# Patient Record
Sex: Female | Born: 1935 | Race: White | Hispanic: No | Smoking: Never smoker
Health system: Southern US, Community
[De-identification: ages and names within clinical notes are randomized; demographics above are authoritative.]

## PROBLEM LIST (undated history)

## (undated) DIAGNOSIS — R319 Hematuria, unspecified: Secondary | ICD-10-CM

## (undated) DIAGNOSIS — R2689 Other abnormalities of gait and mobility: Secondary | ICD-10-CM

## (undated) DIAGNOSIS — E876 Hypokalemia: Secondary | ICD-10-CM

## (undated) DIAGNOSIS — M1990 Unspecified osteoarthritis, unspecified site: Secondary | ICD-10-CM

## (undated) DIAGNOSIS — I5032 Chronic diastolic (congestive) heart failure: Secondary | ICD-10-CM

## (undated) DIAGNOSIS — K219 Gastro-esophageal reflux disease without esophagitis: Secondary | ICD-10-CM

## (undated) DIAGNOSIS — H18009 Unspecified corneal deposit, unspecified eye: Secondary | ICD-10-CM

## (undated) DIAGNOSIS — H1820 Unspecified corneal edema: Secondary | ICD-10-CM

## (undated) DIAGNOSIS — K449 Diaphragmatic hernia without obstruction or gangrene: Secondary | ICD-10-CM

## (undated) DIAGNOSIS — Z961 Presence of intraocular lens: Secondary | ICD-10-CM

## (undated) DIAGNOSIS — I499 Cardiac arrhythmia, unspecified: Secondary | ICD-10-CM

## (undated) DIAGNOSIS — H9190 Unspecified hearing loss, unspecified ear: Secondary | ICD-10-CM

## (undated) DIAGNOSIS — I739 Peripheral vascular disease, unspecified: Secondary | ICD-10-CM

## (undated) DIAGNOSIS — I10 Essential (primary) hypertension: Secondary | ICD-10-CM

## (undated) DIAGNOSIS — M25473 Effusion, unspecified ankle: Secondary | ICD-10-CM

## (undated) DIAGNOSIS — I4821 Permanent atrial fibrillation: Secondary | ICD-10-CM

## (undated) DIAGNOSIS — F419 Anxiety disorder, unspecified: Secondary | ICD-10-CM

## (undated) DIAGNOSIS — Z853 Personal history of malignant neoplasm of breast: Secondary | ICD-10-CM

## (undated) DIAGNOSIS — D500 Iron deficiency anemia secondary to blood loss (chronic): Secondary | ICD-10-CM

## (undated) DIAGNOSIS — R55 Syncope and collapse: Secondary | ICD-10-CM

## (undated) DIAGNOSIS — E785 Hyperlipidemia, unspecified: Secondary | ICD-10-CM

## (undated) DIAGNOSIS — R739 Hyperglycemia, unspecified: Secondary | ICD-10-CM

## (undated) HISTORY — DX: Unspecified corneal edema: H18.20

## (undated) HISTORY — PX: CHOLECYSTECTOMY: SHX55

## (undated) HISTORY — DX: Presence of intraocular lens: Z96.1

## (undated) HISTORY — PX: EYE SURGERY: SHX253

## (undated) HISTORY — PX: TONSILLECTOMY: SUR1361

## (undated) HISTORY — DX: Essential (primary) hypertension: I10

## (undated) HISTORY — PX: ABDOMINAL HYSTERECTOMY: SHX81

## (undated) HISTORY — DX: Unspecified corneal deposit, unspecified eye: H18.009

## (undated) HISTORY — DX: Other abnormalities of gait and mobility: R26.89

## (undated) HISTORY — DX: Hyperglycemia, unspecified: R73.9

## (undated) HISTORY — DX: Hypokalemia: E87.6

## (undated) HISTORY — DX: Diaphragmatic hernia without obstruction or gangrene: K44.9

## (undated) HISTORY — DX: Hyperlipidemia, unspecified: E78.5

## (undated) HISTORY — PX: HEMORRHOID SURGERY: SHX153

## (undated) HISTORY — DX: Anxiety disorder, unspecified: F41.9

## (undated) HISTORY — DX: Syncope and collapse: R55

## (undated) HISTORY — PX: OTHER SURGICAL HISTORY: SHX169

## (undated) HISTORY — DX: Gastro-esophageal reflux disease without esophagitis: K21.9

## (undated) HISTORY — DX: Effusion, unspecified ankle: M25.473

## (undated) HISTORY — DX: Permanent atrial fibrillation: I48.21

## (undated) HISTORY — PX: BREAST BIOPSY: SHX20

## (undated) HISTORY — DX: Iron deficiency anemia secondary to blood loss (chronic): D50.0

## (undated) HISTORY — DX: Unspecified osteoarthritis, unspecified site: M19.90

## (undated) HISTORY — DX: Chronic diastolic (congestive) heart failure: I50.32

## (undated) HISTORY — DX: Hematuria, unspecified: R31.9

---

## 2009-05-03 ENCOUNTER — Observation Stay (HOSPITAL_COMMUNITY): Admission: EM | Admit: 2009-05-03 | Discharge: 2009-05-04 | Payer: Self-pay | Admitting: Emergency Medicine

## 2009-05-03 HISTORY — PX: CATARACT EXTRACTION: SUR2

## 2010-08-23 ENCOUNTER — Encounter (HOSPITAL_COMMUNITY)
Admission: RE | Admit: 2010-08-23 | Discharge: 2010-08-23 | Disposition: A | Discharge status: Home / Self Care | Payer: Medicare Other | Source: Ambulatory Visit | Attending: Orthopaedic Surgery | Admitting: Orthopaedic Surgery

## 2010-08-23 ENCOUNTER — Ambulatory Visit (HOSPITAL_COMMUNITY)
Admission: RE | Admit: 2010-08-23 | Discharge: 2010-08-23 | Disposition: A | Discharge status: Home / Self Care | Payer: Medicare Other | Source: Ambulatory Visit | Attending: Orthopaedic Surgery | Admitting: Orthopaedic Surgery

## 2010-08-23 ENCOUNTER — Other Ambulatory Visit (HOSPITAL_COMMUNITY): Payer: Self-pay | Admitting: Orthopaedic Surgery

## 2010-08-23 DIAGNOSIS — I10 Essential (primary) hypertension: Secondary | ICD-10-CM | POA: Insufficient documentation

## 2010-08-23 DIAGNOSIS — Z01812 Encounter for preprocedural laboratory examination: Secondary | ICD-10-CM | POA: Insufficient documentation

## 2010-08-23 DIAGNOSIS — M1711 Unilateral primary osteoarthritis, right knee: Secondary | ICD-10-CM

## 2010-08-23 DIAGNOSIS — Z01818 Encounter for other preprocedural examination: Secondary | ICD-10-CM | POA: Insufficient documentation

## 2010-08-23 DIAGNOSIS — K449 Diaphragmatic hernia without obstruction or gangrene: Secondary | ICD-10-CM | POA: Insufficient documentation

## 2010-08-23 LAB — COMPREHENSIVE METABOLIC PANEL
ALT: 18 U/L (ref 0–35)
Albumin: 3.7 g/dL (ref 3.5–5.2)
Alkaline Phosphatase: 87 U/L (ref 39–117)
BUN: 20 mg/dL (ref 6–23)
Chloride: 102 mEq/L (ref 96–112)
Glucose, Bld: 91 mg/dL (ref 70–99)
Potassium: 4.1 mEq/L (ref 3.5–5.1)
Sodium: 137 mEq/L (ref 135–145)
Total Bilirubin: 0.7 mg/dL (ref 0.3–1.2)
Total Protein: 6.4 g/dL (ref 6.0–8.3)

## 2010-08-23 LAB — CBC
HCT: 41.4 % (ref 36.0–46.0)
Hemoglobin: 14 g/dL (ref 12.0–15.0)
MCH: 29.5 pg (ref 26.0–34.0)
MCV: 87.2 fL (ref 78.0–100.0)
RBC: 4.75 MIL/uL (ref 3.87–5.11)
WBC: 7.1 10*3/uL (ref 4.0–10.5)

## 2010-08-23 LAB — URINALYSIS, ROUTINE W REFLEX MICROSCOPIC
Bilirubin Urine: NEGATIVE
Glucose, UA: NEGATIVE mg/dL
Ketones, ur: NEGATIVE mg/dL
Nitrite: NEGATIVE
Specific Gravity, Urine: 1.018 (ref 1.005–1.030)
pH: 5.5 (ref 5.0–8.0)

## 2010-08-23 LAB — PROTIME-INR
INR: 0.9 (ref 0.00–1.49)
Prothrombin Time: 12.4 seconds (ref 11.6–15.2)

## 2010-08-23 LAB — TYPE AND SCREEN: Antibody Screen: NEGATIVE

## 2010-08-23 LAB — SURGICAL PCR SCREEN
MRSA, PCR: NEGATIVE
Staphylococcus aureus: POSITIVE — AB

## 2010-08-26 ENCOUNTER — Inpatient Hospital Stay (HOSPITAL_COMMUNITY)
Admission: RE | Admit: 2010-08-26 | Discharge: 2010-08-30 | DRG: 470 | Disposition: A | Discharge status: Skilled Nursing Facility | Payer: Medicare Other | Source: Ambulatory Visit | Attending: Orthopaedic Surgery | Admitting: Orthopaedic Surgery

## 2010-08-26 ENCOUNTER — Inpatient Hospital Stay (HOSPITAL_COMMUNITY): Payer: Medicare Other

## 2010-08-26 DIAGNOSIS — F411 Generalized anxiety disorder: Secondary | ICD-10-CM | POA: Diagnosis present

## 2010-08-26 DIAGNOSIS — M1710 Unilateral primary osteoarthritis, unspecified knee: Principal | ICD-10-CM | POA: Diagnosis present

## 2010-08-26 DIAGNOSIS — K219 Gastro-esophageal reflux disease without esophagitis: Secondary | ICD-10-CM | POA: Diagnosis present

## 2010-08-26 DIAGNOSIS — Z7901 Long term (current) use of anticoagulants: Secondary | ICD-10-CM

## 2010-08-26 DIAGNOSIS — I10 Essential (primary) hypertension: Secondary | ICD-10-CM | POA: Diagnosis present

## 2010-08-26 DIAGNOSIS — I4891 Unspecified atrial fibrillation: Secondary | ICD-10-CM | POA: Diagnosis present

## 2010-08-26 HISTORY — PX: TOTAL KNEE ARTHROPLASTY: SHX125

## 2010-08-27 LAB — CBC
MCHC: 33.1 g/dL (ref 30.0–36.0)
RDW: 14.5 % (ref 11.5–15.5)
WBC: 8.6 10*3/uL (ref 4.0–10.5)

## 2010-08-27 LAB — PROTIME-INR
INR: 1.15 (ref 0.00–1.49)
Prothrombin Time: 14.9 seconds (ref 11.6–15.2)

## 2010-08-27 LAB — BASIC METABOLIC PANEL
CO2: 24 mEq/L (ref 19–32)
Chloride: 103 mEq/L (ref 96–112)
GFR calc Af Amer: 54 mL/min — ABNORMAL LOW (ref >60)
Glucose, Bld: 140 mg/dL — ABNORMAL HIGH (ref 70–99)
Potassium: 3.7 mEq/L (ref 3.5–5.1)
Sodium: 133 mEq/L — ABNORMAL LOW (ref 135–145)

## 2010-08-28 LAB — CBC
HCT: 27.3 % — ABNORMAL LOW (ref 36.0–46.0)
Hemoglobin: 9.1 g/dL — ABNORMAL LOW (ref 12.0–15.0)
MCH: 29 pg (ref 26.0–34.0)
MCHC: 33.3 g/dL (ref 30.0–36.0)
MCV: 86.9 fL (ref 78.0–100.0)

## 2010-08-29 LAB — BASIC METABOLIC PANEL
BUN: 15 mg/dL (ref 6–23)
Chloride: 99 mEq/L (ref 96–112)
Glucose, Bld: 108 mg/dL — ABNORMAL HIGH (ref 70–99)
Potassium: 3.4 mEq/L — ABNORMAL LOW (ref 3.5–5.1)
Sodium: 132 mEq/L — ABNORMAL LOW (ref 135–145)

## 2010-08-29 LAB — CBC
HCT: 26.8 % — ABNORMAL LOW (ref 36.0–46.0)
MCV: 87.3 fL (ref 78.0–100.0)
Platelets: 209 10*3/uL (ref 150–400)
RBC: 3.07 MIL/uL — ABNORMAL LOW (ref 3.87–5.11)
WBC: 6.1 10*3/uL (ref 4.0–10.5)

## 2010-08-29 LAB — PROTIME-INR: INR: 1.97 — ABNORMAL HIGH (ref 0.00–1.49)

## 2010-08-30 LAB — PROTIME-INR: Prothrombin Time: 20.4 seconds — ABNORMAL HIGH (ref 11.6–15.2)

## 2010-09-13 NOTE — Discharge Summary (Signed)
Sara [name], Sara [name]                 ACCOUNT NO.:  [id number]  MEDICAL RECORD NO.:  [id number]           PATIENT TYPE:  I  LOCATION:  5010                         FACILITY:  [facility]  PHYSICIAN:  Jamear Carbonneau C. Ophelia Charter, M.D.    DATE OF BIRTH:  [birth year]  DATE OF ADMISSION:  08/26/2010 DATE OF DISCHARGE:  08/30/2010                              DISCHARGE SUMMARY   ADMISSION DIAGNOSES: 1. Right knee osteoarthritis. 2. History of atrial fibrillation. 3. Hypertension. 4. Anxiety. 5. Gastroesophageal reflux disease.  DISCHARGE DIAGNOSES: 1. Right knee osteoarthritis. 2. History of atrial fibrillation. 3. Hypertension. 4. Anxiety. 5. Gastroesophageal reflux disease. 6. Posthemorrhagic anemia.  PROCEDURE:  On August 26, 2010, the patient underwent right total knee arthroplasty, cemented and computer assisted performed by [employer] C. Ophelia Charter, MD, assisted by Wende Neighbors, [facility] under general anesthesia with femoral nerve block.  CONSULTATIONS:  None.  BRIEF HISTORY:  The patient is an 77 year old female with chronic and progressive right knee pain causing inability to ambulate.  Radiographs have demonstrated significant osteoarthritis.  She has undergone an MRI scan of the right knee showing advanced degenerative disease appearing worst in the anterior aspects of the medial and lateral compartment with extensive subchondral edema.  This scan was in December 2011.  She has been treated conservatively with anti-inflammatory medications as well as analgesics.  Attempts at conservative treatment no longer gave her relief of her symptoms.  It was felt she would benefit from surgical intervention in the form of a total knee arthroplasty and was admitted for the procedure as stated above.  The patient had known cardiac history and therefore was seen by her cardiologist preoperatively for clearance.  Dr. Clarene Duke saw the patient preoperatively and felt her to be a low to moderate risk cardiac patient.   She underwent a preoperative stress test showing her to be a low risk patient.  BRIEF HOSPITAL COURSE:  The patient was admitted and underwent the procedure as stated above.  She tolerated the procedure under general anesthesia without complications.  Femoral nerve block was utilized for pain control.  She was placed on PCA morphine initially; however, due to becoming quite lethargic, she was weaned to p.o. analgesics.  She also developed nausea that was present through much of the hospital stay and treated with multiple antiemetics.  She was on Nexium at bedtime.  The patient was placed on Percocet, which continued to cause her somnolence as well as nausea and then was switched to Vicodin, which she seemed to tolerate better.  The patient was started on physical therapy for range of motion, stretching and strengthening exercises of the right knee. CPM was utilized for passive range of motion.  She was able to participate with the physical therapy and the exercises as well as ambulation.  She was slow to progress with ambulation and was felt to be a suitable candidate for inpatient rehabilitation.  She did prefer to go to [facility] for rehabilitation and the case managers assisted in the necessary paperwork for the transfer.  Prior to discharge, the patient was ambulating approximately 6 feet with the physical therapist.  She was weightbearing as tolerated on the operative extremity and did require a walker for ambulation.  Knee immobilizer used for ambulation only.  Dressing changes were done daily, and the patient's wound was healing without drainage or other signs of infection.  The patient was placed on Lovenox and Coumadin postoperatively.  As her INR became therapeutic, her Lovenox was discontinued.  The adjustments in Coumadin dose were made according to the pro times by the pharmacist at [facility].  The patient was able to take a regular diet despite the fact  that she had nausea.  The antiemetics did seem to help as did the Nexium.  The patient was afebrile and vital signs were stable.  She was voiding well after discontinuation of her Foley catheter.  PERTINENT LABORATORY VALUES:  At discharge, INR 1.73.  Hemoglobin and hematocrit 8.8 and 26.8 respectively.  Chemistry studies on August 29, 2010 showed sodium 132, potassium 3.4, chloride 99, CO2 of 27, glucose 108, BUN 15, creatinine 0.87, and calcium 8.1.  PLAN:  On August 30, 2010, the patient was felt stable for transfer to [facility] for continuation of her orthopedic care and physical therapy.  She should continue to receive physical therapy on a daily basis for passive range of motion, active range of motion, strengthening and stretching exercises to the right knee.  She will continue to receive ambulation and gait training for weightbearing as tolerated to the right lower extremity utilizing a walker for ambulation and her knee immobilizer for ambulation.  The knee immobilizer will be discontinued when the patient can do straight leg raise.  She should receive dressing changes on an as needed or daily basis.  Tegaderm should be utilized over the knee with a 4 x 4 and avoidance of tape products, if possible.  Occupational therapy to assist with ADLs as the patient does plan to be discharged to her home on an independent basis when stable.  She will continue on a regular diet.  MEDICATIONS AT DISCHARGE:  Coumadin, which will be continued by the Pharmacy and physicians at the nursing facility.  She will be on Coumadin for a total of 4 weeks for DVT prophylaxis.  She was not on anticoagulant prior to surgery for her AFib, but other agents were utilized.  Other pertinent medications: 1. Senokot 1/2 hour before meals b.i.d. 2. Sodium chloride ophthalmic drops each eye b.i.d. 3. Benicar 20 mg daily. 4. Coreg 6.25 mg at bedtime. 5. Hydrochlorothiazide 25 mg daily. 6.  Hydralazine 50 mg daily. 7. Amiodarone 100 mg daily. 8. Nexium 40 mg p.o. at bedtime. 9. MiraLax 17 g daily in 8 ounces of water. 10.Tylenol 650 mg 1 every 4-6 hours as needed for mild pain. 11.Vicodin 1-2 every 4-6 hours as needed for moderate pain. 12.Robaxin 500 mg 1 every 8 hours as needed for spasm. 13.Tranxene 3.75 mg 1 p.o. q.8 h p.r.n. anxiety.  The patient should follow up with Dr. Ophelia Charter' office 2 weeks from the date of surgery.  If there are questions or concerns regarding her orthopedic status, please notify Dr. Ophelia Charter' office at [phone].  CONDITION ON DISCHARGE:  Stable.     Wende Neighbors, P.A.   ______________________________ Veverly Fells Ophelia Charter, M.D.    SMV/MEDQ  D:  08/30/2010  T:  08/30/2010  Job:  [id number]  Electronically Signed by Maud Deed P.A. on 09/02/2010 02:05:01 PM Electronically Signed by Annell Greening M.D. on 09/13/2010 05:58:32 PM

## 2010-09-13 NOTE — Op Note (Signed)
Sara [name], Sara [name]                 ACCOUNT NO.:  [id number]  MEDICAL RECORD NO.:  [id number]           PATIENT TYPE:  I  LOCATION:  5010                         FACILITY:  [facility]  PHYSICIAN:  Miranda Frese C. [name], M.D.    DATE OF BIRTH:  March 29, 1926  DATE OF PROCEDURE:  08/26/2010 DATE OF DISCHARGE:                              OPERATIVE REPORT   PREOPERATIVE DIAGNOSIS:  Right knee osteoarthritis.  POSTOPERATIVE DIAGNOSIS:  Right knee osteoarthritis  PROCEDURE:  Right total knee arthroplasty, cemented, computer assist.  SURGEON:  Ruger Saxer C. Ophelia Charter, MD  ASSISTANT:  Wende Neighbors, PA, medically necessary for the procedure and present for the entire procedure.  TOURNIQUET TIME:  An hour and eight minutes.  ESTIMATED BLOOD LOSS:  Minimal.  ANESTHESIA:  GOT plus preoperative femoral nerve block.  PROCEDURE:  After induction of general anesthesia, preoperative femoral nerve block, Ancef prophylaxis, proximal thigh tourniquet, lateral post in line with the tourniquet and a heel bump for the knee to be flexed at 9 degrees, prepping and draping was performed with DuraPrep down the ankle with usual impervious stockinette, Coban, sterile skin marker, Betadine Vi-Drape was applied after split sheets drapes.  Final splits were applied after Vi-Drape and skin was sealed.  Time-out procedure was completed.  Leg was wrapped in Esmarch, tourniquet inflated.  Midline incision was made, superficial retinaculum was divided.  Medial deep retinacular incision was made splitting the quad tendon between the medial one-third and lateral two-third.  Patella flipped over, 9 mm removed off the patella.  Pins were placed inside incision in the femur and stab incision, spreading with hemostat and bicortical pins for computer assist made in the tibia, computer models were generated.  Bone was small, size #2 was suggested.  A 9 mm was taken off the femur which was cut first.  Anterior-posterior cuts were made,  piano sign was appropriate.  No posterior spurs were present.  Box cut was made later, and the tibia was sized for a 2 and 9 mm removed off the tibia.  There was a posterolateral defect in the tibia and an additional 3 mm was removed putting pins lower and then sliding the guide down after moving the upper pins checking with verification with computer.  Keel hole was made with the wings and then box cut was made on the femur and trials inserted.  A 12-mm insert gave full extension.  Flexion, extension, medial and lateral balance was less than a millimeter difference.  There was full extension, flexion at 130 degrees, good patellar tracking. Patella was all poly with three pegs.  Lug holes were drilled in the femur.  Pulsatile lavage after removal of the trials and then prosthesis was cemented in tibia first followed by femur poly insert and patella. Excellent alignment and then computer bicortical pins were removed. Compression was held, cement was hard at 15 minutes.  Tourniquet was deflated.  Hemostasis was obtained and then standard closure with interrupted Ethibond in deep retinaculum, 2-0 Vicryl in the superficial retinaculum, subcutaneous tissue, and skin closure.  Instrument count and needle count were correct.  The patient tolerated the  procedure well and was transferred to recovery room in stable condition.     Sara [name], M.D.     MCY/MEDQ  D:  08/26/2010  T:  08/27/2010  Job:  [id number]  Electronically Signed by Annell Greening M.D. on 09/13/2010 05:58:37 PM

## 2010-09-21 LAB — CBC
HCT: 39.5 % (ref 36.0–46.0)
Hemoglobin: 13.5 g/dL (ref 12.0–15.0)
RBC: 4.37 MIL/uL (ref 3.87–5.11)
RDW: 14 % (ref 11.5–15.5)
WBC: 9.3 10*3/uL (ref 4.0–10.5)

## 2010-09-21 LAB — URINALYSIS, ROUTINE W REFLEX MICROSCOPIC
Hgb urine dipstick: NEGATIVE
Specific Gravity, Urine: 1.023 (ref 1.005–1.030)
Urobilinogen, UA: 1 mg/dL (ref 0.0–1.0)

## 2010-09-21 LAB — POCT CARDIAC MARKERS
CKMB, poc: 2.1 ng/mL (ref 1.0–8.0)
Troponin i, poc: <0.05 ng/mL (ref 0.00–0.09)
Troponin i, poc: <0.05 ng/mL (ref 0.00–0.09)

## 2010-09-21 LAB — DIFFERENTIAL
Eosinophils Relative: 1 % (ref 0–5)
Lymphocytes Relative: 9 % — ABNORMAL LOW (ref 12–46)
Lymphs Abs: 0.9 10*3/uL (ref 0.7–4.0)
Monocytes Absolute: 0.6 10*3/uL (ref 0.1–1.0)

## 2010-09-21 LAB — COMPREHENSIVE METABOLIC PANEL
ALT: 20 U/L (ref 0–35)
Alkaline Phosphatase: 92 U/L (ref 39–117)
BUN: 16 mg/dL (ref 6–23)
CO2: 25 mEq/L (ref 19–32)
Chloride: 110 mEq/L (ref 96–112)
GFR calc non Af Amer: 49 mL/min — ABNORMAL LOW (ref >60)
Glucose, Bld: 145 mg/dL — ABNORMAL HIGH (ref 70–99)
Potassium: 3.1 mEq/L — ABNORMAL LOW (ref 3.5–5.1)
Sodium: 142 mEq/L (ref 135–145)
Total Bilirubin: 0.6 mg/dL (ref 0.3–1.2)

## 2010-09-21 LAB — URINE CULTURE: Culture: NO GROWTH

## 2010-09-21 LAB — URINE MICROSCOPIC-ADD ON

## 2011-02-22 DIAGNOSIS — R55 Syncope and collapse: Secondary | ICD-10-CM

## 2011-02-22 HISTORY — DX: Syncope and collapse: R55

## 2013-02-24 ENCOUNTER — Encounter: Payer: Self-pay | Admitting: Cardiology

## 2013-02-24 ENCOUNTER — Ambulatory Visit: Payer: Medicare Other | Admitting: Cardiology

## 2013-02-25 ENCOUNTER — Telehealth: Payer: Self-pay | Admitting: Cardiology

## 2013-02-25 NOTE — Telephone Encounter (Signed)
Pharmacy has faxed refill request twice and still has not heard back from [place].  Patient only has one pill left.  Amiodarone HCL 200 mg.

## 2013-02-26 MED ORDER — AMIODARONE HCL 200 MG PO TABS: 200.0000 mg | ORAL_TABLET | Freq: Every day | ORAL | Status: DC

## 2013-02-26 NOTE — Telephone Encounter (Signed)
Returned call.  Pt informed message received and refill will be sent to pharmacy for 30-days as she missed her f/u appt.  Pt stated she was sick and had to cancel Monday.  Appt rescheduled for 9.30.14 at 11am w/ Dr. Herbie Baltimore per pt request, needing an appt on Mon or Tues.  Pt informed refills will be sent to pharmacy.  Pt verbalized understanding and agreed w/ plan.  Refill(s) sent to pharmacy.

## 2013-03-18 ENCOUNTER — Encounter: Payer: Self-pay | Admitting: Cardiology

## 2013-03-18 ENCOUNTER — Ambulatory Visit (INDEPENDENT_AMBULATORY_CARE_PROVIDER_SITE_OTHER): Payer: Medicare Other | Admitting: Cardiology

## 2013-03-18 VITALS — BP 168/86 | HR 61 | Ht 60.0 in | Wt 156.5 lb

## 2013-03-18 DIAGNOSIS — E669 Obesity, unspecified: Secondary | ICD-10-CM | POA: Insufficient documentation

## 2013-03-18 DIAGNOSIS — I10 Essential (primary) hypertension: Secondary | ICD-10-CM

## 2013-03-18 DIAGNOSIS — I4891 Unspecified atrial fibrillation: Secondary | ICD-10-CM

## 2013-03-18 DIAGNOSIS — I480 Paroxysmal atrial fibrillation: Secondary | ICD-10-CM

## 2013-03-18 MED ORDER — AMLODIPINE-VALSARTAN-HCTZ 10-160-25 MG PO TABS: 1016025.0000 mg | ORAL_TABLET | Freq: Every day | ORAL | Status: DC

## 2013-03-18 NOTE — Progress Notes (Signed)
PCP: Provider Not In System  Clinic Note: Chief Complaint  Patient presents with  . 6 month visit    fell last friday-hurt right lateral chest , no chest pain , no sob, no edema   HPI: Sara [name] is a 79 y.o. female with a PMH below who presents today for 6-8 month followup.  She is a long-term patient of Dr. Caprice Kluver, who I saw for the first time back in January.  She is a distant history of the proximal major fibrillation controlled on amiodarone but not an evaluation due to frequent falls.  She simply has a lot of unsteadiness of gait with knee pain and significant knee surgeries.  She also is relatively labile difficult to control blood pressure.  She's had renal artery Dopplers performed which were negative.  During her last visit I reduced her carvedilol to 6.25 mg twice a day because of fatigue.  That has improved.  The hydralazine was discontinued and she was put on amlodipine.  She is not a combination amlodipine/Diovan/HCTZ  Interval History: Today she presents feeling fine early mid complaint is that she fell were taken her groceries out of her car and had too much weight in the bag and fell backwards.  She bruised her right upper rib cage.  She does note on occasion feeling dizzy, didn't describe any sort of "I feeling of lightheadedness that lasts a few seconds.  She also l notes some rapid heart palpitations but nothing like when she was in A. fib.  Nothing more than a couple seconds.  She denies any syncope or near-syncope.  She did night any prolonged rapid heart rates.  She fell that she did not have loss of consciousness and it was not because of dizziness.  Besides or rib cage pain in her chest she denies any chest pain with rest or exertion  Although she has labile blood pressures as high as the 200 mmHg systolic range, she denies any headaches or blurred vision her TIA/amaurosis fugax symptoms.  No dyspnea associated with her chest discomfort.  The remainder of Cardiovascular  ROS: no chest pain or dyspnea on exertion negative for - edema, loss of consciousness, murmur, orthopnea, paroxysmal nocturnal dyspnea, rapid heart rate or shortness of breath: Additional cardiac review of systems: Melena - no, hematochezia no; hematuria - no; nosebleeds - no; claudication - no  Past Medical History  Diagnosis Date  . Anxiety   . Balance problem     Due to weakened knee  . Ankle edema     Mild, which is intermittent, usually mildly dependent, left slightly greater than the right.  . Arthritis     In her knees  . GERD (gastroesophageal reflux disease)   . Posthemorrhagic anemia   . Hiatal hernia     With GERD  . Hyperlipidemia   . Hypokalemia   . Hyperglycemia     Random, without any history of diabetes  . 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 Developed after cataract surgery 05/03/09  . Pseudophakia   . 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 Hematuria     Resolved after coumadin was stopped  . Paroxysmal atrial fibrillation     On amiodarone but not on anticoagulant  . H/O echocardiogram     Echo 03/01/11 EF = >55%. Shows normal systolic function with mild to moderate LV hypertrophy. Left atrial dimension 3.8 cm & a pulmonary artery pressure of  38. There was breakthrough diastolic dysfunction.  . Syncope 02/22/11    During OV on 02/22/11 her INR was 7.3 and was given 2.5 mg of vitamin K. Later that day she became diaphoretic & fainted.  . Hypertension     Difficult to control. Renal Doppler 06/02/10 showed less that 60% bilateral renal artery narrowing.   Prior Cardiac Evaluation and Past Surgical History: Reviewed on Epic  Allergies  Allergen Reactions  . Benicar [Olmesartan]     Unclear of the intolerance  . Bystolic [Nebivolol Hcl]   . Clonidine Derivatives   . Exforge [Amlodipine Besylate-Valsartan] Other (See Comments)    Unclear  . Hctz [Hydrochlorothiazide]     Currently taking without problem; question if  this has to do with hypokalemia  . Hydralazine Other (See Comments)    Also tolerated, but had orthostatic changes on standing medication  . Lisinopril   . Multaq [Dronedarone] Nausea And Vomiting  . Rythmol [Propafenone]   . Statins   . Tekturna [Aliskiren]    Despite allergies listed for ACE inhibitor, ARB's and HCTZ etc., she is currently taking an ARB/HCTZ component medication that also has amlodipine listed in Exforge --> clearly she is tolerating these without problem.  Current Outpatient Prescriptions  Medication Sig Dispense Refill  . Acetaminophen (TYLENOL PO) Take by mouth daily as needed.      Marland Kitchen amiodarone (PACERONE) 200 MG tablet Take 1 tablet (200 mg total) by mouth daily.  30 tablet  5  . aspirin 81 MG tablet Take 81 mg by mouth daily.      . carvedilol (COREG) 6.25 MG tablet Take 6.25 mg by mouth daily.      . Cholecalciferol (VITAMIN D3) 1000 UNITS CAPS Take by mouth.      . clorazepate (TRANXENE) 3.75 MG tablet Take 3.75 mg by mouth as needed for anxiety.      Marland Kitchen esomeprazole (NEXIUM) 40 MG capsule Take 40 mg by mouth daily before breakfast.      . Omega-3 Fatty Acids (FISH OIL) 1000 MG CAPS Take by mouth.      . Red Yeast Rice 600 MG CAPS Take by mouth.      . Amlodipine-Valsartan-HCTZ 10-160-25 MG TABS Take 1,016,025 mg by mouth daily.  30 tablet  11   No current facility-administered medications for this visit.    Amlodipine-Valsartan-HCTZ dose for today's visit was 5-160-25 mg  History   Social History Narrative   She is a widowed mother of one and grandmother of one.  She does not exercise.  She does not smoke and does not drink alcohol.    ROS: A comprehensive Review of Systems - Negative except The pertinence and as noted above.  She has mild swelling and some spider veins but nothing that bothers her.  PHYSICAL EXAM BP 168/86  Pulse 61  Ht 5' (1.524 m)  Wt 156 lb 8 oz (70.988 kg)  BMI 30.56 kg/m2 General appearance: alert, cooperative, appears stated  age, no distress, mildly obese and Very well groomed and well-nourished.  Pleasant mood and affect.  Oriented x3 and answers questions appropriately. Neck: no adenopathy, no carotid bruit, no JVD and supple, symmetrical, trachea midline Lungs: clear to auscultation bilaterally, normal percussion bilaterally and Nonlabored but definite splinting of the right chest wall with deep inspiration. Heart: regular rate and rhythm, S1, S2 normal, no murmur, click, rub or gallop and normal apical impulse Abdomen: soft, non-tender; bowel sounds normal; no masses,  no organomegaly Extremities: extremities normal, atraumatic, no  cyanosis or edema, varicose veins noted and Spider veins noted Neurologic: Grossly normal  [id number] today: Yes Rate: 61 , Rhythm: NSR, LVH with repolarization abnormalities;  no significant change  Recent Labs:  None.  ASSESSMENT / PLAN: Hypertension goal BP (blood pressure) < 150/90 I listed a relatively lenient goal for her blood pressure.  I think that if we try to get to aggressively treat her blood pressure she is to have more orthostatic symptoms.  She or he has enough in the way of problems following. My back and upper beta blocker she felt less fatigued.  I think the problem hydralazine was rapid on the symptoms from it.  We can use that is simply a when necessary medication for blood pressures in the 200 mm mercury range.  I don't think up to 200 mm mercury to on her 60 mmHg will be that of her problem. Her baseline blood pressure is too high, lasts only increased her Diovan dose and that did not work well.  Mitomycin now is increase amlodipine as is the one medication that I have not yet seen for her.    Plan: Increase her dose to 10/160/25 mg of the combination medication. Use Hydralazine is when necessary medication for blood pressures greater than 200 mmHg  PAF (paroxysmal atrial fibrillation) She continued to be well-controlled at both rhythm and rate and atrial  standpoint on amiodarone.  She also has a carvedilol for additional rate control. Thankfully, as far as I can tell she has not had any recurrences.  With that in mind, I feel better about her not being on anticoagulation since she clearly has had far too many falls to feel safe.  Obesity (BMI 30-39.9) Talk a little bit about dietary modification, and trying to get out and walk around more.  She can laugh at me and I think played lip service to my comments.   Orders Placed This Encounter  Procedures  . EKG 12-Lead   Meds ordered this encounter  Medications  . Amlodipine-Valsartan-HCTZ 10-160-25 MG TABS    Sig: Take 1,016,025 mg by mouth daily.    Dispense:  30 tablet    Refill:  11    Followup: One year  Cicily Bonano W. Herbie Baltimore, M.D., M.S. [facility] [address] [place], [place]  [address]  [phone] Pager # [phone]

## 2013-03-18 NOTE — Assessment & Plan Note (Signed)
Talk a little bit about dietary modification, and trying to get out and walk around more.  She can laugh at me and I think played lip service to my comments.

## 2013-03-18 NOTE — Patient Instructions (Addendum)
You seem to be doing well.  I am a bit concerned that your Blood Pressure keeps going up into the ~276mmHg range.  I am going to make a little change with your combination medication (increasing the dose of one of the components) -- just finish your current bottle of the (combination) Amlodipine-Valsartan-HCTZ pills - then get the new Rx filled to replace the current dose. (new dose 10-160-25).  I also want you to use the Hydralazine that you have on an AS NEEDED BASIS - take 1 tab as needed if BP is > 180 mmHg (take ~4 yrs after the Combination medication)  Marykay Lex, MD

## 2013-03-18 NOTE — Assessment & Plan Note (Addendum)
I listed a relatively lenient goal for her blood pressure.  I think that if we try to get to aggressively treat her blood pressure she is to have more orthostatic symptoms.  She or he has enough in the way of problems following. My back and upper beta blocker she felt less fatigued.  I think the problem hydralazine was rapid on the symptoms from it.  We can use that is simply a when necessary medication for blood pressures in the 200 mm mercury range.  I don't think up to 200 mm mercury to on her 60 mmHg will be that of her problem. Her baseline blood pressure is too high, lasts only increased her Diovan dose and that did not work well.  Mitomycin now is increase amlodipine as is the one medication that I have not yet seen for her.    Plan: Increase her dose to 10/160/25 mg of the combination medication. Use Hydralazine is when necessary medication for blood pressures greater than 200 mmHg

## 2013-03-18 NOTE — Assessment & Plan Note (Signed)
She continued to be well-controlled at both rhythm and rate and atrial standpoint on amiodarone.  She also has a carvedilol for additional rate control. Thankfully, as far as I can tell she has not had any recurrences.  With that in mind, I feel better about her not being on anticoagulation since she clearly has had far too many falls to feel safe.

## 2013-03-24 ENCOUNTER — Encounter: Payer: Self-pay | Admitting: Cardiology

## 2013-08-25 ENCOUNTER — Other Ambulatory Visit: Payer: Self-pay | Admitting: *Deleted

## 2013-08-25 MED ORDER — AMIODARONE HCL 200 MG PO TABS: 200.0000 mg | ORAL_TABLET | Freq: Every day | ORAL | Status: DC

## 2013-08-25 NOTE — Telephone Encounter (Signed)
Rx was sent to pharmacy electronically. 

## 2013-08-26 ENCOUNTER — Other Ambulatory Visit: Payer: Self-pay | Admitting: *Deleted

## 2013-08-26 NOTE — Telephone Encounter (Signed)
She is saying that she is not able to take amlodipine. The plan for additional blood pressure control I saw her her blood pressure was in the 170s. If her blood pressures at home not on amlodipine (which means she is not taking the medication as prescribed) are indeed below 032 mmHg systolic, I am fine but not in the amlodipine component. Unfortunately, I feel that she will need additional blood pressure medication in the future, and may need to be seen for blood pressure evaluation/PA NP check and a reasonable midway interval between her last visit in our clinic visit with me.  Leonie Man, MD

## 2013-08-26 NOTE — Telephone Encounter (Signed)
Spoke with patient regarding medication. Noted medication is amlodipine-valsartan-hctz and refill request for valsartan-hctz.. Patient stated she cannot take previously stated medication (no particular reason noted), reports BPs have been stable/good.. Will defer to Dr. Ellyn Hack

## 2013-08-27 MED ORDER — VALSARTAN-HYDROCHLOROTHIAZIDE 160-25 MG PO TABS: 1.0000 | ORAL_TABLET | Freq: Every day | ORAL | Status: DC

## 2013-08-27 NOTE — Telephone Encounter (Signed)
Sounds OK.  Sara Man, MD

## 2013-08-27 NOTE — Telephone Encounter (Signed)
Called patient to inquire of home BPs - which run about 110R-159Y systolic. Informed of advice per Dr Ellyn Hack and requested that she make an appmt with an extender to f/up on BP between now and when recall is for Dr Ellyn Hack visit in Sept 2015. Agreed with plan.   Patient had been prescribed Amlodipine-Valsartan-HCTZ 10-160-25 MG TABS - but stopped taking on her own.    Rx was sent to pharmacy electronically.

## 2013-08-28 ENCOUNTER — Other Ambulatory Visit: Payer: Self-pay | Admitting: *Deleted

## 2013-09-05 ENCOUNTER — Telehealth: Payer: Self-pay | Admitting: *Deleted

## 2013-09-25 ENCOUNTER — Ambulatory Visit: Payer: Medicare Other | Admitting: Cardiology

## 2013-10-20 NOTE — Telephone Encounter (Signed)
Encounter Closed---10/20/13 TP 

## 2013-10-21 ENCOUNTER — Telehealth: Payer: Self-pay | Admitting: *Deleted

## 2013-10-21 NOTE — Telephone Encounter (Signed)
I called patient to schedule her BP check that she had cancelled and the pt stated she can not come to [facility] to get her BP checked. She checks it at home and she will just see Dr. Ellyn Hack in September.

## 2013-12-31 ENCOUNTER — Telehealth: Payer: Self-pay | Admitting: Cardiology

## 2013-12-31 NOTE — Telephone Encounter (Signed)
I don't thing she needs to come in. I don't think we need to delay her surgery. She is not on any anticoagulation. We will be readily available for consultation while she in the hospital.

## 2013-12-31 NOTE — Telephone Encounter (Signed)
Does this pt. Need to see you

## 2013-12-31 NOTE — Telephone Encounter (Signed)
Pt says she is going to have cancer surgery. She wants to know if Dr Ellyn Hack should see her first?

## 2014-01-02 NOTE — Telephone Encounter (Signed)
Pt. Called and informed of Dr. Darcus Pester instructions that she did not need to come to be seen

## 2014-02-20 ENCOUNTER — Ambulatory Visit: Payer: Medicare Other | Admitting: Physician Assistant

## 2014-03-18 ENCOUNTER — Encounter: Payer: Self-pay | Admitting: Physician Assistant

## 2014-03-23 ENCOUNTER — Telehealth: Payer: Self-pay | Admitting: Cardiology

## 2014-03-23 MED ORDER — AMIODARONE HCL 200 MG PO TABS: 200.0000 mg | ORAL_TABLET | Freq: Every day | ORAL | Status: DC

## 2014-03-23 MED ORDER — VALSARTAN-HYDROCHLOROTHIAZIDE 160-25 MG PO TABS: 1.0000 | ORAL_TABLET | Freq: Every day | ORAL | Status: DC

## 2014-03-23 NOTE — Telephone Encounter (Signed)
Recently had surgery for breast cancer. Oncologists are determining if she needs chemo and/or radiation.  Refills sent in for Valsartan HCTZ and Amiodarone.  She will make an appt as soon as she can for her yearly visit.

## 2014-03-23 NOTE — Telephone Encounter (Signed)
Pt says she still need her medicine. Please call in her Valsartan HCTZ 160 /12.5 mg #30, Amiodarone HCL 200 mg #30. Please call to [phone].She says she had to cancel appt,because she was dealing with cancer.

## 2014-06-01 ENCOUNTER — Ambulatory Visit (INDEPENDENT_AMBULATORY_CARE_PROVIDER_SITE_OTHER): Payer: Commercial Managed Care - HMO | Admitting: Cardiology

## 2014-06-01 ENCOUNTER — Encounter: Payer: Self-pay | Admitting: Cardiology

## 2014-06-01 VITALS — BP 235/90 | HR 62 | Ht 60.0 in | Wt 151.4 lb

## 2014-06-01 DIAGNOSIS — I480 Paroxysmal atrial fibrillation: Secondary | ICD-10-CM

## 2014-06-01 DIAGNOSIS — E669 Obesity, unspecified: Secondary | ICD-10-CM

## 2014-06-01 DIAGNOSIS — I10 Essential (primary) hypertension: Secondary | ICD-10-CM

## 2014-06-01 MED ORDER — VALSARTAN-HYDROCHLOROTHIAZIDE 320-25 MG PO TABS: 1.0000 | ORAL_TABLET | Freq: Every day | ORAL | Status: DC

## 2014-06-01 NOTE — Assessment & Plan Note (Signed)
Blood pressure not adequately controlled today. She is is at home which usually in the 1:30 to 140 range. I have a hard time believing that. Can increase it ARB to 320 mg a valsartan less than 25 of HCTZ in addition to carvedilol.  Goal systolic blood pressure ranges roughly 150 mmHg to avoid falls.

## 2014-06-01 NOTE — Patient Instructions (Signed)
STOP VALSARTAN HCT 160/12.5 MG. You may take 2 tablets a day at the same time (LUNCH) UNTIL FINISH AND THE START THE MEDICATIONS     START VALSARTAN HCT 320/25 MG TAKE DAILY AT LUNCH TIME    Your physician wants you to follow-up in 6 MONTHS (JUNE 2016) with Dr Ellyn Hack. You will receive a reminder letter in the mail two months in advance. If you don't receive a letter, please call our office to schedule the follow-up appointment.

## 2014-06-01 NOTE — Assessment & Plan Note (Addendum)
As far as I can tell she has not had any recurrences while on amiodarone therapy. She's been on it so long that we don't have any baseline readings for her. She should have her LFTs and TSH checked. We will check to see if these of an recently checked by her PCP. If not, we will go in order those labs in addition to lipid panel just for screening.   No adverse effects of amiodarone noted. She is on low-dose of carvedilol for additional rate control if she does go in A. Fib. Not anticoagulated as she is not having recurrences and had a history of falls in the past.

## 2014-06-01 NOTE — Assessment & Plan Note (Signed)
The patient understands the need to lose weight with diet and exercise. We have discussed specific strategies for this.  

## 2014-06-01 NOTE — Progress Notes (Signed)
PCP: PROVIDER NOT IN SYSTEM  Clinic Note: Chief Complaint  Patient presents with  . Annual Exam    left breast cancer surgery 02/2014, weak since surgery,no chest pain, no sob , no edema  . Atrial Fibrillation    HPI: Sara [name] is a 80 y.o. female with a PMH below who presents today for annual follow-up of PAF and hypertension. She is a former long-term patient of Dr. Aldona Bar, whom I saw for the first time in January 2014. She has a distant history of PAF that has been controlled on amiodarone for quite some time. She is not on anticoagulation because of falls in the past. She is also not had any recurrence. I last saw her in September 2014. She was supposed to see me September this year, however that was postponed due to her hospitalization for lumpectomy of a right sided breast cancer nodule.  Past Medical History  Diagnosis Date  . Anxiety   . Balance problem     Due to weakened knee  . Ankle edema     Mild, which is intermittent, usually mildly dependent, left slightly greater than the right.  . Arthritis     In her knees  . GERD (gastroesophageal reflux disease)   . Posthemorrhagic anemia   . Hiatal hernia     With GERD  . Hyperlipidemia   . Hypokalemia   . Hyperglycemia     Random, without any history of diabetes  . 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 Developed after cataract surgery 05/03/09  . Pseudophakia   . 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 Hematuria     Resolved after coumadin was stopped  . Paroxysmal atrial fibrillation     On amiodarone but not on anticoagulant  . H/O echocardiogram     Echo 03/01/11 EF = >55%. Shows normal systolic function with mild to moderate LV hypertrophy. Left atrial dimension 3.8 cm & a pulmonary artery pressure of 38. There was breakthrough diastolic dysfunction.  . Syncope 02/22/11    During OV on 02/22/11 her INR was 7.3 and was given 2.5 mg of vitamin K. Later that day she became  diaphoretic & fainted.  . Hypertension     Difficult to control. Renal Doppler 06/02/10 showed less that 60% bilateral renal artery narrowing.   Interval History: Since I last saw her, she has had the diagnosis of breast cancer that was "about the size of a dime ". This was taken out with a lumpectomy without radical dissection. She is on oral suppression medicine/chemotherapy. For the cardiac standpoint goes she denies any significant episodes suggest recurrence of A. fib. She does have some occasional fluttering sensations it lasts a few seconds. Mild lower TIMI edema after being on her feet for long time but no PND or orthopnea. No resting or exertional chest tightness or pressure. She does get some orthostatic dizziness in the morning but has not had any syncope or near-syncope type symptoms. No presyncope. No TIA or amaurosis fugax. She is on aspirin and denies any melena, hematochezia or hematuria.  ROS: A comprehensive was performed. Review of Systems  Constitutional: Positive for malaise/fatigue (still not quite  recuperated from her lumpectomy. Also may be having some symptoms from her oral chemotherapy medication).  HENT: Negative for nosebleeds.   Cardiovascular: Positive for palpitations (Rare, intermittent). Negative for claudication.  Gastrointestinal: Negative for constipation, blood in stool and melena.  Genitourinary:  Negative for hematuria.  Musculoskeletal: Positive for joint pain.  Neurological: Positive for dizziness (usually positional in the morning after taking blood pressure medications) and tingling (Hands and feet get numb).  Endo/Heme/Allergies: Does not bruise/bleed easily.  Psychiatric/Behavioral: Negative for depression (Still somewhat down about the loss of her sister (Mrs. Orland Penman) last year from pancreatic cancer) and memory loss. The patient is not nervous/anxious.   All other systems reviewed and are negative.   Current Outpatient Prescriptions on File Prior to  Visit  Medication Sig Dispense Refill  . amiodarone (PACERONE) 200 MG tablet Take 1 tablet (200 mg total) by mouth daily. 30 tablet 2  . aspirin 81 MG tablet Take 81 mg by mouth daily.    . carvedilol (COREG) 6.25 MG tablet Take 6.25 mg by mouth daily.    . Cholecalciferol (VITAMIN D3) 1000 UNITS CAPS Take by mouth.    . clorazepate (TRANXENE) 3.75 MG tablet Take 3.75 mg by mouth as needed for anxiety.    Marland Kitchen esomeprazole (NEXIUM) 40 MG capsule Take 40 mg by mouth daily before breakfast.    . Omega-3 Fatty Acids (FISH OIL) 1000 MG CAPS Take by mouth.    . Red Yeast Rice 600 MG CAPS Take by mouth.     No current facility-administered medications on file prior to visit.   Allergies  Allergen Reactions  . Benicar [Olmesartan]     Unclear of the intolerance  . Bystolic [Nebivolol Hcl]   . Clonidine Derivatives   . Exforge [Amlodipine Besylate-Valsartan] Other (See Comments)    Unclear  . Hctz [Hydrochlorothiazide]     Currently taking without problem; question if this has to do with hypokalemia  . Hydralazine Other (See Comments)    Also tolerated, but had orthostatic changes on standing medication  . Lisinopril   . Multaq [Dronedarone] Nausea And Vomiting  . Rythmol [Propafenone]   . Statins   . Tekturna [Aliskiren]    Current Outpatient Prescriptions on File Prior to Visit  Medication Sig Dispense Refill  . amiodarone (PACERONE) 200 MG tablet Take 1 tablet (200 mg total) by mouth daily. 30 tablet 2  . aspirin 81 MG tablet Take 81 mg by mouth daily.    . carvedilol (COREG) 6.25 MG tablet Take 6.25 mg by mouth daily.    . Cholecalciferol (VITAMIN D3) 1000 UNITS CAPS Take by mouth.    . clorazepate (TRANXENE) 3.75 MG tablet Take 3.75 mg by mouth as needed for anxiety.    Marland Kitchen esomeprazole (NEXIUM) 40 MG capsule Take 40 mg by mouth daily before breakfast.    . Omega-3 Fatty Acids (FISH OIL) 1000 MG CAPS Take by mouth.    . Red Yeast Rice 600 MG CAPS Take by mouth.     No current  facility-administered medications on file prior to visit.   SOCIAL AND FAMILY HISTORY REVIEWED IN EPIC -- nO change  Wt Readings from Last 3 Encounters:  06/01/14 151 lb 6.4 oz (68.675 kg)  03/18/13 156 lb 8 oz (70.988 kg)   PHYSICAL EXAM BP 235/90 mmHg  Pulse 62  Ht 5' (1.524 m)  Wt 151 lb 6.4 oz (68.675 kg)  BMI 29.57 kg/m2 General appearance: alert, cooperative, appears stated age, no distress, mildly obese and Very well groomed and well-nourished. Pleasant mood and affect. Oriented x3 and answers questions appropriately. Neck: no adenopathy, no carotid bruit, no JVD and supple, symmetrical, trachea midline Lungs: clear to auscultation bilaterally, normal percussion bilaterally and Nonlabored but definite splinting of the right chest  wall with deep inspiration. Heart: regular rate and rhythm, S1, S2 normal, no murmur, click, rub or gallop and normal apical impulse Abdomen: soft, non-tender; bowel sounds normal; no masses, no organomegaly Extremities: extremities normal, atraumatic, no cyanosis or edema, varicose veins noted and Spider veins noted Neurologic: Grossly normal   Adult ECG Report  Rate: 62 ;  Rhythm: normal sinus rhythm and LVH with repolarization and reality. Otherwise normal axis and intervals.  Narrative Interpretation: None  Recent Labs:  None available   ASSESSMENT / PLAN: PAF (paroxysmal atrial fibrillation) As far as I can tell she has not had any recurrences while on amiodarone therapy. She's been on it so long that we don't have any baseline readings for her. She should have her LFTs and TSH checked. We will check to see if these of an recently checked by her PCP. If not, we will go in order those labs in addition to lipid panel just for screening.   No adverse effects of amiodarone noted. She is on low-dose of carvedilol for additional rate control if she does go in A. Fib. Not anticoagulated as she is not having recurrences and had a history of falls in  the past.  Hypertension goal BP (blood pressure) < 150/90 Blood pressure not adequately controlled today. She is is at home which usually in the 1:30 to 140 range. I have a hard time believing that. Can increase it ARB to 320 mg a valsartan less than 25 of HCTZ in addition to carvedilol.  Goal systolic blood pressure ranges roughly 150 mmHg to avoid falls.  Obesity (BMI 30-39.9) The patient understands the need to lose weight with diet and exercise. We have discussed specific strategies for this.    Orders Placed This Encounter  Procedures  . EKG 12-Lead   Meds ordered this encounter  Medications  . acetaminophen (TYLENOL) 500 MG tablet    Sig: Take 500 mg by mouth daily as needed.  Marland Kitchen anastrozole (ARIMIDEX) 1 MG tablet    Sig: Take 1 mg by mouth daily.  . valsartan-hydrochlorothiazide (DIOVAN-HCT) 320-25 MG per tablet    Sig: Take 1 tablet by mouth daily.    Dispense:  90 tablet    Refill:  3    Followup: 6 month   HARDING,DAVID W, M.D., M.S. Interventional Cardiologist   Pager # [phone]

## 2014-07-06 DIAGNOSIS — Z79811 Long term (current) use of aromatase inhibitors: Secondary | ICD-10-CM | POA: Diagnosis not present

## 2014-07-06 DIAGNOSIS — Z853 Personal history of malignant neoplasm of breast: Secondary | ICD-10-CM | POA: Diagnosis not present

## 2014-07-20 ENCOUNTER — Other Ambulatory Visit: Payer: Self-pay | Admitting: Cardiology

## 2014-07-20 MED ORDER — AMIODARONE HCL 200 MG PO TABS: 200.0000 mg | ORAL_TABLET | Freq: Every day | ORAL | Status: DC

## 2014-07-20 NOTE — Telephone Encounter (Signed)
°  1. Which medications need to be refilled? Amlodipine   2. Which pharmacy is medication to be sent to?[facility]  3. Do they need a 30 day or 90 day supply? Did not specify  4. Would they like a call back once the medication has been sent to the pharmacy? yes

## 2014-07-20 NOTE — Telephone Encounter (Signed)
Called patient to clarify which medication needed a refill. Amlodipine was denoted in message taken, not on med list. Patient states she needs refill of AMIODARONE.  Rx(s) sent to pharmacy electronically.

## 2014-07-22 DIAGNOSIS — M1990 Unspecified osteoarthritis, unspecified site: Secondary | ICD-10-CM | POA: Diagnosis not present

## 2014-09-03 ENCOUNTER — Telehealth: Payer: Self-pay | Admitting: Cardiology

## 2014-09-03 NOTE — Telephone Encounter (Signed)
Spoke to this patient. She cites a long history of uncontrolled/poorly controlled HTN.  Last seen in December. Cites problems w/ medication (started on Diovan/HCTZ @ last OV).   She states dizziness, weakness, "my eyes are so blurry", "about anything you can think of that's what it does to me". This is ongoing x months now.  She did not have any recent BPs to share w/ me as she does not check these at home; I did state that it would be hard for [place] to determine whether the medicine is having too much or too little effect on her BP control.  She reports systolic range 497-026, rarely lower than this.  I told pt I would communicate w/ Dr. Ellyn Hack on her concerns.  She could probably benefit from a BP check appt, will defer to Dr. Ellyn Hack for best recommendation.

## 2014-09-03 NOTE — Telephone Encounter (Signed)
Sara [name] is calling because she has been taking Valsartan/HCTZ 320/25mg  , and she state that she cannot take the medication anymore . Its making her weak , blurred vision. Please call    Thanks

## 2014-09-04 NOTE — Telephone Encounter (Signed)
Called, spoke to patient. Set up for April 1st appt w/ Sara [name] for med mgmt.  In conversation this AM she states she does in fact have a BP cuff available at home; I requested her to keep a check on her BPs daily if possible (systolic & diastolic) & pulse rate, and to bring cuff in for calibration at appt.  Pt voiced understanding & agreement w/ this plan.

## 2014-09-04 NOTE — Telephone Encounter (Signed)
Need more data -- re: BP trend. Not sure if she is dizzy b/c better BP or uncontrolled.  Can see App or Erasmo Downer - does not need to see MD.  [facility]

## 2014-09-06 NOTE — Telephone Encounter (Signed)
[id number]  Thnx

## 2014-09-18 ENCOUNTER — Ambulatory Visit (INDEPENDENT_AMBULATORY_CARE_PROVIDER_SITE_OTHER): Payer: Commercial Managed Care - HMO | Admitting: Pharmacist Clinician (PhC)/ Clinical Pharmacy Specialist

## 2014-09-18 VITALS — BP 240/90 | HR 64 | Ht 60.0 in | Wt 149.0 lb

## 2014-09-18 DIAGNOSIS — I480 Paroxysmal atrial fibrillation: Secondary | ICD-10-CM | POA: Diagnosis not present

## 2014-09-18 MED ORDER — AMLODIPINE BESYLATE 2.5 MG PO TABS: 2.5000 mg | ORAL_TABLET | Freq: Every day | ORAL | Status: DC

## 2014-09-18 NOTE — Patient Instructions (Signed)
Return for a a follow up appointment in 3 weeks  Your blood pressure today is 240/90  Check your blood pressure at home daily (if able) and keep record of the readings.  Take your BP meds as follows: add amlodipine 2.5 mg daily at bedtime  Bring all of your meds, your BP cuff and your record of home blood pressures to your next appointment.  Exercise as you're able, try to walk approximately 30 minutes per day.  Keep salt intake to a minimum, especially watch canned and prepared boxed foods.  Eat more fresh fruits and vegetables and fewer canned items.  Avoid eating in fast food restaurants.    HOW TO TAKE YOUR BLOOD PRESSURE: . Rest 5 minutes before taking your blood pressure. .  Don't smoke or drink caffeinated beverages for at least 30 minutes before. . Take your blood pressure before (not after) you eat. . Sit comfortably with your back supported and both feet on the floor (don't cross your legs). . Elevate your arm to heart level on a table or a desk. . Use the proper sized cuff. It should fit smoothly and snugly around your bare upper arm. There should be enough room to slip a fingertip under the cuff. The bottom edge of the cuff should be 1 inch above the crease of the elbow. . Ideally, take 3 measurements at one sitting and record the average.

## 2014-09-20 ENCOUNTER — Encounter: Payer: Self-pay | Admitting: Pharmacist Clinician (PhC)/ Clinical Pharmacy Specialist

## 2014-09-20 NOTE — Progress Notes (Signed)
09/20/2014 Sara [name] June 07, 1926 [id number]   HPI:  Sara [name] is a 81 y.o. female patient of Dr Ellyn Hack, with a PMH below who presents today for hypertension clinic evaluation.  She states she has had problems with hypertension for close to 50 years, and has always had problems with keeping it controlled.  She has a long list of medication allergies, although most of them she describes as causing her to feel weak or "funny".  She cannot recall any specific true allergic reactions.  Her list includes several ARBs and HCTZ, yet she currently takes valsartan/hct 320/25.  She also lists allergies to clonidine, lisinopril, hydralazine, tekturna and Exforge (valsartan/amlodipine).  Renal dopplers in 2009/10/10 are suggestive of some fibromuscular dysplasia in both renal arteries, which could lead to hypertension.    Cardiac Hx: hypertension for close to 50 years  Family Hx: hypertension for mother, son, 2 sisters; father died in 10-11-1934, cause unknown  Social Hx: no alcohol, tobacco, rare caffeine  Diet: most meals at home, only adds little salt when cooking some foods.  Exercise: none, has problems with her knees  Home BP readings: per patient systolic runs mostly 814-481'E, no readings >176.  Diastolic all WNL  Current antihypertensive medications: valsartan 320/25.  States she felt fine when at 160/25 mg, but this higher dose makes her unsteady and she can't stand well, causes n/v that lasts from 1 hour after taking (noon) until bedtime   Current Outpatient Prescriptions  Medication Sig Dispense Refill  . acetaminophen (TYLENOL) 500 MG tablet Take 500 mg by mouth daily as needed.    Marland Kitchen amiodarone (PACERONE) 200 MG tablet Take 1 tablet (200 mg total) by mouth daily. 30 tablet 11  . amLODipine (NORVASC) 2.5 MG tablet Take 1 tablet (2.5 mg total) by mouth daily. 30 tablet 1  . anastrozole (ARIMIDEX) 1 MG tablet Take 1 mg by mouth daily.    Marland Kitchen aspirin 81 MG tablet Take 81 mg by mouth daily.      . carvedilol (COREG) 6.25 MG tablet Take 6.25 mg by mouth daily.    . Cholecalciferol (VITAMIN D3) 1000 UNITS CAPS Take by mouth.    . clorazepate (TRANXENE) 3.75 MG tablet Take 3.75 mg by mouth as needed for anxiety.    Marland Kitchen esomeprazole (NEXIUM) 40 MG capsule Take 40 mg by mouth daily before breakfast.    . Omega-3 Fatty Acids (FISH OIL) 1000 MG CAPS Take by mouth.    . Red Yeast Rice 600 MG CAPS Take by mouth.    . valsartan-hydrochlorothiazide (DIOVAN-HCT) 320-25 MG per tablet Take 1 tablet by mouth daily. 90 tablet 3   No current facility-administered medications for this visit.    Allergies  Allergen Reactions  . Benicar [Olmesartan]     Unclear of the intolerance  . Bystolic [Nebivolol Hcl]   . Clonidine Derivatives   . Exforge [Amlodipine Besylate-Valsartan] Other (See Comments)    Unclear  . Hctz [Hydrochlorothiazide]     Currently taking without problem; question if this has to do with hypokalemia  . Hydralazine Other (See Comments)    Also tolerated, but had orthostatic changes on standing medication  . Lisinopril   . Multaq [Dronedarone] Nausea And Vomiting  . Rythmol [Propafenone]   . Statins   . Tekturna [Aliskiren]     Past Medical History  Diagnosis Date  . Anxiety   . Balance problem     Due to weakened knee  . Ankle edema  Mild, which is intermittent, usually mildly dependent, left slightly greater than the right.  . Arthritis     In her knees  . GERD (gastroesophageal reflux disease)   . Posthemorrhagic anemia   . Hiatal hernia     With GERD  . Hyperlipidemia   . Hypokalemia   . Hyperglycemia     Random, without any history of diabetes  . 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 Developed after cataract surgery 05/03/09  . Pseudophakia   . 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 Hematuria     Resolved after coumadin was stopped  . Paroxysmal atrial fibrillation     On amiodarone but not on anticoagulant   . H/O echocardiogram     Echo 03/01/11 EF = >55%. Shows normal systolic function with mild to moderate LV hypertrophy. Left atrial dimension 3.8 cm & a pulmonary artery pressure of 38. There was breakthrough diastolic dysfunction.  . Syncope 02/22/11    During OV on 02/22/11 her INR was 7.3 and was given 2.5 mg of vitamin K. Later that day she became diaphoretic & fainted.  . Hypertension     Difficult to control. Renal Doppler 06/02/10 showed less that 60% bilateral renal artery narrowing.    Blood pressure 240/90, pulse 64, height 5' (1.524 m), weight 149 lb (67.586 kg).    Tommy Medal PharmD CPP [facility]

## 2014-09-20 NOTE — Assessment & Plan Note (Addendum)
Today her BP in the office remains quite elevated at 240/90.  When she stood, it dropped to 220/94, and before leaving, a seated pressure was similar.  She feels fine, appears to be in no distress.  I asked her about amlodipine and she cannot recall ever taking that.  It is listed in her allergy list, but in combination with valsartan (Exforge).  I explain the medication to her, we will give her a very low dose (2.5mg ) to hopefully prevent any side effects.  As I show her the AVS, she sees "amlodipine" and says that she took it once before and it made her feel funny.  I told her that with the very low dose, she would hopefully not have any reaction, and with her BP being so high, we need to do something.  She seems willing to try for 3 weeks until I can see her again.  I asked her to write down her home BP readings each day and bring the log, along with her cuff, to her next visit.

## 2014-09-28 ENCOUNTER — Telehealth: Payer: Self-pay | Admitting: Cardiology

## 2014-09-28 NOTE — Telephone Encounter (Signed)
Pt called in stating that for the past couple of days her BP has been high and she is back in Afib and she would like to know what to do. Please advise.  Thanks

## 2014-09-28 NOTE — Telephone Encounter (Signed)
Pt BP back up (220/90 today, typically has been running in this range past 3-4 days). Discussed, she had been started on Norvasc by Erasmo Downer.  After discussion w patient, seems that she had not been clear on medication instructions and had stopped taking the valsartan. She apparently has been compliant w/ all other meds.  Instructed/reminded her that the norvasc addition was the only med change, she should still take all meds prescribed. Requested she resume the valsartan along with all other meds as indicated.  Advised that if symptoms of hypertensive crisis, appropriate to go to ER for eval/intervention. Pt voiced understanding.

## 2014-10-05 DIAGNOSIS — I10 Essential (primary) hypertension: Secondary | ICD-10-CM | POA: Diagnosis not present

## 2014-10-05 DIAGNOSIS — R5383 Other fatigue: Secondary | ICD-10-CM | POA: Diagnosis not present

## 2014-10-09 ENCOUNTER — Ambulatory Visit: Payer: Commercial Managed Care - HMO | Admitting: Pharmacist Clinician (PhC)/ Clinical Pharmacy Specialist

## 2014-11-04 DIAGNOSIS — R748 Abnormal levels of other serum enzymes: Secondary | ICD-10-CM | POA: Diagnosis not present

## 2014-11-04 DIAGNOSIS — I10 Essential (primary) hypertension: Secondary | ICD-10-CM | POA: Diagnosis not present

## 2014-11-06 DIAGNOSIS — Z79811 Long term (current) use of aromatase inhibitors: Secondary | ICD-10-CM | POA: Diagnosis not present

## 2014-11-06 DIAGNOSIS — Z853 Personal history of malignant neoplasm of breast: Secondary | ICD-10-CM | POA: Diagnosis not present

## 2014-11-16 DIAGNOSIS — R928 Other abnormal and inconclusive findings on diagnostic imaging of breast: Secondary | ICD-10-CM | POA: Diagnosis not present

## 2014-11-16 DIAGNOSIS — C50212 Malignant neoplasm of upper-inner quadrant of left female breast: Secondary | ICD-10-CM | POA: Diagnosis not present

## 2014-12-07 DIAGNOSIS — I10 Essential (primary) hypertension: Secondary | ICD-10-CM | POA: Diagnosis not present

## 2014-12-07 DIAGNOSIS — R5383 Other fatigue: Secondary | ICD-10-CM | POA: Diagnosis not present

## 2015-01-27 ENCOUNTER — Ambulatory Visit (INDEPENDENT_AMBULATORY_CARE_PROVIDER_SITE_OTHER): Payer: Commercial Managed Care - HMO | Admitting: Cardiology

## 2015-01-27 ENCOUNTER — Encounter: Payer: Self-pay | Admitting: Cardiology

## 2015-01-27 VITALS — BP 210/70 | HR 66 | Ht 60.0 in | Wt 144.0 lb

## 2015-01-27 DIAGNOSIS — R569 Unspecified convulsions: Secondary | ICD-10-CM | POA: Diagnosis not present

## 2015-01-27 DIAGNOSIS — I480 Paroxysmal atrial fibrillation: Secondary | ICD-10-CM | POA: Diagnosis not present

## 2015-01-27 DIAGNOSIS — I10 Essential (primary) hypertension: Secondary | ICD-10-CM | POA: Diagnosis not present

## 2015-01-27 DIAGNOSIS — G4089 Other seizures: Secondary | ICD-10-CM | POA: Diagnosis not present

## 2015-01-27 DIAGNOSIS — K219 Gastro-esophageal reflux disease without esophagitis: Secondary | ICD-10-CM | POA: Diagnosis not present

## 2015-01-27 DIAGNOSIS — I6523 Occlusion and stenosis of bilateral carotid arteries: Secondary | ICD-10-CM | POA: Diagnosis not present

## 2015-01-27 DIAGNOSIS — Z888 Allergy status to other drugs, medicaments and biological substances status: Secondary | ICD-10-CM | POA: Diagnosis not present

## 2015-01-27 DIAGNOSIS — R4182 Altered mental status, unspecified: Secondary | ICD-10-CM | POA: Diagnosis not present

## 2015-01-27 DIAGNOSIS — R4020 Unspecified coma: Secondary | ICD-10-CM | POA: Diagnosis not present

## 2015-01-27 DIAGNOSIS — Z881 Allergy status to other antibiotic agents status: Secondary | ICD-10-CM | POA: Diagnosis not present

## 2015-01-27 DIAGNOSIS — Z853 Personal history of malignant neoplasm of breast: Secondary | ICD-10-CM | POA: Diagnosis not present

## 2015-01-27 DIAGNOSIS — I639 Cerebral infarction, unspecified: Secondary | ICD-10-CM | POA: Diagnosis not present

## 2015-01-27 DIAGNOSIS — F419 Anxiety disorder, unspecified: Secondary | ICD-10-CM | POA: Diagnosis not present

## 2015-01-27 DIAGNOSIS — R531 Weakness: Secondary | ICD-10-CM | POA: Diagnosis not present

## 2015-01-27 DIAGNOSIS — I517 Cardiomegaly: Secondary | ICD-10-CM | POA: Diagnosis not present

## 2015-01-27 DIAGNOSIS — I4891 Unspecified atrial fibrillation: Secondary | ICD-10-CM | POA: Diagnosis not present

## 2015-01-27 DIAGNOSIS — Z23 Encounter for immunization: Secondary | ICD-10-CM | POA: Diagnosis not present

## 2015-01-27 DIAGNOSIS — C50919 Malignant neoplasm of unspecified site of unspecified female breast: Secondary | ICD-10-CM | POA: Diagnosis not present

## 2015-01-27 DIAGNOSIS — K449 Diaphragmatic hernia without obstruction or gangrene: Secondary | ICD-10-CM | POA: Diagnosis not present

## 2015-01-27 MED ORDER — HYDRALAZINE HCL 25 MG PO TABS: 25.0000 mg | ORAL_TABLET | Freq: Three times a day (TID) | ORAL | Status: DC

## 2015-01-27 NOTE — Progress Notes (Signed)
01/27/2015 Sara [name]   1925-08-24  [id number]  Primary Physician PROVIDER NOT IN SYSTEM Primary Cardiologist: Dr. Ellyn Hack Electrophysiologist: N/A  Reason for Visit/CC: Routine f/u for PAF and HTN  HPI:  The patient is a 81 year old female, followed by Dr. Ellyn Hack, who  presents to clinic for routine follow-up. She has a history of paroxysmal atrial fibrillation that has then well maintained on amiodarone therapy. She is not on oral anticoagulation given her advanced age and high fall risk. She also has a history of poorly controlled hypertension. During her last 2 office visits, her systolic pressures have been noted to be elevated in the 200s. She is currently on an ARB, thiazide diarrhetic and beta blocker therapy, for which she reports full medication compliance. At her last office visit, amlodipine was initiated however the patient self discontinued this due to reports of intolerance. She reports to adherence to a low-sodium diet, although this is questionable. She denies any symptoms of chest pain or dyspnea. No orthopnea, PND or lower extremity edema. She denies any symptoms of breakthrough atrial fibrillation. She does note recent, progressive, bilateral blurriness of vision, but no other neurological deficits. Her EKG shows normal sinus rhythm with nonspecific ST abnormalities, previously seen on prior EKGs. QT/QTC is 468/490 ms. Her blood pressure today is poorly controlled at 210/70 mm per mercury. She denies chest pain, dyspnea and headache.     Current Outpatient Prescriptions  Medication Sig Dispense Refill  . acetaminophen (TYLENOL) 500 MG tablet Take 500 mg by mouth daily as needed.    Marland Kitchen amiodarone (PACERONE) 200 MG tablet Take 1 tablet (200 mg total) by mouth daily. 30 tablet 11  . anastrozole (ARIMIDEX) 1 MG tablet Take 1 mg by mouth daily.    Marland Kitchen aspirin 81 MG tablet Take 81 mg by mouth daily.    . carvedilol (COREG) 6.25 MG tablet Take 6.25 mg by mouth daily.    .  Cholecalciferol (VITAMIN D3) 1000 UNITS CAPS Take by mouth.    . clorazepate (TRANXENE) 3.75 MG tablet Take 3.75 mg by mouth as needed for anxiety.    Marland Kitchen esomeprazole (NEXIUM) 40 MG capsule Take 40 mg by mouth daily before breakfast.    . Omega-3 Fatty Acids (FISH OIL) 1000 MG CAPS Take by mouth.    . valsartan-hydrochlorothiazide (DIOVAN-HCT) 320-25 MG per tablet Take 1 tablet by mouth daily. 90 tablet 3   No current facility-administered medications for this visit.    Allergies  Allergen Reactions  . Benicar [Olmesartan]     Unclear of the intolerance  . Bystolic [Nebivolol Hcl]   . Clonidine Derivatives   . Exforge [Amlodipine Besylate-Valsartan] Other (See Comments)    Unclear  . Hctz [Hydrochlorothiazide]     Currently taking without problem; question if this has to do with hypokalemia  . Hydralazine Other (See Comments)    Also tolerated, but had orthostatic changes on standing medication  . Lisinopril   . Multaq [Dronedarone] Nausea And Vomiting  . Rythmol [Propafenone]   . Statins   . Tekturna [Aliskiren]     Social History   Social History  . Marital Status: Widowed    Spouse Name: N/A  . Number of Children: 1  . Years of Education: N/A   Occupational History  .     Social History Main Topics  . Smoking status: Never Smoker   . Smokeless tobacco: Not on file  . Alcohol Use: No  . Drug Use: No  . Sexual Activity: Not on  file   Other Topics Concern  . Not on file   Social History Narrative   She is a widowed mother of one and grandmother of one.  She does not exercise.  She does not smoke and does not drink alcohol.     Review of Systems: General: negative for chills, fever, night sweats or weight changes.  Cardiovascular: negative for chest pain, dyspnea on exertion, edema, orthopnea, palpitations, paroxysmal nocturnal dyspnea or shortness of breath Dermatological: negative for rash Respiratory: negative for cough or wheezing Urologic: negative for  hematuria Abdominal: negative for nausea, vomiting, diarrhea, bright red blood per rectum, melena, or hematemesis Neurologic: negative for visual changes, syncope, or dizziness All other systems reviewed and are otherwise negative except as noted above.    Blood pressure 210/70, pulse 66, height 5' (1.524 m), weight 144 lb (65.318 kg).  General appearance: alert, cooperative and no distress Neck: no carotid bruit and no JVD Lungs: clear to auscultation bilaterally Heart: regular rate and rhythm and S4 present Extremities: no LEE Pulses: 2+ and symmetric Skin: warm and dry Neurologic: Grossly normal  EKG NSR 66 bpm. Non specific ST abnormalities (seen on prior EKG).   ASSESSMENT AND PLAN:   1. PAF: Maintaining normal sinus rhythm on amiodarone. EKG shows normal sinus rhythm. QT/QTC is 468/490 ms. We'll continue her on amiodarone therapy and we'll order basic laboratory work including TSH and comprehensive metabolic panel to assess thyroid function and liver function. We'll also look at her electrolytes. Given her progressive decline in vision, in the setting of amiodarone therapy, I have recommended that she follow-up with her eye doctor for a follow-up eye exam. Given her advanced age and high fall risk, she is not a candidate for oral anticoagulation. Continue beta blocker therapy for rate control.  2. Hypertension: Poorly controlled with systolic blood pressure in the 200s. Denies chest pain, dyspnea, orthopnea, PND, lower extremity edema and headaches. We are unable to further titrate her carvedilol due to a resting heart rate in the low 60s. We will continue her on her current dose of 6.25 mg twice a day. We will also continue her on her current dose of Diovan, 320-25 milligrams. She is intolerant to amlodipine. We will try adding low-dose hydralazine 3 times a day. We discussed the importance of low sodium diet and adherence to her medications daily. She will follow-up in 2 weeks with our  office pharmacist to reassess blood pressure.   PLAN  continue current plan of care but add hydralazine, 25 mg 3 times a day. Follow-up in 2 weeks with also office pharmacist to reassess blood pressure. Follow-up with Dr. Ellyn Hack in 3 months.  Lyda Jester PA-C 01/27/2015 11:31 AM

## 2015-01-27 NOTE — Patient Instructions (Signed)
Your physician recommends that you schedule a follow-up appointment in: [place] physician recommends that you schedule a follow-up appointment in: [place] [address] [facility]  START HYDRALAZINE 25 MG ONE TABLET THREE TIMES DAILY  Your physician recommends that you return for lab work WITH MEDICAL DOCTOR= TSH, CMET  Low-Sodium Eating Plan Sodium raises blood pressure and causes water to be held in the body. Getting less sodium from food will help lower your blood pressure, reduce any swelling, and protect your heart, liver, and kidneys. We get sodium by adding salt (sodium chloride) to food. Most of our sodium comes from canned, boxed, and frozen foods. Restaurant foods, fast foods, and pizza are also very high in sodium. Even if you take medicine to lower your blood pressure or to reduce fluid in your body, getting less sodium from your food is important. WHAT IS MY PLAN? Most people should limit their sodium intake to 2,300 mg a day. Your health care provider recommends that you limit your sodium intake to __________ a day.  WHAT DO I NEED TO KNOW ABOUT THIS EATING PLAN? For the low-sodium eating plan, you will follow these general guidelines:  Choose foods with a % Daily Value for sodium of less than 5% (as listed on the food label).   Use salt-free seasonings or herbs instead of table salt or sea salt.   Check with your health care provider or pharmacist before using salt substitutes.   Eat fresh foods.  Eat more vegetables and fruits.  Limit canned vegetables. If you do use them, rinse them well to decrease the sodium.   Limit cheese to 1 oz (28 g) per day.   Eat lower-sodium products, often labeled as "lower sodium" or "no salt added."  Avoid foods that contain monosodium glutamate (MSG). MSG is sometimes added to [place] food and some canned foods.  Check food labels (Nutrition Facts labels) on foods to learn how much sodium is in one  serving.  Eat more home-cooked food and less restaurant, buffet, and fast food.  When eating at a restaurant, ask that your food be prepared with less salt or none, if possible.  HOW DO I READ FOOD LABELS FOR SODIUM INFORMATION? The Nutrition Facts label lists the amount of sodium in one serving of the food. If you eat more than one serving, you must multiply the listed amount of sodium by the number of servings. Food labels may also identify foods as:  Sodium free--Less than 5 mg in a serving.  Very low sodium--35 mg or less in a serving.  Low sodium--140 mg or less in a serving.  Light in sodium--50% less sodium in a serving. For example, if a food that usually has 300 mg of sodium is changed to become light in sodium, it will have 150 mg of sodium.  Reduced sodium--25% less sodium in a serving. For example, if a food that usually has 400 mg of sodium is changed to reduced sodium, it will have 300 mg of sodium. WHAT FOODS CAN I EAT? Grains Low-sodium cereals, including oats, puffed wheat and rice, and shredded wheat cereals. Low-sodium crackers. Unsalted rice and pasta. Lower-sodium bread.  Vegetables Frozen or fresh vegetables. Low-sodium or reduced-sodium canned vegetables. Low-sodium or reduced-sodium tomato sauce and paste. Low-sodium or reduced-sodium tomato and vegetable juices.  Fruits Fresh, frozen, and canned fruit. Fruit juice.  Meat and Other Protein Products Low-sodium canned tuna and salmon. Fresh or frozen meat, poultry, seafood,  and fish. Lamb. Unsalted nuts. Dried beans, peas, and lentils without added salt. Unsalted canned beans. Homemade soups without salt. Eggs.  Dairy Milk. Soy milk. Ricotta cheese. Low-sodium or reduced-sodium cheeses. Yogurt.  Condiments Fresh and dried herbs and spices. Salt-free seasonings. Onion and garlic powders. Low-sodium varieties of mustard and ketchup. Lemon juice.  Fats and Oils Reduced-sodium salad dressings. Unsalted  butter.  Other Unsalted popcorn and pretzels.  The items listed above may not be a complete list of recommended foods or beverages. Contact your dietitian for more options. WHAT FOODS ARE NOT RECOMMENDED? Grains Instant hot cereals. Bread stuffing, pancake, and biscuit mixes. Croutons. Seasoned rice or pasta mixes. Noodle soup cups. Boxed or frozen macaroni and cheese. Self-rising flour. Regular salted crackers. Vegetables Regular canned vegetables. Regular canned tomato sauce and paste. Regular tomato and vegetable juices. Frozen vegetables in sauces. Salted french fries. Olives. Angie Fava. Relishes. Sauerkraut. Salsa. Meat and Other Protein Products Salted, canned, smoked, spiced, or pickled meats, seafood, or fish. Bacon, ham, sausage, hot dogs, corned beef, chipped beef, and packaged luncheon meats. Salt pork. Jerky. Pickled herring. Anchovies, regular canned tuna, and sardines. Salted nuts. Dairy Processed cheese and cheese spreads. Cheese curds. Blue cheese and cottage cheese. Buttermilk.  Condiments Onion and garlic salt, seasoned salt, table salt, and sea salt. Canned and packaged gravies. Worcestershire sauce. Tartar sauce. Barbecue sauce. Teriyaki sauce. Soy sauce, including reduced sodium. Steak sauce. Fish sauce. Oyster sauce. Cocktail sauce. Horseradish. Regular ketchup and mustard. Meat flavorings and tenderizers. Bouillon cubes. Hot sauce. Tabasco sauce. Marinades. Taco seasonings. Relishes. Fats and Oils Regular salad dressings. Salted butter. Margarine. Ghee. Bacon fat.  Other Potato and tortilla chips. Corn chips and puffs. Salted popcorn and pretzels. Canned or dried soups. Pizza. Frozen entrees and pot pies.  The items listed above may not be a complete list of foods and beverages to avoid. Contact your dietitian for more information. Document Released: 11/25/2001 Document Revised: 06/10/2013 Document Reviewed: 04/09/2013 [facility] Patient Information 2015 [place],  [place]. This information is not intended to replace advice given to you by your health care provider. Make sure you discuss any questions you have with your health care provider.

## 2015-01-29 DIAGNOSIS — K219 Gastro-esophageal reflux disease without esophagitis: Secondary | ICD-10-CM | POA: Diagnosis not present

## 2015-01-29 DIAGNOSIS — C50919 Malignant neoplasm of unspecified site of unspecified female breast: Secondary | ICD-10-CM | POA: Diagnosis not present

## 2015-01-29 DIAGNOSIS — Z853 Personal history of malignant neoplasm of breast: Secondary | ICD-10-CM | POA: Diagnosis not present

## 2015-01-29 DIAGNOSIS — Z888 Allergy status to other drugs, medicaments and biological substances status: Secondary | ICD-10-CM | POA: Diagnosis not present

## 2015-01-29 DIAGNOSIS — Z881 Allergy status to other antibiotic agents status: Secondary | ICD-10-CM | POA: Diagnosis not present

## 2015-01-29 DIAGNOSIS — Z23 Encounter for immunization: Secondary | ICD-10-CM | POA: Diagnosis not present

## 2015-01-29 DIAGNOSIS — F419 Anxiety disorder, unspecified: Secondary | ICD-10-CM | POA: Diagnosis not present

## 2015-01-29 DIAGNOSIS — I480 Paroxysmal atrial fibrillation: Secondary | ICD-10-CM | POA: Diagnosis not present

## 2015-01-29 DIAGNOSIS — I10 Essential (primary) hypertension: Secondary | ICD-10-CM | POA: Diagnosis not present

## 2015-02-01 ENCOUNTER — Ambulatory Visit: Payer: Commercial Managed Care - HMO | Admitting: Physician Assistant

## 2015-02-05 DIAGNOSIS — R1084 Generalized abdominal pain: Secondary | ICD-10-CM | POA: Diagnosis not present

## 2015-02-05 DIAGNOSIS — I10 Essential (primary) hypertension: Secondary | ICD-10-CM | POA: Diagnosis not present

## 2015-02-16 DIAGNOSIS — M810 Age-related osteoporosis without current pathological fracture: Secondary | ICD-10-CM | POA: Diagnosis not present

## 2015-02-16 DIAGNOSIS — I10 Essential (primary) hypertension: Secondary | ICD-10-CM | POA: Diagnosis not present

## 2015-02-16 DIAGNOSIS — I482 Chronic atrial fibrillation: Secondary | ICD-10-CM | POA: Diagnosis not present

## 2015-02-16 DIAGNOSIS — E785 Hyperlipidemia, unspecified: Secondary | ICD-10-CM | POA: Diagnosis not present

## 2015-02-16 DIAGNOSIS — K219 Gastro-esophageal reflux disease without esophagitis: Secondary | ICD-10-CM | POA: Diagnosis not present

## 2015-02-16 DIAGNOSIS — Z1389 Encounter for screening for other disorder: Secondary | ICD-10-CM | POA: Diagnosis not present

## 2015-02-16 DIAGNOSIS — E559 Vitamin D deficiency, unspecified: Secondary | ICD-10-CM | POA: Diagnosis not present

## 2015-02-16 DIAGNOSIS — F411 Generalized anxiety disorder: Secondary | ICD-10-CM | POA: Diagnosis not present

## 2015-02-16 DIAGNOSIS — Z0000 Encounter for general adult medical examination without abnormal findings: Secondary | ICD-10-CM | POA: Diagnosis not present

## 2015-03-02 DIAGNOSIS — S2239XA Fracture of one rib, unspecified side, initial encounter for closed fracture: Secondary | ICD-10-CM | POA: Diagnosis not present

## 2015-03-02 DIAGNOSIS — T148 Other injury of unspecified body region: Secondary | ICD-10-CM | POA: Diagnosis not present

## 2015-03-02 DIAGNOSIS — E876 Hypokalemia: Secondary | ICD-10-CM | POA: Diagnosis not present

## 2015-03-02 DIAGNOSIS — K868 Other specified diseases of pancreas: Secondary | ICD-10-CM | POA: Diagnosis not present

## 2015-03-02 DIAGNOSIS — I4891 Unspecified atrial fibrillation: Secondary | ICD-10-CM | POA: Diagnosis not present

## 2015-03-02 DIAGNOSIS — I10 Essential (primary) hypertension: Secondary | ICD-10-CM | POA: Diagnosis not present

## 2015-03-02 DIAGNOSIS — K869 Disease of pancreas, unspecified: Secondary | ICD-10-CM | POA: Diagnosis not present

## 2015-03-02 DIAGNOSIS — S301XXA Contusion of abdominal wall, initial encounter: Secondary | ICD-10-CM | POA: Diagnosis not present

## 2015-03-02 DIAGNOSIS — J189 Pneumonia, unspecified organism: Secondary | ICD-10-CM | POA: Diagnosis not present

## 2015-03-02 DIAGNOSIS — R55 Syncope and collapse: Secondary | ICD-10-CM | POA: Diagnosis not present

## 2015-03-02 DIAGNOSIS — R1084 Generalized abdominal pain: Secondary | ICD-10-CM | POA: Diagnosis not present

## 2015-03-02 DIAGNOSIS — K219 Gastro-esophageal reflux disease without esophagitis: Secondary | ICD-10-CM | POA: Diagnosis not present

## 2015-03-02 DIAGNOSIS — S2242XA Multiple fractures of ribs, left side, initial encounter for closed fracture: Secondary | ICD-10-CM | POA: Diagnosis not present

## 2015-03-02 DIAGNOSIS — E871 Hypo-osmolality and hyponatremia: Secondary | ICD-10-CM | POA: Diagnosis not present

## 2015-03-02 DIAGNOSIS — E860 Dehydration: Secondary | ICD-10-CM | POA: Diagnosis not present

## 2015-03-02 DIAGNOSIS — K589 Irritable bowel syndrome without diarrhea: Secondary | ICD-10-CM | POA: Diagnosis not present

## 2015-03-02 DIAGNOSIS — R509 Fever, unspecified: Secondary | ICD-10-CM | POA: Diagnosis not present

## 2015-03-02 DIAGNOSIS — S20219A Contusion of unspecified front wall of thorax, initial encounter: Secondary | ICD-10-CM | POA: Diagnosis not present

## 2015-03-07 DIAGNOSIS — S2242XA Multiple fractures of ribs, left side, initial encounter for closed fracture: Secondary | ICD-10-CM | POA: Diagnosis not present

## 2015-03-09 DIAGNOSIS — S2242XD Multiple fractures of ribs, left side, subsequent encounter for fracture with routine healing: Secondary | ICD-10-CM | POA: Diagnosis not present

## 2015-03-09 DIAGNOSIS — Z9181 History of falling: Secondary | ICD-10-CM | POA: Diagnosis not present

## 2015-03-09 DIAGNOSIS — Z853 Personal history of malignant neoplasm of breast: Secondary | ICD-10-CM | POA: Diagnosis not present

## 2015-03-09 DIAGNOSIS — I10 Essential (primary) hypertension: Secondary | ICD-10-CM | POA: Diagnosis not present

## 2015-03-09 DIAGNOSIS — S301XXD Contusion of abdominal wall, subsequent encounter: Secondary | ICD-10-CM | POA: Diagnosis not present

## 2015-03-09 DIAGNOSIS — I4891 Unspecified atrial fibrillation: Secondary | ICD-10-CM | POA: Diagnosis not present

## 2015-03-09 DIAGNOSIS — K869 Disease of pancreas, unspecified: Secondary | ICD-10-CM | POA: Diagnosis not present

## 2015-03-10 DIAGNOSIS — S301XXD Contusion of abdominal wall, subsequent encounter: Secondary | ICD-10-CM | POA: Diagnosis not present

## 2015-03-10 DIAGNOSIS — I10 Essential (primary) hypertension: Secondary | ICD-10-CM | POA: Diagnosis not present

## 2015-03-10 DIAGNOSIS — Z853 Personal history of malignant neoplasm of breast: Secondary | ICD-10-CM | POA: Diagnosis not present

## 2015-03-10 DIAGNOSIS — K869 Disease of pancreas, unspecified: Secondary | ICD-10-CM | POA: Diagnosis not present

## 2015-03-10 DIAGNOSIS — I4891 Unspecified atrial fibrillation: Secondary | ICD-10-CM | POA: Diagnosis not present

## 2015-03-10 DIAGNOSIS — S2242XD Multiple fractures of ribs, left side, subsequent encounter for fracture with routine healing: Secondary | ICD-10-CM | POA: Diagnosis not present

## 2015-03-10 DIAGNOSIS — Z9181 History of falling: Secondary | ICD-10-CM | POA: Diagnosis not present

## 2015-03-12 DIAGNOSIS — S2242XD Multiple fractures of ribs, left side, subsequent encounter for fracture with routine healing: Secondary | ICD-10-CM | POA: Diagnosis not present

## 2015-03-12 DIAGNOSIS — Z9181 History of falling: Secondary | ICD-10-CM | POA: Diagnosis not present

## 2015-03-12 DIAGNOSIS — K869 Disease of pancreas, unspecified: Secondary | ICD-10-CM | POA: Diagnosis not present

## 2015-03-12 DIAGNOSIS — S2239XA Fracture of one rib, unspecified side, initial encounter for closed fracture: Secondary | ICD-10-CM | POA: Diagnosis not present

## 2015-03-12 DIAGNOSIS — R600 Localized edema: Secondary | ICD-10-CM | POA: Diagnosis not present

## 2015-03-12 DIAGNOSIS — I10 Essential (primary) hypertension: Secondary | ICD-10-CM | POA: Diagnosis not present

## 2015-03-12 DIAGNOSIS — R0600 Dyspnea, unspecified: Secondary | ICD-10-CM | POA: Diagnosis not present

## 2015-03-12 DIAGNOSIS — Z853 Personal history of malignant neoplasm of breast: Secondary | ICD-10-CM | POA: Diagnosis not present

## 2015-03-12 DIAGNOSIS — I509 Heart failure, unspecified: Secondary | ICD-10-CM | POA: Diagnosis not present

## 2015-03-12 DIAGNOSIS — S301XXD Contusion of abdominal wall, subsequent encounter: Secondary | ICD-10-CM | POA: Diagnosis not present

## 2015-03-12 DIAGNOSIS — M25579 Pain in unspecified ankle and joints of unspecified foot: Secondary | ICD-10-CM | POA: Diagnosis not present

## 2015-03-12 DIAGNOSIS — I4891 Unspecified atrial fibrillation: Secondary | ICD-10-CM | POA: Diagnosis not present

## 2015-03-13 DIAGNOSIS — I4891 Unspecified atrial fibrillation: Secondary | ICD-10-CM | POA: Diagnosis not present

## 2015-03-13 DIAGNOSIS — S301XXD Contusion of abdominal wall, subsequent encounter: Secondary | ICD-10-CM | POA: Diagnosis not present

## 2015-03-13 DIAGNOSIS — Z9181 History of falling: Secondary | ICD-10-CM | POA: Diagnosis not present

## 2015-03-13 DIAGNOSIS — S2242XD Multiple fractures of ribs, left side, subsequent encounter for fracture with routine healing: Secondary | ICD-10-CM | POA: Diagnosis not present

## 2015-03-13 DIAGNOSIS — K869 Disease of pancreas, unspecified: Secondary | ICD-10-CM | POA: Diagnosis not present

## 2015-03-13 DIAGNOSIS — I10 Essential (primary) hypertension: Secondary | ICD-10-CM | POA: Diagnosis not present

## 2015-03-13 DIAGNOSIS — Z853 Personal history of malignant neoplasm of breast: Secondary | ICD-10-CM | POA: Diagnosis not present

## 2015-03-15 DIAGNOSIS — S2239XA Fracture of one rib, unspecified side, initial encounter for closed fracture: Secondary | ICD-10-CM | POA: Diagnosis not present

## 2015-03-15 DIAGNOSIS — I509 Heart failure, unspecified: Secondary | ICD-10-CM | POA: Diagnosis not present

## 2015-03-15 DIAGNOSIS — K869 Disease of pancreas, unspecified: Secondary | ICD-10-CM | POA: Diagnosis not present

## 2015-03-15 DIAGNOSIS — I4891 Unspecified atrial fibrillation: Secondary | ICD-10-CM | POA: Diagnosis not present

## 2015-03-15 DIAGNOSIS — R918 Other nonspecific abnormal finding of lung field: Secondary | ICD-10-CM | POA: Diagnosis not present

## 2015-03-15 DIAGNOSIS — I10 Essential (primary) hypertension: Secondary | ICD-10-CM | POA: Diagnosis not present

## 2015-03-16 DIAGNOSIS — Z9181 History of falling: Secondary | ICD-10-CM | POA: Diagnosis not present

## 2015-03-16 DIAGNOSIS — S2242XD Multiple fractures of ribs, left side, subsequent encounter for fracture with routine healing: Secondary | ICD-10-CM | POA: Diagnosis not present

## 2015-03-16 DIAGNOSIS — Z853 Personal history of malignant neoplasm of breast: Secondary | ICD-10-CM | POA: Diagnosis not present

## 2015-03-16 DIAGNOSIS — S301XXD Contusion of abdominal wall, subsequent encounter: Secondary | ICD-10-CM | POA: Diagnosis not present

## 2015-03-16 DIAGNOSIS — K869 Disease of pancreas, unspecified: Secondary | ICD-10-CM | POA: Diagnosis not present

## 2015-03-16 DIAGNOSIS — I4891 Unspecified atrial fibrillation: Secondary | ICD-10-CM | POA: Diagnosis not present

## 2015-03-16 DIAGNOSIS — I10 Essential (primary) hypertension: Secondary | ICD-10-CM | POA: Diagnosis not present

## 2015-03-17 DIAGNOSIS — I10 Essential (primary) hypertension: Secondary | ICD-10-CM | POA: Diagnosis not present

## 2015-03-17 DIAGNOSIS — I4891 Unspecified atrial fibrillation: Secondary | ICD-10-CM | POA: Diagnosis not present

## 2015-03-17 DIAGNOSIS — S2242XD Multiple fractures of ribs, left side, subsequent encounter for fracture with routine healing: Secondary | ICD-10-CM | POA: Diagnosis not present

## 2015-03-17 DIAGNOSIS — S301XXD Contusion of abdominal wall, subsequent encounter: Secondary | ICD-10-CM | POA: Diagnosis not present

## 2015-03-17 DIAGNOSIS — Z853 Personal history of malignant neoplasm of breast: Secondary | ICD-10-CM | POA: Diagnosis not present

## 2015-03-17 DIAGNOSIS — Z9181 History of falling: Secondary | ICD-10-CM | POA: Diagnosis not present

## 2015-03-17 DIAGNOSIS — K869 Disease of pancreas, unspecified: Secondary | ICD-10-CM | POA: Diagnosis not present

## 2015-03-18 DIAGNOSIS — S2242XD Multiple fractures of ribs, left side, subsequent encounter for fracture with routine healing: Secondary | ICD-10-CM | POA: Diagnosis not present

## 2015-03-18 DIAGNOSIS — I4891 Unspecified atrial fibrillation: Secondary | ICD-10-CM | POA: Diagnosis not present

## 2015-03-18 DIAGNOSIS — K869 Disease of pancreas, unspecified: Secondary | ICD-10-CM | POA: Diagnosis not present

## 2015-03-18 DIAGNOSIS — S301XXD Contusion of abdominal wall, subsequent encounter: Secondary | ICD-10-CM | POA: Diagnosis not present

## 2015-03-18 DIAGNOSIS — Z853 Personal history of malignant neoplasm of breast: Secondary | ICD-10-CM | POA: Diagnosis not present

## 2015-03-18 DIAGNOSIS — Z9181 History of falling: Secondary | ICD-10-CM | POA: Diagnosis not present

## 2015-03-18 DIAGNOSIS — I10 Essential (primary) hypertension: Secondary | ICD-10-CM | POA: Diagnosis not present

## 2015-03-19 DIAGNOSIS — K869 Disease of pancreas, unspecified: Secondary | ICD-10-CM | POA: Diagnosis not present

## 2015-03-19 DIAGNOSIS — R918 Other nonspecific abnormal finding of lung field: Secondary | ICD-10-CM | POA: Diagnosis not present

## 2015-03-19 DIAGNOSIS — C50212 Malignant neoplasm of upper-inner quadrant of left female breast: Secondary | ICD-10-CM | POA: Diagnosis not present

## 2015-03-19 DIAGNOSIS — S2242XD Multiple fractures of ribs, left side, subsequent encounter for fracture with routine healing: Secondary | ICD-10-CM | POA: Diagnosis not present

## 2015-03-19 DIAGNOSIS — I4891 Unspecified atrial fibrillation: Secondary | ICD-10-CM | POA: Diagnosis not present

## 2015-03-19 DIAGNOSIS — Z853 Personal history of malignant neoplasm of breast: Secondary | ICD-10-CM | POA: Diagnosis not present

## 2015-03-19 DIAGNOSIS — S301XXD Contusion of abdominal wall, subsequent encounter: Secondary | ICD-10-CM | POA: Diagnosis not present

## 2015-03-19 DIAGNOSIS — Z79811 Long term (current) use of aromatase inhibitors: Secondary | ICD-10-CM | POA: Diagnosis not present

## 2015-03-19 DIAGNOSIS — Z9181 History of falling: Secondary | ICD-10-CM | POA: Diagnosis not present

## 2015-03-19 DIAGNOSIS — I10 Essential (primary) hypertension: Secondary | ICD-10-CM | POA: Diagnosis not present

## 2015-03-23 DIAGNOSIS — Z853 Personal history of malignant neoplasm of breast: Secondary | ICD-10-CM | POA: Diagnosis not present

## 2015-03-23 DIAGNOSIS — S301XXD Contusion of abdominal wall, subsequent encounter: Secondary | ICD-10-CM | POA: Diagnosis not present

## 2015-03-23 DIAGNOSIS — Z9181 History of falling: Secondary | ICD-10-CM | POA: Diagnosis not present

## 2015-03-23 DIAGNOSIS — K869 Disease of pancreas, unspecified: Secondary | ICD-10-CM | POA: Diagnosis not present

## 2015-03-23 DIAGNOSIS — I10 Essential (primary) hypertension: Secondary | ICD-10-CM | POA: Diagnosis not present

## 2015-03-23 DIAGNOSIS — S2242XD Multiple fractures of ribs, left side, subsequent encounter for fracture with routine healing: Secondary | ICD-10-CM | POA: Diagnosis not present

## 2015-03-23 DIAGNOSIS — I4891 Unspecified atrial fibrillation: Secondary | ICD-10-CM | POA: Diagnosis not present

## 2015-03-25 DIAGNOSIS — K869 Disease of pancreas, unspecified: Secondary | ICD-10-CM | POA: Diagnosis not present

## 2015-03-25 DIAGNOSIS — I10 Essential (primary) hypertension: Secondary | ICD-10-CM | POA: Diagnosis not present

## 2015-03-25 DIAGNOSIS — S301XXD Contusion of abdominal wall, subsequent encounter: Secondary | ICD-10-CM | POA: Diagnosis not present

## 2015-03-25 DIAGNOSIS — Z853 Personal history of malignant neoplasm of breast: Secondary | ICD-10-CM | POA: Diagnosis not present

## 2015-03-25 DIAGNOSIS — S2242XD Multiple fractures of ribs, left side, subsequent encounter for fracture with routine healing: Secondary | ICD-10-CM | POA: Diagnosis not present

## 2015-03-25 DIAGNOSIS — I4891 Unspecified atrial fibrillation: Secondary | ICD-10-CM | POA: Diagnosis not present

## 2015-03-25 DIAGNOSIS — Z9181 History of falling: Secondary | ICD-10-CM | POA: Diagnosis not present

## 2015-03-30 DIAGNOSIS — Z853 Personal history of malignant neoplasm of breast: Secondary | ICD-10-CM | POA: Diagnosis not present

## 2015-03-30 DIAGNOSIS — I4891 Unspecified atrial fibrillation: Secondary | ICD-10-CM | POA: Diagnosis not present

## 2015-03-30 DIAGNOSIS — I10 Essential (primary) hypertension: Secondary | ICD-10-CM | POA: Diagnosis not present

## 2015-03-30 DIAGNOSIS — Z9181 History of falling: Secondary | ICD-10-CM | POA: Diagnosis not present

## 2015-03-30 DIAGNOSIS — K869 Disease of pancreas, unspecified: Secondary | ICD-10-CM | POA: Diagnosis not present

## 2015-03-30 DIAGNOSIS — S2242XD Multiple fractures of ribs, left side, subsequent encounter for fracture with routine healing: Secondary | ICD-10-CM | POA: Diagnosis not present

## 2015-03-30 DIAGNOSIS — S301XXD Contusion of abdominal wall, subsequent encounter: Secondary | ICD-10-CM | POA: Diagnosis not present

## 2015-04-02 DIAGNOSIS — I10 Essential (primary) hypertension: Secondary | ICD-10-CM | POA: Diagnosis not present

## 2015-04-02 DIAGNOSIS — Z9181 History of falling: Secondary | ICD-10-CM | POA: Diagnosis not present

## 2015-04-02 DIAGNOSIS — K869 Disease of pancreas, unspecified: Secondary | ICD-10-CM | POA: Diagnosis not present

## 2015-04-02 DIAGNOSIS — I4891 Unspecified atrial fibrillation: Secondary | ICD-10-CM | POA: Diagnosis not present

## 2015-04-02 DIAGNOSIS — S301XXD Contusion of abdominal wall, subsequent encounter: Secondary | ICD-10-CM | POA: Diagnosis not present

## 2015-04-02 DIAGNOSIS — Z853 Personal history of malignant neoplasm of breast: Secondary | ICD-10-CM | POA: Diagnosis not present

## 2015-04-02 DIAGNOSIS — S2242XD Multiple fractures of ribs, left side, subsequent encounter for fracture with routine healing: Secondary | ICD-10-CM | POA: Diagnosis not present

## 2015-04-05 DIAGNOSIS — Z9181 History of falling: Secondary | ICD-10-CM | POA: Diagnosis not present

## 2015-04-05 DIAGNOSIS — I4891 Unspecified atrial fibrillation: Secondary | ICD-10-CM | POA: Diagnosis not present

## 2015-04-05 DIAGNOSIS — Z853 Personal history of malignant neoplasm of breast: Secondary | ICD-10-CM | POA: Diagnosis not present

## 2015-04-05 DIAGNOSIS — S2242XD Multiple fractures of ribs, left side, subsequent encounter for fracture with routine healing: Secondary | ICD-10-CM | POA: Diagnosis not present

## 2015-04-05 DIAGNOSIS — I10 Essential (primary) hypertension: Secondary | ICD-10-CM | POA: Diagnosis not present

## 2015-04-05 DIAGNOSIS — K869 Disease of pancreas, unspecified: Secondary | ICD-10-CM | POA: Diagnosis not present

## 2015-04-05 DIAGNOSIS — S301XXD Contusion of abdominal wall, subsequent encounter: Secondary | ICD-10-CM | POA: Diagnosis not present

## 2015-04-06 DIAGNOSIS — I10 Essential (primary) hypertension: Secondary | ICD-10-CM | POA: Diagnosis not present

## 2015-04-06 DIAGNOSIS — S2242XD Multiple fractures of ribs, left side, subsequent encounter for fracture with routine healing: Secondary | ICD-10-CM | POA: Diagnosis not present

## 2015-04-06 DIAGNOSIS — I4891 Unspecified atrial fibrillation: Secondary | ICD-10-CM | POA: Diagnosis not present

## 2015-04-06 DIAGNOSIS — Z853 Personal history of malignant neoplasm of breast: Secondary | ICD-10-CM | POA: Diagnosis not present

## 2015-04-06 DIAGNOSIS — Z9181 History of falling: Secondary | ICD-10-CM | POA: Diagnosis not present

## 2015-04-06 DIAGNOSIS — K869 Disease of pancreas, unspecified: Secondary | ICD-10-CM | POA: Diagnosis not present

## 2015-04-06 DIAGNOSIS — S301XXD Contusion of abdominal wall, subsequent encounter: Secondary | ICD-10-CM | POA: Diagnosis not present

## 2015-04-12 DIAGNOSIS — I10 Essential (primary) hypertension: Secondary | ICD-10-CM | POA: Diagnosis not present

## 2015-05-10 ENCOUNTER — Ambulatory Visit: Payer: Commercial Managed Care - HMO | Admitting: Cardiology

## 2015-06-04 DIAGNOSIS — M1712 Unilateral primary osteoarthritis, left knee: Secondary | ICD-10-CM | POA: Diagnosis not present

## 2015-06-04 DIAGNOSIS — T8484XA Pain due to internal orthopedic prosthetic devices, implants and grafts, initial encounter: Secondary | ICD-10-CM | POA: Diagnosis not present

## 2015-07-19 DIAGNOSIS — R911 Solitary pulmonary nodule: Secondary | ICD-10-CM | POA: Diagnosis not present

## 2015-07-19 DIAGNOSIS — S2232XD Fracture of one rib, left side, subsequent encounter for fracture with routine healing: Secondary | ICD-10-CM | POA: Diagnosis not present

## 2015-07-19 DIAGNOSIS — C50212 Malignant neoplasm of upper-inner quadrant of left female breast: Secondary | ICD-10-CM | POA: Diagnosis not present

## 2015-07-19 DIAGNOSIS — K449 Diaphragmatic hernia without obstruction or gangrene: Secondary | ICD-10-CM | POA: Diagnosis not present

## 2015-07-19 DIAGNOSIS — J90 Pleural effusion, not elsewhere classified: Secondary | ICD-10-CM | POA: Diagnosis not present

## 2015-07-20 DIAGNOSIS — C50212 Malignant neoplasm of upper-inner quadrant of left female breast: Secondary | ICD-10-CM | POA: Diagnosis not present

## 2015-07-20 DIAGNOSIS — Z85118 Personal history of other malignant neoplasm of bronchus and lung: Secondary | ICD-10-CM | POA: Diagnosis not present

## 2015-07-20 DIAGNOSIS — R918 Other nonspecific abnormal finding of lung field: Secondary | ICD-10-CM | POA: Diagnosis not present

## 2015-07-20 DIAGNOSIS — Z79811 Long term (current) use of aromatase inhibitors: Secondary | ICD-10-CM | POA: Diagnosis not present

## 2015-07-26 ENCOUNTER — Other Ambulatory Visit: Payer: Self-pay | Admitting: Cardiology

## 2015-07-26 NOTE — Telephone Encounter (Signed)
Rx(s) sent to pharmacy electronically.  

## 2015-07-27 DIAGNOSIS — C50919 Malignant neoplasm of unspecified site of unspecified female breast: Secondary | ICD-10-CM | POA: Diagnosis not present

## 2015-07-27 DIAGNOSIS — J42 Unspecified chronic bronchitis: Secondary | ICD-10-CM | POA: Diagnosis not present

## 2015-07-27 DIAGNOSIS — R031 Nonspecific low blood-pressure reading: Secondary | ICD-10-CM | POA: Diagnosis not present

## 2015-07-27 DIAGNOSIS — R911 Solitary pulmonary nodule: Secondary | ICD-10-CM | POA: Diagnosis not present

## 2015-07-27 DIAGNOSIS — I480 Paroxysmal atrial fibrillation: Secondary | ICD-10-CM | POA: Diagnosis not present

## 2015-07-27 DIAGNOSIS — Z9889 Other specified postprocedural states: Secondary | ICD-10-CM | POA: Diagnosis not present

## 2015-07-27 DIAGNOSIS — J90 Pleural effusion, not elsewhere classified: Secondary | ICD-10-CM | POA: Diagnosis not present

## 2015-07-27 DIAGNOSIS — E876 Hypokalemia: Secondary | ICD-10-CM | POA: Diagnosis not present

## 2015-07-28 DIAGNOSIS — R55 Syncope and collapse: Secondary | ICD-10-CM | POA: Diagnosis not present

## 2015-07-28 DIAGNOSIS — M19042 Primary osteoarthritis, left hand: Secondary | ICD-10-CM | POA: Diagnosis not present

## 2015-07-28 DIAGNOSIS — I480 Paroxysmal atrial fibrillation: Secondary | ICD-10-CM | POA: Diagnosis not present

## 2015-07-28 DIAGNOSIS — J189 Pneumonia, unspecified organism: Secondary | ICD-10-CM | POA: Diagnosis not present

## 2015-07-28 DIAGNOSIS — J84116 Cryptogenic organizing pneumonia: Secondary | ICD-10-CM | POA: Diagnosis not present

## 2015-07-28 DIAGNOSIS — F419 Anxiety disorder, unspecified: Secondary | ICD-10-CM | POA: Diagnosis not present

## 2015-07-28 DIAGNOSIS — I10 Essential (primary) hypertension: Secondary | ICD-10-CM | POA: Diagnosis not present

## 2015-07-28 DIAGNOSIS — M19041 Primary osteoarthritis, right hand: Secondary | ICD-10-CM | POA: Diagnosis not present

## 2015-07-28 DIAGNOSIS — R918 Other nonspecific abnormal finding of lung field: Secondary | ICD-10-CM | POA: Diagnosis not present

## 2015-07-28 DIAGNOSIS — T462X5A Adverse effect of other antidysrhythmic drugs, initial encounter: Secondary | ICD-10-CM | POA: Diagnosis not present

## 2015-07-28 DIAGNOSIS — J984 Other disorders of lung: Secondary | ICD-10-CM | POA: Diagnosis not present

## 2015-07-28 DIAGNOSIS — R001 Bradycardia, unspecified: Secondary | ICD-10-CM | POA: Diagnosis not present

## 2015-07-28 DIAGNOSIS — J8489 Other specified interstitial pulmonary diseases: Secondary | ICD-10-CM | POA: Diagnosis not present

## 2015-07-28 DIAGNOSIS — J704 Drug-induced interstitial lung disorders, unspecified: Secondary | ICD-10-CM | POA: Diagnosis not present

## 2015-07-28 DIAGNOSIS — I495 Sick sinus syndrome: Secondary | ICD-10-CM | POA: Diagnosis not present

## 2015-07-28 DIAGNOSIS — Z9981 Dependence on supplemental oxygen: Secondary | ICD-10-CM | POA: Diagnosis not present

## 2015-07-28 DIAGNOSIS — C50919 Malignant neoplasm of unspecified site of unspecified female breast: Secondary | ICD-10-CM | POA: Diagnosis not present

## 2015-08-03 DIAGNOSIS — T462X5D Adverse effect of other antidysrhythmic drugs, subsequent encounter: Secondary | ICD-10-CM | POA: Diagnosis not present

## 2015-08-03 DIAGNOSIS — I10 Essential (primary) hypertension: Secondary | ICD-10-CM | POA: Diagnosis not present

## 2015-08-03 DIAGNOSIS — I459 Conduction disorder, unspecified: Secondary | ICD-10-CM | POA: Diagnosis not present

## 2015-08-03 DIAGNOSIS — R55 Syncope and collapse: Secondary | ICD-10-CM | POA: Diagnosis not present

## 2015-08-03 DIAGNOSIS — J984 Other disorders of lung: Secondary | ICD-10-CM | POA: Diagnosis not present

## 2015-08-03 DIAGNOSIS — R001 Bradycardia, unspecified: Secondary | ICD-10-CM | POA: Diagnosis not present

## 2015-08-03 DIAGNOSIS — Z9181 History of falling: Secondary | ICD-10-CM | POA: Diagnosis not present

## 2015-08-03 DIAGNOSIS — I480 Paroxysmal atrial fibrillation: Secondary | ICD-10-CM | POA: Diagnosis not present

## 2015-08-03 DIAGNOSIS — J8489 Other specified interstitial pulmonary diseases: Secondary | ICD-10-CM | POA: Diagnosis not present

## 2015-08-05 DIAGNOSIS — Z9181 History of falling: Secondary | ICD-10-CM | POA: Diagnosis not present

## 2015-08-05 DIAGNOSIS — T462X5D Adverse effect of other antidysrhythmic drugs, subsequent encounter: Secondary | ICD-10-CM | POA: Diagnosis not present

## 2015-08-05 DIAGNOSIS — I10 Essential (primary) hypertension: Secondary | ICD-10-CM | POA: Diagnosis not present

## 2015-08-05 DIAGNOSIS — J8489 Other specified interstitial pulmonary diseases: Secondary | ICD-10-CM | POA: Diagnosis not present

## 2015-08-05 DIAGNOSIS — R55 Syncope and collapse: Secondary | ICD-10-CM | POA: Diagnosis not present

## 2015-08-05 DIAGNOSIS — J984 Other disorders of lung: Secondary | ICD-10-CM | POA: Diagnosis not present

## 2015-08-05 DIAGNOSIS — R001 Bradycardia, unspecified: Secondary | ICD-10-CM | POA: Diagnosis not present

## 2015-08-05 DIAGNOSIS — I459 Conduction disorder, unspecified: Secondary | ICD-10-CM | POA: Diagnosis not present

## 2015-08-05 DIAGNOSIS — I480 Paroxysmal atrial fibrillation: Secondary | ICD-10-CM | POA: Diagnosis not present

## 2015-08-09 DIAGNOSIS — I480 Paroxysmal atrial fibrillation: Secondary | ICD-10-CM | POA: Diagnosis not present

## 2015-08-10 ENCOUNTER — Ambulatory Visit: Payer: Commercial Managed Care - HMO | Admitting: Cardiology

## 2015-08-10 ENCOUNTER — Telehealth: Payer: Self-pay | Admitting: Cardiology

## 2015-08-10 DIAGNOSIS — R55 Syncope and collapse: Secondary | ICD-10-CM | POA: Diagnosis not present

## 2015-08-10 DIAGNOSIS — R001 Bradycardia, unspecified: Secondary | ICD-10-CM | POA: Diagnosis not present

## 2015-08-10 DIAGNOSIS — I459 Conduction disorder, unspecified: Secondary | ICD-10-CM | POA: Diagnosis not present

## 2015-08-10 DIAGNOSIS — I10 Essential (primary) hypertension: Secondary | ICD-10-CM | POA: Diagnosis not present

## 2015-08-10 DIAGNOSIS — I480 Paroxysmal atrial fibrillation: Secondary | ICD-10-CM | POA: Diagnosis not present

## 2015-08-10 DIAGNOSIS — J8489 Other specified interstitial pulmonary diseases: Secondary | ICD-10-CM | POA: Diagnosis not present

## 2015-08-10 DIAGNOSIS — T462X5D Adverse effect of other antidysrhythmic drugs, subsequent encounter: Secondary | ICD-10-CM | POA: Diagnosis not present

## 2015-08-10 DIAGNOSIS — Z9181 History of falling: Secondary | ICD-10-CM | POA: Diagnosis not present

## 2015-08-10 DIAGNOSIS — J984 Other disorders of lung: Secondary | ICD-10-CM | POA: Diagnosis not present

## 2015-08-10 NOTE — Telephone Encounter (Signed)
Patient arrived accompanied by son at [facility] with what she thought was an appointment with Dr. Ellyn Hack.  Dr. Ellyn Hack was unavailable this afternoon.  I advised I would call [facility] where appointment was with Lyda Jester and let them know they were on their way.  I advised the son and the patient that Dr. Ellyn Hack did not have any openings until April 2017.  They declined to go to [facility].  Patient stated she had too many things wrong with her that Dr. Ellyn Hack knows about and this PA didn't.  The son stated he would just take her home and give her her blood pressure medications if her blood pressures got too high.  I also informed Satira Sark (office supervisor) of this situation.

## 2015-08-12 DIAGNOSIS — R001 Bradycardia, unspecified: Secondary | ICD-10-CM | POA: Diagnosis not present

## 2015-08-12 DIAGNOSIS — I480 Paroxysmal atrial fibrillation: Secondary | ICD-10-CM | POA: Diagnosis not present

## 2015-08-12 DIAGNOSIS — I10 Essential (primary) hypertension: Secondary | ICD-10-CM | POA: Diagnosis not present

## 2015-08-12 DIAGNOSIS — Z9181 History of falling: Secondary | ICD-10-CM | POA: Diagnosis not present

## 2015-08-12 DIAGNOSIS — R55 Syncope and collapse: Secondary | ICD-10-CM | POA: Diagnosis not present

## 2015-08-12 DIAGNOSIS — J984 Other disorders of lung: Secondary | ICD-10-CM | POA: Diagnosis not present

## 2015-08-12 DIAGNOSIS — I459 Conduction disorder, unspecified: Secondary | ICD-10-CM | POA: Diagnosis not present

## 2015-08-12 DIAGNOSIS — T462X5D Adverse effect of other antidysrhythmic drugs, subsequent encounter: Secondary | ICD-10-CM | POA: Diagnosis not present

## 2015-08-12 DIAGNOSIS — J8489 Other specified interstitial pulmonary diseases: Secondary | ICD-10-CM | POA: Diagnosis not present

## 2015-08-13 DIAGNOSIS — J984 Other disorders of lung: Secondary | ICD-10-CM | POA: Diagnosis not present

## 2015-08-13 DIAGNOSIS — I459 Conduction disorder, unspecified: Secondary | ICD-10-CM | POA: Diagnosis not present

## 2015-08-13 DIAGNOSIS — I10 Essential (primary) hypertension: Secondary | ICD-10-CM | POA: Diagnosis not present

## 2015-08-13 DIAGNOSIS — T462X5D Adverse effect of other antidysrhythmic drugs, subsequent encounter: Secondary | ICD-10-CM | POA: Diagnosis not present

## 2015-08-13 DIAGNOSIS — R55 Syncope and collapse: Secondary | ICD-10-CM | POA: Diagnosis not present

## 2015-08-13 DIAGNOSIS — Z9181 History of falling: Secondary | ICD-10-CM | POA: Diagnosis not present

## 2015-08-13 DIAGNOSIS — R001 Bradycardia, unspecified: Secondary | ICD-10-CM | POA: Diagnosis not present

## 2015-08-13 DIAGNOSIS — I480 Paroxysmal atrial fibrillation: Secondary | ICD-10-CM | POA: Diagnosis not present

## 2015-08-13 DIAGNOSIS — J8489 Other specified interstitial pulmonary diseases: Secondary | ICD-10-CM | POA: Diagnosis not present

## 2015-08-16 DIAGNOSIS — T462X5D Adverse effect of other antidysrhythmic drugs, subsequent encounter: Secondary | ICD-10-CM | POA: Diagnosis not present

## 2015-08-16 DIAGNOSIS — Z9181 History of falling: Secondary | ICD-10-CM | POA: Diagnosis not present

## 2015-08-16 DIAGNOSIS — R001 Bradycardia, unspecified: Secondary | ICD-10-CM | POA: Diagnosis not present

## 2015-08-16 DIAGNOSIS — I480 Paroxysmal atrial fibrillation: Secondary | ICD-10-CM | POA: Diagnosis not present

## 2015-08-16 DIAGNOSIS — I459 Conduction disorder, unspecified: Secondary | ICD-10-CM | POA: Diagnosis not present

## 2015-08-16 DIAGNOSIS — I10 Essential (primary) hypertension: Secondary | ICD-10-CM | POA: Diagnosis not present

## 2015-08-16 DIAGNOSIS — J984 Other disorders of lung: Secondary | ICD-10-CM | POA: Diagnosis not present

## 2015-08-16 DIAGNOSIS — J8489 Other specified interstitial pulmonary diseases: Secondary | ICD-10-CM | POA: Diagnosis not present

## 2015-08-16 DIAGNOSIS — R55 Syncope and collapse: Secondary | ICD-10-CM | POA: Diagnosis not present

## 2015-08-17 ENCOUNTER — Encounter: Payer: Self-pay | Admitting: Cardiology

## 2015-08-17 ENCOUNTER — Ambulatory Visit (INDEPENDENT_AMBULATORY_CARE_PROVIDER_SITE_OTHER): Payer: Commercial Managed Care - HMO | Admitting: Cardiology

## 2015-08-17 VITALS — BP 150/70 | HR 66 | Ht 60.0 in | Wt 136.0 lb

## 2015-08-17 DIAGNOSIS — J8489 Other specified interstitial pulmonary diseases: Secondary | ICD-10-CM | POA: Diagnosis not present

## 2015-08-17 DIAGNOSIS — J984 Other disorders of lung: Secondary | ICD-10-CM | POA: Diagnosis not present

## 2015-08-17 DIAGNOSIS — T462X5A Adverse effect of other antidysrhythmic drugs, initial encounter: Secondary | ICD-10-CM

## 2015-08-17 DIAGNOSIS — I480 Paroxysmal atrial fibrillation: Secondary | ICD-10-CM | POA: Diagnosis not present

## 2015-08-17 DIAGNOSIS — I10 Essential (primary) hypertension: Secondary | ICD-10-CM | POA: Diagnosis not present

## 2015-08-17 DIAGNOSIS — Z9181 History of falling: Secondary | ICD-10-CM | POA: Diagnosis not present

## 2015-08-17 DIAGNOSIS — R001 Bradycardia, unspecified: Secondary | ICD-10-CM | POA: Diagnosis not present

## 2015-08-17 DIAGNOSIS — T462X5D Adverse effect of other antidysrhythmic drugs, subsequent encounter: Secondary | ICD-10-CM | POA: Diagnosis not present

## 2015-08-17 DIAGNOSIS — R55 Syncope and collapse: Secondary | ICD-10-CM | POA: Diagnosis not present

## 2015-08-17 DIAGNOSIS — I459 Conduction disorder, unspecified: Secondary | ICD-10-CM | POA: Diagnosis not present

## 2015-08-17 MED ORDER — VALSARTAN 320 MG PO TABS: 320.0000 mg | ORAL_TABLET | Freq: Every morning | ORAL | Status: DC

## 2015-08-17 NOTE — Assessment & Plan Note (Signed)
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

## 2015-08-17 NOTE — Assessment & Plan Note (Signed)
Labile hypertension but her B/P today is the best its been in a while- on Diovan 320 mg only.

## 2015-08-17 NOTE — Assessment & Plan Note (Signed)
Stopped Feb 2017 

## 2015-08-17 NOTE — Progress Notes (Signed)
08/17/2015 Sara [name]   06-01-1926  [id number]  Primary Physician PROVIDER NOT IN SYSTEM Primary Cardiologist: Dr Ellyn Hack  HPI:  Pleasant 82 year old female, followed by Dr. Ellyn Hack, Salena Saner a history of paroxysmal atrial fibrillation that has then well maintained on amiodarone therapy. She is not on oral anticoagulation given her advanced age and high fall risk. She also has a history of poorly controlled hypertension and multiple drug intolerances. We last saw her in Aug 2016. In Sept 2016 she fell and fractured two ribs. She the developed pneumonia and was found to have some nodules on lung scan which resulted in biopsy. After this she was discharged by mistake on Diovan/ HCTZ as well as Diovan and a diuretic. 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 She saw a pulmonologist and was taken off Amiodarone and placed on steroids. She is in the office today with her son for follow up. She says the steroids are "driving me crazy" but otherwise she seems to be doing well. Her B/P is actually the best its been in awhile.    Current Outpatient Prescriptions  Medication Sig Dispense Refill  . acetaminophen (TYLENOL) 500 MG tablet Take 500 mg by mouth daily as needed.    Marland Kitchen anastrozole (ARIMIDEX) 1 MG tablet Take 1 mg by mouth daily.    Marland Kitchen aspirin 81 MG tablet Take 81 mg by mouth daily.    . bisacodyl (DULCOLAX) 10 MG suppository Place 10 mg rectally as needed for moderate constipation.    . clorazepate (TRANXENE) 3.75 MG tablet Take 3.75 mg by mouth 2 (two) times daily as needed for anxiety.    Marland Kitchen esomeprazole (NEXIUM) 40 MG capsule Take 40 mg by mouth daily before breakfast.    . Omega-3 Fatty Acids (FISH OIL) 1000 MG CAPS Take by mouth.    . pantoprazole (PROTONIX) 40 MG tablet Take 40 mg by mouth daily.   0  . predniSONE (DELTASONE) 20 MG tablet 60 mg daily.  0  . traMADol (ULTRAM) 50 MG tablet Take 50 mg by mouth every  4 (four) hours as needed for moderate pain.   0  . valsartan (DIOVAN) 320 MG tablet Take 1 tablet (320 mg total) by mouth every morning. 90 tablet 3   No current facility-administered medications for this visit.    Allergies  Allergen Reactions  . Amiodarone Shortness Of Breath    Stopped in Feb 2017 secondary to pulmonary complications  . Benicar [Olmesartan]     Unclear of the intolerance  . Bystolic [Nebivolol Hcl]   . Clonidine Derivatives   . Exforge [Amlodipine Besylate-Valsartan] Other (See Comments)    Unclear  . Hctz [Hydrochlorothiazide]     Currently taking without problem; question if this has to do with hypokalemia  . Hydralazine Other (See Comments)    Also tolerated, but had orthostatic changes on standing medication  . Lisinopril   . Multaq [Dronedarone] Nausea And Vomiting  . Rythmol [Propafenone]   . Statins   . Tekturna [Aliskiren]   . Coreg [Carvedilol] Other (See Comments)    fatigue    Social History   Social History  . Marital Status: Widowed    Spouse Name: N/A  . Number of Children: 1  . Years of Education: N/A   Occupational History  .     Social History Main Topics  . Smoking status: Never Smoker   . Smokeless tobacco: Not on file  .  Alcohol Use: No  . Drug Use: No  . Sexual Activity: Not on file   Other Topics Concern  . Not on file   Social History Narrative   She is a widowed mother of one and grandmother of one.  She does not exercise.  She does not smoke and does not drink alcohol.     Review of Systems: General: negative for chills, fever, night sweats or weight changes.  Cardiovascular: negative for chest pain, dyspnea on exertion, edema, orthopnea, palpitations, paroxysmal nocturnal dyspnea or shortness of breath Dermatological: negative for rash Respiratory: negative for cough or wheezing Urologic: negative for hematuria Abdominal: negative for nausea, vomiting, diarrhea, bright red blood per rectum, melena, or  hematemesis Neurologic: negative for visual changes, syncope, or dizziness All other systems reviewed and are otherwise negative except as noted above.    Blood pressure 150/70, pulse 66, height 5' (1.524 m), weight 136 lb (61.689 kg).  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 She will follow up with pulmonary Next week. I suspect she will go back into AF at some point- options will be limited then. F/U Dr Ellyn Hack in two months  Erlene Quan PA-C 08/17/2015 4:58 PM

## 2015-08-17 NOTE — Patient Instructions (Addendum)
Medication Instructions:  Your physician recommends that you continue on your current medications as directed. Please refer to the Current Medication list given to you today.   Labwork: None ordered  Testing/Procedures: NONE ORDERED  Follow-Up: Your physician recommends that you schedule a follow-up appointment in:   2 MONTHS WITH DR [facility]   Any Other Special Instructions Will Be Listed Below (If Applicable).    If you need a refill on your cardiac medications before your next appointment, please call your pharmacy.

## 2015-08-18 DIAGNOSIS — I480 Paroxysmal atrial fibrillation: Secondary | ICD-10-CM | POA: Diagnosis not present

## 2015-08-18 DIAGNOSIS — T462X5D Adverse effect of other antidysrhythmic drugs, subsequent encounter: Secondary | ICD-10-CM | POA: Diagnosis not present

## 2015-08-18 DIAGNOSIS — R001 Bradycardia, unspecified: Secondary | ICD-10-CM | POA: Diagnosis not present

## 2015-08-18 DIAGNOSIS — I459 Conduction disorder, unspecified: Secondary | ICD-10-CM | POA: Diagnosis not present

## 2015-08-18 DIAGNOSIS — Z9181 History of falling: Secondary | ICD-10-CM | POA: Diagnosis not present

## 2015-08-18 DIAGNOSIS — R55 Syncope and collapse: Secondary | ICD-10-CM | POA: Diagnosis not present

## 2015-08-18 DIAGNOSIS — I10 Essential (primary) hypertension: Secondary | ICD-10-CM | POA: Diagnosis not present

## 2015-08-18 DIAGNOSIS — J8489 Other specified interstitial pulmonary diseases: Secondary | ICD-10-CM | POA: Diagnosis not present

## 2015-08-18 DIAGNOSIS — J984 Other disorders of lung: Secondary | ICD-10-CM | POA: Diagnosis not present

## 2015-08-20 DIAGNOSIS — T462X5D Adverse effect of other antidysrhythmic drugs, subsequent encounter: Secondary | ICD-10-CM | POA: Diagnosis not present

## 2015-08-20 DIAGNOSIS — R55 Syncope and collapse: Secondary | ICD-10-CM | POA: Diagnosis not present

## 2015-08-20 DIAGNOSIS — J8489 Other specified interstitial pulmonary diseases: Secondary | ICD-10-CM | POA: Diagnosis not present

## 2015-08-20 DIAGNOSIS — J984 Other disorders of lung: Secondary | ICD-10-CM | POA: Diagnosis not present

## 2015-08-20 DIAGNOSIS — R001 Bradycardia, unspecified: Secondary | ICD-10-CM | POA: Diagnosis not present

## 2015-08-20 DIAGNOSIS — Z9181 History of falling: Secondary | ICD-10-CM | POA: Diagnosis not present

## 2015-08-20 DIAGNOSIS — I459 Conduction disorder, unspecified: Secondary | ICD-10-CM | POA: Diagnosis not present

## 2015-08-20 DIAGNOSIS — I10 Essential (primary) hypertension: Secondary | ICD-10-CM | POA: Diagnosis not present

## 2015-08-20 DIAGNOSIS — I480 Paroxysmal atrial fibrillation: Secondary | ICD-10-CM | POA: Diagnosis not present

## 2015-08-21 DIAGNOSIS — R569 Unspecified convulsions: Secondary | ICD-10-CM | POA: Diagnosis not present

## 2015-08-21 DIAGNOSIS — R112 Nausea with vomiting, unspecified: Secondary | ICD-10-CM | POA: Diagnosis not present

## 2015-08-21 DIAGNOSIS — E860 Dehydration: Secondary | ICD-10-CM | POA: Diagnosis not present

## 2015-08-21 DIAGNOSIS — R001 Bradycardia, unspecified: Secondary | ICD-10-CM | POA: Diagnosis not present

## 2015-08-21 DIAGNOSIS — I4891 Unspecified atrial fibrillation: Secondary | ICD-10-CM | POA: Diagnosis not present

## 2015-08-21 DIAGNOSIS — Z7401 Bed confinement status: Secondary | ICD-10-CM | POA: Diagnosis not present

## 2015-08-21 DIAGNOSIS — J189 Pneumonia, unspecified organism: Secondary | ICD-10-CM | POA: Diagnosis not present

## 2015-08-21 DIAGNOSIS — J9811 Atelectasis: Secondary | ICD-10-CM | POA: Diagnosis not present

## 2015-08-21 DIAGNOSIS — I501 Left ventricular failure: Secondary | ICD-10-CM | POA: Diagnosis not present

## 2015-08-21 DIAGNOSIS — R945 Abnormal results of liver function studies: Secondary | ICD-10-CM | POA: Diagnosis not present

## 2015-08-21 DIAGNOSIS — R7989 Other specified abnormal findings of blood chemistry: Secondary | ICD-10-CM | POA: Diagnosis not present

## 2015-08-21 DIAGNOSIS — R739 Hyperglycemia, unspecified: Secondary | ICD-10-CM | POA: Diagnosis not present

## 2015-08-21 DIAGNOSIS — I2699 Other pulmonary embolism without acute cor pulmonale: Secondary | ICD-10-CM | POA: Diagnosis not present

## 2015-08-21 DIAGNOSIS — R404 Transient alteration of awareness: Secondary | ICD-10-CM | POA: Diagnosis not present

## 2015-08-21 DIAGNOSIS — R270 Ataxia, unspecified: Secondary | ICD-10-CM | POA: Diagnosis not present

## 2015-08-21 DIAGNOSIS — R55 Syncope and collapse: Secondary | ICD-10-CM | POA: Diagnosis not present

## 2015-08-21 DIAGNOSIS — I10 Essential (primary) hypertension: Secondary | ICD-10-CM | POA: Diagnosis not present

## 2015-08-21 DIAGNOSIS — M25462 Effusion, left knee: Secondary | ICD-10-CM | POA: Diagnosis not present

## 2015-08-21 DIAGNOSIS — J8489 Other specified interstitial pulmonary diseases: Secondary | ICD-10-CM | POA: Diagnosis not present

## 2015-08-27 DIAGNOSIS — R739 Hyperglycemia, unspecified: Secondary | ICD-10-CM | POA: Diagnosis not present

## 2015-08-27 DIAGNOSIS — I119 Hypertensive heart disease without heart failure: Secondary | ICD-10-CM | POA: Diagnosis not present

## 2015-08-27 DIAGNOSIS — Z7401 Bed confinement status: Secondary | ICD-10-CM | POA: Diagnosis not present

## 2015-08-27 DIAGNOSIS — R945 Abnormal results of liver function studies: Secondary | ICD-10-CM | POA: Diagnosis not present

## 2015-08-27 DIAGNOSIS — J189 Pneumonia, unspecified organism: Secondary | ICD-10-CM | POA: Diagnosis not present

## 2015-08-27 DIAGNOSIS — I2699 Other pulmonary embolism without acute cor pulmonale: Secondary | ICD-10-CM | POA: Diagnosis not present

## 2015-08-27 DIAGNOSIS — I10 Essential (primary) hypertension: Secondary | ICD-10-CM | POA: Diagnosis not present

## 2015-08-27 DIAGNOSIS — R262 Difficulty in walking, not elsewhere classified: Secondary | ICD-10-CM | POA: Diagnosis not present

## 2015-08-27 DIAGNOSIS — R270 Ataxia, unspecified: Secondary | ICD-10-CM | POA: Diagnosis not present

## 2015-08-27 DIAGNOSIS — R55 Syncope and collapse: Secondary | ICD-10-CM | POA: Diagnosis not present

## 2015-08-31 DIAGNOSIS — I119 Hypertensive heart disease without heart failure: Secondary | ICD-10-CM | POA: Diagnosis not present

## 2015-08-31 DIAGNOSIS — R262 Difficulty in walking, not elsewhere classified: Secondary | ICD-10-CM | POA: Diagnosis not present

## 2015-08-31 DIAGNOSIS — J189 Pneumonia, unspecified organism: Secondary | ICD-10-CM | POA: Diagnosis not present

## 2015-08-31 DIAGNOSIS — I2699 Other pulmonary embolism without acute cor pulmonale: Secondary | ICD-10-CM | POA: Diagnosis not present

## 2015-09-14 DIAGNOSIS — J84116 Cryptogenic organizing pneumonia: Secondary | ICD-10-CM | POA: Diagnosis not present

## 2015-09-15 DIAGNOSIS — I10 Essential (primary) hypertension: Secondary | ICD-10-CM | POA: Diagnosis not present

## 2015-09-15 DIAGNOSIS — I480 Paroxysmal atrial fibrillation: Secondary | ICD-10-CM | POA: Diagnosis not present

## 2015-09-15 DIAGNOSIS — R55 Syncope and collapse: Secondary | ICD-10-CM | POA: Diagnosis not present

## 2015-09-15 DIAGNOSIS — R001 Bradycardia, unspecified: Secondary | ICD-10-CM | POA: Diagnosis not present

## 2015-09-15 DIAGNOSIS — T462X5D Adverse effect of other antidysrhythmic drugs, subsequent encounter: Secondary | ICD-10-CM | POA: Diagnosis not present

## 2015-09-15 DIAGNOSIS — J8489 Other specified interstitial pulmonary diseases: Secondary | ICD-10-CM | POA: Diagnosis not present

## 2015-09-15 DIAGNOSIS — I459 Conduction disorder, unspecified: Secondary | ICD-10-CM | POA: Diagnosis not present

## 2015-09-15 DIAGNOSIS — Z9181 History of falling: Secondary | ICD-10-CM | POA: Diagnosis not present

## 2015-09-15 DIAGNOSIS — J984 Other disorders of lung: Secondary | ICD-10-CM | POA: Diagnosis not present

## 2015-09-17 DIAGNOSIS — J8489 Other specified interstitial pulmonary diseases: Secondary | ICD-10-CM | POA: Diagnosis not present

## 2015-09-17 DIAGNOSIS — R001 Bradycardia, unspecified: Secondary | ICD-10-CM | POA: Diagnosis not present

## 2015-09-17 DIAGNOSIS — I459 Conduction disorder, unspecified: Secondary | ICD-10-CM | POA: Diagnosis not present

## 2015-09-17 DIAGNOSIS — I10 Essential (primary) hypertension: Secondary | ICD-10-CM | POA: Diagnosis not present

## 2015-09-17 DIAGNOSIS — T462X5D Adverse effect of other antidysrhythmic drugs, subsequent encounter: Secondary | ICD-10-CM | POA: Diagnosis not present

## 2015-09-17 DIAGNOSIS — J984 Other disorders of lung: Secondary | ICD-10-CM | POA: Diagnosis not present

## 2015-09-17 DIAGNOSIS — R55 Syncope and collapse: Secondary | ICD-10-CM | POA: Diagnosis not present

## 2015-09-17 DIAGNOSIS — Z9181 History of falling: Secondary | ICD-10-CM | POA: Diagnosis not present

## 2015-09-17 DIAGNOSIS — I480 Paroxysmal atrial fibrillation: Secondary | ICD-10-CM | POA: Diagnosis not present

## 2015-09-20 DIAGNOSIS — Z9181 History of falling: Secondary | ICD-10-CM | POA: Diagnosis not present

## 2015-09-20 DIAGNOSIS — R001 Bradycardia, unspecified: Secondary | ICD-10-CM | POA: Diagnosis not present

## 2015-09-20 DIAGNOSIS — I480 Paroxysmal atrial fibrillation: Secondary | ICD-10-CM | POA: Diagnosis not present

## 2015-09-20 DIAGNOSIS — I459 Conduction disorder, unspecified: Secondary | ICD-10-CM | POA: Diagnosis not present

## 2015-09-20 DIAGNOSIS — J8489 Other specified interstitial pulmonary diseases: Secondary | ICD-10-CM | POA: Diagnosis not present

## 2015-09-20 DIAGNOSIS — R55 Syncope and collapse: Secondary | ICD-10-CM | POA: Diagnosis not present

## 2015-09-20 DIAGNOSIS — J984 Other disorders of lung: Secondary | ICD-10-CM | POA: Diagnosis not present

## 2015-09-20 DIAGNOSIS — I10 Essential (primary) hypertension: Secondary | ICD-10-CM | POA: Diagnosis not present

## 2015-09-20 DIAGNOSIS — T462X5D Adverse effect of other antidysrhythmic drugs, subsequent encounter: Secondary | ICD-10-CM | POA: Diagnosis not present

## 2015-09-21 ENCOUNTER — Ambulatory Visit: Payer: Commercial Managed Care - HMO | Admitting: Cardiology

## 2015-09-21 DIAGNOSIS — I10 Essential (primary) hypertension: Secondary | ICD-10-CM | POA: Diagnosis not present

## 2015-09-21 DIAGNOSIS — R001 Bradycardia, unspecified: Secondary | ICD-10-CM | POA: Diagnosis not present

## 2015-09-21 DIAGNOSIS — T462X5D Adverse effect of other antidysrhythmic drugs, subsequent encounter: Secondary | ICD-10-CM | POA: Diagnosis not present

## 2015-09-21 DIAGNOSIS — I459 Conduction disorder, unspecified: Secondary | ICD-10-CM | POA: Diagnosis not present

## 2015-09-21 DIAGNOSIS — J984 Other disorders of lung: Secondary | ICD-10-CM | POA: Diagnosis not present

## 2015-09-21 DIAGNOSIS — I480 Paroxysmal atrial fibrillation: Secondary | ICD-10-CM | POA: Diagnosis not present

## 2015-09-21 DIAGNOSIS — R55 Syncope and collapse: Secondary | ICD-10-CM | POA: Diagnosis not present

## 2015-09-21 DIAGNOSIS — Z9181 History of falling: Secondary | ICD-10-CM | POA: Diagnosis not present

## 2015-09-21 DIAGNOSIS — J8489 Other specified interstitial pulmonary diseases: Secondary | ICD-10-CM | POA: Diagnosis not present

## 2015-09-22 DIAGNOSIS — Z9181 History of falling: Secondary | ICD-10-CM | POA: Diagnosis not present

## 2015-09-22 DIAGNOSIS — R55 Syncope and collapse: Secondary | ICD-10-CM | POA: Diagnosis not present

## 2015-09-22 DIAGNOSIS — I459 Conduction disorder, unspecified: Secondary | ICD-10-CM | POA: Diagnosis not present

## 2015-09-22 DIAGNOSIS — I10 Essential (primary) hypertension: Secondary | ICD-10-CM | POA: Diagnosis not present

## 2015-09-22 DIAGNOSIS — J984 Other disorders of lung: Secondary | ICD-10-CM | POA: Diagnosis not present

## 2015-09-22 DIAGNOSIS — T462X5D Adverse effect of other antidysrhythmic drugs, subsequent encounter: Secondary | ICD-10-CM | POA: Diagnosis not present

## 2015-09-22 DIAGNOSIS — R001 Bradycardia, unspecified: Secondary | ICD-10-CM | POA: Diagnosis not present

## 2015-09-22 DIAGNOSIS — I480 Paroxysmal atrial fibrillation: Secondary | ICD-10-CM | POA: Diagnosis not present

## 2015-09-22 DIAGNOSIS — J8489 Other specified interstitial pulmonary diseases: Secondary | ICD-10-CM | POA: Diagnosis not present

## 2015-09-24 DIAGNOSIS — I2699 Other pulmonary embolism without acute cor pulmonale: Secondary | ICD-10-CM | POA: Diagnosis not present

## 2015-09-24 DIAGNOSIS — J8489 Other specified interstitial pulmonary diseases: Secondary | ICD-10-CM | POA: Diagnosis not present

## 2015-09-24 DIAGNOSIS — R001 Bradycardia, unspecified: Secondary | ICD-10-CM | POA: Diagnosis not present

## 2015-09-24 DIAGNOSIS — I459 Conduction disorder, unspecified: Secondary | ICD-10-CM | POA: Diagnosis not present

## 2015-09-24 DIAGNOSIS — R55 Syncope and collapse: Secondary | ICD-10-CM | POA: Diagnosis not present

## 2015-09-24 DIAGNOSIS — T462X5D Adverse effect of other antidysrhythmic drugs, subsequent encounter: Secondary | ICD-10-CM | POA: Diagnosis not present

## 2015-09-24 DIAGNOSIS — I480 Paroxysmal atrial fibrillation: Secondary | ICD-10-CM | POA: Diagnosis not present

## 2015-09-24 DIAGNOSIS — J984 Other disorders of lung: Secondary | ICD-10-CM | POA: Diagnosis not present

## 2015-09-24 DIAGNOSIS — Z9181 History of falling: Secondary | ICD-10-CM | POA: Diagnosis not present

## 2015-09-24 DIAGNOSIS — I10 Essential (primary) hypertension: Secondary | ICD-10-CM | POA: Diagnosis not present

## 2015-09-28 DIAGNOSIS — T462X5D Adverse effect of other antidysrhythmic drugs, subsequent encounter: Secondary | ICD-10-CM | POA: Diagnosis not present

## 2015-09-28 DIAGNOSIS — I10 Essential (primary) hypertension: Secondary | ICD-10-CM | POA: Diagnosis not present

## 2015-09-28 DIAGNOSIS — I480 Paroxysmal atrial fibrillation: Secondary | ICD-10-CM | POA: Diagnosis not present

## 2015-09-28 DIAGNOSIS — Z9181 History of falling: Secondary | ICD-10-CM | POA: Diagnosis not present

## 2015-09-28 DIAGNOSIS — R55 Syncope and collapse: Secondary | ICD-10-CM | POA: Diagnosis not present

## 2015-09-28 DIAGNOSIS — I459 Conduction disorder, unspecified: Secondary | ICD-10-CM | POA: Diagnosis not present

## 2015-09-28 DIAGNOSIS — J8489 Other specified interstitial pulmonary diseases: Secondary | ICD-10-CM | POA: Diagnosis not present

## 2015-09-28 DIAGNOSIS — J984 Other disorders of lung: Secondary | ICD-10-CM | POA: Diagnosis not present

## 2015-09-28 DIAGNOSIS — R001 Bradycardia, unspecified: Secondary | ICD-10-CM | POA: Diagnosis not present

## 2015-09-29 ENCOUNTER — Ambulatory Visit (INDEPENDENT_AMBULATORY_CARE_PROVIDER_SITE_OTHER): Payer: Commercial Managed Care - HMO | Admitting: Cardiology

## 2015-09-29 ENCOUNTER — Encounter: Payer: Self-pay | Admitting: Cardiology

## 2015-09-29 VITALS — BP 182/88 | HR 96 | Ht 60.0 in | Wt 117.2 lb

## 2015-09-29 DIAGNOSIS — I480 Paroxysmal atrial fibrillation: Secondary | ICD-10-CM

## 2015-09-29 DIAGNOSIS — J984 Other disorders of lung: Secondary | ICD-10-CM

## 2015-09-29 DIAGNOSIS — I10 Essential (primary) hypertension: Secondary | ICD-10-CM | POA: Diagnosis not present

## 2015-09-29 DIAGNOSIS — T462X5A Adverse effect of other antidysrhythmic drugs, initial encounter: Secondary | ICD-10-CM | POA: Diagnosis not present

## 2015-09-29 NOTE — Progress Notes (Signed)
PCP: PROVIDER NOT IN SYSTEM  Clinic Note: Chief Complaint  Patient presents with  . Follow-up  . Numbness    in feet, in hand  . Leg Problem    restless at night  . Atrial Fibrillation    HPI: Sara [name] is a 82 y.o. female with a PMH below who presents today for Delayed annual follow-up for atrial fibrillation.. She is a former long-standing patient of Dr. 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  There was concern that there is possible amiodarone toxicity. She has not been on oral correlation due to her age. She also has long-standing difficult to control hypertension.  I actually last saw her in December 2015. She was doing relatively well at that time. Really oblivious to 20 A. fib sensations. Otherwise stable. No heart failure symptoms. She saw Sara [name] back in August 2016, and she started hydralazine. This apparently is also been stopped.  Sara [name] was last seen in Feb 2017 by Sara [name], Sara [name].  Recent Hospitalizations: She has had several hospitalizations at [name] -- The first time was shortly after seeing Sara [name]. She was driving home with her son and had a near syncopal event. They called 911, and there was concern for possible stroke or seizure. Shortly after going home from that she had a fall and broke some ribs. She was then diagnosed with possible lung cancer on chest x-ray and had a complicated biopsy with hemoptysis and PE. She is also is put on Amicar regulation as result. 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 As a result of all this she was told that she was "overmedicated".   The pulmonologist stopped amiodarone for possible toxicity, but was found out to not be related to that he never restarted it. She has been on steroid taper for BOOP, and did not do really well with  the steroids, but the symptoms have improved.  She's had at least one other episode of the near syncopal spells. There was a suggestion that maybe she has bradycardia. I printed one of the episodes was when the biopsy was being done. A lot of the details, but nothing has really been determined to be the cause.  Studies Reviewed: Unfortunately none of the studies that were done at [name] are available.  Interval History: She presents today a little bit that up with everything that happened in the past. She really has not noted any symptoms of A. fib is irregular rapid heartbeats. She has very fleeting flip-flops, but nothing significant. She has not had a recent episode of the passout spells. She still elevated dyspnea from her lung disease, but has not had any significant resting or exertional chest tightness or pressure. She has chronic swelling from venous stasis. Has not been wearing support hose lately. She is not on diuretic besides HCTZ   No real TIA or amaurosis fugax symptoms. I can't really tell she is truly had syncope. She has had maybe 3 or 4 spells since September of her eyes rolling back in her head and being unresponsive for a few minutes. It would sound like this is a possible syncopal episode, but no etiology has been determined. No melena, hematochezia, hematuria, or epstaxis -with the addition of ELIQUIS. No recent falls.  No claudication.  ROS: A comprehensive was performed. Review of  Systems  Constitutional: Negative for malaise/fatigue and diaphoresis.  HENT: Negative for congestion and nosebleeds.   Respiratory: Positive for cough and shortness of breath.   Cardiovascular: Negative for claudication.       Per history of present illness  Gastrointestinal: Negative for diarrhea and constipation.  Musculoskeletal: Positive for back pain and joint pain. Negative for falls.  Neurological: Positive for tingling (Numbness in her hands and feet) and loss of  consciousness. Negative for dizziness and headaches.       Restless leg type symptoms at night  Endo/Heme/Allergies: Bruises/bleeds easily.    Past Medical History  Diagnosis Date  . Anxiety   . Balance problem     Due to weakened knee  . Ankle edema     Mild, which is intermittent, usually mildly dependent, left slightly greater than the right.  . Arthritis     In her knees  . GERD (gastroesophageal reflux disease)   . Posthemorrhagic anemia   . Hiatal hernia     With GERD  . Hyperlipidemia   . Hypokalemia   . Hyperglycemia     Random, without any history of diabetes  . 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 Developed after cataract surgery 05/03/09  . Pseudophakia   . 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 Hematuria     Resolved after coumadin was stopped  . Paroxysmal atrial fibrillation (HCC)     On amiodarone but not on anticoagulant  . H/O echocardiogram     Echo 03/01/11 EF = >55%. Shows normal systolic function with mild to moderate LV hypertrophy. Left atrial dimension 3.8 cm & a pulmonary artery pressure of 38. There was breakthrough diastolic dysfunction.  . Syncope 02/22/11    During OV on 02/22/11 her INR was 7.3 and was given 2.5 mg of vitamin K. Later that day she became diaphoretic & fainted.  . Hypertension     Difficult to control. Renal Doppler 06/02/10 showed less that 60% bilateral renal artery narrowing.    Past Surgical History  Procedure Laterality Date  . Total knee arthroplasty Right 08/26/10    By Dr. Elta Guadeloupe C. Yates. Cemented, computer assist  . Cataract extraction  05/03/09  . Remote hysterectomy    . Cholecystectomy    . Breast biopsy    . Hemorrhoid surgery    . Tonsillectomy     Prior to Admission medications   Medication Sig Start Date End Date Taking? Authorizing Provider  acetaminophen (TYLENOL) 500 MG tablet Take 500 mg by mouth daily as needed.   Yes Historical Provider, MD  anastrozole  (ARIMIDEX) 1 MG tablet Take 1 mg by mouth daily.   Yes Historical Provider, MD  apixaban (ELIQUIS) 5 MG TABS tablet Take 5 mg by mouth 2 (two) times daily.   Yes Historical Provider, MD  aspirin 81 MG tablet Take 81 mg by mouth daily.   Yes Historical Provider, MD  hydrochlorothiazide (HYDRODIURIL) 25 MG tablet Take 25 mg by mouth daily.   Yes Historical Provider, MD  temazepam (RESTORIL) 15 MG capsule Take 15 mg by mouth at bedtime as needed for sleep.   Yes Historical Provider, MD  valsartan (DIOVAN) 320 MG tablet Take 1 tablet (320 mg total) by mouth every morning. 08/17/15  Yes Doreene Burke Kilroy, PA-C  bisacodyl (DULCOLAX) 10 MG suppository Place 10 mg rectally as needed for moderate constipation. Reported on 09/29/2015    Historical Provider, MD  clorazepate (TRANXENE) 3.75 MG tablet Take 3.75 mg by mouth 2 (two) times daily as needed for anxiety. Reported on 09/29/2015    Historical Provider, MD  esomeprazole (NEXIUM) 40 MG capsule Take 40 mg by mouth daily before breakfast. Reported on 09/29/2015    Historical Provider, MD  Omega-3 Fatty Acids (FISH OIL) 1000 MG CAPS Take by mouth. Reported on 09/29/2015    Historical Provider, MD  pantoprazole (PROTONIX) 40 MG tablet Take 40 mg by mouth daily. Reported on 09/29/2015 08/02/15   Historical Provider, MD  predniSONE (DELTASONE) 20 MG tablet 60 mg daily. Reported on 09/29/2015 08/02/15   Historical Provider, MD  traMADol (ULTRAM) 50 MG tablet Take 50 mg by mouth every 4 (four) hours as needed for moderate pain. Reported on 09/29/2015 06/04/15   Historical Provider, MD   Allergies  Allergen Reactions  . Amiodarone Shortness Of Breath    Stopped in Feb 2017 secondary to pulmonary complications  . Benicar [Olmesartan]     Unclear of the intolerance  . Bystolic [Nebivolol Hcl]   . Clonidine Derivatives   . Exforge [Amlodipine Besylate-Valsartan] Other (See Comments)    Unclear  . Hctz [Hydrochlorothiazide]     Currently taking without problem; question if  this has to do with hypokalemia  . Hydralazine Other (See Comments)    Also tolerated, but had orthostatic changes on standing medication  . Lisinopril   . Multaq [Dronedarone] Nausea And Vomiting  . Rythmol [Propafenone]   . Statins   . Tekturna [Aliskiren]   . Coreg [Carvedilol] Other (See Comments)    fatigue  . Prednisone     Jittery     Social History   Social History  . Marital Status: Widowed    Spouse Name: N/A  . Number of Children: 1  . Years of Education: N/A   Occupational History  .     Social History Main Topics  . Smoking status: Never Smoker   . Smokeless tobacco: None  . Alcohol Use: No  . Drug Use: No  . Sexual Activity: Not Asked   Other Topics Concern  . None   Social History Narrative   She is a widowed mother of one and grandmother of one.  She does not exercise.  She does not smoke and does not drink alcohol.   family history includes Cancer in her mother; Coronary artery disease in her brother; Diabetes in her brother; Heart attack in her sister; Hypertension in her brother, mother, and sister.   Wt Readings from Last 3 Encounters:  09/29/15 117 lb 3.2 oz (53.162 kg)  08/17/15 136 lb (61.689 kg)  01/27/15 144 lb (65.318 kg)    PHYSICAL EXAM BP 182/88 mmHg  Pulse 96  Ht 5' (1.524 m)  Wt 117 lb 3.2 oz (53.162 kg)  BMI 22.89 kg/m2 General appearance: alert, cooperative, appears stated age, no distress and Well-nourished, well-groomed.  HEENT: [place]/AT, EOMI, MMM, anicteric sclera Neck: no adenopathy, no carotid bruit and no JVD Lungs: Diffuse mild interstitial sounds with late expiratory wheezing. No rales. Normal percussion bilaterally and non-labored Heart: regular rate and rhythm, S1, S2 normal, no murmur, click, rub or gallop; nondisplaced PMI  Abdomen: soft, non-tender; bowel sounds normal; no masses,  no organomegaly;  Extremities: extremities normal, atraumatic, no cyanosis, and trace edema with mild venous stasis changes and spider  veins.  Pulses: 2+ and symmetric;  Skin: no evidence of bleeding or bruising, no lesions noted and Mild stasis changes with bilateral spider veins  Neurologic:  Mental status: Alert, oriented, thought content appropriate Cranial nerves: normal (II-XII grossly intact)    Adult ECG Report Not checked  Other studies Reviewed: Additional studies/ records that were reviewed today include:  Recent Labs:  No labs available     ASSESSMENT / PLAN: Problem List Items Addressed This Visit    PAF (paroxysmal atrial fibrillation) ([place]) - Primary (Chronic)    Maintaining sinus rhythm by exam. No longer on amiodarone. In fact she is not on any type of rate control agent.  We'll need to monitor for any tachycardia. I would probably choose diltiazem for rate control if necessary.  For now I think to avoid any further issues we will continue off of amiodarone. She had previously not been on any correlation, but is now on Eliquis for PE.      Relevant Medications   apixaban (ELIQUIS) 5 MG TABS tablet   hydrochlorothiazide (HYDRODIURIL) 25 MG tablet   Hypertension goal BP (blood pressure) < 150/90 (Chronic)    Her blood pressure is quite labile today. At this point I'm not really sure what medicines she was put back on it seems that she is on Diovan and HCTZ. Blood pressure is elevated today, but usually is better at home. Will simply monitor for now. Not a lot of options. We'll need to consider using calcium channel blocker for rate control and blood pressure.      Relevant Medications   apixaban (ELIQUIS) 5 MG TABS tablet   hydrochlorothiazide (HYDRODIURIL) 25 MG tablet   Amiodarone pulmonary toxicity (HCC)    I don't think that the bronchiolitis obliterans obstructing pneumonia is related to amiodarone, however if we can get away with not being on amiodarone, I'm fine with stopping it.         Current medicines are reviewed at length with the patient today. (+/- concerns) ? Not sure what  medicines she should be taking  The following changes have been made:  No change in medications. Continue to be off amiodarone.  Studies Ordered:   No orders of the defined types were placed in this encounter.      Leonie Man, M.D., M.S. Interventional Cardiologist   Pager # [phone] Phone # [phone] [address] [place] [employer], [place] [address]

## 2015-09-29 NOTE — Patient Instructions (Signed)
Medication Instructions:  Your physician recommends that you continue on your current medications as directed. Please refer to the Current Medication list given to you today.  Labwork: none  Testing/Procedures: none  Follow-Up: Your physician recommends that you schedule a follow-up appointment in: September or October   If you need a refill on your cardiac medications before your next appointment, please call your pharmacy.

## 2015-09-30 DIAGNOSIS — J984 Other disorders of lung: Secondary | ICD-10-CM | POA: Diagnosis not present

## 2015-09-30 DIAGNOSIS — J8489 Other specified interstitial pulmonary diseases: Secondary | ICD-10-CM | POA: Diagnosis not present

## 2015-09-30 DIAGNOSIS — T462X5D Adverse effect of other antidysrhythmic drugs, subsequent encounter: Secondary | ICD-10-CM | POA: Diagnosis not present

## 2015-09-30 DIAGNOSIS — I459 Conduction disorder, unspecified: Secondary | ICD-10-CM | POA: Diagnosis not present

## 2015-09-30 DIAGNOSIS — I480 Paroxysmal atrial fibrillation: Secondary | ICD-10-CM | POA: Diagnosis not present

## 2015-09-30 DIAGNOSIS — R55 Syncope and collapse: Secondary | ICD-10-CM | POA: Diagnosis not present

## 2015-09-30 DIAGNOSIS — Z9181 History of falling: Secondary | ICD-10-CM | POA: Diagnosis not present

## 2015-09-30 DIAGNOSIS — I10 Essential (primary) hypertension: Secondary | ICD-10-CM | POA: Diagnosis not present

## 2015-09-30 DIAGNOSIS — R001 Bradycardia, unspecified: Secondary | ICD-10-CM | POA: Diagnosis not present

## 2015-10-01 ENCOUNTER — Encounter: Payer: Self-pay | Admitting: Cardiology

## 2015-10-01 NOTE — Assessment & Plan Note (Signed)
I don't think that the bronchiolitis obliterans obstructing pneumonia is related to amiodarone, however if we can get away with not being on amiodarone, I'm fine with stopping it.

## 2015-10-01 NOTE — Assessment & Plan Note (Signed)
Her blood pressure is quite labile today. At this point I'm not really sure what medicines she was put back on it seems that she is on Diovan and HCTZ. Blood pressure is elevated today, but usually is better at home. Will simply monitor for now. Not a lot of options. We'll need to consider using calcium channel blocker for rate control and blood pressure.

## 2015-10-01 NOTE — Assessment & Plan Note (Signed)
Maintaining sinus rhythm by exam. No longer on amiodarone. In fact she is not on any type of rate control agent.  We'll need to monitor for any tachycardia. I would probably choose diltiazem for rate control if necessary.  For now I think to avoid any further issues we will continue off of amiodarone. She had previously not been on any correlation, but is now on Eliquis for PE.

## 2015-10-18 ENCOUNTER — Ambulatory Visit: Payer: Commercial Managed Care - HMO | Admitting: Cardiology

## 2015-12-02 DIAGNOSIS — C50919 Malignant neoplasm of unspecified site of unspecified female breast: Secondary | ICD-10-CM | POA: Diagnosis not present

## 2015-12-02 DIAGNOSIS — Z79811 Long term (current) use of aromatase inhibitors: Secondary | ICD-10-CM | POA: Diagnosis not present

## 2015-12-02 DIAGNOSIS — Z853 Personal history of malignant neoplasm of breast: Secondary | ICD-10-CM | POA: Diagnosis not present

## 2015-12-03 DIAGNOSIS — I4891 Unspecified atrial fibrillation: Secondary | ICD-10-CM | POA: Diagnosis not present

## 2015-12-03 DIAGNOSIS — M2041 Other hammer toe(s) (acquired), right foot: Secondary | ICD-10-CM | POA: Diagnosis not present

## 2015-12-03 DIAGNOSIS — G2581 Restless legs syndrome: Secondary | ICD-10-CM | POA: Diagnosis not present

## 2015-12-03 DIAGNOSIS — M2042 Other hammer toe(s) (acquired), left foot: Secondary | ICD-10-CM | POA: Diagnosis not present

## 2015-12-29 ENCOUNTER — Encounter: Payer: Self-pay | Admitting: Sports Medicine

## 2015-12-29 ENCOUNTER — Ambulatory Visit (INDEPENDENT_AMBULATORY_CARE_PROVIDER_SITE_OTHER): Payer: Commercial Managed Care - HMO | Admitting: Sports Medicine

## 2015-12-29 DIAGNOSIS — M79676 Pain in unspecified toe(s): Secondary | ICD-10-CM

## 2015-12-29 DIAGNOSIS — M2040 Other hammer toe(s) (acquired), unspecified foot: Secondary | ICD-10-CM | POA: Diagnosis not present

## 2015-12-29 DIAGNOSIS — B351 Tinea unguium: Secondary | ICD-10-CM

## 2015-12-29 DIAGNOSIS — M21619 Bunion of unspecified foot: Secondary | ICD-10-CM | POA: Diagnosis not present

## 2015-12-29 DIAGNOSIS — M79609 Pain in unspecified limb: Secondary | ICD-10-CM

## 2015-12-29 DIAGNOSIS — L84 Corns and callosities: Secondary | ICD-10-CM | POA: Diagnosis not present

## 2015-12-29 NOTE — Progress Notes (Signed)
Patient ID: Sara [name], female   DOB: 07/07/1925, 82 y.o.   MRN: [id number] Subjective: Sara [name] is a 82 y.o. female patient seen today in office with complaint of hammertoes and painful thickened and elongated toenails and callus; unable to trim. Patient denies history of Diabetes, Neuropathy, or Vascular disease. Admits knee pain R>L. Patient has no other pedal complaints at this time.   Patient Active Problem List   Diagnosis Date Noted  . Amiodarone pulmonary toxicity ([place]) 08/17/2015  . Obesity (BMI 30-39.9) 03/18/2013  . PAF (paroxysmal atrial fibrillation) ([place]) 03/18/2013  . Hypertension goal BP (blood pressure) < 150/90 03/18/2013    Current Outpatient Prescriptions on File Prior to Visit  Medication Sig Dispense Refill  . acetaminophen (TYLENOL) 500 MG tablet Take 500 mg by mouth daily as needed.    Marland Kitchen anastrozole (ARIMIDEX) 1 MG tablet Take 1 mg by mouth daily.    Marland Kitchen apixaban (ELIQUIS) 5 MG TABS tablet Take 5 mg by mouth 2 (two) times daily.    Marland Kitchen aspirin 81 MG tablet Take 81 mg by mouth daily.    . bisacodyl (DULCOLAX) 10 MG suppository Place 10 mg rectally as needed for moderate constipation. Reported on 09/29/2015    . clorazepate (TRANXENE) 3.75 MG tablet Take 3.75 mg by mouth 2 (two) times daily as needed for anxiety. Reported on 09/29/2015    . esomeprazole (NEXIUM) 40 MG capsule Take 40 mg by mouth daily before breakfast. Reported on 09/29/2015    . hydrochlorothiazide (HYDRODIURIL) 25 MG tablet Take 25 mg by mouth daily.    . Omega-3 Fatty Acids (FISH OIL) 1000 MG CAPS Take by mouth. Reported on 09/29/2015    . pantoprazole (PROTONIX) 40 MG tablet Take 40 mg by mouth daily. Reported on 09/29/2015  0  . temazepam (RESTORIL) 15 MG capsule Take 15 mg by mouth at bedtime as needed for sleep.    . traMADol (ULTRAM) 50 MG tablet Take 50 mg by mouth every 4 (four) hours as needed for moderate pain. Reported on 09/29/2015  0  . valsartan (DIOVAN) 320 MG tablet Take 1 tablet  (320 mg total) by mouth every morning. 90 tablet 3   No current facility-administered medications on file prior to visit.    Allergies  Allergen Reactions  . Amiodarone Shortness Of Breath    Stopped in Feb 2017 secondary to pulmonary complications  . Benicar [Olmesartan]     Unclear of the intolerance  . Bystolic [Nebivolol Hcl]   . Clonidine Derivatives   . Exforge [Amlodipine Besylate-Valsartan] Other (See Comments)    Unclear  . Hctz [Hydrochlorothiazide]     Currently taking without problem; question if this has to do with hypokalemia  . Hydralazine Other (See Comments)    Also tolerated, but had orthostatic changes on standing medication  . Lisinopril   . Multaq [Dronedarone] Nausea And Vomiting  . Rythmol [Propafenone]   . Statins   . Tekturna [Aliskiren]   . Coreg [Carvedilol] Other (See Comments)    fatigue  . Prednisone     Jittery     Objective: Physical Exam  General: Well developed, nourished, no acute distress, awake, alert and oriented x 3  Vascular: Dorsalis pedis artery 1/4 bilateral, Posterior tibial artery 1/4 bilateral, skin temperature warm to warm proximal to distal bilateral lower extremities, + varicosities, no pedal hair present bilateral.  Neurological: Gross sensation present via light touch bilateral.   Dermatological: Skin is warm, dry, and supple bilateral, Nails 1-10 are tender,  long, thick, and discolored with mild subungal debris, no webspace macerations present bilateral, no open lesions present bilateral, + callus present sub met 2 bilateral. No signs of infection bilateral.  Musculoskeletal:Asymptomatic bunion and hammertoes with severe dislocation of right 2nd toe from end stage bunion. Muscular strength within normal limits without pain on range of motion. No pain with calf compression bilateral.  Assessment and Plan:  Problem List Items Addressed This Visit    None    Visit Diagnoses    Hammertoe, unspecified laterality    -   Primary    Pain due to onychomycosis of nail        Callus of foot        Bunion           -Examined patient.  -Discussed treatment options for painful mycotic nails, callus, bunion, and hammertoes. -Mechanically debrided callus x 2 using sterile chisel blade and reduced mycotic nails with sterile nail nipper and dremel nail file without incident. -Applied Salinocaine covered with moleskin to callus sites to remove in one day -Gave toe spacers to use as instructed -Advised wide good supportive shoes for foot type, no acute indication for surgery due to Patient age, functional status and health. -Patient to return to office as needed follow up evaluation or sooner if symptoms worsen.  Landis Martins, DPM

## 2016-01-10 DIAGNOSIS — R55 Syncope and collapse: Secondary | ICD-10-CM | POA: Diagnosis not present

## 2016-01-18 ENCOUNTER — Encounter: Payer: Self-pay | Admitting: Nurse Practitioner

## 2016-01-19 ENCOUNTER — Telehealth: Payer: Self-pay | Admitting: Cardiology

## 2016-01-19 ENCOUNTER — Ambulatory Visit: Payer: Commercial Managed Care - HMO | Admitting: Nurse Practitioner

## 2016-01-19 DIAGNOSIS — R0989 Other specified symptoms and signs involving the circulatory and respiratory systems: Secondary | ICD-10-CM

## 2016-01-19 NOTE — Progress Notes (Deleted)
CARDIOLOGY OFFICE NOTE  Date:  01/19/2016    Sara [name] Date of Birth: 1926-05-02 Medical Record [id number]  PCP:  Townsend Roger, MD  Cardiologist:  Ulyses Jarred chief complaint on file.   History of Present Illness: Sara [name] is a 82 y.o. female who presents today for a work in visit. Seen for Dr. Ellyn Hack.   She has a history of atrial fibrillation. 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 She has not been on oral anticoagulation due to her age. Other issues include HTN and HLD. She has had prior issues with syncope - no cause able to be identified.   Last seen back in April - appeared to be doing ok.  Now referred back here from PCP due to recurrent syncopal spells. She continues to have these episodes where her eyes roll back in her head - gets tense - arms/legs draw up - has lost control of her bowels as well. Family has witnessed these episodes.   Comes back today. Here with        Comes in today. Here with   Past Medical History:  Diagnosis Date  . Ankle edema    Mild, which is intermittent, usually mildly dependent, left slightly greater than the right.  . Anxiety   . Arthritis    In her knees  . Balance problem    Due to weakened knee  . Corneal edema    Severe corneal edema in right eye with multiple folds. Developed after cataract surgery 05/03/09  . GERD (gastroesophageal reflux disease)   . H/O echocardiogram    Echo 03/01/11 EF = >55%. Shows normal systolic function with mild to moderate LV hypertrophy. Left atrial dimension 3.8 cm & a pulmonary artery pressure of 38. There was breakthrough diastolic dysfunction.  . Hematuria    Resolved after coumadin was stopped  . Hiatal hernia    With GERD  . Hyperglycemia    Random, without any history of diabetes  . Hyperlipidemia   . Hypertension    Difficult to control. Renal Doppler 06/02/10 showed less  that 60% bilateral renal artery narrowing.  . Hypokalemia   . Paroxysmal atrial fibrillation (HCC)    On amiodarone but not on anticoagulant  . Posthemorrhagic anemia   . Pseudophakia   . Syncope 02/22/11   During OV on 02/22/11 her INR was 7.3 and was given 2.5 mg of vitamin K. Later that day she became diaphoretic & fainted.  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 BREAST BIOPSY    . CATARACT EXTRACTION  05/03/09  . CHOLECYSTECTOMY    . HEMORRHOID SURGERY    . Remote hysterectomy    . TONSILLECTOMY    . TOTAL KNEE ARTHROPLASTY Right 08/26/10   By Dr. Elta Guadeloupe C. Yates. Cemented, computer assist     Medications: Current Outpatient Prescriptions  Medication Sig Dispense Refill  . acetaminophen (TYLENOL) 500 MG tablet Take 500 mg by mouth daily as needed.    Marland Kitchen anastrozole (ARIMIDEX) 1 MG tablet Take 1 mg by mouth daily.    Marland Kitchen apixaban (ELIQUIS) 5 MG TABS tablet Take 5 mg by mouth 2 (two) times daily.    Marland Kitchen aspirin 81 MG tablet Take 81 mg by mouth daily.    . bisacodyl (DULCOLAX) 10 MG suppository Place 10 mg rectally as  needed for moderate constipation. Reported on 09/29/2015    . clorazepate (TRANXENE) 3.75 MG tablet Take 3.75 mg by mouth 2 (two) times daily as needed for anxiety. Reported on 09/29/2015    . esomeprazole (NEXIUM) 40 MG capsule Take 40 mg by mouth daily before breakfast. Reported on 09/29/2015    . hydrochlorothiazide (HYDRODIURIL) 25 MG tablet Take 25 mg by mouth daily.    . Omega-3 Fatty Acids (FISH OIL) 1000 MG CAPS Take by mouth. Reported on 09/29/2015    . pantoprazole (PROTONIX) 40 MG tablet Take 40 mg by mouth daily. Reported on 09/29/2015  0  . temazepam (RESTORIL) 15 MG capsule Take 15 mg by mouth at bedtime as needed for sleep.    . traMADol (ULTRAM) 50 MG tablet Take 50 mg by mouth every 4 (four) hours as needed for moderate pain. Reported on 09/29/2015  0  . valsartan (DIOVAN) 320 MG tablet Take 1 tablet  (320 mg total) by mouth every morning. 90 tablet 3   No current facility-administered medications for this visit.     Allergies: Allergies  Allergen Reactions  . Amiodarone Shortness Of Breath    Stopped in Feb 2017 secondary to pulmonary complications  . Benicar [Olmesartan]     Unclear of the intolerance  . Bystolic [Nebivolol Hcl]   . Clonidine Derivatives   . Exforge [Amlodipine Besylate-Valsartan] Other (See Comments)    Unclear  . Hctz [Hydrochlorothiazide]     Currently taking without problem; question if this has to do with hypokalemia  . Hydralazine Other (See Comments)    Also tolerated, but had orthostatic changes on standing medication  . Lisinopril   . Multaq [Dronedarone] Nausea And Vomiting  . Rythmol [Propafenone]   . Statins   . Tekturna [Aliskiren]   . Coreg [Carvedilol] Other (See Comments)    fatigue  . Prednisone     Jittery     Social History: The patient  reports that she has never smoked. She has never used smokeless tobacco. She reports that she does not drink alcohol or use drugs.   Family History: The patient's family history includes Cancer in her mother; Coronary artery disease in her brother; Diabetes in her brother; Heart attack in her sister; Hypertension in her brother, mother, and sister.   Review of Systems: Please see the history of present illness.   Otherwise, the review of systems is positive for none.   All other systems are reviewed and negative.   Physical Exam: VS:  There were no vitals taken for this visit. Marland Kitchen  BMI There is no height or weight on file to calculate BMI.  Wt Readings from Last 3 Encounters:  09/29/15 117 lb 3.2 oz (53.2 kg)  08/17/15 136 lb (61.7 kg)  01/27/15 144 lb (65.3 kg)    General: Pleasant. Well developed, well nourished and in no acute distress.   HEENT: Normal.  Neck: Supple, no JVD, carotid bruits, or masses noted.  Cardiac: Regular rate and rhythm. No murmurs, rubs, or gallops. No edema.    Respiratory:  Lungs are clear to auscultation bilaterally with normal work of breathing.  GI: Soft and nontender.  MS: No deformity or atrophy. Gait and ROM intact.  Skin: Warm and dry. Color is normal.  Neuro:  Strength and sensation are intact and no gross focal deficits noted.  Psych: Alert, appropriate and with normal affect.   LABORATORY DATA:  EKG:  EKG is not ordered today.  Lab Results  Component Value Date  WBC 6.1 08/29/2010   HGB 8.8 (L) 08/29/2010   HCT 26.8 (L) 08/29/2010   PLT 209 08/29/2010   GLUCOSE 108 (H) 08/29/2010   ALT 18 08/23/2010   AST 23 08/23/2010   NA 132 (L) 08/29/2010   K 3.4 (L) 08/29/2010   CL 99 08/29/2010   CREATININE 0.87 08/29/2010   BUN 15 08/29/2010   CO2 27 08/29/2010   INR 1.73 (H) 08/30/2010   HGBA1C  05/03/2009    5.6 (NOTE) The ADA recommends the following therapeutic goal for glycemic control related to Hgb A1c measurement: Goal of therapy: <6.5 Hgb A1c  Reference: American Diabetes Association: Clinical Practice Recommendations 2010, Diabetes Care, 2010, 33: (Suppl  1).    BNP (last 3 results) No results for input(s): BNP in the last 8760 hours.  ProBNP (last 3 results) No results for input(s): PROBNP in the last 8760 hours.   Other Studies Reviewed Today:   Assessment/Plan: 1. Syncope  2. HTN  3. PAF  4. Advanced age  Current medicines are reviewed with the patient today.  The patient does not have concerns regarding medicines other than what has been noted above.  The following changes have been made:  See above.  Labs/ tests ordered today include:   No orders of the defined types were placed in this encounter.    Disposition:   FU with Dr. Ellyn Hack as planned.    Patient is agreeable to this plan and will call if any problems develop in the interim.   Signed: Burtis Junes, RN, ANP-C 01/19/2016 9:03 AM  [facility] [address] [place], [place]   [address] Phone: [phone] Fax: [phone]

## 2016-01-19 NOTE — Telephone Encounter (Signed)
Received records from [facility] for appointment on 02/29/16 with Dr Ellyn Hack.  Records given to [facility] (medical records) for Dr Allison Quarry schedule on 02/29/16. lp

## 2016-02-18 DIAGNOSIS — J8489 Other specified interstitial pulmonary diseases: Secondary | ICD-10-CM | POA: Diagnosis not present

## 2016-02-18 DIAGNOSIS — I4891 Unspecified atrial fibrillation: Secondary | ICD-10-CM | POA: Diagnosis not present

## 2016-02-18 DIAGNOSIS — Z1389 Encounter for screening for other disorder: Secondary | ICD-10-CM | POA: Diagnosis not present

## 2016-02-18 DIAGNOSIS — E559 Vitamin D deficiency, unspecified: Secondary | ICD-10-CM | POA: Diagnosis not present

## 2016-02-18 DIAGNOSIS — Z0000 Encounter for general adult medical examination without abnormal findings: Secondary | ICD-10-CM | POA: Diagnosis not present

## 2016-02-18 DIAGNOSIS — G2581 Restless legs syndrome: Secondary | ICD-10-CM | POA: Diagnosis not present

## 2016-02-18 DIAGNOSIS — I10 Essential (primary) hypertension: Secondary | ICD-10-CM | POA: Diagnosis not present

## 2016-02-18 DIAGNOSIS — K219 Gastro-esophageal reflux disease without esophagitis: Secondary | ICD-10-CM | POA: Diagnosis not present

## 2016-02-18 DIAGNOSIS — E7800 Pure hypercholesterolemia, unspecified: Secondary | ICD-10-CM | POA: Diagnosis not present

## 2016-02-29 ENCOUNTER — Ambulatory Visit: Payer: Commercial Managed Care - HMO | Admitting: Cardiology

## 2016-03-16 DIAGNOSIS — C50212 Malignant neoplasm of upper-inner quadrant of left female breast: Secondary | ICD-10-CM | POA: Diagnosis not present

## 2016-03-16 DIAGNOSIS — R928 Other abnormal and inconclusive findings on diagnostic imaging of breast: Secondary | ICD-10-CM | POA: Diagnosis not present

## 2016-03-19 HISTORY — PX: TRANSTHORACIC ECHOCARDIOGRAM: SHX275

## 2016-03-24 ENCOUNTER — Ambulatory Visit (INDEPENDENT_AMBULATORY_CARE_PROVIDER_SITE_OTHER): Payer: Commercial Managed Care - HMO | Admitting: Cardiology

## 2016-03-24 ENCOUNTER — Encounter: Payer: Self-pay | Admitting: Cardiology

## 2016-03-24 DIAGNOSIS — I10 Essential (primary) hypertension: Secondary | ICD-10-CM | POA: Diagnosis not present

## 2016-03-24 DIAGNOSIS — J984 Other disorders of lung: Secondary | ICD-10-CM

## 2016-03-24 DIAGNOSIS — R55 Syncope and collapse: Secondary | ICD-10-CM | POA: Diagnosis not present

## 2016-03-24 DIAGNOSIS — I480 Paroxysmal atrial fibrillation: Secondary | ICD-10-CM

## 2016-03-24 DIAGNOSIS — T462X5A Adverse effect of other antidysrhythmic drugs, initial encounter: Secondary | ICD-10-CM

## 2016-03-24 NOTE — Patient Instructions (Signed)
If your episodes reoccur ,please go to hospital for evaluation   No changes at present time   Your physician wants you to follow-up in: 6 months with DR HARDING- 30 MINYou will receive a reminder letter in the mail two months in advance. If you don't receive a letter, please call our office to schedule the follow-up appointment.   If you need a refill on your cardiac medications before your next appointment, please call your pharmacy.

## 2016-03-24 NOTE — Progress Notes (Signed)
PCP: Townsend Roger, MD  Clinic Note: Chief Complaint  Patient presents with  . Follow-up    pt states no chest pain no SOB no light headedness or dizziness, no edema   . Loss of Consciousness    A. fib    HPI: Sara [name] is a 82 y.o. female with a PMH below who presents today for Delayed annual follow-up for atrial fibrillation.. She is a former long-standing patient of Dr. 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  There was concern that there is possible amiodarone toxicity. She has not been on oral correlation due to her age. She also has long-standing difficult to control hypertension.  I actually last saw her in December 2015. She was doing relatively well at that time. Really oblivious to 20 A. fib sensations. Otherwise stable. No heart failure symptoms. She saw Ellen Henri back in August 2016, and she started hydralazine. This apparently is also been stopped.  Sara [name] was last seen in Feb 2017 by Mr. Rosalyn Gess, [place].  Recent Hospitalizations: She has had several hospitalizations at [facility] --At least 2 or 3 syncopal episodes with no clear etiology found.  Studies Reviewed: Unfortunately none of the studies that were done at [facility] are available. -- Was apparently evaluated by a cardiologist at [facility], and the initial decision was that she would not need a pacemaker following the more significant cardiac arrest event that occurred in the hospital. Unfortunately all the patient notices someone told her "heart stopped"  Interval History: Very difficult, poor historian   She presents today indicating that she really hasn't had any problems since her last visit, but then at the end the conversation it appeared that maybe she has had another one of her episodes since last visit. Or sling  her son is with her here today cannot corroborate whether this was more recent or the older episodes. It seems like these episodes were potentially postprandial.  Really it took a long time trying to piece together what symptoms are currently active. She doesn't seem to be having any chest tightness or pressure with rest or exertion. No real heart failure symptoms of the dyspnea on exertion or PND/orthopnea. She does have some mild edema intermittently. Has not really noted any sensation of rapid irregular heartbeats either. Artery until maybe she's had one potential episode but it from her history is very very hard to understand the timing. No melena, hematochezia, hematuria, or epstaxis -with the addition of ELIQUIS. No recent falls.  No claudication. Her PCP reports in the July note, that she apparently had 2 episodes in the week prior to the clinic visit. Both were associated with eyes rolling back and the back of her head, being stiff and unresponsive. There were no tonic clonic movements noted however. Symptoms usually lasted some 5-10 minutes. She often loses control of her bowel and bladder. Appears that while in the hospital she had a hypotensive/bradycardic event while the Dionne Bucy medications in the cardiology consultation indicated no need for pacemaker, but wanted recent hypertension. - Sounds like vasovagal events, but this is not what sounded like when she was riding in a car prior to. She had orthostatics checked by her PCP and her systolic dropped by 40 points. Her diuretic was stopped and she recommend hydrate. While in the hospital and [facility],  her ELIQUIS was stopped she had suggestion of sick sinus syndrome and her amiodarone was held along with Coreg. There was suggestion of possible amiodarone related pulmonary disease - BOOP?  Between July and September, there were no further events.   ROS: A comprehensive was performed. Review of Systems  Constitutional: Negative for  diaphoresis and malaise/fatigue.  HENT: Negative for congestion and nosebleeds.   Respiratory: Positive for cough and shortness of breath.   Cardiovascular: Negative for claudication.       Per history of present illness  Gastrointestinal: Negative for constipation and diarrhea.  Musculoskeletal: Positive for back pain and joint pain. Negative for falls.  Neurological: Positive for tingling (Numbness in her hands and feet) and loss of consciousness. Negative for dizziness and headaches.       Restless leg type symptoms at night  Endo/Heme/Allergies: Bruises/bleeds easily.    Past Medical History:  Diagnosis Date  . Ankle edema    Mild, which is intermittent, usually mildly dependent, left slightly greater than the right.  . Anxiety   . Arthritis    In her knees  . Balance problem    Due to weakened knee  . Corneal edema    Severe corneal edema in right eye with multiple folds. Developed after cataract surgery 05/03/09  . GERD (gastroesophageal reflux disease)   . H/O echocardiogram    Echo 03/01/11 EF = >55%. Shows normal systolic function with mild to moderate LV hypertrophy. Left atrial dimension 3.8 cm & a pulmonary artery pressure of 38. There was breakthrough diastolic dysfunction.  . Hematuria    Resolved after coumadin was stopped  . Hiatal hernia    With GERD  . Hyperglycemia    Random, without any history of diabetes  . Hyperlipidemia   . Hypertension    Difficult to control. Renal Doppler 06/02/10 showed less that 60% bilateral renal artery narrowing.  . Hypokalemia   . Paroxysmal atrial fibrillation (HCC)    On amiodarone but not on anticoagulant  . Posthemorrhagic anemia   . Pseudophakia   . Syncope 02/22/11   During OV on 02/22/11 her INR was 7.3 and was given 2.5 mg of vitamin K. Later that day she became diaphoretic & fainted.  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 BREAST BIOPSY    . CATARACT  EXTRACTION  05/03/09  . CHOLECYSTECTOMY    . HEMORRHOID SURGERY    . Remote hysterectomy    . TONSILLECTOMY    . TOTAL KNEE ARTHROPLASTY Right 08/26/10   By Dr. Elta Guadeloupe C. Yates. Cemented, computer assist     Prior to Admission medications   Medication Sig Start Date End Date Taking? Authorizing Provider  acetaminophen (TYLENOL) 500 MG tablet Take 500 mg by mouth daily as needed.   Yes Historical Provider, MD  anastrozole (ARIMIDEX) 1 MG tablet Take 1 mg by mouth daily.   Yes Historical Provider, MD  apixaban (ELIQUIS) 5 MG TABS tablet Take 5 mg by mouth 2 (two) times daily.   Yes Historical Provider, MD  aspirin 81 MG tablet Take 81 mg by mouth daily.   Yes Historical Provider, MD  hydrochlorothiazide (HYDRODIURIL) 25 MG tablet Take 25 mg by mouth daily.   Yes Historical Provider, MD  temazepam (RESTORIL) 15 MG capsule Take 15 mg by mouth at bedtime as needed for sleep.   Yes Historical Provider, MD  valsartan (DIOVAN) 320 MG tablet Take 1 tablet (320  mg total) by mouth every morning. 08/17/15  Yes Doreene Burke Kilroy, PA-C  bisacodyl (DULCOLAX) 10 MG suppository Place 10 mg rectally as needed for moderate constipation. Reported on 09/29/2015    Historical Provider, MD  clorazepate (TRANXENE) 3.75 MG tablet Take 3.75 mg by mouth 2 (two) times daily as needed for anxiety. Reported on 09/29/2015    Historical Provider, MD  esomeprazole (NEXIUM) 40 MG capsule Take 40 mg by mouth daily before breakfast. Reported on 09/29/2015    Historical Provider, MD  Omega-3 Fatty Acids (FISH OIL) 1000 MG CAPS Take by mouth. Reported on 09/29/2015    Historical Provider, MD  pantoprazole (PROTONIX) 40 MG tablet Take 40 mg by mouth daily. Reported on 09/29/2015 08/02/15   Historical Provider, MD  predniSONE (DELTASONE) 20 MG tablet 60 mg daily. Reported on 09/29/2015 08/02/15   Historical Provider, MD  traMADol (ULTRAM) 50 MG tablet Take 50 mg by mouth every 4 (four) hours as needed for moderate pain. Reported on 09/29/2015  06/04/15   Historical Provider, MD    Allergies  Allergen Reactions  . Amiodarone Shortness Of Breath    Stopped in Feb 2017 secondary to pulmonary complications  . Benicar [Olmesartan]     Unclear of the intolerance  . Bystolic [Nebivolol Hcl]   . Clonidine Derivatives   . Exforge [Amlodipine Besylate-Valsartan] Other (See Comments)    Unclear  . Hctz [Hydrochlorothiazide]     Currently taking without problem; question if this has to do with hypokalemia  . Hydralazine Other (See Comments)    Also tolerated, but had orthostatic changes on standing medication  . Lisinopril   . Multaq [Dronedarone] Nausea And Vomiting  . Rythmol [Propafenone]   . Statins   . Tekturna [Aliskiren]   . Coreg [Carvedilol] Other (See Comments)    fatigue  . Prednisone     Jittery     Social History   Social History  . Marital status: Widowed    Spouse name: N/A  . Number of children: 1  . Years of education: N/A   Occupational History  .  Retired    Social History Main Topics  . Smoking status: Never Smoker  . Smokeless tobacco: Never Used  . Alcohol use No  . Drug use: No  . Sexual activity: Not Asked   Other Topics Concern  . None   Social History Narrative   She is a widowed mother of one and grandmother of one.  She does not exercise.  She does not smoke and does not drink alcohol.   family history includes Cancer in her mother; Coronary artery disease in her brother; Diabetes in her brother; Heart attack in her sister; Hypertension in her brother, mother, and sister.   Wt Readings from Last 3 Encounters:  03/24/16 62.7 kg (138 lb 3.2 oz)  09/29/15 53.2 kg (117 lb 3.2 oz)  08/17/15 61.7 kg (136 lb)    PHYSICAL EXAM BP (!) 176/81   Pulse 74   Ht 5' (1.524 m)   Wt 62.7 kg (138 lb 3.2 oz)   SpO2 98%   BMI 26.99 kg/m  General appearance: alert, cooperative, appears stated age, no distress and Well-nourished, well-groomed.  HEENT: [place]/AT, EOMI, MMM, anicteric sclera Neck:  no adenopathy, no carotid bruit and no JVD Lungs: Diffuse mild interstitial sounds with late expiratory wheezing. No rales. Normal percussion bilaterally and non-labored Heart: regular rate and rhythm, S1, S2 normal, no murmur, click, rub or gallop; nondisplaced PMI  Abdomen: soft, non-tender; bowel  sounds normal; no masses,  no organomegaly;  Extremities: extremities normal, atraumatic, no cyanosis, and trace edema with mild venous stasis changes and spider veins.  Pulses: 2+ and symmetric;  Skin: no evidence of bleeding or bruising, no lesions noted and Mild stasis changes with bilateral spider veins  Neurologic: Mental status: Alert, oriented, thought content appropriate Cranial nerves: normal (II-XII grossly intact)    Adult ECG Report Not checked  Other studies Reviewed: Additional studies/ records that were reviewed today include:  Recent Labs:  No labs available   ASSESSMENT / PLAN: Problem List Items Addressed This Visit    PAF (paroxysmal atrial fibrillation) ([place]) (Chronic)    Seems to be in sinus rhythm currently. Question about possible sick sinus syndrome with tachybradycardia syndrome. Not currently on any rate controlling agents as her amiodarone was stopped.  Because of her falling spells and syncope, she was felt to be a poor anticoagulation candidate. Therefore no longer on DOAC or warfarin.      Hypertension goal BP (blood pressure) < 180/100 (Chronic)    Blood pressure remains labile. Her HCTZ is now being taken every other day. Her edema is pretty stable. She still on a high-dose ARB. No longer on beta blocker for concerns of sinus bradycardia/sick sinus disease. At this point, in order to avoid any further single episodes from possible orthostatic symptoms I agree with permissive hypertension allow her per her pressure to be where it is currently.  At this point I would not add any further medications.      Cardiac related syncope -although etiology not clear  (Chronic)    Some of the evaluation at [facility] pointed toward orthostatic hypotension. This doesn't explain her passing out in the front seat a car. Recommended adequate hydration, permissive hypertension Not sure there is a 6 sinus syndrome with synthetic bradycardia component. Question maybe she is having pauses. Without having to telemetry from the hospital it is difficult to say, but the determination was that she didn't incur currently need pacemaker. If she has another episode, I would place her on at least a 30 day event monitor.      Amiodarone pulmonary toxicity    Not a usual diagnosis the goes along with pulmonary lung toxicity, but nonetheless with having some lung toxicity and now no recurrence of A. fib that I can tell, perhaps being off amiodarone is the best option.       Other Visit Diagnoses   None.     Current medicines are reviewed at length with the patient today. (+/- concerns) ? Not sure what medicines she should be taking  The following changes have been made: No changes.  ROV 6 months -- if Sx recur, go to ER.  Will need to consider Loop Recorder vs. Event monitor to follow HR.  Studies Ordered:   No orders of the defined types were placed in this encounter.     Glenetta Hew, M.D., M.S. Interventional Cardiologist   Pager # [phone] Phone # [phone] [address] [place], [place] [address]

## 2016-03-26 ENCOUNTER — Encounter: Payer: Self-pay | Admitting: Cardiology

## 2016-03-26 DIAGNOSIS — R55 Syncope and collapse: Secondary | ICD-10-CM | POA: Insufficient documentation

## 2016-03-26 NOTE — Assessment & Plan Note (Signed)
Blood pressure remains labile. Her HCTZ is now being taken every other day. Her edema is pretty stable. She still on a high-dose ARB. No longer on beta blocker for concerns of sinus bradycardia/sick sinus disease. At this point, in order to avoid any further single episodes from possible orthostatic symptoms I agree with permissive hypertension allow her per her pressure to be where it is currently.  At this point I would not add any further medications.

## 2016-03-26 NOTE — Assessment & Plan Note (Signed)
Not a usual diagnosis the goes along with pulmonary lung toxicity, but nonetheless with having some lung toxicity and now no recurrence of A. fib that I can tell, perhaps being off amiodarone is the best option.

## 2016-03-26 NOTE — Assessment & Plan Note (Signed)
Some of the evaluation at [facility] pointed toward orthostatic hypotension. This doesn't explain her passing out in the front seat a car. Recommended adequate hydration, permissive hypertension Not sure there is a 6 sinus syndrome with synthetic bradycardia component. Question maybe she is having pauses. Without having to telemetry from the hospital it is difficult to say, but the determination was that she didn't incur currently need pacemaker. If she has another episode, I would place her on at least a 30 day event monitor.

## 2016-03-26 NOTE — Assessment & Plan Note (Signed)
Seems to be in sinus rhythm currently. Question about possible sick sinus syndrome with tachybradycardia syndrome. Not currently on any rate controlling agents as her amiodarone was stopped.  Because of her falling spells and syncope, she was felt to be a poor anticoagulation candidate. Therefore no longer on DOAC or warfarin.

## 2016-03-29 ENCOUNTER — Inpatient Hospital Stay (HOSPITAL_COMMUNITY)
Admission: EM | Admit: 2016-03-29 | Discharge: 2016-04-01 | DRG: 309 | Disposition: A | Discharge status: Home / Self Care | Payer: Commercial Managed Care - HMO | Attending: Cardiology | Admitting: Cardiology

## 2016-03-29 ENCOUNTER — Encounter (HOSPITAL_COMMUNITY): Payer: Self-pay | Admitting: *Deleted

## 2016-03-29 ENCOUNTER — Emergency Department (HOSPITAL_COMMUNITY): Payer: Commercial Managed Care - HMO

## 2016-03-29 DIAGNOSIS — Z833 Family history of diabetes mellitus: Secondary | ICD-10-CM | POA: Diagnosis not present

## 2016-03-29 DIAGNOSIS — R000 Tachycardia, unspecified: Secondary | ICD-10-CM | POA: Diagnosis not present

## 2016-03-29 DIAGNOSIS — I2489 Other forms of acute ischemic heart disease: Secondary | ICD-10-CM | POA: Diagnosis present

## 2016-03-29 DIAGNOSIS — F419 Anxiety disorder, unspecified: Secondary | ICD-10-CM | POA: Diagnosis present

## 2016-03-29 DIAGNOSIS — Z7982 Long term (current) use of aspirin: Secondary | ICD-10-CM

## 2016-03-29 DIAGNOSIS — K219 Gastro-esophageal reflux disease without esophagitis: Secondary | ICD-10-CM | POA: Diagnosis present

## 2016-03-29 DIAGNOSIS — R55 Syncope and collapse: Secondary | ICD-10-CM | POA: Diagnosis not present

## 2016-03-29 DIAGNOSIS — I4510 Unspecified right bundle-branch block: Secondary | ICD-10-CM | POA: Diagnosis present

## 2016-03-29 DIAGNOSIS — I480 Paroxysmal atrial fibrillation: Secondary | ICD-10-CM | POA: Diagnosis present

## 2016-03-29 DIAGNOSIS — K449 Diaphragmatic hernia without obstruction or gangrene: Secondary | ICD-10-CM | POA: Diagnosis present

## 2016-03-29 DIAGNOSIS — T462X5A Adverse effect of other antidysrhythmic drugs, initial encounter: Secondary | ICD-10-CM

## 2016-03-29 DIAGNOSIS — I248 Other forms of acute ischemic heart disease: Secondary | ICD-10-CM | POA: Diagnosis present

## 2016-03-29 DIAGNOSIS — I10 Essential (primary) hypertension: Secondary | ICD-10-CM | POA: Diagnosis not present

## 2016-03-29 DIAGNOSIS — R739 Hyperglycemia, unspecified: Secondary | ICD-10-CM | POA: Diagnosis present

## 2016-03-29 DIAGNOSIS — Z96651 Presence of right artificial knee joint: Secondary | ICD-10-CM | POA: Diagnosis present

## 2016-03-29 DIAGNOSIS — I4821 Permanent atrial fibrillation: Secondary | ICD-10-CM | POA: Diagnosis present

## 2016-03-29 DIAGNOSIS — E785 Hyperlipidemia, unspecified: Secondary | ICD-10-CM | POA: Diagnosis present

## 2016-03-29 DIAGNOSIS — J984 Other disorders of lung: Secondary | ICD-10-CM

## 2016-03-29 DIAGNOSIS — I4819 Other persistent atrial fibrillation: Secondary | ICD-10-CM | POA: Diagnosis present

## 2016-03-29 DIAGNOSIS — D649 Anemia, unspecified: Secondary | ICD-10-CM | POA: Diagnosis not present

## 2016-03-29 DIAGNOSIS — Z8249 Family history of ischemic heart disease and other diseases of the circulatory system: Secondary | ICD-10-CM | POA: Diagnosis not present

## 2016-03-29 DIAGNOSIS — R002 Palpitations: Secondary | ICD-10-CM | POA: Diagnosis present

## 2016-03-29 DIAGNOSIS — I4891 Unspecified atrial fibrillation: Secondary | ICD-10-CM

## 2016-03-29 DIAGNOSIS — Z800 Family history of malignant neoplasm of digestive organs: Secondary | ICD-10-CM

## 2016-03-29 DIAGNOSIS — I119 Hypertensive heart disease without heart failure: Secondary | ICD-10-CM | POA: Diagnosis present

## 2016-03-29 DIAGNOSIS — I340 Nonrheumatic mitral (valve) insufficiency: Secondary | ICD-10-CM | POA: Diagnosis not present

## 2016-03-29 LAB — BASIC METABOLIC PANEL
ANION GAP: 8 (ref 5–15)
BUN: 18 mg/dL (ref 6–20)
CALCIUM: 9.7 mg/dL (ref 8.9–10.3)
CHLORIDE: 110 mmol/L (ref 101–111)
CO2: 22 mmol/L (ref 22–32)
CREATININE: 0.71 mg/dL (ref 0.44–1.00)
GFR calc non Af Amer: >60 mL/min (ref >60)
Glucose, Bld: 98 mg/dL (ref 65–99)
Potassium: 3.8 mmol/L (ref 3.5–5.1)
SODIUM: 140 mmol/L (ref 135–145)

## 2016-03-29 LAB — CBC
HCT: 39.3 % (ref 36.0–46.0)
Hemoglobin: 13 g/dL (ref 12.0–15.0)
MCH: 25.9 pg — ABNORMAL LOW (ref 26.0–34.0)
MCHC: 33.1 g/dL (ref 30.0–36.0)
MCV: 78.3 fL (ref 78.0–100.0)
PLATELETS: 282 10*3/uL (ref 150–400)
RBC: 5.02 MIL/uL (ref 3.87–5.11)
RDW: 16.1 % — ABNORMAL HIGH (ref 11.5–15.5)
WBC: 6.1 10*3/uL (ref 4.0–10.5)

## 2016-03-29 LAB — TROPONIN I
TROPONIN I: 0.28 ng/mL — AB (ref <0.03)
Troponin I: 0.26 ng/mL (ref <0.03)

## 2016-03-29 LAB — BRAIN NATRIURETIC PEPTIDE: B Natriuretic Peptide: 699.6 pg/mL — ABNORMAL HIGH (ref 0.0–100.0)

## 2016-03-29 MED ORDER — DILTIAZEM HCL-DEXTROSE 100-5 MG/100ML-% IV SOLN (PREMIX)
5.0000 mg/h | INTRAVENOUS | Status: DC
  Administered 2016-03-29: 5 mg/h via INTRAVENOUS
  Administered 2016-03-30 – 2016-03-31 (×4): 10 mg/h via INTRAVENOUS
  Filled 2016-03-29 (×5): qty 100

## 2016-03-29 MED ORDER — HEPARIN BOLUS VIA INFUSION
3000.0000 [IU] | Freq: Once | INTRAVENOUS | Status: AC
  Administered 2016-03-29: 3000 [IU] via INTRAVENOUS
  Filled 2016-03-29: qty 3000

## 2016-03-29 MED ORDER — ACETAMINOPHEN 500 MG PO TABS: 500.0000 mg | ORAL_TABLET | Freq: Every day | ORAL | Status: DC | PRN

## 2016-03-29 MED ORDER — ALPRAZOLAM 0.25 MG PO TABS
0.2500 mg | ORAL_TABLET | Freq: Two times a day (BID) | ORAL | Status: DC | PRN
  Administered 2016-03-29: 0.25 mg via ORAL
  Filled 2016-03-29: qty 1

## 2016-03-29 MED ORDER — ASPIRIN EC 81 MG PO TBEC
81.0000 mg | DELAYED_RELEASE_TABLET | Freq: Every day | ORAL | Status: DC
  Administered 2016-03-30 – 2016-03-31 (×2): 81 mg via ORAL
  Filled 2016-03-29 (×2): qty 1

## 2016-03-29 MED ORDER — DILTIAZEM LOAD VIA INFUSION
20.0000 mg | Freq: Once | INTRAVENOUS | Status: AC
  Administered 2016-03-29: 20 mg via INTRAVENOUS
  Filled 2016-03-29: qty 20

## 2016-03-29 MED ORDER — ROPINIROLE HCL 1 MG PO TABS
1.0000 mg | ORAL_TABLET | Freq: Every day | ORAL | Status: DC
  Administered 2016-03-30 – 2016-04-01 (×2): 1 mg via ORAL
  Filled 2016-03-29 (×3): qty 1

## 2016-03-29 MED ORDER — ONDANSETRON HCL 4 MG/2ML IJ SOLN
4.0000 mg | Freq: Four times a day (QID) | INTRAMUSCULAR | Status: DC | PRN
Start: 2016-03-29 — End: 2016-04-01

## 2016-03-29 MED ORDER — VITAMIN D 1000 UNITS PO TABS
1000.0000 [IU] | ORAL_TABLET | Freq: Every day | ORAL | Status: DC
  Administered 2016-03-30 – 2016-04-01 (×3): 1000 [IU] via ORAL
  Filled 2016-03-29 (×3): qty 1

## 2016-03-29 MED ORDER — HEPARIN (PORCINE) IN NACL 100-0.45 UNIT/ML-% IJ SOLN
800.0000 [IU]/h | INTRAMUSCULAR | Status: DC
  Administered 2016-03-29: 800 [IU]/h via INTRAVENOUS
  Filled 2016-03-29: qty 250

## 2016-03-29 MED ORDER — ANASTROZOLE 1 MG PO TABS
1.0000 mg | ORAL_TABLET | Freq: Every day | ORAL | Status: DC
  Administered 2016-03-30 – 2016-03-31 (×2): 1 mg via ORAL
  Filled 2016-03-29 (×3): qty 1

## 2016-03-29 NOTE — ED Notes (Signed)
All articles of clothing removed and son Patrick [place]) taking belongings home

## 2016-03-29 NOTE — ED Notes (Signed)
Critical troponin of 0.28 received by Chrislyn RN

## 2016-03-29 NOTE — ED Notes (Signed)
Nurse starting IV will get labs

## 2016-03-29 NOTE — H&P (Signed)
Primary cardiologist: Dr. Glenetta Hew  Reason for admission: Recurrent atrial fibrillation  Clinical Summary [name] is an 82 y.o.female with past medical history outlined below, recently seen by Dr. Ellyn Hack in the office on October 6. She has a history of paroxysmal atrial fibrillation, previously on amiodarone however this was discontinued with concerns about possible toxicity. She has not been anticoagulated recently, chart indicating falling spells and syncope, felt to be overall poor candidate.  She presents describing a feeling of palpitations and shortness of breath since Monday. She has had no chest pain. Reportedly saw PCP earlier today and was told to come to ER for further assessment based on elevated heart rate. Heart rate in the 150s to 160s at presentation with ECG showing rapid atrial fibrillation with right bundle branch block, rule out old lateral infarct pattern, nonspecific ST changes. He has been placed on intravenous diltiazem by ER staff with heart rate coming down to the 120s so far.  Labwork shows abnormal troponin I of 0.28 and BNP is elevated at 699. Chest x-ray shows chronic interstitial changes without infiltrates or obvious edema.  She has multiple drug allergies/intolerances as detailed below including prior antiarrhythmics - amiodarone, Rythmol, and Multaq.   Allergies  Allergen Reactions  . Amiodarone Shortness Of Breath    Stopped in Feb 2017 secondary to pulmonary complications  . Benicar [Olmesartan]     Unclear of the intolerance  . Bystolic [Nebivolol Hcl]   . Clonidine Derivatives   . Exforge [Amlodipine Besylate-Valsartan] Other (See Comments)    Unclear  . Hctz [Hydrochlorothiazide]     Currently taking without problem; question if this has to do with hypokalemia  . Hydralazine Other (See Comments)    Also tolerated, but had orthostatic changes on standing medication  . Lisinopril   . Multaq [Dronedarone] Nausea And Vomiting  . Rythmol  [Propafenone]   . Statins   . Tekturna [Aliskiren]   . Coreg [Carvedilol] Other (See Comments)    fatigue  . Prednisone     Jittery     Home Medications No current facility-administered medications on file prior to encounter.    Current Outpatient Prescriptions on File Prior to Encounter  Medication Sig Dispense Refill  . anastrozole (ARIMIDEX) 1 MG tablet Take 1 mg by mouth daily.    Marland Kitchen aspirin 81 MG tablet Take 81 mg by mouth daily.    . Cholecalciferol (VITAMIN D3) 1000 units CAPS Take 1 tablet by mouth daily.  3  . hydrochlorothiazide (HYDRODIURIL) 25 MG tablet Take 25 mg by mouth every other day.     Marland Kitchen rOPINIRole (REQUIP) 1 MG tablet Take 1 tablet by mouth daily.  3  . valsartan (DIOVAN) 320 MG tablet Take 1 tablet (320 mg total) by mouth every morning. 90 tablet 3  . acetaminophen (TYLENOL) 500 MG tablet Take 500 mg by mouth daily as needed.      Past Medical History:  Diagnosis Date  . Ankle edema    Mild, which is intermittent, usually mildly dependent, left slightly greater than the right.  . Anxiety   . Arthritis    In her knees  . Balance problem    Due to weakened knee  . Corneal edema    Severe corneal edema in right eye with multiple folds. Developed after cataract surgery 05/03/09  . GERD (gastroesophageal reflux disease)   . H/O echocardiogram    Echo 03/01/11 EF = >55%. Shows normal systolic function with mild to moderate LV hypertrophy. Left  atrial dimension 3.8 cm & a pulmonary artery pressure of 38. There was breakthrough diastolic dysfunction.  . Hematuria    Resolved after coumadin was stopped  . Hiatal hernia    With GERD  . Hyperglycemia    Random, without any history of diabetes  . Hyperlipidemia   . Hypertension    Difficult to control. Renal Doppler 06/02/10 showed less that 60% bilateral renal artery narrowing.  . Hypokalemia   . Paroxysmal atrial fibrillation (HCC)    On amiodarone but not on anticoagulant  . Posthemorrhagic anemia   .  Pseudophakia   . Syncope 02/22/11   During OV on 02/22/11 her INR was 7.3 and was given 2.5 mg of vitamin K. Later that day she became diaphoretic & fainted.  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 BREAST BIOPSY    . CATARACT EXTRACTION  05/03/09  . CHOLECYSTECTOMY    . HEMORRHOID SURGERY    . Remote hysterectomy    . TONSILLECTOMY    . TOTAL KNEE ARTHROPLASTY Right 08/26/10   By Dr. Elta Guadeloupe C. Yates. Cemented, computer assist    Family History  Problem Relation Age of Onset  . Cancer Mother     Mouth  . Hypertension Mother   . Coronary artery disease Brother   . Diabetes Brother     Type 2  . Hypertension Brother   . Heart attack Sister   . Hypertension Sister     Social History Ms. Sobel reports that she has never smoked. She has never used smokeless tobacco. Ms. Kalisch reports that she does not drink alcohol.  Review of Systems Complete review of systems negative except as otherwise outlined in the clinical summary and also the following. Diminished hearing. Chronic dyspnea.  Physical Examination Temp:  [99.1 F (37.3 C)] 99.1 F (37.3 C) (10/11 1611) Pulse Rate:  [134-158] 147 (10/11 1930) Resp:  [15-24] 15 (10/11 1930) BP: (101-153)/(66-97) 101/83 (10/11 1930) SpO2:  [96 %-99 %] 96 % (10/11 1930) Weight:  [138 lb (62.6 kg)] 138 lb (62.6 kg) (10/11 1612) No intake or output data in the 24 hours ending 03/29/16 1953  Telemetry: Atrial fibrillation with heart rate in the 120s.  Short statured elderly woman in no distress. HEENT: Conjunctiva and lids normal, oropharynx clear. Neck: Supple, no elevated JVP, no thyromegaly. Lungs: Diminished breath sounds without wheezing, nonlabored breathing at rest. Cardiac: Irregularly irregular, no S3 or significant systolic murmur, no pericardial rub. Abdomen: Soft, nontender, bowel sounds present, no guarding or rebound. Extremities: No pitting edema, distal pulses  2+. Skin: Warm and dry. Musculoskeletal: Kyphosis noted. Neuropsychiatric: Alert and oriented x3, affect grossly appropriate.   Lab Results  Basic Metabolic Panel:  Recent Labs Lab 03/29/16 1625  NA 140  K 3.8  CL 110  CO2 22  GLUCOSE 98  BUN 18  CREATININE 0.71  CALCIUM 9.7    CBC:  Recent Labs Lab 03/29/16 1625  WBC 6.1  HGB 13.0  HCT 39.3  MCV 78.3  PLT 282    Cardiac Enzymes:  Recent Labs Lab 03/29/16 1625  TROPONINI 0.28*    BNP: 699  Imaging Chest x-ray 03/29/2016: FINDINGS: Cardiac shadow is mildly enlarged. A hiatal hernia is again seen. Chronic interstitial changes are noted bilaterally without focal confluent infiltrate. Degenerative changes of thoracic spine are noted.  IMPRESSION: Chronic changes without acute abnormality.  Impression  1. Recurrent, rapid atrial fibrillation, onset possibly on  Monday based on patient description. She has known history of PAF with multiple drug intolerances/allergies as outlined above, most recently taken off of amiodarone. She has also not been anticoagulated long-term with concerns about fall and syncope. CHADSVASC score is 4. LVEF reportedly 55% as of 2012.  2. Essential hypertension, blood pressure low normal at this time. She is on Diovan at home as well as HCTZ.  3. Mildly elevated troponin I in the absence of chest pain, at this point would suspect demand ischemia with rapid atrial fibrillation. No reported history of ischemic heart disease.  Recommendations  Patient will be admitted to the cardiology service for further management. Plan to continue intravenous diltiazem as well as heparin infusion. Would cycle cardiac markers, follow-up ECG and echocardiogram tomorrow. Would plan on holding Diovan and HCTZ for now to provide additional blood pressure room for up titration of rate control medication. I did discuss briefly with Dr. Ellyn Hack, decision will need to be made regarding plan to either  continue heart rate control strategy with or without anticoagulation, versus trying to maintain sinus rhythm which may be a difficult endeavor. Suggest EP consultation.  Satira Sark, M.D., F.A.C.C.

## 2016-03-29 NOTE — ED Notes (Signed)
MD at bedside. 

## 2016-03-29 NOTE — ED Provider Notes (Signed)
[facility] DEPT Provider Note   CSN: [id number] Arrival date & time: 03/29/16  1600     History   Chief Complaint Chief Complaint  Patient presents with  . Tachycardia    HPI Sara [name] is a 82 y.o. female.   Palpitations   This is a recurrent problem. The current episode started yesterday. The problem occurs constantly. The problem has not changed since onset.On average, each episode lasts 32 hours. Associated symptoms include irregular heartbeat and dizziness. Pertinent negatives include no diaphoresis, no fever, no chest pain, no chest pressure, no abdominal pain, no nausea, no vomiting, no back pain, no cough and no shortness of breath.    Past Medical History:  Diagnosis Date  . Ankle edema    Mild, which is intermittent, usually mildly dependent, left slightly greater than the right.  . Anxiety   . Arthritis    In her knees  . Balance problem    Due to weakened knee  . Corneal edema    Severe corneal edema in right eye with multiple folds. Developed after cataract surgery 05/03/09  . GERD (gastroesophageal reflux disease)   . H/O echocardiogram    Echo 03/01/11 EF = >55%. Shows normal systolic function with mild to moderate LV hypertrophy. Left atrial dimension 3.8 cm & a pulmonary artery pressure of 38. There was breakthrough diastolic dysfunction.  . Hematuria    Resolved after coumadin was stopped  . Hiatal hernia    With GERD  . Hyperglycemia    Random, without any history of diabetes  . Hyperlipidemia   . Hypertension    Difficult to control. Renal Doppler 06/02/10 showed less that 60% bilateral renal artery narrowing.  . Hypokalemia   . Paroxysmal atrial fibrillation (HCC)    On amiodarone but not on anticoagulant  . Posthemorrhagic anemia   . Pseudophakia   . Syncope 02/22/11   During OV on 02/22/11 her INR was 7.3 and was given 2.5 mg of vitamin K. Later that day she became diaphoretic & fainted.  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 Cardiac related syncope -although etiology not clear 03/26/2016  . 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 Obesity (BMI 30-39.9) 03/18/2013  . PAF (paroxysmal atrial fibrillation) ([place]) 03/18/2013  . Hypertension goal BP (blood pressure) < 180/100 03/18/2013    Past Surgical History:  Procedure Laterality Date  . BREAST BIOPSY    . CATARACT EXTRACTION  05/03/09  . CHOLECYSTECTOMY    . HEMORRHOID SURGERY    . Remote hysterectomy    . TONSILLECTOMY    . TOTAL KNEE ARTHROPLASTY Right 08/26/10   By Dr. Elta Guadeloupe C. Yates. Cemented, computer assist    OB History    No data available       Home Medications    Prior to Admission medications   Medication Sig Start Date End Date Taking? Authorizing Provider  acetaminophen (TYLENOL) 500 MG tablet Take 500 mg by mouth daily as needed.    Historical Provider, MD  anastrozole (ARIMIDEX) 1 MG tablet Take 1 mg by mouth daily.    Historical Provider, MD  aspirin 81 MG tablet Take 81 mg by mouth daily.    Historical Provider, MD  Cholecalciferol (VITAMIN D3) 1000 units CAPS Take 1 tablet by mouth daily. 02/24/16   Historical Provider, MD  hydrochlorothiazide (HYDRODIURIL) 25 MG tablet Take 25 mg by mouth daily.    Historical Provider,  MD  rOPINIRole (REQUIP) 1 MG tablet Take 1 tablet by mouth daily. 02/18/16   Historical Provider, MD  valsartan (DIOVAN) 320 MG tablet Take 1 tablet (320 mg total) by mouth every morning. 08/17/15   Erlene Quan, PA-C    Family History Family History  Problem Relation Age of Onset  . Cancer Mother     Mouth  . Hypertension Mother   . Coronary artery disease Brother   . Diabetes Brother     Type 2  . Hypertension Brother   . Heart attack Sister   . Hypertension Sister     Social History Social History  Substance Use Topics  . Smoking status: Never Smoker  . Smokeless tobacco: Never Used  . Alcohol use No     Allergies   Amiodarone;  Benicar [olmesartan]; Bystolic [nebivolol hcl]; Clonidine derivatives; Exforge [amlodipine besylate-valsartan]; Hctz [hydrochlorothiazide]; Hydralazine; Lisinopril; Multaq [dronedarone]; Rythmol [propafenone]; Statins; Tekturna [aliskiren]; Coreg [carvedilol]; and Prednisone   Review of Systems Review of Systems  Constitutional: Negative for chills, diaphoresis and fever.  HENT: Negative for ear pain and sore throat.   Eyes: Negative for pain and visual disturbance.  Respiratory: Negative for cough and shortness of breath.   Cardiovascular: Positive for palpitations. Negative for chest pain.  Gastrointestinal: Negative for abdominal pain, nausea and vomiting.  Genitourinary: Negative for dysuria and hematuria.  Musculoskeletal: Negative for arthralgias and back pain.  Skin: Negative for color change and rash.  Neurological: Positive for dizziness and light-headedness. Negative for seizures and syncope.  All other systems reviewed and are negative.    Physical Exam Updated Vital Signs BP 101/71   Pulse (!) 158   Temp 99.1 F (37.3 C)   Resp 18   Ht 5' (1.524 m)   Wt 62.6 kg   SpO2 99%   BMI 26.95 kg/m   Physical Exam  Constitutional: She appears well-developed and well-nourished.  HENT:  Head: Normocephalic and atraumatic.  Eyes: Conjunctivae and EOM are normal. Pupils are equal, round, and reactive to light.  Neck: Normal range of motion. Neck supple.  Cardiovascular:  Irregular tachycardia  Pulmonary/Chest: Effort normal and breath sounds normal. No respiratory distress.  Abdominal: Soft. There is no tenderness.  Musculoskeletal: She exhibits no edema.  Neurological: She is alert.  Skin: Skin is warm and dry.  Psychiatric: She has a normal mood and affect.  Nursing note and vitals reviewed.    ED Treatments / Results  Labs (all labs ordered are listed, but only abnormal results are displayed) Labs Reviewed  CBC - Abnormal; Notable for the following:        Result Value   MCH 25.9 (*)    RDW 16.1 (*)    All other components within normal limits  TROPONIN I - Abnormal; Notable for the following:    Troponin I 0.28 (*)    All other components within normal limits  BRAIN NATRIURETIC PEPTIDE - Abnormal; Notable for the following:    B Natriuretic Peptide 699.6 (*)    All other components within normal limits  CBC - Abnormal; Notable for the following:    Hemoglobin 11.7 (*)    MCH 25.7 (*)    RDW 16.1 (*)    All other components within normal limits  TROPONIN I - Abnormal; Notable for the following:    Troponin I 0.26 (*)    All other components within normal limits  TROPONIN I - Abnormal; Notable for the following:    Troponin I 0.27 (*)  All other components within normal limits  TROPONIN I - Abnormal; Notable for the following:    Troponin I 0.20 (*)    All other components within normal limits  BASIC METABOLIC PANEL - Abnormal; Notable for the following:    Potassium 3.3 (*)    Chloride 113 (*)    All other components within normal limits  BASIC METABOLIC PANEL  HEPARIN LEVEL (UNFRACTIONATED)    EKG  EKG Interpretation  Date/Time:  Wednesday March 29 2016 16:05:46 EDT Ventricular Rate:  152 PR Interval:    QRS Duration: 128 QT Interval:  306 QTC Calculation: 486 R Axis:   148 Text Interpretation:  Atrial fibrillation with rapid ventricular response Right bundle branch block Possible Lateral infarct , age undetermined Abnormal ECG Since last tracing rate faster Right bundle branch block NOW PRESENT Confirmed by Sabra Heck  MD, BRIAN ([id number]) on 03/29/2016 4:22:57 PM       Radiology Dg Chest 2 View  Result Date: 03/29/2016 CLINICAL DATA:  Atrial fibrillation EXAM: CHEST  2 VIEW COMPARISON:  08/23/2010 FINDINGS: Cardiac shadow is mildly enlarged. A hiatal hernia is again seen. Chronic interstitial changes are noted bilaterally without focal confluent infiltrate. Degenerative changes of thoracic spine are noted. IMPRESSION:  Chronic changes without acute abnormality. Electronically Signed   By: Inez Catalina M.D.   On: 03/29/2016 17:07    Procedures Procedures (including critical care time)  Medications Ordered in ED Medications  diltiazem (CARDIZEM) 1 mg/mL load via infusion 20 mg (20 mg Intravenous Bolus from Bag 03/29/16 1851)    And  diltiazem (CARDIZEM) 100 mg in dextrose 5% 175mL (1 mg/mL) infusion (10 mg/hr Intravenous New Bag/Given 03/30/16 1100)  cholecalciferol (VITAMIN D) tablet 1,000 Units (1,000 Units Oral Given 03/30/16 1119)  rOPINIRole (REQUIP) tablet 1 mg (1 mg Oral Given 03/30/16 1121)  anastrozole (ARIMIDEX) tablet 1 mg (1 mg Oral Given 03/30/16 1119)  aspirin EC tablet 81 mg (81 mg Oral Given 03/30/16 1135)  acetaminophen (TYLENOL) tablet 500 mg (not administered)  ondansetron (ZOFRAN) injection 4 mg (not administered)  ALPRAZolam (XANAX) tablet 0.25 mg (0.25 mg Oral Given 03/29/16 2258)  Influenza vac split quadrivalent PF (FLUARIX) injection 0.5 mL (not administered)  metoprolol tartrate (LOPRESSOR) tablet 12.5 mg (12.5 mg Oral Given 03/30/16 1122)  apixaban (ELIQUIS) tablet 2.5 mg (2.5 mg Oral Given 03/30/16 1219)  heparin bolus via infusion 3,000 Units (3,000 Units Intravenous Bolus from Bag 03/29/16 1928)  potassium chloride SA (K-DUR,KLOR-CON) CR tablet 40 mEq (40 mEq Oral Given 03/30/16 1122)  digoxin (LANOXIN) 0.25 MG/ML injection 0.5 mg (0.5 mg Intravenous Given 03/30/16 1123)     Initial Impression / Assessment and Plan / ED Course  I have reviewed the triage vital signs and the nursing notes.  Pertinent labs & imaging results that were available during my care of the patient were reviewed by me and considered in my medical decision making (see chart for details).  Clinical Course   Ms. Amonett is an 82 year old female with PMH significant for paroxsymal atrial fibrillation.    She had been controlled with amiodarone but stopped for lung involvement.  EKG demonstrates a fib  with RVR.  CXR ordered, personally reviewed by me, demonstrates chronic changes with no acute cardiac or pulmonary processes.  Labs ordered demonstrate elevated BMP, positive troponin.  Patient started on heparin and diltiazem for rate control and nstemi.  Cardiology consulted and will admit.  Final Clinical Impressions(s) / ED Diagnoses   Final diagnoses:  A-fib ([place])  Atrial fibrillation with RVR [facility])    New Prescriptions New Prescriptions   No medications on file     Elveria Rising, MD 03/30/16 [place], MD 04/01/16 0002

## 2016-03-29 NOTE — ED Notes (Signed)
Critical Lab Value:  0.28 Troponin Drs. Marlou Sa and Sabra Heck made aware.

## 2016-03-29 NOTE — ED Provider Notes (Signed)
Patient is an 82 year old female, she has a history of paroxysmal atrial fibrillation since the age of 19, she currently states that she's had proximally 4 days of palpitations, today she was feeling more weak with this shortness of breath. She noticed that her heart was racing fast, on exam the patient does have a tachycardia, irregularly irregular, strong pulses, no peripheral edema or JVD. Her lungs are otherwise clear, abdomen is soft. EKG does show atrial fibrillation with a rapid ventricular rate, she does have a right bundle branch block. She will need further evaluation by cardiology, at this time her troponin is elevated, she is requiring Cardizem with a Cardizem drip, heparin, will discuss with cardiology but the patient does appear critically ill and will need to be admitted to a high level of care. At this time I feel uncomfortable cardioverting the patient given the length of time that she has had symptoms which amounts to approximately 4 days.  CRITICAL CARE Performed by: Johnna Acosta Total critical care time: 35 minutes Critical care time was exclusive of separately billable procedures and treating other patients. Critical care was necessary to treat or prevent imminent or life-threatening deterioration. Critical care was time spent personally by me on the following activities: development of treatment plan with patient and/or surrogate as well as nursing, discussions with consultants, evaluation of patient's response to treatment, examination of patient, obtaining history from patient or surrogate, ordering and performing treatments and interventions, ordering and review of laboratory studies, ordering and review of radiographic studies, pulse oximetry and re-evaluation of patient's condition.    EKG Interpretation  Date/Time:  Wednesday March 29 2016 16:05:46 EDT Ventricular Rate:  152 PR Interval:    QRS Duration: 128 QT Interval:  306 QTC Calculation: 486 R Axis:   148 Text  Interpretation:  Atrial fibrillation with rapid ventricular response Right bundle branch block Possible Lateral infarct , age undetermined Abnormal ECG Since last tracing rate faster Right bundle branch block NOW PRESENT Confirmed by Sabra Heck  MD, Irving Lubbers ([id number]) on 03/29/2016 4:22:57 PM Also confirmed by Sabra Heck  MD, Leia Coletti ([id number]), editor Rolla Plate, Joelene Millin [phone])  on 03/29/2016 4:31:32 PM      Final diagnoses:  A-fib ([place])  Atrial fibrillation with RVR ([place])     I saw and evaluated the patient, reviewed the resident's note and I agree with the findings and plan.     Noemi Chapel, MD 04/01/16 0000

## 2016-03-29 NOTE — ED Triage Notes (Signed)
The pt has a history of atrial fib and since yesterday she has had a fast irregular rhy.  No pain  She reports that she knew what it was.. She saw her regular doctor this am and was told to come here for her af  No sob

## 2016-03-29 NOTE — Progress Notes (Signed)
ANTICOAGULATION CONSULT NOTE - Initial Consult  Pharmacy Consult for heparin Indication: atrial fibrillation w/ fast irregular rhythm since yesterday, told to come to ED by outpatient provider  Allergies  Allergen Reactions  . Amiodarone Shortness Of Breath    Stopped in Feb 2017 secondary to pulmonary complications  . Benicar [Olmesartan]     Unclear of the intolerance  . Bystolic [Nebivolol Hcl]   . Clonidine Derivatives   . Exforge [Amlodipine Besylate-Valsartan] Other (See Comments)    Unclear  . Hctz [Hydrochlorothiazide]     Currently taking without problem; question if this has to do with hypokalemia  . Hydralazine Other (See Comments)    Also tolerated, but had orthostatic changes on standing medication  . Lisinopril   . Multaq [Dronedarone] Nausea And Vomiting  . Rythmol [Propafenone]   . Statins   . Tekturna [Aliskiren]   . Coreg [Carvedilol] Other (See Comments)    fatigue  . Prednisone     Jittery     Patient Measurements: Height: 5' (152.4 cm) Weight: 138 lb (62.6 kg) IBW/kg (Calculated) : 45.5 Heparin Dosing Weight: 56.5 kg  Vital Signs: Temp: 99.1 F (37.3 C) (10/11 1611) BP: 153/94 (10/11 1745) Pulse Rate: 137 (10/11 1745)  Labs:  Recent Labs  03/29/16 1625  HGB 13.0  HCT 39.3  PLT 282  CREATININE 0.71  TROPONINI 0.28*    Estimated Creatinine Clearance: 40.9 mL/min (by C-G formula based on SCr of 0.71 mg/dL).  Clearance is stable   Medical History: Past Medical History:  Diagnosis Date  . Ankle edema    Mild, which is intermittent, usually mildly dependent, left slightly greater than the right.  . Anxiety   . Arthritis    In her knees  . Balance problem    Due to weakened knee  . Corneal edema    Severe corneal edema in right eye with multiple folds. Developed after cataract surgery 05/03/09  . GERD (gastroesophageal reflux disease)   . H/O echocardiogram    Echo 03/01/11 EF = >55%. Shows normal systolic function with mild to  moderate LV hypertrophy. Left atrial dimension 3.8 cm & a pulmonary artery pressure of 38. There was breakthrough diastolic dysfunction.  . Hematuria    Resolved after coumadin was stopped  . Hiatal hernia    With GERD  . Hyperglycemia    Random, without any history of diabetes  . Hyperlipidemia   . Hypertension    Difficult to control. Renal Doppler 06/02/10 showed less that 60% bilateral renal artery narrowing.  . Hypokalemia   . Paroxysmal atrial fibrillation (HCC)    On amiodarone but not on anticoagulant  . Posthemorrhagic anemia   . Pseudophakia   . Syncope 02/22/11   During OV on 02/22/11 her INR was 7.3 and was given 2.5 mg of vitamin K. Later that day she became diaphoretic & fainted.  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 Heparin appropriate to ppx thromboembolic complications.  Goal of Therapy:  Heparin level 0.3-0.7 units/ml Monitor platelets by anticoagulation protocol: Yes   Plan:  Heparin 3000 unit bolus IV x1 Heparin 800 units/hr IV Heparin level at steady state Monitor for s/sx of bleed    Cheral Almas, PharmD Candidate 03/29/2016,6:34 PM

## 2016-03-30 ENCOUNTER — Observation Stay (HOSPITAL_BASED_OUTPATIENT_CLINIC_OR_DEPARTMENT_OTHER): Payer: Commercial Managed Care - HMO

## 2016-03-30 ENCOUNTER — Encounter (HOSPITAL_COMMUNITY): Payer: Self-pay | Admitting: General Practice

## 2016-03-30 DIAGNOSIS — I4510 Unspecified right bundle-branch block: Secondary | ICD-10-CM | POA: Diagnosis present

## 2016-03-30 DIAGNOSIS — J984 Other disorders of lung: Secondary | ICD-10-CM | POA: Diagnosis not present

## 2016-03-30 DIAGNOSIS — Z7982 Long term (current) use of aspirin: Secondary | ICD-10-CM | POA: Diagnosis not present

## 2016-03-30 DIAGNOSIS — Z8249 Family history of ischemic heart disease and other diseases of the circulatory system: Secondary | ICD-10-CM | POA: Diagnosis not present

## 2016-03-30 DIAGNOSIS — Z833 Family history of diabetes mellitus: Secondary | ICD-10-CM | POA: Diagnosis not present

## 2016-03-30 DIAGNOSIS — I248 Other forms of acute ischemic heart disease: Secondary | ICD-10-CM | POA: Diagnosis present

## 2016-03-30 DIAGNOSIS — Z800 Family history of malignant neoplasm of digestive organs: Secondary | ICD-10-CM | POA: Diagnosis not present

## 2016-03-30 DIAGNOSIS — Z96651 Presence of right artificial knee joint: Secondary | ICD-10-CM | POA: Diagnosis present

## 2016-03-30 DIAGNOSIS — R002 Palpitations: Secondary | ICD-10-CM | POA: Diagnosis present

## 2016-03-30 DIAGNOSIS — R55 Syncope and collapse: Secondary | ICD-10-CM

## 2016-03-30 DIAGNOSIS — E785 Hyperlipidemia, unspecified: Secondary | ICD-10-CM | POA: Diagnosis present

## 2016-03-30 DIAGNOSIS — K449 Diaphragmatic hernia without obstruction or gangrene: Secondary | ICD-10-CM | POA: Diagnosis present

## 2016-03-30 DIAGNOSIS — I4891 Unspecified atrial fibrillation: Secondary | ICD-10-CM

## 2016-03-30 DIAGNOSIS — K219 Gastro-esophageal reflux disease without esophagitis: Secondary | ICD-10-CM | POA: Diagnosis present

## 2016-03-30 DIAGNOSIS — I495 Sick sinus syndrome: Secondary | ICD-10-CM

## 2016-03-30 DIAGNOSIS — I480 Paroxysmal atrial fibrillation: Principal | ICD-10-CM

## 2016-03-30 DIAGNOSIS — R739 Hyperglycemia, unspecified: Secondary | ICD-10-CM | POA: Diagnosis present

## 2016-03-30 DIAGNOSIS — I340 Nonrheumatic mitral (valve) insufficiency: Secondary | ICD-10-CM | POA: Diagnosis not present

## 2016-03-30 DIAGNOSIS — I119 Hypertensive heart disease without heart failure: Secondary | ICD-10-CM | POA: Diagnosis present

## 2016-03-30 DIAGNOSIS — F419 Anxiety disorder, unspecified: Secondary | ICD-10-CM | POA: Diagnosis present

## 2016-03-30 LAB — CBC
HCT: 36 % (ref 36.0–46.0)
HEMOGLOBIN: 11.7 g/dL — AB (ref 12.0–15.0)
MCH: 25.7 pg — ABNORMAL LOW (ref 26.0–34.0)
MCHC: 32.5 g/dL (ref 30.0–36.0)
MCV: 78.9 fL (ref 78.0–100.0)
Platelets: 242 10*3/uL (ref 150–400)
RBC: 4.56 MIL/uL (ref 3.87–5.11)
RDW: 16.1 % — ABNORMAL HIGH (ref 11.5–15.5)
WBC: 5.1 10*3/uL (ref 4.0–10.5)

## 2016-03-30 LAB — BASIC METABOLIC PANEL
ANION GAP: 6 (ref 5–15)
BUN: 14 mg/dL (ref 6–20)
CO2: 23 mmol/L (ref 22–32)
Calcium: 9 mg/dL (ref 8.9–10.3)
Chloride: 113 mmol/L — ABNORMAL HIGH (ref 101–111)
Creatinine, Ser: 0.62 mg/dL (ref 0.44–1.00)
Glucose, Bld: 92 mg/dL (ref 65–99)
POTASSIUM: 3.3 mmol/L — AB (ref 3.5–5.1)
SODIUM: 142 mmol/L (ref 135–145)

## 2016-03-30 LAB — ECHOCARDIOGRAM COMPLETE
Height: 60 in
WEIGHTICAEL: 2208 [oz_av]

## 2016-03-30 LAB — HEPARIN LEVEL (UNFRACTIONATED): HEPARIN UNFRACTIONATED: 0.3 [IU]/mL (ref 0.30–0.70)

## 2016-03-30 LAB — TROPONIN I
TROPONIN I: 0.2 ng/mL — AB (ref <0.03)
Troponin I: 0.27 ng/mL (ref <0.03)

## 2016-03-30 MED ORDER — METOPROLOL TARTRATE 12.5 MG HALF TABLET
12.5000 mg | ORAL_TABLET | Freq: Four times a day (QID) | ORAL | Status: DC
  Administered 2016-03-30 – 2016-04-01 (×9): 12.5 mg via ORAL
  Filled 2016-03-30 (×10): qty 1

## 2016-03-30 MED ORDER — APIXABAN 2.5 MG PO TABS
2.5000 mg | ORAL_TABLET | Freq: Two times a day (BID) | ORAL | Status: DC
  Administered 2016-03-30 – 2016-04-01 (×5): 2.5 mg via ORAL
  Filled 2016-03-30 (×5): qty 1

## 2016-03-30 MED ORDER — INFLUENZA VAC SPLIT QUAD 0.5 ML IM SUSY: 0.5000 mL | PREFILLED_SYRINGE | INTRAMUSCULAR | Status: DC | PRN

## 2016-03-30 MED ORDER — POTASSIUM CHLORIDE CRYS ER 20 MEQ PO TBCR
40.0000 meq | EXTENDED_RELEASE_TABLET | Freq: Once | ORAL | Status: AC
  Administered 2016-03-30: 40 meq via ORAL
  Filled 2016-03-30: qty 2

## 2016-03-30 MED ORDER — DIGOXIN 0.25 MG/ML IJ SOLN
0.5000 mg | Freq: Once | INTRAMUSCULAR | Status: AC
  Administered 2016-03-30: 0.5 mg via INTRAVENOUS
  Filled 2016-03-30: qty 2

## 2016-03-30 MED ORDER — SODIUM CHLORIDE 0.9 % IV SOLN
INTRAVENOUS | Status: DC
  Administered 2016-03-31: 14:00:00 via INTRAVENOUS

## 2016-03-30 NOTE — Consult Note (Addendum)
ELECTROPHYSIOLOGY CONSULT NOTE    Patient ID: Sara [name] MRN: [id number], DOB/AGE: 1925/10/16 82 y.o.  Admit date: 03/29/2016 Date of Consult: 03/30/2016   Primary Physician: Sara Roger, MD Primary Cardiologist: Dr. Ellyn [name] Requesting MD: Dr. Ellyn [name]  Reason for Consultation: concerns for possible tachy-brady  [id number] Sara [name] is a 82 y.o. female with PMHx of AFib, HTN, HLD, anxiety, GERD, ?balance or gait instability/falls, syncope, she was last seen by dr. Ellyn [name] only last [name], he mentions recurrent hospital stays at [facility] secondary to syncope, with unclear etiologies, he reports she convey being told that she would need a pacemaker after acardiac arrest event, she vaguely stated her heart "Stopped".   He mentioned significant difficulty obaining clear data and history, patient seemed unclear on timing of events, possibly post-prandial, her sn could not confirm the detaiils, he reports concerns of both orthostatic at som epoint the PCP reported 41mmHg drop upon standing, as well as vaso-vagal like symptoms.  In discussion with Dr, Sara [name], these events/symptoms have been very difficult to get a handle on with unclear timing if the events given the patient is a poor historian.  She is admitted to the [facility] this time with c/o palpitations noted to be in RAFib, and started on diltiazem gtt as well as a heparin gtt.  Historically was on BB but during one of her hospital stays at another facility appears was stopped, unclear why without records.  Here she has been placed on ow dose Metoprolol with the diltiazem gtt, this afternoon significant improvement in rate control, generally 90's with plans for TEE/DCCV tomorrow.   The patient tells me she has had 5 or six "episodes", all but one have occurred while sitting, none had warning and reportedly can last several minutes or a few seconds.  No CP or SOB.  The patient was referred to the ER with tachycardia and c/o palpitations and  SOB.  She is feeling well at this time without any ongoing complaints.  The patient tells me during one of her hospital visits after a "spell" her coreg was stopped but no one had mentioned to her that her heart was slow, doesnot recall saying that she was told it stopped.    LABS: K+ 3.8 >  3.3 BUN/Creat 14/0.62 Trop 0.28, 0.26, 0.27 H/H 12/39 > 11/36   AFib Hx Amiodarone stopped with concerns for pulm toxicity (BOOP) Hx of hematuria on coumadin Not on a/c secondary to falls and unexplained syncope, advanced age  Past Medical History:  Diagnosis Date  . Ankle edema    Mild, which is intermittent, usually mildly dependent, left slightly greater than the right.  . Anxiety   . Arthritis    In her knees  . Balance problem    Due to weakened knee  . Corneal edema    Severe corneal edema in right eye with multiple folds. Developed after cataract surgery 05/03/09  . GERD (gastroesophageal reflux disease)   . H/O echocardiogram    Echo 03/01/11 EF = >55%. Shows normal systolic function with mild to moderate LV hypertrophy. Left atrial dimension 3.8 cm & a pulmonary artery pressure of 38. There was breakthrough diastolic dysfunction.  . Hematuria    Resolved after coumadin was stopped  . Hiatal hernia    With GERD  . Hyperglycemia    Random, without any history of diabetes  . Hyperlipidemia   . Hypertension    Difficult to control. Renal Doppler 06/02/10 showed less that 60% bilateral renal artery narrowing.  Marland Kitchen  Hypokalemia   . Paroxysmal atrial fibrillation (HCC)    On amiodarone but not on anticoagulant  . Posthemorrhagic anemia   . Pseudophakia   . Syncope 02/22/11   During OV on 02/22/11 her INR was 7.3 and was given 2.5 mg of vitamin K. Later that day she became diaphoretic & fainted.  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 BREAST BIOPSY    . CATARACT EXTRACTION  05/03/09  . CHOLECYSTECTOMY      . HEMORRHOID SURGERY    . Remote hysterectomy    . TONSILLECTOMY    . TOTAL KNEE ARTHROPLASTY Right 08/26/10   By Dr. Elta Guadeloupe C. [name]. Cemented, computer assist     Prescriptions Prior to Admission  Medication Sig Dispense Refill Last Dose  . anastrozole (ARIMIDEX) 1 MG tablet Take 1 mg by mouth daily.   03/28/2016 at Unknown time  . aspirin 81 MG tablet Take 81 mg by mouth daily.   03/29/2016 at Unknown time  . Cholecalciferol (VITAMIN D3) 1000 units CAPS Take 1 tablet by mouth daily.  3 03/29/2016 at Unknown time  . hydrochlorothiazide (HYDRODIURIL) 25 MG tablet Take 25 mg by mouth every other day.    03/28/2016 at Unknown time  . rOPINIRole (REQUIP) 1 MG tablet Take 1 tablet by mouth daily.  3 03/28/2016 at Unknown time  . valsartan (DIOVAN) 320 MG tablet Take 1 tablet (320 mg total) by mouth every morning. 90 tablet 3 03/28/2016 at Unknown time  . acetaminophen (TYLENOL) 500 MG tablet Take 500 mg by mouth daily as needed.   Not Taking at Unknown time    Inpatient Medications:  . anastrozole  1 mg Oral Daily  . apixaban  2.5 mg Oral BID  . aspirin EC  81 mg Oral Daily  . cholecalciferol  1,000 Units Oral Daily  . metoprolol tartrate  12.5 mg Oral Q6H  . rOPINIRole  1 mg Oral Daily    Allergies:  Allergies  Allergen Reactions  . Amiodarone Shortness Of Breath    Stopped in Feb 2017 secondary to pulmonary complications  . Benicar [Olmesartan]     Unclear of the intolerance  . Bystolic [Nebivolol Hcl]   . Clonidine Derivatives   . Exforge [Amlodipine Besylate-Valsartan] Other (See Comments)    Unclear  . Hctz [Hydrochlorothiazide]     Currently taking without problem; question if this has to do with hypokalemia  . Hydralazine Other (See Comments)    Also tolerated, but had orthostatic changes on standing medication  . Lisinopril   . Multaq [Dronedarone] Nausea And Vomiting  . Rythmol [Propafenone]   . Statins   . Tekturna [Aliskiren]   . Coreg [Carvedilol] Other (See  Comments)    fatigue  . Prednisone     Jittery     Social History   Social History  . Marital status: Widowed    Spouse name: N/A  . Number of children: 1  . Years of education: N/A   Occupational History  .  Retired    Social History Main Topics  . Smoking status: Never Smoker  . Smokeless tobacco: Never Used  . Alcohol use No  . Drug use: No  . Sexual activity: Not on file   Other Topics Concern  . Not on file   Social History Narrative   She is a widowed mother of one and grandmother of one.  She does not  exercise.  She does not smoke and does not drink alcohol.     Family History  Problem Relation Age of Onset  . Cancer Mother     Mouth  . Hypertension Mother   . Coronary artery disease Brother   . Diabetes Brother     Type 2  . Hypertension Brother   . Heart attack Sister   . Hypertension Sister      Review of Systems: All other systems reviewed and are otherwise negative except as noted above.  Physical Exam: Vitals:   03/29/16 2144 03/30/16 0443 03/30/16 1119 03/30/16 1400  BP: (!) 148/80 (!) 118/59 140/85 (!) 128/45  Pulse: (!) 134 (!) 101 96 62  Resp: 20 20  18   Temp: 98.2 F (36.8 C) 97.6 F (36.4 C)  97.7 F (36.5 C)  TempSrc: Oral Oral  Oral  SpO2: 98% 96%  98%  Weight:      Height:        GEN- The patient is well appearing, alert and oriented x 3 today.   HEENT: normocephalic, atraumatic; sclera clear, conjunctiva pink; hearing intact; oropharynx clear; neck supple, no JVP Lymph- no cervical lymphadenopathy Lungs- Clear to ausculation bilaterally, normal work of breathing.  No wheezes, rales, rhonchi Heart- IRRR, no murmurs, rubs or gallops, PMI not laterally displaced GI- soft, non-tender, non-distended Extremities- no clubbing, cyanosis, or edema MS- no significant deformity, age appropriate atrophy Skin- warm and dry, no rash or lesion Psych- euthymic mood, full affect Neuro- no gross deficits observed  Labs:   Lab Results    Component Value Date   WBC 5.1 03/30/2016   HGB 11.7 (L) 03/30/2016   HCT 36.0 03/30/2016   MCV 78.9 03/30/2016   PLT 242 03/30/2016    Recent Labs Lab 03/30/16 0358  NA 142  K 3.3*  CL 113*  CO2 23  BUN 14  CREATININE 0.62  CALCIUM 9.0  GLUCOSE 92      Radiology/Studies:  Dg Chest 2 View Result Date: 03/29/2016 CLINICAL DATA:  Atrial fibrillation EXAM: CHEST  2 VIEW COMPARISON:  08/23/2010 FINDINGS: Cardiac shadow is mildly enlarged. A hiatal hernia is again seen. Chronic interstitial changes are noted bilaterally without focal confluent infiltrate. Degenerative changes of thoracic spine are noted. IMPRESSION: Chronic changes without acute abnormality. Electronically Signed   By: Inez Catalina M.D.   On: 03/29/2016 17:07    EKG: Afib 152bpm, RBBB is new TELEMETRY: AFib 150's >>  currently 90's, occ slight pauses up to 2 seconds, no significant bradycardia  03/30/16: TTE Study Conclusions - Left ventricle: The cavity size was normal. Wall thickness was   increased in a pattern of mild LVH. Systolic function was   vigorous. The estimated ejection fraction was in the range of 65%   to 70%. Wall motion was normal; there were no regional wall   motion abnormalities. Doppler parameters are consistent with high   ventricular filling pressure. - Aortic valve: There was trivial regurgitation. - Mitral valve: Calcified annulus. Mildly thickened leaflets . - Left atrium: The atrium was severely dilated. - Right atrium: The atrium was mildly dilated. - Pulmonary arteries: Systolic pressure was mildly increased. PA   peak pressure: 37 mm Hg (S). Impressions: - Vigorous LV systolic function with intracavitary gradient of 1.7   m/s; mild LVH; elevated LV filling pressure; biatrial   enlargement; trace AI; mild TR with mildly elevated pulmonary   pressure.  Assessment and Plan:   1. PAfib RVR     On  heparin gtt currently, CHA2DS2Vasc is at least 4, hitorically not on a/c with  unexplained syncope/falls, advanced age     Pending TEE/DCCV tomorrow     Rate is better with current  2. Syncope "episodes"      Unclear if bradycardia plays a role, no clear or known documentation of bradycardia  3. HTN      Stable  4. Abnormal Trop     No CP, not felt to represent ACS, likely secondary to RVR  She may benefit from oop implant to better identify if HR plays a role in her syncope.  She is pending TEE/DCCV tomorrow.  The patient is not quite on-board wants to discuss with her son and Dr. Ellyn [name] further.  SignedTommye Standard, PA-C 03/30/2016 2:35 PM   I have seen and examined this patient with Tommye Standard.  Agree with above, note added to reflect my findings.  On exam, iRRR, no murmurs, lungs clear.  Presented to the hospital with AF with RVR and syncope.  Rate controlled today but with plan for TEE/CV tomorrow.  On Eliquis. Has been having episodes of syncope which occur at times when sitting. No history of palpitations around the time of her syncope.  Due to episodes of recurrent syncope, which has occurred once in 2017, and multiple times in 2016, feel that LINQ monitor would be indicated.  Patient worried about cost of the monitor.  Sara [name] check with case management. Risks and benefits discussed.  Risks include bleeding and infection. Patient understands the risks and has agreed.    Ritchie Klee M. Holy B[place]field MD 03/30/2016 8:31 PM

## 2016-03-30 NOTE — Progress Notes (Signed)
ANTICOAGULATION CONSULT NOTE - Follow Up Consult  Pharmacy Consult for Heparin> Eliquis Indication: atrial fibrillation  Allergies  Allergen Reactions  . Amiodarone Shortness Of Breath    Stopped in Feb 2017 secondary to pulmonary complications  . Benicar [Olmesartan]     Unclear of the intolerance  . Bystolic [Nebivolol Hcl]   . Clonidine Derivatives   . Exforge [Amlodipine Besylate-Valsartan] Other (See Comments)    Unclear  . Hctz [Hydrochlorothiazide]     Currently taking without problem; question if this has to do with hypokalemia  . Hydralazine Other (See Comments)    Also tolerated, but had orthostatic changes on standing medication  . Lisinopril   . Multaq [Dronedarone] Nausea And Vomiting  . Rythmol [Propafenone]   . Statins   . Tekturna [Aliskiren]   . Coreg [Carvedilol] Other (See Comments)    fatigue  . Prednisone     Jittery     Patient Measurements: Height: 5' (152.4 cm) Weight: 138 lb (62.6 kg) IBW/kg (Calculated) : 45.5  Vital Signs: Temp: 97.6 F (36.4 C) (10/12 0443) Temp Source: Oral (10/12 0443) BP: 140/85 (10/12 1119) Pulse Rate: 96 (10/12 1119)  Labs:  Recent Labs  03/29/16 1625 03/29/16 2205 03/30/16 0210 03/30/16 0358 03/30/16 1017  HGB 13.0  --   --  11.7*  --   HCT 39.3  --   --  36.0  --   PLT 282  --   --  242  --   HEPARINUNFRC  --   --  0.30  --   --   CREATININE 0.71  --   --  0.62  --   TROPONINI 0.28* 0.26*  --  0.27* 0.20*    Estimated Creatinine Clearance: 40.9 mL/min (by C-G formula based on SCr of 0.62 mg/dL).   Assessment: Changing from Heparin to apixaban for AF. CHADSVASC = 4 (age >/= 53, HTN, female). Weight 62 kg, SCr 0.62, stable, Age 82. Hgb 11.7, no s/sx bleeding noted. Pt with significant falls history in last several months per note. Per Dr. Ellyn Hack, [place] to give lower dose given falls history, age, and weight close to cut off.    Goal of Therapy:  Monitor platelets by anticoagulation protocol: Yes    Plan:  -d/c heparin -start eliquis 2.5 mg po BID -monitor s/sx bleeding, CBC -watch weight, if significantly increases, may consider increasing eliquis dose -Eliquis education complete   Carlean Jews, Pharm.D. PGY1 Pharmacy Resident 10/12/201712:04 PM Pager [phone]

## 2016-03-30 NOTE — Care Management Obs Status (Signed)
[place] NOTIFICATION   Patient Details  Name: Jasmina Insana MRN: [id number] Date of Birth: [birth year]   Medicare Observation Status Notification Given:  Yes    CrutchfieldAntony Haste, RN 03/30/2016, 1:43 PM

## 2016-03-30 NOTE — Care Management Note (Signed)
Case Management Note  Patient Details  Name: Sara [name] MRN: [id number] Date of Birth: May 11, 1926  Subjective/Objective:  82 y.o. Admitted with SOB and palpitations. Lives with Son in [place] residence in [place]. Has used [facility] health in the past and would use it again if needed. Has RW at home. No CM needs identified at present.                   Action/Plan: Discharge    Expected Discharge Date:                  Expected Discharge Plan:  Home/Self Care  In-House Referral:  NA  Discharge planning Services  CM Consult  Post Acute Care Choice:  NA Choice offered to:  Patient  DME Arranged:   (has RW that she uses out "in the community") DME Agency:  NA  HH Arranged:  NA [place] Agency:  [facility]  Status of Service:  Completed, signed off  If discussed at [place] of Stay Meetings, dates discussed:    Additional Comments: No needs at present.   Dawud Mays, Antony Haste, RN 03/30/2016, 1:40 PM

## 2016-03-30 NOTE — Progress Notes (Signed)
  Echocardiogram 2D Echocardiogram has been performed.  Tresa Res 03/30/2016, 1:16 PM

## 2016-03-30 NOTE — Progress Notes (Signed)
Heart rate mostly sustaining at 80's to low 100's.

## 2016-03-30 NOTE — Progress Notes (Signed)
ANTICOAGULATION CONSULT NOTE - Follow Up Consult  Pharmacy Consult for Heparin  Indication: atrial fibrillation  Allergies  Allergen Reactions  . Amiodarone Shortness Of Breath    Stopped in Feb 2017 secondary to pulmonary complications  . Benicar [Olmesartan]     Unclear of the intolerance  . Bystolic [Nebivolol Hcl]   . Clonidine Derivatives   . Exforge [Amlodipine Besylate-Valsartan] Other (See Comments)    Unclear  . Hctz [Hydrochlorothiazide]     Currently taking without problem; question if this has to do with hypokalemia  . Hydralazine Other (See Comments)    Also tolerated, but had orthostatic changes on standing medication  . Lisinopril   . Multaq [Dronedarone] Nausea And Vomiting  . Rythmol [Propafenone]   . Statins   . Tekturna [Aliskiren]   . Coreg [Carvedilol] Other (See Comments)    fatigue  . Prednisone     Jittery     Patient Measurements: Height: 5' (152.4 cm) Weight: 138 lb (62.6 kg) IBW/kg (Calculated) : 45.5  Vital Signs: Temp: 98.2 F (36.8 C) (10/11 2144) Temp Source: Oral (10/11 2144) BP: 148/80 (10/11 2144) Pulse Rate: 134 (10/11 2144)  Labs:  Recent Labs  03/29/16 1625 03/29/16 2205 03/30/16 0210  HGB 13.0  --   --   HCT 39.3  --   --   PLT 282  --   --   HEPARINUNFRC  --   --  0.30  CREATININE 0.71  --   --   TROPONINI 0.28* 0.26*  --     Estimated Creatinine Clearance: 40.9 mL/min (by C-G formula based on SCr of 0.71 mg/dL).   Assessment: Heparin for afib, initial heparin level is therapeutic, cardiology still weighing options for long term control (+/- anti-coagulation)  Goal of Therapy:  Heparin level 0.3-0.7 units/ml Monitor platelets by anticoagulation protocol: Yes   Plan:  -Cont heparin 800 units/hr -1200 HL  Talik Casique 03/30/2016,2:53 AM

## 2016-03-30 NOTE — Progress Notes (Signed)
    [facility] has been requested to perform a transesophageal echocardiogram and direct cardioversion on Sara [name] for atrial fibrilation.  After careful review of history and examination, the risks and benefits of transesophageal echocardiogram have been explained to the pt and her grandson including risks of esophageal damage, perforation (1:10,000 risk), bleeding, pharyngeal hematoma as well as other potential complications associated with conscious sedation including aspiration, arrhythmia, respiratory failure and death. Alternatives to treatment were discussed, questions were answered. Patient is willing to proceed.   Kerin Ransom, [place]  03/30/2016 6:35 PM

## 2016-03-30 NOTE — Progress Notes (Signed)
[facility] has been requested to perform a transesophageal echocardiogram on 03/31/16 for atrial fibrillation .  After careful review of history and examination, the risks and benefits of transesophageal echocardiogram have been explained including risks of esophageal damage, perforation (1:10,000 risk), bleeding, pharyngeal hematoma as well as other potential complications associated with conscious sedation including aspiration, arrhythmia, respiratory failure and death. Alternatives to treatment were discussed, questions were answered. Patient is willing to proceed.  TEE - Dr. Marlou Porch @ 14:00 . NPO after midnight. Meds with sips ok  Arbutus Leas, NP 03/30/2016 12:16 PM

## 2016-03-30 NOTE — Progress Notes (Addendum)
Patient Name: Sara [name] Date of Encounter: 03/30/2016  Primary Cardiologist: Dr. Beckie Salts Problem List     Principal Problem:   Atrial fibrillation with rapid ventricular response [facility]) Active Problems:   Atypical syncope   Amiodarone pulmonary toxicity   Demand ischemia (HCC)   Paroxysmal atrial fibrillation (HCC)    Subjective  Feels well, denies chest pain and SOB. She does feel palpitations intermittently.    Inpatient Medications    Scheduled Meds: . anastrozole  1 mg Oral Daily  . apixaban  2.5 mg Oral BID  . aspirin EC  81 mg Oral Daily  . cholecalciferol  1,000 Units Oral Daily  . metoprolol tartrate  12.5 mg Oral Q6H  . rOPINIRole  1 mg Oral Daily   Continuous Infusions: . diltiazem (CARDIZEM) infusion 10 mg/hr (03/30/16 1100)   PRN Meds:.acetaminophen, ALPRAZolam, Influenza vac split quadrivalent PF, ondansetron (ZOFRAN) IV   Vital Signs    Vitals:   03/29/16 2115 03/29/16 2144 03/30/16 0443 03/30/16 1119  BP: 116/76 (!) 148/80 (!) 118/59 140/85  Pulse: (!) 136 (!) 134 (!) 101 96  Resp: 21 20 20    Temp:  98.2 F (36.8 C) 97.6 F (36.4 C)   TempSrc:  Oral Oral   SpO2: 96% 98% 96%   Weight:      Height:        Intake/Output Summary (Last 24 hours) at 03/30/16 1202 Last data filed at 03/30/16 0700  Gross per 24 hour  Intake              250 ml  Output                0 ml  Net              250 ml   Filed Weights   03/29/16 1612  Weight: 62.6 kg (138 lb)    Physical Exam   GEN: Well nourished, well developed, in no acute distress.  HEENT: Grossly normal.  Neck: Supple, no JVD, carotid bruits, or masses. Cardiac: Irregularly irregular rhythm, no murmurs, rubs, or gallops. No clubbing, cyanosis, edema.  Radials/DP/PT 2+ and equal bilaterally.  Respiratory:  Respirations regular and unlabored, clear to auscultation bilaterally. GI: Soft, nontender, nondistended, BS + x 4. MS: no deformity or atrophy. Skin: warm and dry, no  rash. Neuro:  Strength and sensation are intact. Psych: AAOx3.  Normal affect.  Labs    CBC  Recent Labs  03/29/16 1625 03/30/16 0358  WBC 6.1 5.1  HGB 13.0 11.7*  HCT 39.3 36.0  MCV 78.3 78.9  PLT 282 [id number]   Basic Metabolic Panel  Recent Labs  03/29/16 1625 03/30/16 0358  NA 140 142  K 3.8 3.3*  CL 110 113*  CO2 22 23  GLUCOSE 98 92  BUN 18 14  CREATININE 0.71 0.62  CALCIUM 9.7 9.0   Cardiac Enzymes  Recent Labs  03/29/16 2205 03/30/16 0358 03/30/16 1017  TROPONINI 0.26* 0.27* 0.20*    Telemetry    Afib - Personally Reviewed  ECG    Afib, RBBB- Personally Reviewed  Radiology    Dg Chest 2 View  Result Date: 03/29/2016 CLINICAL DATA:  Atrial fibrillation EXAM: CHEST  2 VIEW COMPARISON:  08/23/2010 FINDINGS: Cardiac shadow is mildly enlarged. A hiatal hernia is again seen. Chronic interstitial changes are noted bilaterally without focal confluent infiltrate. Degenerative changes of thoracic spine are noted. IMPRESSION: Chronic changes without acute abnormality. Electronically Signed   By: Elta Guadeloupe  Lukens M.D.   On: 03/29/2016 17:07     Cardiac Studies  Echo pending   Patient Profile     Sara [name] is a 82 year old female with a past medical history of HTN, HLD, PAF. She presented with rapid afib, and was started on diltiazem. She has not been on anti-coagulation due to concerns about falls and syncope.   Assessment & Plan    1. PAF: Presents with rapid Afib, was started on diltiazem gtt, has better rate control but still with rates that are uncontrolled and dynamic in nature. 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 Amio was stopped. She was not on any beta blockers or anything for rate control at home. Will add metoprolol for increased rate control 12.5 mg q 6 hours. Will also add digoxin load.   She was started on a heparin gtt,will need to start anti-coagulation with a NOAC.  She has declined anti-coagulation   This patients  CHA2DS2-VASc Score and unadjusted Ischemic Stroke Rate (% per year) is equal to 4.8 % stroke rate/year from a score of 4 Above score calculated as 1 point each if present [CHF, HTN, DM, Vascular=MI/PAD/Aortic Plaque, Age if 65-74, or Female], 2 points each if present [Age > 75, or Stroke/TIA/TE]  She has awareness of her Afib, and says that she knows that she went into it 4 days ago.   We will consult EP regarding the need for loop recorder, as she has documented episodes of syncope, that are associated with bradycardia. Also would like EP to make recommendations regarding restarting Amiodarone in this patient who had previous BOOP.     2. HTN: Well controlled on current regimen.   3. Elevated troponin: not representative of ACS, no history of ischemic heart disease. Flat trend. Likely demand ischemia  Signed, Arbutus Leas, NP  03/30/2016, 12:02 PM  . I saw evaluated the patient is morning along with Jettie Booze, NP-C. Patient is well known to me and we discussed current findings. I agree with her findings examination as well as recommendations. Plan for additional rate control we will try 1 dose of digoxin but will also add beta blocker for rate control as tolerated by blood pressure. We are holding her Diovan to allow [place] to have blood pressure. She is relatively symptomatic with her A. fib and has not had a recurrence since 2005. This is her first recurrence now that she is now off amiodarone. For long time she was on anticoagulation since the because she is not had recurrences. Amiodarone was stopped because of possible BOOP as a potential toxicity.  She was on beta blocker until this was stopped during admission to [facility] for one of her syncopal/seizure events.  Unfortunately I don't have the reports from that hospitalization, and I'm not exactly sure why the beta blocker was stopped. Most likely related to bradycardia, she mentioned that during a witnessed episode in the hospital  there, she was told that her "heart stopped ". We will need to get the discharge summary plus or minus progress notes etc. from her stay at [facility]. 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 Aspirin with her syncopal/seizure episodes and now back in A. fib RVR, I have asked the EP team to consult for additional advice.  Plan for now however will be to try to rate control her and see if she spontaneously converts. I  would not like to have her on too much rate control agents once she converts. If she does not spontaneous and convert overnight tonight on diltiazem plus a beta blocker with a dose of digoxin, I would consider trying to schedule her for TEE cardioversion note for tomorrow. As a result, she is agreeable to taking a DOAC for 6 weeks. We will start Eliquis today.    Glenetta Hew, M.D., M.S. Interventional Cardiologist   Pager # [phone] Phone # [phone] [address] [place], [place] [address]

## 2016-03-31 ENCOUNTER — Encounter (HOSPITAL_COMMUNITY)
Admission: EM | Disposition: A | Discharge status: Home / Self Care | Payer: Self-pay | Source: Home / Self Care | Attending: Cardiology

## 2016-03-31 ENCOUNTER — Inpatient Hospital Stay (HOSPITAL_COMMUNITY): Payer: Commercial Managed Care - HMO | Admitting: Anesthesiology

## 2016-03-31 ENCOUNTER — Inpatient Hospital Stay (HOSPITAL_COMMUNITY): Payer: Commercial Managed Care - HMO

## 2016-03-31 ENCOUNTER — Encounter (HOSPITAL_COMMUNITY): Payer: Self-pay | Admitting: *Deleted

## 2016-03-31 DIAGNOSIS — I340 Nonrheumatic mitral (valve) insufficiency: Secondary | ICD-10-CM

## 2016-03-31 DIAGNOSIS — I4891 Unspecified atrial fibrillation: Secondary | ICD-10-CM

## 2016-03-31 HISTORY — PX: TEE WITHOUT CARDIOVERSION: SHX5443

## 2016-03-31 HISTORY — PX: CARDIOVERSION: SHX1299

## 2016-03-31 LAB — CBC
HEMATOCRIT: 36.8 % (ref 36.0–46.0)
Hemoglobin: 11.7 g/dL — ABNORMAL LOW (ref 12.0–15.0)
MCH: 25.2 pg — ABNORMAL LOW (ref 26.0–34.0)
MCHC: 31.8 g/dL (ref 30.0–36.0)
MCV: 79.3 fL (ref 78.0–100.0)
PLATELETS: 255 10*3/uL (ref 150–400)
RBC: 4.64 MIL/uL (ref 3.87–5.11)
RDW: 16.3 % — AB (ref 11.5–15.5)
WBC: 5.3 10*3/uL (ref 4.0–10.5)

## 2016-03-31 SURGERY — ECHOCARDIOGRAM, TRANSESOPHAGEAL
Anesthesia: Monitor Anesthesia Care

## 2016-03-31 MED ORDER — DILTIAZEM HCL 30 MG PO TABS
30.0000 mg | ORAL_TABLET | Freq: Four times a day (QID) | ORAL | Status: DC
  Administered 2016-03-31 – 2016-04-01 (×2): 30 mg via ORAL
  Filled 2016-03-31 (×2): qty 1

## 2016-03-31 MED ORDER — PROPOFOL 500 MG/50ML IV EMUL
INTRAVENOUS | Status: DC | PRN
  Administered 2016-03-31: 120 ug/kg/min via INTRAVENOUS

## 2016-03-31 MED ORDER — DILTIAZEM HCL 60 MG PO TABS
60.0000 mg | ORAL_TABLET | Freq: Four times a day (QID) | ORAL | Status: DC
Start: 2016-03-31 — End: 2016-03-31

## 2016-03-31 MED ORDER — ONDANSETRON HCL 4 MG/2ML IJ SOLN: 4.0000 mg | Freq: Once | INTRAMUSCULAR | Status: DC | PRN

## 2016-03-31 MED ORDER — FENTANYL CITRATE (PF) 100 MCG/2ML IJ SOLN: 25.0000 ug | INTRAMUSCULAR | Status: DC | PRN

## 2016-03-31 MED ORDER — PROPOFOL 10 MG/ML IV BOLUS
INTRAVENOUS | Status: DC | PRN
  Administered 2016-03-31: 20 mg via INTRAVENOUS

## 2016-03-31 MED ORDER — BUTAMBEN-TETRACAINE-BENZOCAINE 2-2-14 % EX AERO
INHALATION_SPRAY | CUTANEOUS | Status: DC | PRN
  Administered 2016-03-31: 2 via TOPICAL

## 2016-03-31 NOTE — CV Procedure (Signed)
    Electrical Cardioversion Procedure Note Sara [name] [id number] 1926/05/29  Procedure: Electrical Cardioversion Indications:  Atrial Fibrillation  Time Out: Verified patient identification, verified procedure,medications/allergies/relevent history reviewed, required imaging and test results available.  Performed  Procedure Details  The patient was NPO after midnight. Anesthesia was administered at the beside  by Dr. Zenia Resides with propofol.  Cardioversion was performed with synchronized biphasic defibrillation via AP pads with 120 joules.  1 attempt(s) were performed.  The patient converted to normal sinus rhythm. The patient tolerated the procedure well   IMPRESSION:  Successful cardioversion of atrial fibrillation  TEE: Normal EF No thrombus   Sara [name] 03/31/2016, 2:26 PM

## 2016-03-31 NOTE — Transfer of Care (Signed)
Immediate Anesthesia Transfer of Care Note  Patient: Sara [name]  Procedure(s) Performed: Procedure(s): TRANSESOPHAGEAL ECHOCARDIOGRAM (TEE) (N/A) CARDIOVERSION (N/A)  Patient Location: Endoscopy Unit  Anesthesia Type:MAC  Level of Consciousness: awake, alert , oriented and patient cooperative  Airway & Oxygen Therapy: Patient Spontanous Breathing and Patient connected to nasal cannula oxygen  Post-op Assessment: Report given to RN and Post -op Vital signs reviewed and stable  Post vital signs: Reviewed and stable  Last Vitals:  Vitals:   03/31/16 0409 03/31/16 1331  BP: (!) 128/48 (!) 132/117  Pulse: 73 83  Resp:  19  Temp: 36.7 C 36.8 C    Last Pain:  Vitals:   03/31/16 1331  TempSrc: Oral  PainSc:          Complications: No apparent anesthesia complications

## 2016-03-31 NOTE — Anesthesia Preprocedure Evaluation (Addendum)
Anesthesia Evaluation  Patient identified by MRN, date of birth, ID band Patient awake    Reviewed: Allergy & Precautions, NPO status , Patient's Chart, lab work & pertinent test results, reviewed documented beta blocker date and time   History of Anesthesia Complications Negative for: history of anesthetic complications  Airway Mallampati: III  TM Distance: >3 FB Neck ROM: Full    Dental  (+) Dental Advisory Given   Pulmonary neg shortness of breath, neg sleep apnea, neg COPD, neg recent URI,  BOOP   Pulmonary exam normal breath sounds clear to auscultation       Cardiovascular hypertension, Pt. on home beta blockers and Pt. on medications (-) angina(-) Past MI, (-) Cardiac Stents and (-) CABG + dysrhythmias Atrial Fibrillation  Rhythm:Irregular Rate:Normal  HLD  Echo 03/30/16 Study Conclusions  - Left ventricle: The cavity size was normal. Wall thickness was increased in a pattern of mild LVH. Systolic function was vigorous. The estimated ejection fraction was in the range of 65% to 70%. Wall motion was normal; there were no regional wall motion abnormalities. Doppler parameters are consistent with high ventricular filling pressure. - Aortic valve: There was trivial regurgitation. - Mitral valve: Calcified annulus. Mildly thickened leaflets . - Left atrium: The atrium was severely dilated. - Right atrium: The atrium was mildly dilated. - Pulmonary arteries: Systolic pressure was mildly increased. PA peak pressure: 37 mm Hg (S).  Impressions:  - Vigorous LV systolic function with intracavitary gradient of 1.7 m/s; mild LVH; elevated LV filling pressure; biatrial enlargement; trace AI; mild TR with mildly elevated pulmonary pressure.   Neuro/Psych neg Seizures (questionable seizures) PSYCHIATRIC DISORDERS Anxiety    GI/Hepatic Neg liver ROS, hiatal hernia, GERD  ,  Endo/Other  negative  endocrine ROS  Renal/GU negative Renal ROS     Musculoskeletal  (+) Arthritis , Osteoarthritis,    Abdominal   Peds  Hematology  (+) Blood dyscrasia, anemia ,   Anesthesia Other Findings Right eye corneal edema, vortex keratopathy, syncope  Reproductive/Obstetrics                            Anesthesia Physical Anesthesia Plan  ASA: III  Anesthesia Plan: MAC   Post-op Pain Management:    Induction: Intravenous  Airway Management Planned: Natural Airway and Nasal Cannula  Additional Equipment:   Intra-op Plan:   Post-operative Plan:   Informed Consent: I have reviewed the patients History and Physical, chart, labs and discussed the procedure including the risks, benefits and alternatives for the proposed anesthesia with the patient or authorized representative who has indicated his/her understanding and acceptance.   Dental advisory given  Plan Discussed with: CRNA  Anesthesia Plan Comments:        Anesthesia Quick Evaluation

## 2016-03-31 NOTE — H&P (View-Only) (Signed)
Patient Name: Sara [name] Date of Encounter: 03/31/2016  Primary Cardiologist: Dr. Beckie Salts Problem List     Principal Problem:   Atrial fibrillation with rapid ventricular response [facility]) Active Problems:   Amiodarone pulmonary toxicity   Atypical syncope   Paroxysmal atrial fibrillation (HCC)   Demand ischemia (HCC)    Subjective   Feels well this am, no SOB or palpitations.  Has questions and concerns about cardioversion and/or loop recorder.  Inpatient Medications    Scheduled Meds: . anastrozole  1 mg Oral Daily  . apixaban  2.5 mg Oral BID  . aspirin EC  81 mg Oral Daily  . cholecalciferol  1,000 Units Oral Daily  . metoprolol tartrate  12.5 mg Oral Q6H  . rOPINIRole  1 mg Oral Daily   Continuous Infusions: . sodium chloride    . diltiazem (CARDIZEM) infusion 10 mg/hr (03/31/16 0555)   PRN Meds: acetaminophen, ALPRAZolam, Influenza vac split quadrivalent PF, ondansetron (ZOFRAN) IV   Vital Signs    Vitals:   03/30/16 1400 03/30/16 1938 03/30/16 2237 03/31/16 0409  BP: (!) 128/45 (!) 124/55 (!) 128/58 (!) 128/48  Pulse: 62 65 75 73  Resp: 18 18    Temp: 97.7 F (36.5 C) 98.1 F (36.7 C)  98 F (36.7 C)  TempSrc: Oral Oral  Oral  SpO2: 98% 97%  98%  Weight:      Height:        Intake/Output Summary (Last 24 hours) at 03/31/16 0959 Last data filed at 03/31/16 [id number]  Gross per 24 hour  Intake           436.83 ml  Output                0 ml  Net           436.83 ml   Filed Weights   03/29/16 1612  Weight: 138 lb (62.6 kg)    Physical Exam   GEN: Well nourished, well developed, elderly female in no acute distress.  HEENT: Grossly normal.  Neck: Supple, no JVD, carotid bruits, or masses. Cardiac: Irregularly irregular, no murmurs, rubs, or gallops. No clubbing, cyanosis, edema.  Radials/DP/PT 2+ and equal bilaterally.  Respiratory:  Respirations regular and unlabored, clear to auscultation bilaterally. GI: Soft, nontender,  nondistended, BS + x 4. MS: no deformity or atrophy. Skin: warm and dry, no rash. Neuro:  Strength and sensation are intact. Psych: AAOx3.  Normal affect.  Labs    CBC  Recent Labs  03/30/16 0358 03/31/16 0418  WBC 5.1 5.3  HGB 11.7* 11.7*  HCT 36.0 36.8  MCV 78.9 79.3  PLT 242 [id number]   Basic Metabolic Panel  Recent Labs  03/29/16 1625 03/30/16 0358  NA 140 142  K 3.8 3.3*  CL 110 113*  CO2 22 23  GLUCOSE 98 92  BUN 18 14  CREATININE 0.71 0.62  CALCIUM 9.7 9.0   Cardiac Enzymes  Recent Labs  03/29/16 2205 03/30/16 0358 03/30/16 1017  TROPONINI 0.26* 0.27* 0.20*    Telemetry    Atrial fib - Personally Reviewed  ECG    Atrial fibrillation- Personally Reviewed  Radiology    Dg Chest 2 View  Result Date: 03/29/2016 CLINICAL DATA:  Atrial fibrillation EXAM: CHEST  2 VIEW COMPARISON:  08/23/2010 FINDINGS: Cardiac shadow is mildly enlarged. A hiatal hernia is again seen. Chronic interstitial changes are noted bilaterally without focal confluent infiltrate. Degenerative changes of thoracic spine are noted. IMPRESSION: Chronic  changes without acute abnormality. Electronically Signed   By: Inez Catalina M.D.   On: 03/29/2016 17:07    Cardiac Studies  Echo 03/30/16 Study Conclusions  - Left ventricle: The cavity size was normal. Wall thickness was   increased in a pattern of mild LVH. Systolic function was   vigorous. The estimated ejection fraction was in the range of 65%   to 70%. Wall motion was normal; there were no regional wall   motion abnormalities. Doppler parameters are consistent with high   ventricular filling pressure. - Aortic valve: There was trivial regurgitation. - Mitral valve: Calcified annulus. Mildly thickened leaflets . - Left atrium: The atrium was severely dilated. - Right atrium: The atrium was mildly dilated. - Pulmonary arteries: Systolic pressure was mildly increased. PA   peak pressure: 37 mm Hg (S).  Impressions:  -  Vigorous LV systolic function with intracavitary gradient of 1.7   m/s; mild LVH; elevated LV filling pressure; biatrial   enlargement; trace AI; mild TR with mildly elevated pulmonary   pressure.   Patient Profile  Ms. Stracener is a 82 year old female with a past medical history of HTN, HLD, PAF. She presented with rapid afib, and was started on diltiazem. She has not been on anti-coagulation due to concerns about falls and syncope.      Assessment & Plan    1. PAF: Presents with rapid Afib, was started on diltiazem gtt, has better rate control but still with rates that are uncontrolled and dynamic in nature. 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 Amio was stopped. She was not on any beta blockers  for rate control at home. Metoprolol and digoxin added yesterday, rate controlled today, remains on diltiazem gtt. For TEE/DCCV today.   This patients CHA2DS2-VASc Score and unadjusted Ischemic Stroke Rate (% per year) is equal to 4.8 % stroke rate/year from a score of 4 Above score calculated as 1 point each if present [CHF, HTN, DM, Vascular=MI/PAD/Aortic Plaque, Age if 65-74, or Female], 2 points each if present [Age > 75, or Stroke/TIA/TE] Continue Eliquis.   She has awareness of her Afib, and says that she knows that she went into it 4 days ago.   EP has seen and thinks that she would benefit from Loop recorder, however she is hesitant because she has financial concerns.     2. HTN: Well controlled on current regimen.   3. Elevated troponin: not representative of ACS, no history of ischemic heart disease. Flat trend. Likely demand ischemia   Signed, Arbutus Leas, NP  03/31/2016, 9:59 AM   I have seen, examined and evaluated the patient this AM along with Jettie Booze, NP.  After reviewing all the available data and chart, we discussed the patients laboratory, study & physical findings as well as symptoms in detail. I agree with her findings, examination as well as  impression recommendations as per our discussion.    Essentially asymptomatic atrial fibrillation after a long time being out of A. fib on amiodarone. Her rate is much improved on calcium channel blocker. I like to convert to oral diltiazem following her cardioversion. We'll stop diltiazem drip roughly 1 hour prior to her cardioversion and then determine the dose of diltiazem based on how slow she is going post cardioversion.  However based on how symptomatic she was I would like to see her out of A. fib. Plan is to proceed with TEE guided cardioversion today and at least 4-6 weeks  of oral anticoagulation at home.  -We can see what her size and recovery time is after cardioversion to determine if potentially her "passout spells "are related to postconversion pause from A. fib to sinus rhythm. Next I know suspended about 30 minutes talking the patient total with half of it being about the cardioversion, and the other half being about the discussion of possible loop recorder. I think especially with [place] cardioverted her and placing her back on rate control agent, I would like to make sure that we are not having bradycardia spells from tachybradycardia. Also would like to ensure that she is staying in sinus rhythm. The loop recorder will help [place] with this as well as potentially determine the etiology for her "passout spells/seizures."  Mild troponin elevation with no active chest pain now. I agree that we do not need an ischemic evaluation.    Glenetta Hew, M.D., M.S. Interventional Cardiologist   Pager # [phone] Phone # [phone] [address] [place], [place] [address]

## 2016-03-31 NOTE — Progress Notes (Signed)
    Discussed with RN.  Patient is somewhat bradycardic following cardioversion. I was little bit concerned about this, therefore we had held IV diltiazem prior to the procedure. We will continue to hold for a couple hours and then restart at 30 mg every 6 hours overnight and monitor her for signs of bradycardia.  Otherwise, if she is stable, could consider discharge tomorrow with plan to follow-up with electrophysiology to finalize decision about doing her loop recorder.   Glenetta Hew, M.D., M.S. Interventional Cardiologist   Pager # [phone] Phone # [phone] [address]. [place], [place] [address]

## 2016-03-31 NOTE — Progress Notes (Signed)
  Echocardiogram Echocardiogram Transesophageal has been performed.  Sara [name] 03/31/2016, 2:38 PM

## 2016-03-31 NOTE — Anesthesia Procedure Notes (Signed)
Procedure Name: MAC Date/Time: 03/31/2016 2:01 PM Performed by: Salli Quarry Jemaine Prokop Pre-anesthesia Checklist: Patient identified, Emergency Drugs available, Suction available and Patient being monitored Oxygen Delivery Method: Nasal cannula

## 2016-03-31 NOTE — Interval H&P Note (Signed)
History and Physical Interval Note:  03/31/2016 1:50 PM  Sara [name]  has presented today for surgery, with the diagnosis of afib  The various methods of treatment have been discussed with the patient and family. After consideration of risks, benefits and other options for treatment, the patient has consented to  Procedure(s): TRANSESOPHAGEAL ECHOCARDIOGRAM (TEE) (N/A) CARDIOVERSION (N/A) as a surgical intervention .  The patient's history has been reviewed, patient examined, no change in status, stable for surgery.  I have reviewed the patient's chart and labs.  Questions were answered to the patient's satisfaction.     [employer]

## 2016-03-31 NOTE — Anesthesia Postprocedure Evaluation (Signed)
Anesthesia Post Note  Patient: [name]  Procedure(s) Performed: Procedure(s) (LRB): TRANSESOPHAGEAL ECHOCARDIOGRAM (TEE) (N/A) CARDIOVERSION (N/A)  Patient location during evaluation: PACU Anesthesia Type: MAC Level of consciousness: awake and alert Pain management: pain level controlled Vital Signs Assessment: post-procedure vital signs reviewed and stable Respiratory status: spontaneous breathing, nonlabored ventilation and respiratory function stable Cardiovascular status: stable and blood pressure returned to baseline Anesthetic complications: no    Last Vitals:  Vitals:   03/31/16 1430 03/31/16 1440  BP: (!) 88/43 132/68  Pulse: (!) 44 (!) 47  Resp:  18  Temp:      Last Pain:  Vitals:   03/31/16 1429  TempSrc: Oral  PainSc:                  Nilda Simmer

## 2016-03-31 NOTE — Consult Note (Signed)
            [facility] CM Primary Care Navigator  03/31/2016  Danica Pedreira 1926-04-28 [id number]  Patient seen at the bedside to identify discharge needs. Patient shared that her "heart was racing" and having palpitations which led to this admission.  Patient confirms that primary care provider is Dr. Vassie Loll Eyk with [facility] in [place]. She verbalized being able to drive prior to admission. Son Patrick [place]) will be able to transport her to doctors' appointments if needed as stated.  Patient states using [facility] pharmacy in [place] to obtain medications with no problems or issues of affordability. She mentions doing her own medication management at home using "pill box" system weekly. Patient shares being independent with self care although her son lives with her. She states that son will be able to assist with her care needs as needed.  Patient verbalized being aware of [employer] transport benefits and home medication delivery.  She had expressed understanding to call primary care provider's office for a post discharge follow-up appointment within a week or sooner if needs arise, once she gets discharged.  Patient letter provided as a reminder.  Patient denies any further needs at this time..  For questions, please contact:  Eitan Doubleday A. Brittyn Salaz, BSN, RN-BC [facility] PRIMARY CARE Navigator Cell: [phone]

## 2016-03-31 NOTE — Progress Notes (Addendum)
Patient Name: Sara [name] Date of Encounter: 03/31/2016  Primary Cardiologist: Dr. Beckie [name] Problem List     Principal Problem:   Atrial fibrillation with rapid ventricular response [facility]) Active Problems:   Amiodarone pulmonary toxicity   Atypical syncope   Paroxysmal atrial fibrillation (HCC)   Demand ischemia (HCC)    Subjective   Feels well this am, no SOB or palpitations.  Has questions and concerns about cardioversion and/or loop recorder.  Inpatient Medications    Scheduled Meds: . anastrozole  1 mg Oral Daily  . apixaban  2.5 mg Oral BID  . aspirin EC  81 mg Oral Daily  . cholecalciferol  1,000 Units Oral Daily  . metoprolol tartrate  12.5 mg Oral Q6H  . rOPINIRole  1 mg Oral Daily   Continuous Infusions: . sodium chloride    . diltiazem (CARDIZEM) infusion 10 mg/hr (03/31/16 0555)   PRN Meds: acetaminophen, ALPRAZolam, Influenza vac split quadrivalent PF, ondansetron (ZOFRAN) IV   Vital Signs    Vitals:   03/30/16 1400 03/30/16 1938 03/30/16 2237 03/31/16 0409  BP: (!) 128/45 (!) 124/55 (!) 128/58 (!) 128/48  Pulse: 62 65 75 73  Resp: 18 18    Temp: 97.7 F (36.5 C) 98.1 F (36.7 C)  98 F (36.7 C)  TempSrc: Oral Oral  Oral  SpO2: 98% 97%  98%  Weight:      Height:        Intake/Output Summary (Last 24 hours) at 03/31/16 0959 Last data filed at 03/31/16 [id number]  Gross per 24 hour  Intake           436.83 ml  Output                0 ml  Net           436.83 ml   Filed Weights   03/29/16 1612  Weight: 138 lb (62.6 kg)    Physical Exam   GEN: Well nourished, well developed, elderly female in no acute distress.  HEENT: Grossly normal.  Neck: Supple, no JVD, carotid bruits, or masses. Cardiac: Irregularly irregular, no murmurs, rubs, or gallops. No clubbing, cyanosis, edema.  Radials/DP/PT 2+ and equal bilaterally.  Respiratory:  Respirations regular and unlabored, clear to auscultation bilaterally. GI: Soft, nontender,  nondistended, BS + x 4. MS: no deformity or atrophy. Skin: warm and dry, no rash. Neuro:  Strength and sensation are intact. Psych: AAOx3.  Normal affect.  Labs    CBC  Recent Labs  03/30/16 0358 03/31/16 0418  WBC 5.1 5.3  HGB 11.7* 11.7*  HCT 36.0 36.8  MCV 78.9 79.3  PLT 242 [id number]   Basic Metabolic Panel  Recent Labs  03/29/16 1625 03/30/16 0358  NA 140 142  K 3.8 3.3*  CL 110 113*  CO2 22 23  GLUCOSE 98 92  BUN 18 14  CREATININE 0.71 0.62  CALCIUM 9.7 9.0   Cardiac Enzymes  Recent Labs  03/29/16 2205 03/30/16 0358 03/30/16 1017  TROPONINI 0.26* 0.27* 0.20*    Telemetry    Atrial fib - Personally Reviewed  ECG    Atrial fibrillation- Personally Reviewed  Radiology    Dg Chest 2 View  Result Date: 03/29/2016 CLINICAL DATA:  Atrial fibrillation EXAM: CHEST  2 VIEW COMPARISON:  08/23/2010 FINDINGS: Cardiac shadow is mildly enlarged. A hiatal hernia is again seen. Chronic interstitial changes are noted bilaterally without focal confluent infiltrate. Degenerative changes of thoracic spine are noted. IMPRESSION: Chronic  changes without acute abnormality. Electronically Signed   By: Inez Catalina M.D.   On: 03/29/2016 17:07    Cardiac Studies  Echo 03/30/16 Study Conclusions  - Left ventricle: The cavity size was normal. Wall thickness was   increased in a pattern of mild LVH. Systolic function was   vigorous. The estimated ejection fraction was in the range of 65%   to 70%. Wall motion was normal; there were no regional wall   motion abnormalities. Doppler parameters are consistent with high   ventricular filling pressure. - Aortic valve: There was trivial regurgitation. - Mitral valve: Calcified annulus. Mildly thickened leaflets . - Left atrium: The atrium was severely dilated. - Right atrium: The atrium was mildly dilated. - Pulmonary arteries: Systolic pressure was mildly increased. PA   peak pressure: 37 mm Hg (S).  Impressions:  -  Vigorous LV systolic function with intracavitary gradient of 1.7   m/s; mild LVH; elevated LV filling pressure; biatrial   enlargement; trace AI; mild TR with mildly elevated pulmonary   pressure.   Patient Profile  Sara [name] is a 82 year old female with a past medical history of HTN, HLD, PAF. She presented with rapid afib, and was started on diltiazem. She has not been on anti-coagulation due to concerns about falls and syncope.      Assessment & Plan    1. PAF: Presents with rapid Afib, was started on diltiazem gtt, has better rate control but still with rates that are uncontrolled and dynamic in nature. 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 Amio was stopped. She was not on any beta blockers  for rate control at home. Metoprolol and digoxin added yesterday, rate controlled today, remains on diltiazem gtt. For TEE/DCCV today.   This patients CHA2DS2-VASc Score and unadjusted Ischemic Stroke Rate (% per year) is equal to 4.8 % stroke rate/year from a score of 4 Above score calculated as 1 point each if present [CHF, HTN, DM, Vascular=MI/PAD/Aortic Plaque, Age if 65-74, or Female], 2 points each if present [Age > 75, or Stroke/TIA/TE] Continue Eliquis.   She has awareness of her Afib, and says that she knows that she went into it 4 days ago.   EP has seen and thinks that she would benefit from Loop recorder, however she is hesitant because she has financial concerns.     2. HTN: Well controlled on current regimen.   3. Elevated troponin: not representative of ACS, no history of ischemic heart disease. Flat trend. Likely demand ischemia  Potassium yesterday was 3.3 suggestive of hypokalemia. Was given oral supplementation.   Signed, Arbutus Leas, NP  03/31/2016, 9:59 AM   I have seen, examined and evaluated the patient this AM along with Jettie Booze, NP.  After reviewing all the available data and chart, we discussed the patients laboratory, study & physical  findings as well as symptoms in detail. I agree with her findings, examination as well as impression recommendations as per our discussion.    Essentially asymptomatic atrial fibrillation after a long time being out of A. fib on amiodarone. Her rate is much improved on calcium channel blocker. I like to convert to oral diltiazem following her cardioversion. We'll stop diltiazem drip roughly 1 hour prior to her cardioversion and then determine the dose of diltiazem based on how slow she is going post cardioversion.  However based on how symptomatic she was I would like to see her out of A. fib. Plan is  to proceed with TEE guided cardioversion today and at least 4-6 weeks of oral anticoagulation at home.  -We can see what her size and recovery time is after cardioversion to determine if potentially her "passout spells "are related to postconversion pause from A. fib to sinus rhythm. Next I know suspended about 30 minutes talking the patient total with half of it being about the cardioversion, and the other half being about the discussion of possible loop recorder. I think especially with [place] cardioverted her and placing her back on rate control agent, I would like to make sure that we are not having bradycardia spells from tachybradycardia. Also would like to ensure that she is staying in sinus rhythm. The loop recorder will help [place] with this as well as potentially determine the etiology for her "passout spells/seizures."  Mild troponin elevation with no active chest pain now. I agree that we do not need an ischemic evaluation.    Glenetta Hew, M.D., M.S. Interventional Cardiologist   Pager # [phone] Phone # [phone] [address] [place], [place] [address]

## 2016-04-01 LAB — CBC
HEMATOCRIT: 35.1 % — AB (ref 36.0–46.0)
Hemoglobin: 11.3 g/dL — ABNORMAL LOW (ref 12.0–15.0)
MCH: 25.5 pg — ABNORMAL LOW (ref 26.0–34.0)
MCHC: 32.2 g/dL (ref 30.0–36.0)
MCV: 79.1 fL (ref 78.0–100.0)
PLATELETS: 250 10*3/uL (ref 150–400)
RBC: 4.44 MIL/uL (ref 3.87–5.11)
RDW: 15.8 % — AB (ref 11.5–15.5)
WBC: 5.4 10*3/uL (ref 4.0–10.5)

## 2016-04-01 MED ORDER — METOPROLOL TARTRATE 25 MG PO TABS: 25.0000 mg | ORAL_TABLET | Freq: Two times a day (BID) | ORAL | 6 refills | Status: DC

## 2016-04-01 MED ORDER — DILTIAZEM HCL ER COATED BEADS 120 MG PO CP24: 120.0000 mg | ORAL_CAPSULE | Freq: Every day | ORAL | 6 refills | Status: DC

## 2016-04-01 MED ORDER — APIXABAN 2.5 MG PO TABS: 2.5000 mg | ORAL_TABLET | Freq: Two times a day (BID) | ORAL | 2 refills | Status: DC

## 2016-04-01 MED ORDER — DILTIAZEM HCL ER COATED BEADS 120 MG PO CP24
120.0000 mg | ORAL_CAPSULE | Freq: Every day | ORAL | Status: DC
  Administered 2016-04-01: 120 mg via ORAL
  Filled 2016-04-01: qty 1

## 2016-04-01 NOTE — Progress Notes (Signed)
TELEMETRY: Reviewed telemetry pt in NSR rate 45-65: Vitals:   03/31/16 1440 03/31/16 2012 04/01/16 0458 04/01/16 0701  BP: 132/68 (!) 161/50 (!) 142/42 (!) 157/58  Pulse: (!) 47 64 60 (!) 55  Resp: 18 18 18    Temp:  98.3 F (36.8 C) 98 F (36.7 C)   TempSrc:  Oral Oral   SpO2: 100% 96% 94%   Weight:      Height:        Intake/Output Summary (Last 24 hours) at 04/01/16 0737 Last data filed at 03/31/16 1427  Gross per 24 hour  Intake              300 ml  Output                0 ml  Net              300 ml   Filed Weights   03/29/16 1612  Weight: 138 lb (62.6 kg)    Subjective  Feels well this am. C/o restless legs last night but this is a chronic problem. No dyspnea, CP, or syncope.  . anastrozole  1 mg Oral Daily  . apixaban  2.5 mg Oral BID  . cholecalciferol  1,000 Units Oral Daily  . diltiazem  30 mg Oral Q6H  . metoprolol tartrate  12.5 mg Oral Q6H  . rOPINIRole  1 mg Oral Daily      LABS: Basic Metabolic Panel:  Recent Labs  03/29/16 1625 03/30/16 0358  NA 140 142  K 3.8 3.3*  CL 110 113*  CO2 22 23  GLUCOSE 98 92  BUN 18 14  CREATININE 0.71 0.62  CALCIUM 9.7 9.0   Liver Function Tests: No results for input(s): AST, ALT, ALKPHOS, BILITOT, PROT, ALBUMIN in the last 72 hours. No results for input(s): LIPASE, AMYLASE in the last 72 hours. CBC:  Recent Labs  03/31/16 0418 04/01/16 0256  WBC 5.3 5.4  HGB 11.7* 11.3*  HCT 36.8 35.1*  MCV 79.3 79.1  PLT 255 250   Cardiac Enzymes:  Recent Labs  03/29/16 2205 03/30/16 0358 03/30/16 1017  TROPONINI 0.26* 0.27* 0.20*   BNP: No results for input(s): PROBNP in the last 72 hours. D-Dimer: No results for input(s): DDIMER in the last 72 hours. Hemoglobin A1C: No results for input(s): HGBA1C in the last 72 hours. Fasting Lipid Panel: No results for input(s): CHOL, HDL, LDLCALC, TRIG, CHOLHDL, LDLDIRECT in the last 72 hours. Thyroid Function Tests: No results for input(s): TSH, T4TOTAL,  T3FREE, THYROIDAB in the last 72 hours.  Invalid input(s): FREET3   Radiology/Studies:  No results found.  PHYSICAL EXAM General: Well developed, well nourished, in no acute distress. Head: Normocephalic, atraumatic, sclera non-icteric, oropharynx is clear Neck: Negative for carotid bruits. JVD not elevated. No adenopathy Lungs: Clear bilaterally to auscultation without wheezes, rales, or rhonchi. Breathing is unlabored. Heart: RRR S1 S2 without murmurs, rubs, or gallops.  Abdomen: Soft, non-tender, non-distended with normoactive bowel sounds. No hepatomegaly. No rebound/guarding. No obvious abdominal masses. Msk:  Strength and tone appears normal for age. Extremities: No clubbing, cyanosis or edema.  Distal pedal pulses are 2+ and equal bilaterally. Neuro: Alert and oriented X 3. Moves all extremities spontaneously. Psych:  Responds to questions appropriately with a normal affect.  ASSESSMENT AND PLAN: 1. PAF. S/p TEE guided DCCV yesterday. Now sinus brady. No pauses. 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 Continue metoprolol. Switch diltiazem to long acting formulation 120 mg daily.  Needs to stay on Eliquis at least 6 weeks.  2. History of syncope. Unclear if this is related to brady or pauses. EP has seen. Recommendation for loop recorder. Plan for patient to follow up with EP to discuss further as outptient.  3. HTN controlled. 4. Elevated troponin secondary to demand ischemia with Afib. Echo shows normal LV function.  Plan to DC home today with above recommendations.  Present on Admission: . Atrial fibrillation with rapid ventricular response ([place]) . Paroxysmal atrial fibrillation (HCC) . Atypical syncope . Demand ischemia [facility])   Signed, Keerat Denicola Martinique, [place] 04/01/2016 7:37 AM

## 2016-04-01 NOTE — Discharge Summary (Signed)
Discharge Summary    Patient ID: Sara [name],  MRN: [id number], DOB/AGE: 80/16/1927 80 y.o.  Admit date: 03/29/2016 Discharge date: 04/01/2016  Primary Care Provider: Townsend Roger Primary Cardiologist: Dr. Ellyn Hack  Discharge Diagnoses    Principal Problem:   Atrial fibrillation with rapid ventricular response [facility]) Active Problems:   Amiodarone pulmonary toxicity   Atypical syncope   Paroxysmal atrial fibrillation (HCC)   Demand ischemia (HCC)   Allergies Allergies  Allergen Reactions  . Amiodarone Shortness Of Breath    Stopped in Feb 2017 secondary to pulmonary complications  . Benicar [Olmesartan]     Unclear of the intolerance  . Bystolic [Nebivolol Hcl]   . Clonidine Derivatives   . Exforge [Amlodipine Besylate-Valsartan] Other (See Comments)    Unclear  . Hctz [Hydrochlorothiazide]     Currently taking without problem; question if this has to do with hypokalemia  . Hydralazine Other (See Comments)    Also tolerated, but had orthostatic changes on standing medication  . Lisinopril   . Multaq [Dronedarone] Nausea And Vomiting  . Rythmol [Propafenone]   . Statins   . Tekturna [Aliskiren]   . Coreg [Carvedilol] Other (See Comments)    fatigue  . Prednisone     Jittery     Diagnostic Studies/Procedures    TEE/DCCV: 10/13  Study Conclusions  - Left ventricle: Systolic function was normal. The estimated   ejection fraction was in the range of 55% to 60%. - Aortic valve: No evidence of vegetation. - Mitral valve: No evidence of vegetation. There was mild   regurgitation. - Left atrium: The atrium was mildly dilated. No evidence of   thrombus in the appendage. No evidence of thrombus in the   appendage. - Right atrium: No evidence of thrombus in the atrial cavity or   appendage. - Tricuspid valve: No evidence of vegetation. There was mild   regurgitation. - Pulmonic valve: No evidence of vegetation. - Superior vena cava: The study excluded  a thrombus. _____________   History of Present Illness     [name] is an 82 y.o.female recently seen by Dr. Ellyn Hack in the office on October 6. 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 She has not been anticoagulated recently, chart indicating falling spells and syncope, felt to be overall poor candidate.  She presented to the ED describing a feeling of palpitations and shortness of breath since 10/9. She has had no chest pain. Reportedly saw PCP earlier the day of admission and was told to come to ER for further assessment based on elevated heart rate. Heart rate in the 150s to 160s at presentation with ECG showing rapid atrial fibrillation with right bundle branch block, rule out old lateral infarct pattern, nonspecific ST changes. She was placed on intravenous diltiazem by ER staff with heart rate coming down to the 120s.  Labwork showed abnormal troponin I of 0.28 and BNP is elevated at 699. Chest x-ray shows chronic interstitial changes without infiltrates or obvious edema.  She has multiple drug allergies/intolerances as detailed below including prior antiarrhythmics - amiodarone, Rythmol, and Multaq.  Hospital Course     Consultants: EP  Attempts were made to help further control her rate with the addition of metoprolol and digoxin the following day. She was initially started on heparin for anticoagulation, but changed to Eliquis. Initially was not on anticoagulation outside the hospital as per her decision, but was agreeable to  starting anticoagulation for 6 weeks. She also reported episodes of syncope prior to this admission.  She was evaluated by EP this admission who felt she would benefit from [facility] monitor in the outpatient setting given her reports of syncope, and concern of tachy-brady syndrome.   Her rate improved but she did not convert on medical therapy, so plans where made to proceed  with TEE/DCCV the following day. Labs were stable this admission with a trop noted around 0.2 with a flat non-diagnostic trend likely related to demand ischemia. She underwent successful TEE/DCCV with Dr. Marlou Porch on 10/13 with conversion to SR and no thrombus noted with normal EF. She did become slightly bradycardiac post procedure, with her Cardizem held a few hours and resumed at 30mg  q6hr afterwards.   On 10/14 telemetry noted sinus bradycardia, no further episodes of atrial fib. Cardizem was transitioned to 120mg  daily with metoprolol continued. Plan to stay on Eliquis for the next 6 weeks. Will change her metoprolol from 12.5 q6hr to 25mg  BID. Her HCTZ and Diovan were held at discharge.   She was seen and assessed by Dr. Martinique on 10/14 and determined stable for discharge home. I have sent a staff message to arrange for follow up appt with APP in the office and to arrange follow up with EP regarding loop recorder placement.  _____________  Discharge Vitals Blood pressure (!) 157/58, pulse (!) 55, temperature 98 F (36.7 C), temperature source Oral, resp. rate 18, height 5' (1.524 m), weight 138 lb (62.6 kg), SpO2 94 %.  Filed Weights   03/29/16 1612  Weight: 138 lb (62.6 kg)    Labs & Radiologic Studies    CBC  Recent Labs  03/31/16 0418 04/01/16 0256  WBC 5.3 5.4  HGB 11.7* 11.3*  HCT 36.8 35.1*  MCV 79.3 79.1  PLT 255 [id number]   Basic Metabolic Panel  Recent Labs  03/29/16 1625 03/30/16 0358  NA 140 142  K 3.8 3.3*  CL 110 113*  CO2 22 23  GLUCOSE 98 92  BUN 18 14  CREATININE 0.71 0.62  CALCIUM 9.7 9.0   Liver Function Tests No results for input(s): AST, ALT, ALKPHOS, BILITOT, PROT, ALBUMIN in the last 72 hours. No results for input(s): LIPASE, AMYLASE in the last 72 hours. Cardiac Enzymes  Recent Labs  03/29/16 2205 03/30/16 0358 03/30/16 1017  TROPONINI 0.26* 0.27* 0.20*   BNP Invalid input(s): POCBNP D-Dimer No results for input(s): DDIMER in the last 72  hours. Hemoglobin A1C No results for input(s): HGBA1C in the last 72 hours. Fasting Lipid Panel No results for input(s): CHOL, HDL, LDLCALC, TRIG, CHOLHDL, LDLDIRECT in the last 72 hours. Thyroid Function Tests No results for input(s): TSH, T4TOTAL, T3FREE, THYROIDAB in the last 72 hours.  Invalid input(s): FREET3 _____________  Dg Chest 2 View  Result Date: 03/29/2016 CLINICAL DATA:  Atrial fibrillation EXAM: CHEST  2 VIEW COMPARISON:  08/23/2010 FINDINGS: Cardiac shadow is mildly enlarged. A hiatal hernia is again seen. Chronic interstitial changes are noted bilaterally without focal confluent infiltrate. Degenerative changes of thoracic spine are noted. IMPRESSION: Chronic changes without acute abnormality. Electronically Signed   By: Inez Catalina M.D.   On: 03/29/2016 17:07   Disposition   Pt is being discharged home today in good condition.  Follow-up Plans & Appointments    Follow-up Information    Glenetta Hew, MD .   Specialty:  Cardiology Why:  The office will call you with a follow up appt in 2-3 days. Please  give [place] a call if you have not recieved a call in that time frame.  Contact information: [address] [place] [address] [phone]        Will Meredith Leeds, MD .   Specialty:  Cardiology Why:  The office will call you about a follow up appt regarding the discussion of a loop recorder.  Contact information: [address] [place] [address] [phone]          Discharge Instructions    Diet - low sodium heart healthy    Complete by:  As directed    Increase activity slowly    Complete by:  As directed       Discharge Medications   Current Discharge Medication List    START taking these medications   Details  apixaban (ELIQUIS) 2.5 MG TABS tablet Take 1 tablet (2.5 mg total) by mouth 2 (two) times daily. Qty: 60 tablet, Refills: 2    diltiazem (CARDIZEM CD) 120 MG 24 hr capsule Take 1 capsule (120 mg  total) by mouth daily. Qty: 30 capsule, Refills: 6    metoprolol tartrate (LOPRESSOR) 25 MG tablet Take 1 tablet (25 mg total) by mouth 2 (two) times daily. Qty: 60 tablet, Refills: 6      CONTINUE these medications which have NOT CHANGED   Details  anastrozole (ARIMIDEX) 1 MG tablet Take 1 mg by mouth daily.    Cholecalciferol (VITAMIN D3) 1000 units CAPS Take 1 tablet by mouth daily. Refills: 3    rOPINIRole (REQUIP) 1 MG tablet Take 1 tablet by mouth daily. Refills: 3    acetaminophen (TYLENOL) 500 MG tablet Take 500 mg by mouth daily as needed.      STOP taking these medications     aspirin 81 MG tablet      hydrochlorothiazide (HYDRODIURIL) 25 MG tablet      valsartan (DIOVAN) 320 MG tablet          Outstanding Labs/Studies   None  Duration of Discharge Encounter   Greater than 30 minutes including physician time.  Signed, Reino Bellis NP-C 04/01/2016, 8:40 AM

## 2016-04-01 NOTE — Progress Notes (Signed)
Discharged to home with family office visits in place teaching done  

## 2016-04-03 ENCOUNTER — Encounter (HOSPITAL_COMMUNITY): Payer: Self-pay | Admitting: Cardiology

## 2016-04-05 ENCOUNTER — Telehealth: Payer: Self-pay | Admitting: Cardiology

## 2016-04-05 NOTE — Telephone Encounter (Signed)
TOC phone call .Marland Kitchen Appt is on10/24/17 at 8:30am w/ Kerin Ransom

## 2016-04-06 NOTE — Telephone Encounter (Signed)
Patient contacted regarding discharge from [facility] on 04/01/16.  Patient understands to follow up with provider LUKE KILROY on 10/24/17at 8:30 AM at [facility]. Patient understands discharge instructions? yes  Patient understands medications and regiment? yes  Patient understands to bring all medications to this visit? yes   PATIENT  STATES SHE UNABLE TO COME AT 8:30 AM - APPOINTMENT SWITCH TO 11 AM SAME DAY. PATIENT VOICED UNDERSTANDING

## 2016-04-11 ENCOUNTER — Encounter: Payer: Self-pay | Admitting: Cardiology

## 2016-04-11 ENCOUNTER — Ambulatory Visit (INDEPENDENT_AMBULATORY_CARE_PROVIDER_SITE_OTHER): Payer: Commercial Managed Care - HMO | Admitting: Cardiology

## 2016-04-11 ENCOUNTER — Ambulatory Visit: Payer: Commercial Managed Care - HMO | Admitting: Cardiology

## 2016-04-11 VITALS — BP 142/90 | HR 95 | Ht 60.0 in | Wt 141.0 lb

## 2016-04-11 DIAGNOSIS — R55 Syncope and collapse: Secondary | ICD-10-CM

## 2016-04-11 DIAGNOSIS — I480 Paroxysmal atrial fibrillation: Secondary | ICD-10-CM | POA: Diagnosis not present

## 2016-04-11 DIAGNOSIS — T462X5A Adverse effect of other antidysrhythmic drugs, initial encounter: Secondary | ICD-10-CM

## 2016-04-11 DIAGNOSIS — I4891 Unspecified atrial fibrillation: Secondary | ICD-10-CM | POA: Diagnosis not present

## 2016-04-11 DIAGNOSIS — R269 Unspecified abnormalities of gait and mobility: Secondary | ICD-10-CM

## 2016-04-11 DIAGNOSIS — I10 Essential (primary) hypertension: Secondary | ICD-10-CM

## 2016-04-11 DIAGNOSIS — J984 Other disorders of lung: Secondary | ICD-10-CM

## 2016-04-11 DIAGNOSIS — Z7901 Long term (current) use of anticoagulants: Secondary | ICD-10-CM | POA: Insufficient documentation

## 2016-04-11 DIAGNOSIS — Z853 Personal history of malignant neoplasm of breast: Secondary | ICD-10-CM

## 2016-04-11 MED ORDER — HYDROCHLOROTHIAZIDE 12.5 MG PO CAPS: ORAL_CAPSULE | ORAL | 1 refills | Status: DC

## 2016-04-11 NOTE — Assessment & Plan Note (Signed)
Plan was for short term Eliquis after recent CV - high risk of falls

## 2016-04-11 NOTE — Patient Instructions (Addendum)
Medication Instructions:  Your physician has recommended you make the following change in your medication:  1.  START Hydrochlorothiazide 12.5 taking 1 tablet every other day AS NEEDED for lower extremity edema  Labwork: None ordered  Testing/Procedures: None ordered  Follow-Up: Your physician recommends that you schedule a follow-up appointment in: THE [facility] ON THE 1ST AVAILABLE AND IN 3 MONTHS WITH DR. HARDING   Any Other Special Instructions Will Be Listed Below (If Applicable).     If you need a refill on your cardiac medications before your next appointment, please call your pharmacy.

## 2016-04-11 NOTE — Assessment & Plan Note (Signed)
She reports "5" syncopal spells-last episode was June 2017.

## 2016-04-11 NOTE — Assessment & Plan Note (Signed)
Stopped Feb 2017

## 2016-04-11 NOTE — Assessment & Plan Note (Signed)
Controlled.  

## 2016-04-11 NOTE — Progress Notes (Signed)
04/11/2016 Sara [name]   10-28-1925  [id number]  Primary Physician Townsend Roger, MD Primary Cardiologist: Dr Ellyn Hack  HPI:  82 y.o.female with past medical history outlined below, recently seen by Dr. Ellyn Hack in the office on October 6. 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 She has not been anticoagulated, chart indicating falling spells and syncope, felt to be overall poor candidate. She apparently was holding NSR till recently when she presented to [facility] 03/29/16 palpations and SOB and was found to be in AF with RVR.  She was seen in consult by Dr Curt Bears. The plan was for TEE/CV followed by short term anticogulation. She was also considering link implant to further evlauate her history of syncope but she declined this.   She is in the office today with her son for follow up. She thinks she went back into AF about three days after discharge. She is symptomatic with this, increased dyspnea and awareness of palpitations.    Current Outpatient Prescriptions  Medication Sig Dispense Refill  . acetaminophen (TYLENOL) 500 MG tablet Take 500 mg by mouth daily as needed.    Marland Kitchen anastrozole (ARIMIDEX) 1 MG tablet Take 1 mg by mouth daily.    Marland Kitchen apixaban (ELIQUIS) 2.5 MG TABS tablet Take 1 tablet (2.5 mg total) by mouth 2 (two) times daily. 60 tablet 2  . Cholecalciferol (VITAMIN D3) 1000 units CAPS Take 1 tablet by mouth daily.  3  . diltiazem (CARDIZEM CD) 120 MG 24 hr capsule Take 1 capsule (120 mg total) by mouth daily. 30 capsule 6  . metoprolol tartrate (LOPRESSOR) 25 MG tablet Take 1 tablet (25 mg total) by mouth 2 (two) times daily. 60 tablet 6  . rOPINIRole (REQUIP) 1 MG tablet Take 1 tablet by mouth daily.  3  . hydrochlorothiazide (MICROZIDE) 12.5 MG capsule Take 1 tablet every other day as needed for lower  extremity edema 45 capsule 1   No current facility-administered medications for this visit.     Allergies  Allergen Reactions  . Amiodarone Shortness Of Breath    Stopped in Feb 2017 secondary to pulmonary complications  . Benicar [Olmesartan]     Unclear of the intolerance  . Bystolic [Nebivolol Hcl]   . Clonidine Derivatives   . Exforge [Amlodipine Besylate-Valsartan] Other (See Comments)    Unclear  . Hctz [Hydrochlorothiazide]     Currently taking without problem; question if this has to do with hypokalemia  . Hydralazine Other (See Comments)    Also tolerated, but had orthostatic changes on standing medication  . Lisinopril   . Multaq [Dronedarone] Nausea And Vomiting  . Rythmol [Propafenone]   . Statins   . Tekturna [Aliskiren]   . Coreg [Carvedilol] Other (See Comments)    fatigue  . Prednisone     Jittery     Social History   Social History  . Marital status: Widowed    Spouse name: N/A  . Number of children: 1  . Years of education: N/A   Occupational History  .  Retired    Social History Main Topics  . Smoking status: Never Smoker  . Smokeless tobacco: Never Used  . Alcohol use No  . Drug use: No  . Sexual activity: Not on file   Other Topics Concern  . Not on file   Social History Narrative  She is a widowed mother of one and grandmother of one.  She does not exercise.  She does not smoke and does not drink alcohol.     Review of Systems: General: negative for chills, fever, night sweats or weight changes.  Cardiovascular: negative for chest pain, dyspnea on exertion, edema, orthopnea, palpitations, paroxysmal nocturnal dyspnea or shortness of breath Dermatological: negative for rash Respiratory: negative for cough or wheezing Urologic: negative for hematuria Abdominal: negative for nausea, vomiting, diarrhea, bright red blood per rectum, melena, or hematemesis Neurologic: negative for visual changes, syncope, or dizziness All other systems  reviewed and are otherwise negative except as noted above.    Blood pressure (!) 142/90, pulse 95, height 5' (1.524 m), weight 141 lb (64 kg).  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  Gait abnormality Uses a walker   PLAN  I suggested we have her seen in the AF clinic. The pt has symptomatic PAF and has been intolerant to many antiarrhythmics in the past. I don't believe we ever received the actually records from [facility] when she was taken off Amiodarone, I wonder if we could try this again at a lower dose.    She complained of LE edema. I think this is from Diltiazem (new) but it appears mild and I encouraged her to continue this as it appears to helping with rate control.   Kerin Ransom PA-C 04/11/2016 1:25 PM

## 2016-04-11 NOTE — Assessment & Plan Note (Signed)
Uses a walker

## 2016-04-11 NOTE — Assessment & Plan Note (Signed)
Recurrent PAF requiring recent hospitalization and TEE CV 10/11-10/14- Now back in AF

## 2016-04-20 ENCOUNTER — Encounter (HOSPITAL_COMMUNITY): Payer: Self-pay | Admitting: Nurse Practitioner

## 2016-04-20 ENCOUNTER — Ambulatory Visit (HOSPITAL_COMMUNITY)
Admission: RE | Admit: 2016-04-20 | Discharge: 2016-04-20 | Disposition: A | Discharge status: Home / Self Care | Payer: Commercial Managed Care - HMO | Source: Ambulatory Visit | Attending: Nurse Practitioner | Admitting: Nurse Practitioner

## 2016-04-20 VITALS — BP 128/72 | HR 108 | Ht 60.0 in | Wt 146.0 lb

## 2016-04-20 DIAGNOSIS — Z90710 Acquired absence of both cervix and uterus: Secondary | ICD-10-CM | POA: Diagnosis not present

## 2016-04-20 DIAGNOSIS — Z7901 Long term (current) use of anticoagulants: Secondary | ICD-10-CM | POA: Insufficient documentation

## 2016-04-20 DIAGNOSIS — M1990 Unspecified osteoarthritis, unspecified site: Secondary | ICD-10-CM | POA: Diagnosis not present

## 2016-04-20 DIAGNOSIS — K449 Diaphragmatic hernia without obstruction or gangrene: Secondary | ICD-10-CM | POA: Insufficient documentation

## 2016-04-20 DIAGNOSIS — K219 Gastro-esophageal reflux disease without esophagitis: Secondary | ICD-10-CM | POA: Diagnosis not present

## 2016-04-20 DIAGNOSIS — F419 Anxiety disorder, unspecified: Secondary | ICD-10-CM | POA: Diagnosis not present

## 2016-04-20 DIAGNOSIS — I480 Paroxysmal atrial fibrillation: Secondary | ICD-10-CM | POA: Insufficient documentation

## 2016-04-20 DIAGNOSIS — Z808 Family history of malignant neoplasm of other organs or systems: Secondary | ICD-10-CM | POA: Diagnosis not present

## 2016-04-20 DIAGNOSIS — Z9049 Acquired absence of other specified parts of digestive tract: Secondary | ICD-10-CM | POA: Diagnosis not present

## 2016-04-20 DIAGNOSIS — Z833 Family history of diabetes mellitus: Secondary | ICD-10-CM | POA: Insufficient documentation

## 2016-04-20 DIAGNOSIS — Z9842 Cataract extraction status, left eye: Secondary | ICD-10-CM | POA: Diagnosis not present

## 2016-04-20 DIAGNOSIS — Z8249 Family history of ischemic heart disease and other diseases of the circulatory system: Secondary | ICD-10-CM | POA: Insufficient documentation

## 2016-04-20 DIAGNOSIS — E785 Hyperlipidemia, unspecified: Secondary | ICD-10-CM | POA: Insufficient documentation

## 2016-04-20 DIAGNOSIS — Z888 Allergy status to other drugs, medicaments and biological substances status: Secondary | ICD-10-CM | POA: Diagnosis not present

## 2016-04-20 DIAGNOSIS — R600 Localized edema: Secondary | ICD-10-CM | POA: Insufficient documentation

## 2016-04-20 DIAGNOSIS — Z9841 Cataract extraction status, right eye: Secondary | ICD-10-CM | POA: Insufficient documentation

## 2016-04-20 DIAGNOSIS — I10 Essential (primary) hypertension: Secondary | ICD-10-CM | POA: Diagnosis not present

## 2016-04-20 DIAGNOSIS — I4891 Unspecified atrial fibrillation: Secondary | ICD-10-CM | POA: Diagnosis not present

## 2016-04-20 DIAGNOSIS — J8489 Other specified interstitial pulmonary diseases: Secondary | ICD-10-CM | POA: Insufficient documentation

## 2016-04-20 DIAGNOSIS — Z96651 Presence of right artificial knee joint: Secondary | ICD-10-CM | POA: Diagnosis not present

## 2016-04-21 ENCOUNTER — Telehealth: Payer: Self-pay | Admitting: Cardiology

## 2016-04-21 NOTE — Telephone Encounter (Signed)
From Donna's OV notes yesterday: "Pt will not let me increase rate control today because she feels both drugs are causing intolerable side effects and wants to stop both drugs Just to appease pt will let her decrease metoprolol by 1/2 dose to see if shortness of breath improves, but told her if HR significantly increases or she becomes more short of breath with this dose go back to previous dose For lower extremity edema, advised to take hctz 3 days in a row, currently states that she takes qod, then return to qod Continue apixaban 2.5 mg bid for now"  I spoke with patient regarding her symptoms. She reports same findings as assessed yesterday. I reinforced instruction for med changes. We discussed thoroughly for about 10 minutes. Seems the patient just needed assurance that the plan given by Butch Penny made sense. I reminded her that with the metoprolol adjustment, very likely improvement of symptoms would not be immediate.  We discussed temporary increase with HCTZ. She is aware to call if she does not gain improvement after weekend. She is also aware to go to ED if she gets fatigued or short of breath at rest. Pt did express some frustration and stated 2 times during our conversation "I'm going to stop everything if this doesn't get better soon".  We discussed danger of stopping or altering dose of any medication without provider advice. Patient voiced understanding. She agreed to call and speak with [place] before she makes any changes with her medication regimen. She is agreeable to follow up with pulmonology as scheduled.

## 2016-04-21 NOTE — Telephone Encounter (Signed)
°  Pt c/o medication issue:Thinks she may be allergeric to the new BP medication Kerin Ransom prescribe. She is experiencing Shortness of breathe   1. Name of Medication: Eiltiazemhcle 120mg  and Metoprolo 25mg    2. How are you currently taking this medication (dosage and times per day)? Once a day/Twice a day for 2nd medication  3. Are you having a reaction (difficulty breathing--STAT)? Yes   4. What is your medication issue? Difficulty breathing

## 2016-04-21 NOTE — Progress Notes (Signed)
Primary Care Physician: Sara Roger, MD Referring Physician:Luke Rosalyn [name] , [place] Cardiologist:Dr. Tressie [name] Sara [name] is a 82 y.o. female with a h/o paroxysmal  atrial fibrillation being seen for evaluation in the afib clinic. 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 She has not been anticoagulated, chart indicating falling spells and syncope, felt to be overall poor candidate. She apparently was holding NSR till recently when she presented to [facility] 03/29/16 palpations and SOB and was found to be in AF with RVR.  She was seen in consult by Dr Sara [name]. The plan was for TEE/CV followed by short term anticogulation. She feels that she went back into afib 3 days after d/c. She is symptomatic with shortness of breath,weakness, insomnia, LLE. She feels the LLE is from the cardizem and her shortness of breath/insomnia is from the BB, despite being in afib with elevated v rates.She was placed on both of these drugs on discharge. She said that she had problems with both of these drugs many years ago, especially BB which caused her vision to be very poor and it was stopped with immediate improvement.  She wants both of these drugs stopped for she thinks they are causing all her symptoms and remarks that she has never has shortness of breath with her afib in the past. She also has been diagnosed with BOOP which may be contributing to shortness of breath as well, has not seen pulmonology for this. She has failed multaq,rythmol in the past.  Today, she denies symptoms of palpitations, chest [name], shortness of breath, orthopnea, PND, lower extremity edema, dizziness, presyncope, syncope, or neurologic sequela. The patient is tolerating medications without difficulties and is otherwise without complaint today.   Past Medical History:    Diagnosis Date  . Ankle edema    Mild, which is intermittent, usually mildly dependent, left slightly greater than the right.  . Anxiety   . Arthritis    In her knees  . Balance problem    Due to weakened knee  . Corneal edema    Severe corneal edema in right eye with multiple folds. Developed after cataract surgery 05/03/09  . GERD (gastroesophageal reflux disease)   . H/O echocardiogram    Echo 03/01/11 EF = >55%. Shows normal systolic function with mild to moderate LV hypertrophy. Left atrial dimension 3.8 cm & a pulmonary artery pressure of 38. There was breakthrough diastolic dysfunction.  . Hematuria    Resolved after coumadin was stopped  . Hiatal hernia    With GERD  . Hyperglycemia    Random, without any history of diabetes  . Hyperlipidemia   . Hypertension    Difficult to control. Renal Doppler 06/02/10 showed less that 60% bilateral renal artery narrowing.  . Hypokalemia   . Paroxysmal atrial fibrillation (HCC)    On amiodarone but not on anticoagulant  . Posthemorrhagic anemia   . Pseudophakia   . Syncope 02/22/11   During OV on 02/22/11 her INR was 7.3 and was given 2.5 mg of vitamin K. Later that day she became diaphoretic & fainted.  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 BREAST BIOPSY    . CARDIOVERSION N/A 03/31/2016   Procedure: CARDIOVERSION;  Surgeon: Sara Pain, MD;  Location: [place];  Service: Cardiovascular;  Laterality: N/A;  . CATARACT EXTRACTION  05/03/09  . CHOLECYSTECTOMY    . HEMORRHOID SURGERY    . Remote hysterectomy    . TEE WITHOUT CARDIOVERSION N/A 03/31/2016   Procedure: TRANSESOPHAGEAL ECHOCARDIOGRAM (TEE);  Surgeon: Sara Pain, MD;  Location: [facility] ENDOSCOPY;  Service: Cardiovascular;  Laterality: N/A;  . TONSILLECTOMY    . TOTAL KNEE ARTHROPLASTY Right 08/26/10   By Dr. Elta Guadeloupe C. [name]. Cemented, computer assist    Current Outpatient Prescriptions  Medication Sig  Dispense Refill  . anastrozole (ARIMIDEX) 1 MG tablet Take 1 mg by mouth daily.    Marland Kitchen apixaban (ELIQUIS) 2.5 MG TABS tablet Take 1 tablet (2.5 mg total) by mouth 2 (two) times daily. 60 tablet 2  . Cholecalciferol (VITAMIN D3) 1000 units CAPS Take 1 tablet by mouth daily.  3  . diltiazem (CARDIZEM CD) 120 MG 24 hr capsule Take 1 capsule (120 mg total) by mouth daily. 30 capsule 6  . hydrochlorothiazide (MICROZIDE) 12.5 MG capsule Take 1 tablet every other day as needed for lower extremity edema 45 capsule 1  . metoprolol tartrate (LOPRESSOR) 25 MG tablet Take 1 tablet (25 mg total) by mouth 2 (two) times daily. 60 tablet 6  . rOPINIRole (REQUIP) 1 MG tablet Take 1 tablet by mouth daily.  3   No current facility-administered medications for this encounter.     Allergies  Allergen Reactions  . Amiodarone Shortness Of Breath    Stopped in Feb 2017 secondary to pulmonary complications  . Benicar [Olmesartan]     Unclear of the intolerance  . Bystolic [Nebivolol Hcl]   . Clonidine Derivatives   . Exforge [Amlodipine Besylate-Valsartan] Other (See Comments)    Unclear  . Hctz [Hydrochlorothiazide]     Currently taking without problem; question if this has to do with hypokalemia  . Hydralazine Other (See Comments)    Also tolerated, but had orthostatic changes on standing medication  . Lisinopril   . Multaq [Dronedarone] Nausea And Vomiting  . Rythmol [Propafenone]   . Statins   . Tekturna [Aliskiren]   . Coreg [Carvedilol] Other (See Comments)    fatigue  . Prednisone     Jittery     Social History   Social History  . Marital status: Widowed    Spouse name: N/A  . Number of children: 1  . Years of education: N/A   Occupational History  .  Retired    Social History Main Topics  . Smoking status: Never Smoker  . Smokeless tobacco: Never Used  . Alcohol use No  . Drug use: No  . Sexual activity: Not on file   Other Topics Concern  . Not on file   Social History  Narrative   She is a widowed mother of one and grandmother of one.  She does not exercise.  She does not smoke and does not drink alcohol.    Family History  Problem Relation Age of Onset  . Cancer Mother     Mouth  . Hypertension Mother   . Coronary artery disease Brother   . Diabetes Brother     Type 2  . Hypertension Brother   . Heart attack Sister   . Hypertension Sister     ROS- All systems are reviewed and negative except as per the HPI above  Physical Exam: Vitals:   04/20/16 1533  BP: 128/72  Pulse: (!) 108  Weight: 146 lb (66.2 kg)  Height: 5' (1.524 m)  GEN- The patient is well appearing, alert and oriented x 3 today.   Head- normocephalic, atraumatic Eyes-  Sclera clear, conjunctiva pink Ears- hearing intact Oropharynx- clear Neck- supple, no JVP Lymph- no cervical lymphadenopathy Lungs- Clear to ausculation bilaterally, normal work of breathing Heart- irregular rate and rhythm, no murmurs, rubs or gallops, PMI not laterally displaced GI- soft, NT, ND, + BS Extremities- no clubbing, cyanosis, or  2+ edema MS- no significant deformity or atrophy Skin- no rash or lesion Psych- euthymic mood, full affect Neuro- strength and sensation are intact  EKG-afib at 108 bpm, qrs int 140 ms, qtc 536 ms Epic records reviewed CXR-FINDINGS: Cardiac shadow is mildly enlarged. A hiatal hernia is again seen. Chronic interstitial changes are noted bilaterally without focal confluent infiltrate. Degenerative changes of thoracic spine are Noted. Echo- - Vigorous LV systolic function with intracavitary gradient of 1.7   m/s; mild LVH; elevated LV filling pressure; biatrial   enlargement; trace AI; mild TR with mildly elevated pulmonary   pressure. Left atrium severely dilated at 46 mm, LA volume at 72.8  IMPRESSION: Chronic changes without acute abnormality.   Assessment and Plan: 1. Paroxysmal afib Successful cardioversion but ERAF Very complex pt to  inability to use amiodarone for BOOP(interstitial lung disease) and failure of multaq, rhythmol in past I doubt candidate for tikosyn, sotalol due to prolonged qtc on previous EKG's Question ability to maintain sinus rhythm long term due to severley enlarged left atrium Pt will not let me increase rate control today because she feels both drugs are causing intolerable side effects and wants to stop both drugs Just to appease pt will let her decrease metoprolol by 1/2 dose to see if shortness of breath improves, but told her if HR significantly increases or she becomes more short of breath with this dose go back to previous dose For lower extremity edema, advised to take hctz 3 days in a row, currently states that she takes qod, then return to qod Continue apixaban 2.5 mg bid for now  2. Interstitial lung disease Will obtain pulmonology consult to see if contributing to shortness of breath It is possible that BB is aggregating lung disease, and can try to decrease dose but fear of increase in v rate I still feel that afib is contributing to shortness of breath but difficult to convince pt  I discussed pt with Dr. Curt [name] and he is agreeable to seeing her. If no acceptable antiarrythmic can be found, then pt may be a AV nodal ablation/PPM candidate  Butch Penny C. Yumna Ebers, [facility] [address] [place],  [phone]

## 2016-04-25 DIAGNOSIS — R252 Cramp and spasm: Secondary | ICD-10-CM | POA: Diagnosis not present

## 2016-04-25 DIAGNOSIS — I5032 Chronic diastolic (congestive) heart failure: Secondary | ICD-10-CM | POA: Diagnosis not present

## 2016-04-25 DIAGNOSIS — I4891 Unspecified atrial fibrillation: Secondary | ICD-10-CM | POA: Diagnosis not present

## 2016-04-27 ENCOUNTER — Ambulatory Visit (INDEPENDENT_AMBULATORY_CARE_PROVIDER_SITE_OTHER): Payer: Commercial Managed Care - HMO | Admitting: Internal Medicine

## 2016-04-27 ENCOUNTER — Encounter: Payer: Self-pay | Admitting: Internal Medicine

## 2016-04-27 VITALS — BP 110/70 | HR 103 | Ht 60.0 in | Wt 150.8 lb

## 2016-04-27 DIAGNOSIS — J984 Other disorders of lung: Secondary | ICD-10-CM | POA: Diagnosis not present

## 2016-04-27 DIAGNOSIS — T462X5A Adverse effect of other antidysrhythmic drugs, initial encounter: Secondary | ICD-10-CM | POA: Diagnosis not present

## 2016-04-27 DIAGNOSIS — I4891 Unspecified atrial fibrillation: Secondary | ICD-10-CM

## 2016-04-27 NOTE — Progress Notes (Signed)
Subjective:    Patient ID: Sara Sara [name], female    DOB: 12-17-1925,     MRN: [id number]  HPI  13 yowf never smoker with h/o Amio lung toxicity made at [facility]   D/c 2/ 2017  limited chronically by knees but still able to house work until discharge from  last admission [facility] with [facility]  Admit date: 03/29/2016 Discharge date: 04/01/2016  Primary Care Provider: [place] Sara [name] Primary Cardiologist: Dr. Ellyn [name]  Discharge Diagnoses    Principal Problem:   Atrial fibrillation with rapid ventricular response (Sara Sara [name]) Active Problems:   Amiodarone Sara [name] toxicity   Atypical syncope   Paroxysmal atrial fibrillation ([facility])   Demand ischemia ([facility])   Allergies      Allergies  Allergen Reactions  . Amiodarone Shortness Of Breath    Stopped in Feb 2017 secondary to Sara [name] complications  . Benicar [Olmesartan]     Unclear of the intolerance  . Bystolic [Nebivolol Hcl]   . Clonidine Derivatives   . Exforge [Amlodipine Besylate-Valsartan] Other (See Comments)    Unclear  . Hctz [Hydrochlorothiazide]     Currently taking without problem; question if this has to do with hypokalemia  . Hydralazine Other (See Comments)    Also tolerated, but had orthostatic changes on standing medication  . Lisinopril   . Multaq [Dronedarone] Nausea And Vomiting  . Rythmol [Propafenone]   . Statins   . Tekturna [Aliskiren]   . Coreg [Carvedilol] Other (See Comments)    fatigue  . Prednisone     Jittery     Diagnostic Studies/Procedures    TEE/DCCV: 10/13  Study Conclusions  - Left ventricle: Systolic function was normal. The estimated ejection fraction was in the range of 55% to 60%. - Aortic valve: No evidence of vegetation. - Mitral valve: No evidence of vegetation. There was mild regurgitation. - Left atrium: The atrium was mildly dilated. No evidence of thrombus in the appendage. No evidence of thrombus in the appendage. - Right  atrium: No evidence of thrombus in the atrial cavity or appendage. - Tricuspid valve: No evidence of vegetation. There was mild regurgitation. - Pulmonic valve: No evidence of vegetation. - Superior vena cava: The study excluded a thrombus. _____________   History of Present Illness     [name] an 82 y.o.female recently seen by Dr. Ellyn [name] in the office on October 6. 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 She has not been anticoagulated recently, chart indicating falling spells and syncope, felt to be overall poor candidate.  She presented to the ED describing a feeling of palpitations and shortness of breath since 10/9. She has had no chest pain. Reportedly saw PCP earlier the day of admission and was told to Sara [name] to ER for further assessment based on elevated heart rate. Heart rate in the 150s to 160s at presentation with ECG showing rapid atrial fibrillation with right bundle branch block, rule out old lateral infarct pattern, nonspecific ST changes. She was placed on intravenous diltiazem by ER staff with heart rate coming down to the 120s.  Labwork showed abnormal troponin I of 0.28 and BNP is elevated at 699. Chest x-ray shows chronic interstitial changes without infiltrates or obvious edema.  She has multiple drug allergies/intolerances as detailed below including prior antiarrhythmics - amiodarone, Rythmol, and Multaq.  Hospital Course     Consultants: EP  Attempts were made to help further control her rate with the addition  of metoprolol and digoxin the following day. She was initially started on heparin for anticoagulation, but changed to Eliquis. Initially was not on anticoagulation outside the hospital as per her decision, but was agreeable to starting anticoagulation for 6 weeks. She also reported episodes of syncope prior to this admission.  She was evaluated by EP this  admission who felt she would benefit from [facility] monitor in the outpatient setting given her reports of syncope, and concern of tachy-brady syndrome.   Her rate improved but she did not convert on medical therapy, so plans where made to proceed with TEE/DCCV the following day. Labs were stable this admission with a trop noted around 0.2 with a flat non-diagnostic trend likely related to demand ischemia. She underwent successful TEE/DCCV with Dr. Marlou [name] on 10/13 with conversion to SR and no thrombus noted with normal EF. She did become slightly bradycardiac post procedure, with her Cardizem held a few hours and resumed at 30mg  q6hr afterwards.   On 10/14 telemetry noted sinus bradycardia, no further episodes of atrial fib. Cardizem was transitioned to 120mg  daily with metoprolol continued. Plan to stay on Eliquis for the next 6 weeks. Will change her metoprolol from 12.5 q6hr to 25mg  BID. Her HCTZ and Diovan were held at discharge.   She was seen and assessed by Dr. Martin[place] on 10/14 and determined stable for discharge home. I have sent a staff message to arrange for follow up appt with APP in the office and to arrange follow up with EP regarding loop recorder placement.    04/27/2016 1st Sara Sara [name] office visit/ Sara Sara [name]   Chief Complaint  Patient presents with  . Sara [name] Consult    Referred by Dr. Roderic [name]. Pt c/o SOB for the past 2 wks- wakes up in the night feeling out of breath. She stopped taking Amiodarone approx 1 yr ago.   dry coughing/ breathing and leg swelling since 3 days after discharge and 04/25/16 rx lasix by Sara Sara [name] has helped = 20 mg bid  Noct smothering also since leg swelling and better sitting up / no am congestion or excess/ purulent sputum or mucus plugs   Main change at d/c was diovan/hctz ? Due to low bp from neg chronotropics to control RAF  No obvious day to day or daytime variability or assoc  cp or chest tightness, subjective wheeze or overt sinus or hb symptoms.  No unusual exp hx or h/o childhood pna/ asthma or knowledge of premature birth.    Also denies any obvious fluctuation of symptoms with weather or environmental changes or other aggravating or alleviating factors except as outlined above   Current Medications, Allergies, Complete Past Medical History, Past Surgical History, Family History, and Social History were reviewed in [employer] record.      Review of Systems  Constitutional: Negative for chills, fever and unexpected weight change.  HENT: Positive for postnasal drip. Negative for congestion, dental problem, ear pain, nosebleeds, rhinorrhea, sinus pressure, sneezing, sore throat, trouble swallowing and voice change.   Eyes: Negative for visual disturbance.  Respiratory: Positive for cough and shortness of breath. Negative for choking.   Cardiovascular: Positive for leg swelling. Negative for chest pain.  Gastrointestinal: Negative for abdominal pain, diarrhea and vomiting.  Genitourinary: Negative for difficulty urinating.  Musculoskeletal: Positive for arthralgias.  Skin: Negative for rash.  Neurological: Negative for tremors, syncope and headaches.  Hematological: Does not bruise/bleed easily.       Objective:   Physical Exam  W/c bound  elderly wf focused entirely on dx's and not symptoms/ extremely difficult hx  Wt Readings from Last 3 Encounters:  04/27/16 150 lb 12.8 oz (68.4 kg)  04/20/16 146 lb (66.2 kg)  04/11/16 141 lb (64 kg)    Vital signs reviewed  - Note on arrival 02 sats  96% on RA     HEENT: nl dentition, turbinates, and oropharynx. Nl external ear canals without cough reflex   NECK :  without JVD/Nodes/TM/ nl carotid upstrokes bilaterally   LUNGS: no acc muscle use,  Nl contour chest which is clear to A and P bilaterally without cough on insp or exp maneuvers   CV:  RRR  no s3 or murmur or increase in P2, no edema   ABD:  soft and nontender with nl inspiratory excursion in  the supine position. No bruits or organomegaly, bowel sounds nl  MS:  Nl gait/ ext warm without deformities, calf tenderness, cyanosis or clubbing No obvious joint restrictions   SKIN: warm and dry without lesions    NEURO:  alert, approp, nl sensorium with  no motor deficits      I personally reviewed images and agree with radiology impression as follows:  CXR:   03/29/16 Cardiac shadow is mildly enlarged. A hiatal hernia is again seen. Chronic interstitial changes are noted bilaterally without focal confluent infiltrate. Degenerative changes of thoracic spine are noted. Chronic changes without acute abnormality My impression: mod severe t kyphosis    BNP 03/29/16  = 699.6     TEEcho 03/31/16 - Left ventricle: Systolic function was normal. The estimated   ejection fraction was in the range of 55% to 60%. - Aortic valve: No evidence of vegetation. - Mitral valve: No evidence of vegetation. There was mild   regurgitation. - Left atrium: The atrium was mildly dilated. No evidence of   thrombus in the appendage. No evidence of thrombus in the   appendage. - Right atrium: No evidence of thrombus in the atrial cavity or   appendage. - Tricuspid valve: No evidence of vegetation. There was mild   regurgitation. - Pulmonic valve: No evidence of vegetation. - Superior vena cava: The study excluded a thrombus.     Assessment & Plan:

## 2016-04-27 NOTE — Patient Instructions (Addendum)
Let Nona Dell adjust your diuretics and if your breathing bothers you after your swelling is gone then return to this clinic

## 2016-04-28 DIAGNOSIS — R001 Bradycardia, unspecified: Secondary | ICD-10-CM | POA: Diagnosis not present

## 2016-04-28 DIAGNOSIS — Z7901 Long term (current) use of anticoagulants: Secondary | ICD-10-CM | POA: Diagnosis not present

## 2016-04-28 DIAGNOSIS — Z9181 History of falling: Secondary | ICD-10-CM | POA: Diagnosis not present

## 2016-04-28 DIAGNOSIS — I480 Paroxysmal atrial fibrillation: Secondary | ICD-10-CM | POA: Diagnosis not present

## 2016-04-28 DIAGNOSIS — Z79811 Long term (current) use of aromatase inhibitors: Secondary | ICD-10-CM | POA: Diagnosis not present

## 2016-04-28 DIAGNOSIS — I110 Hypertensive heart disease with heart failure: Secondary | ICD-10-CM | POA: Diagnosis not present

## 2016-04-28 DIAGNOSIS — I5032 Chronic diastolic (congestive) heart failure: Secondary | ICD-10-CM | POA: Diagnosis not present

## 2016-04-28 DIAGNOSIS — I495 Sick sinus syndrome: Secondary | ICD-10-CM | POA: Diagnosis not present

## 2016-04-28 NOTE — Assessment & Plan Note (Signed)
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  Discussed in detail all the  indications, usual  risks and alternatives  relative to the benefits with patient who agrees to proceed with conservative f/u as outlined    Total time devoted to counseling  = 35/76m review case with pt/son including extensive records in [facility] and the last admit in particular and discussion of options/alternatives/ personally creating written instructions  in presence of pt  then going over those specific  Instructions directly with the pt including how to use all of the meds but in particular covering each new medication in detail and the difference between the maintenance/automatic meds and the prns using an action plan format for the latter.

## 2016-04-28 NOTE — Assessment & Plan Note (Addendum)
Complicated by diastolic dysfunction and clearly needs additional co-ordinated care between PC/ cards/EP but not a candidate for any additional amiodarone rx  Pulmonary f/u is prn

## 2016-05-01 DIAGNOSIS — R609 Edema, unspecified: Secondary | ICD-10-CM | POA: Diagnosis not present

## 2016-05-02 DIAGNOSIS — I495 Sick sinus syndrome: Secondary | ICD-10-CM | POA: Diagnosis not present

## 2016-05-02 DIAGNOSIS — Z79811 Long term (current) use of aromatase inhibitors: Secondary | ICD-10-CM | POA: Diagnosis not present

## 2016-05-02 DIAGNOSIS — I480 Paroxysmal atrial fibrillation: Secondary | ICD-10-CM | POA: Diagnosis not present

## 2016-05-02 DIAGNOSIS — I110 Hypertensive heart disease with heart failure: Secondary | ICD-10-CM | POA: Diagnosis not present

## 2016-05-02 DIAGNOSIS — Z9181 History of falling: Secondary | ICD-10-CM | POA: Diagnosis not present

## 2016-05-02 DIAGNOSIS — R001 Bradycardia, unspecified: Secondary | ICD-10-CM | POA: Diagnosis not present

## 2016-05-02 DIAGNOSIS — Z7901 Long term (current) use of anticoagulants: Secondary | ICD-10-CM | POA: Diagnosis not present

## 2016-05-02 DIAGNOSIS — I5032 Chronic diastolic (congestive) heart failure: Secondary | ICD-10-CM | POA: Diagnosis not present

## 2016-05-03 DIAGNOSIS — I5032 Chronic diastolic (congestive) heart failure: Secondary | ICD-10-CM | POA: Diagnosis not present

## 2016-05-03 DIAGNOSIS — Z9181 History of falling: Secondary | ICD-10-CM | POA: Diagnosis not present

## 2016-05-03 DIAGNOSIS — Z79811 Long term (current) use of aromatase inhibitors: Secondary | ICD-10-CM | POA: Diagnosis not present

## 2016-05-03 DIAGNOSIS — I110 Hypertensive heart disease with heart failure: Secondary | ICD-10-CM | POA: Diagnosis not present

## 2016-05-03 DIAGNOSIS — Z7901 Long term (current) use of anticoagulants: Secondary | ICD-10-CM | POA: Diagnosis not present

## 2016-05-03 DIAGNOSIS — I480 Paroxysmal atrial fibrillation: Secondary | ICD-10-CM | POA: Diagnosis not present

## 2016-05-03 DIAGNOSIS — R001 Bradycardia, unspecified: Secondary | ICD-10-CM | POA: Diagnosis not present

## 2016-05-03 DIAGNOSIS — I495 Sick sinus syndrome: Secondary | ICD-10-CM | POA: Diagnosis not present

## 2016-05-05 DIAGNOSIS — I495 Sick sinus syndrome: Secondary | ICD-10-CM | POA: Diagnosis not present

## 2016-05-05 DIAGNOSIS — Z7901 Long term (current) use of anticoagulants: Secondary | ICD-10-CM | POA: Diagnosis not present

## 2016-05-05 DIAGNOSIS — R001 Bradycardia, unspecified: Secondary | ICD-10-CM | POA: Diagnosis not present

## 2016-05-05 DIAGNOSIS — Z9181 History of falling: Secondary | ICD-10-CM | POA: Diagnosis not present

## 2016-05-05 DIAGNOSIS — Z79811 Long term (current) use of aromatase inhibitors: Secondary | ICD-10-CM | POA: Diagnosis not present

## 2016-05-05 DIAGNOSIS — I5032 Chronic diastolic (congestive) heart failure: Secondary | ICD-10-CM | POA: Diagnosis not present

## 2016-05-05 DIAGNOSIS — I480 Paroxysmal atrial fibrillation: Secondary | ICD-10-CM | POA: Diagnosis not present

## 2016-05-05 DIAGNOSIS — I110 Hypertensive heart disease with heart failure: Secondary | ICD-10-CM | POA: Diagnosis not present

## 2016-05-08 ENCOUNTER — Telehealth: Payer: Self-pay | Admitting: Cardiology

## 2016-05-08 DIAGNOSIS — Z79811 Long term (current) use of aromatase inhibitors: Secondary | ICD-10-CM | POA: Diagnosis not present

## 2016-05-08 DIAGNOSIS — R001 Bradycardia, unspecified: Secondary | ICD-10-CM | POA: Diagnosis not present

## 2016-05-08 DIAGNOSIS — I495 Sick sinus syndrome: Secondary | ICD-10-CM | POA: Diagnosis not present

## 2016-05-08 DIAGNOSIS — I110 Hypertensive heart disease with heart failure: Secondary | ICD-10-CM | POA: Diagnosis not present

## 2016-05-08 DIAGNOSIS — I480 Paroxysmal atrial fibrillation: Secondary | ICD-10-CM | POA: Diagnosis not present

## 2016-05-08 DIAGNOSIS — I5032 Chronic diastolic (congestive) heart failure: Secondary | ICD-10-CM | POA: Diagnosis not present

## 2016-05-08 DIAGNOSIS — Z9181 History of falling: Secondary | ICD-10-CM | POA: Diagnosis not present

## 2016-05-08 DIAGNOSIS — Z7901 Long term (current) use of anticoagulants: Secondary | ICD-10-CM | POA: Diagnosis not present

## 2016-05-08 NOTE — Telephone Encounter (Signed)
Returned call. Patient noting numerous complaints. She does not feel like her medications are working. She has voiced frustration at seeing numerous providers recently. She notes she has continued to have leg swelling, increased HR, shortness of breath. Voices frustration at having unsuccessful cardioversion. She notes her HR was 138 this AM, "not that high right now", & BP "normal". She also notes she is SOB at rest "though better sometimes". She is short of breath at night.  I have advised ED evaluation due to symptoms. Expressed concern that she would need acute assessment for volume overload and confirmation of her rate and rhythm. Patient acknowledged my recommendation for ED evaluation. She declined this due to transportation - was not interested in calling EMS to home - she will be taken to [facility] and she voiced concern about going there since she doesn't have a cardiologist in [place]. I have made her recommendation to go and if she requests transfer to [facility], they may be able to accomodate the request.  She declined PA visit this week due to lack of transportation. She has asked for med dosing advice over the phone - aware I am unsure how to advise.

## 2016-05-08 NOTE — Telephone Encounter (Signed)
Pt c/o Shortness Of Breath: STAT if SOB developed within the last 24 hours or pt is noticeably SOB on the phone  1. Are you currently SOB (can you hear that pt is SOB on the phone)? no  2. How long have you been experiencing SOB? 3 weeks  3. Are you SOB when sitting or when up moving around? both 4. Are you currently experiencing any other symptoms? A-FIB

## 2016-05-10 ENCOUNTER — Encounter: Payer: Self-pay | Admitting: *Deleted

## 2016-05-11 NOTE — Telephone Encounter (Signed)
Unfortunately going to [place] will only delay her being seen by EP. At this point - I think our only option is to consider AV Node ablation & PPM.   We simply do not have any other medication options. Ever since Amiodarone was d/c'd this has been an issue.  Glenetta Hew, MD

## 2016-05-12 DIAGNOSIS — R001 Bradycardia, unspecified: Secondary | ICD-10-CM | POA: Diagnosis not present

## 2016-05-12 DIAGNOSIS — Z79811 Long term (current) use of aromatase inhibitors: Secondary | ICD-10-CM | POA: Diagnosis not present

## 2016-05-12 DIAGNOSIS — Z9181 History of falling: Secondary | ICD-10-CM | POA: Diagnosis not present

## 2016-05-12 DIAGNOSIS — I495 Sick sinus syndrome: Secondary | ICD-10-CM | POA: Diagnosis not present

## 2016-05-12 DIAGNOSIS — I480 Paroxysmal atrial fibrillation: Secondary | ICD-10-CM | POA: Diagnosis not present

## 2016-05-12 DIAGNOSIS — Z7901 Long term (current) use of anticoagulants: Secondary | ICD-10-CM | POA: Diagnosis not present

## 2016-05-12 DIAGNOSIS — I110 Hypertensive heart disease with heart failure: Secondary | ICD-10-CM | POA: Diagnosis not present

## 2016-05-12 DIAGNOSIS — I5032 Chronic diastolic (congestive) heart failure: Secondary | ICD-10-CM | POA: Diagnosis not present

## 2016-05-15 DIAGNOSIS — I110 Hypertensive heart disease with heart failure: Secondary | ICD-10-CM | POA: Diagnosis not present

## 2016-05-15 DIAGNOSIS — I480 Paroxysmal atrial fibrillation: Secondary | ICD-10-CM | POA: Diagnosis not present

## 2016-05-15 DIAGNOSIS — Z7901 Long term (current) use of anticoagulants: Secondary | ICD-10-CM | POA: Diagnosis not present

## 2016-05-15 DIAGNOSIS — Z9181 History of falling: Secondary | ICD-10-CM | POA: Diagnosis not present

## 2016-05-15 DIAGNOSIS — Z79811 Long term (current) use of aromatase inhibitors: Secondary | ICD-10-CM | POA: Diagnosis not present

## 2016-05-15 DIAGNOSIS — I5032 Chronic diastolic (congestive) heart failure: Secondary | ICD-10-CM | POA: Diagnosis not present

## 2016-05-15 DIAGNOSIS — I495 Sick sinus syndrome: Secondary | ICD-10-CM | POA: Diagnosis not present

## 2016-05-15 DIAGNOSIS — R001 Bradycardia, unspecified: Secondary | ICD-10-CM | POA: Diagnosis not present

## 2016-05-15 NOTE — Telephone Encounter (Signed)
Called to f/u w provider recommendations. Had pleasant, lengthy discussion w/ patient. She is aware of recommendations. She has appt w Dr. Curt Bears.   She expressed some concern for fluid gain, has been on lasix as prescribed by Dr. Nona Dell.  She sees him in office tomorrow - I advised to have him assess for her fluid gain and titrate lasix as needed. I noted concern that if her A Fib is uncontrolled for prolonged periods of time, this can affect pumping efficiency of the heart and thus contribute to fluid retention.  She denies acute dyspnea but states a lot of problems w fatigue on ambulation. She reports no new symptoms and that she is feeling about the same as she was a week ago. She has been assessed by home health RN who did not acknowledge any acute assessment concerns. Her BPs have been running at her baseline.  HR has been "running fast" but she does not have specific readings. She was sensitive to higher dose of metoprolol, unclear if she would benefit from other med changes.  She notes she did not feel she needed to be seen in ED last week - states same today.  I did express concern that she has continued to feel fatigued.  Unfortunately, she notes she is really not able to come in for same day appts. She had declined an appt last week and notes she's unable to come in sooner to see [place] d/t transportation arrangements - son lives/works out of town and she lives in [place]. Given that she sees Dr. Nona Dell tomorrow and can get advice there, I informed her it should be fine to follow up as scheduled w Dr. Curt Bears on Thursday. Aware I will see if Dr. Ellyn Hack has any further recommendations at this time.

## 2016-05-16 ENCOUNTER — Inpatient Hospital Stay (HOSPITAL_COMMUNITY)
Admission: EM | Admit: 2016-05-16 | Discharge: 2016-05-21 | DRG: 292 | Disposition: A | Discharge status: Home Health | Payer: Commercial Managed Care - HMO | Attending: Internal Medicine | Admitting: Internal Medicine

## 2016-05-16 ENCOUNTER — Encounter (HOSPITAL_COMMUNITY): Payer: Self-pay

## 2016-05-16 ENCOUNTER — Emergency Department (HOSPITAL_COMMUNITY): Payer: Commercial Managed Care - HMO

## 2016-05-16 DIAGNOSIS — Z961 Presence of intraocular lens: Secondary | ICD-10-CM | POA: Diagnosis present

## 2016-05-16 DIAGNOSIS — T462X5A Adverse effect of other antidysrhythmic drugs, initial encounter: Secondary | ICD-10-CM | POA: Diagnosis present

## 2016-05-16 DIAGNOSIS — H1820 Unspecified corneal edema: Secondary | ICD-10-CM | POA: Diagnosis present

## 2016-05-16 DIAGNOSIS — Z96651 Presence of right artificial knee joint: Secondary | ICD-10-CM | POA: Diagnosis present

## 2016-05-16 DIAGNOSIS — J90 Pleural effusion, not elsewhere classified: Secondary | ICD-10-CM | POA: Diagnosis not present

## 2016-05-16 DIAGNOSIS — M170 Bilateral primary osteoarthritis of knee: Secondary | ICD-10-CM | POA: Diagnosis present

## 2016-05-16 DIAGNOSIS — Z853 Personal history of malignant neoplasm of breast: Secondary | ICD-10-CM

## 2016-05-16 DIAGNOSIS — Z23 Encounter for immunization: Secondary | ICD-10-CM

## 2016-05-16 DIAGNOSIS — Z8249 Family history of ischemic heart disease and other diseases of the circulatory system: Secondary | ICD-10-CM

## 2016-05-16 DIAGNOSIS — F419 Anxiety disorder, unspecified: Secondary | ICD-10-CM | POA: Diagnosis present

## 2016-05-16 DIAGNOSIS — Z9849 Cataract extraction status, unspecified eye: Secondary | ICD-10-CM

## 2016-05-16 DIAGNOSIS — Z809 Family history of malignant neoplasm, unspecified: Secondary | ICD-10-CM | POA: Diagnosis not present

## 2016-05-16 DIAGNOSIS — I5043 Acute on chronic combined systolic (congestive) and diastolic (congestive) heart failure: Secondary | ICD-10-CM

## 2016-05-16 DIAGNOSIS — K219 Gastro-esophageal reflux disease without esophagitis: Secondary | ICD-10-CM | POA: Diagnosis present

## 2016-05-16 DIAGNOSIS — I5033 Acute on chronic diastolic (congestive) heart failure: Secondary | ICD-10-CM | POA: Diagnosis not present

## 2016-05-16 DIAGNOSIS — Z833 Family history of diabetes mellitus: Secondary | ICD-10-CM | POA: Diagnosis not present

## 2016-05-16 DIAGNOSIS — I4891 Unspecified atrial fibrillation: Secondary | ICD-10-CM | POA: Diagnosis not present

## 2016-05-16 DIAGNOSIS — K449 Diaphragmatic hernia without obstruction or gangrene: Secondary | ICD-10-CM | POA: Diagnosis present

## 2016-05-16 DIAGNOSIS — I480 Paroxysmal atrial fibrillation: Secondary | ICD-10-CM | POA: Diagnosis present

## 2016-05-16 DIAGNOSIS — E785 Hyperlipidemia, unspecified: Secondary | ICD-10-CM | POA: Diagnosis present

## 2016-05-16 DIAGNOSIS — I5031 Acute diastolic (congestive) heart failure: Secondary | ICD-10-CM | POA: Diagnosis not present

## 2016-05-16 DIAGNOSIS — Z7901 Long term (current) use of anticoagulants: Secondary | ICD-10-CM | POA: Diagnosis not present

## 2016-05-16 DIAGNOSIS — I10 Essential (primary) hypertension: Secondary | ICD-10-CM | POA: Diagnosis present

## 2016-05-16 DIAGNOSIS — E876 Hypokalemia: Secondary | ICD-10-CM

## 2016-05-16 DIAGNOSIS — I110 Hypertensive heart disease with heart failure: Secondary | ICD-10-CM | POA: Diagnosis not present

## 2016-05-16 DIAGNOSIS — I4510 Unspecified right bundle-branch block: Secondary | ICD-10-CM | POA: Diagnosis present

## 2016-05-16 DIAGNOSIS — Z888 Allergy status to other drugs, medicaments and biological substances status: Secondary | ICD-10-CM

## 2016-05-16 DIAGNOSIS — J849 Interstitial pulmonary disease, unspecified: Secondary | ICD-10-CM | POA: Diagnosis not present

## 2016-05-16 DIAGNOSIS — I509 Heart failure, unspecified: Secondary | ICD-10-CM

## 2016-05-16 DIAGNOSIS — I5032 Chronic diastolic (congestive) heart failure: Secondary | ICD-10-CM | POA: Diagnosis present

## 2016-05-16 LAB — BASIC METABOLIC PANEL
ANION GAP: 8 (ref 5–15)
BUN: 16 mg/dL (ref 6–20)
CO2: 26 mmol/L (ref 22–32)
Calcium: 9 mg/dL (ref 8.9–10.3)
Chloride: 111 mmol/L (ref 101–111)
Creatinine, Ser: 0.89 mg/dL (ref 0.44–1.00)
GFR calc Af Amer: >60 mL/min (ref >60)
GFR calc non Af Amer: 56 mL/min — ABNORMAL LOW (ref >60)
GLUCOSE: 133 mg/dL — AB (ref 65–99)
POTASSIUM: 2.7 mmol/L — AB (ref 3.5–5.1)
Sodium: 145 mmol/L (ref 135–145)

## 2016-05-16 LAB — CBC
HEMATOCRIT: 34.3 % — AB (ref 36.0–46.0)
HEMOGLOBIN: 10.9 g/dL — AB (ref 12.0–15.0)
MCH: 24.8 pg — AB (ref 26.0–34.0)
MCHC: 31.8 g/dL (ref 30.0–36.0)
MCV: 78 fL (ref 78.0–100.0)
Platelets: 284 10*3/uL (ref 150–400)
RBC: 4.4 MIL/uL (ref 3.87–5.11)
RDW: 15.9 % — ABNORMAL HIGH (ref 11.5–15.5)
WBC: 5.7 10*3/uL (ref 4.0–10.5)

## 2016-05-16 LAB — I-STAT TROPONIN, ED: Troponin i, poc: 0 ng/mL (ref 0.00–0.08)

## 2016-05-16 LAB — BRAIN NATRIURETIC PEPTIDE: B Natriuretic Peptide: 362.3 pg/mL — ABNORMAL HIGH (ref 0.0–100.0)

## 2016-05-16 MED ORDER — SODIUM CHLORIDE 0.9% FLUSH
3.0000 mL | INTRAVENOUS | Status: DC | PRN
  Administered 2016-05-19: 3 mL via INTRAVENOUS
  Filled 2016-05-16: qty 3

## 2016-05-16 MED ORDER — POTASSIUM CHLORIDE CRYS ER 20 MEQ PO TBCR
40.0000 meq | EXTENDED_RELEASE_TABLET | Freq: Once | ORAL | Status: AC
  Administered 2016-05-16: 40 meq via ORAL
  Filled 2016-05-16: qty 2

## 2016-05-16 MED ORDER — SODIUM CHLORIDE 0.9 % IV SOLN: 250.0000 mL | INTRAVENOUS | Status: DC | PRN

## 2016-05-16 MED ORDER — DILTIAZEM HCL ER COATED BEADS 120 MG PO CP24
120.0000 mg | ORAL_CAPSULE | Freq: Every day | ORAL | Status: DC
  Administered 2016-05-17 – 2016-05-21 (×5): 120 mg via ORAL
  Filled 2016-05-16 (×5): qty 1

## 2016-05-16 MED ORDER — SODIUM CHLORIDE 0.9 % IV SOLN
30.0000 meq | Freq: Once | INTRAVENOUS | Status: AC
  Administered 2016-05-16: 30 meq via INTRAVENOUS
  Filled 2016-05-16: qty 15

## 2016-05-16 MED ORDER — ANASTROZOLE 1 MG PO TABS
1.0000 mg | ORAL_TABLET | ORAL | Status: DC
  Administered 2016-05-17 – 2016-05-21 (×5): 1 mg via ORAL
  Filled 2016-05-16 (×6): qty 1

## 2016-05-16 MED ORDER — METOPROLOL TARTRATE 50 MG PO TABS
50.0000 mg | ORAL_TABLET | Freq: Two times a day (BID) | ORAL | Status: DC
  Administered 2016-05-17 – 2016-05-21 (×9): 50 mg via ORAL
  Filled 2016-05-16 (×6): qty 1
  Filled 2016-05-16: qty 2
  Filled 2016-05-16 (×2): qty 1

## 2016-05-16 MED ORDER — FUROSEMIDE 10 MG/ML IJ SOLN
40.0000 mg | Freq: Two times a day (BID) | INTRAMUSCULAR | Status: AC
  Administered 2016-05-16 – 2016-05-20 (×9): 40 mg via INTRAVENOUS
  Filled 2016-05-16 (×9): qty 4

## 2016-05-16 MED ORDER — SODIUM CHLORIDE 0.9% FLUSH
3.0000 mL | Freq: Two times a day (BID) | INTRAVENOUS | Status: DC
  Administered 2016-05-17 – 2016-05-21 (×7): 3 mL via INTRAVENOUS

## 2016-05-16 MED ORDER — FUROSEMIDE 10 MG/ML IJ SOLN: 40.0000 mg | Freq: Every day | INTRAMUSCULAR | Status: DC

## 2016-05-16 MED ORDER — ONDANSETRON HCL 4 MG/2ML IJ SOLN: 4.0000 mg | Freq: Four times a day (QID) | INTRAMUSCULAR | Status: DC | PRN

## 2016-05-16 MED ORDER — ROPINIROLE HCL 1 MG PO TABS
1.0000 mg | ORAL_TABLET | Freq: Every day | ORAL | Status: DC
  Administered 2016-05-16 – 2016-05-20 (×5): 1 mg via ORAL
  Filled 2016-05-16 (×6): qty 1

## 2016-05-16 MED ORDER — SODIUM CHLORIDE 0.9 % IV SOLN
Freq: Once | INTRAVENOUS | Status: AC
  Administered 2016-05-16: 22:00:00 via INTRAVENOUS

## 2016-05-16 MED ORDER — ACETAMINOPHEN 325 MG PO TABS: 650.0000 mg | ORAL_TABLET | ORAL | Status: DC | PRN

## 2016-05-16 MED ORDER — FUROSEMIDE 10 MG/ML IJ SOLN: 40.0000 mg | Freq: Two times a day (BID) | INTRAMUSCULAR | Status: DC

## 2016-05-16 MED ORDER — APIXABAN 2.5 MG PO TABS
2.5000 mg | ORAL_TABLET | Freq: Two times a day (BID) | ORAL | Status: DC
  Administered 2016-05-16 – 2016-05-21 (×10): 2.5 mg via ORAL
  Filled 2016-05-16 (×10): qty 1

## 2016-05-16 NOTE — ED Provider Notes (Addendum)
[facility] DEPT Provider Note   CSN: [id number] Arrival date & time: 05/16/16  1818     History   Chief Complaint Chief Complaint  Patient presents with  . ankle swelling/possible CHF    HPI Sara [name] is a 82 y.o. female.  This is a 82 year old female with a history of atrial fibrillation, CHF, anxiety, GERD, hiatal hernia, presenting today with worsening peripheral edema.  She was seen by her cardiologist and primary care physician who sent her to the hospital for admission for C HF exacerbation. Patient states she has been taking Lasix 20 mg twice a day for the past 3 weeks, with little to no relief of her edema.  She does state that she is mildly short of breath with exertion.  Denies chest pain.       Past Medical History:  Diagnosis Date  . Ankle edema    Mild, which is intermittent, usually mildly dependent, left slightly greater than the right.  . Anxiety   . Arthritis    In her knees  . Balance problem    Due to weakened knee  . Corneal edema    Severe corneal edema in right eye with multiple folds. Developed after cataract surgery 05/03/09  . GERD (gastroesophageal reflux disease)   . H/O echocardiogram    Echo 03/01/11 EF = >55%. Shows normal systolic function with mild to moderate LV hypertrophy. Left atrial dimension 3.8 cm & a pulmonary artery pressure of 38. There was breakthrough diastolic dysfunction.  . Hematuria    Resolved after coumadin was stopped  . Hiatal hernia    With GERD  . Hyperglycemia    Random, without any history of diabetes  . Hyperlipidemia   . Hypertension    Difficult to control. Renal Doppler 06/02/10 showed less that 60% bilateral renal artery narrowing.  . Hypokalemia   . Paroxysmal atrial fibrillation (HCC)    On amiodarone but not on anticoagulant  . Posthemorrhagic anemia   . Pseudophakia   . Syncope 02/22/11   During OV on 02/22/11 her INR was 7.3 and was given 2.5 mg of vitamin K. Later that day she became  diaphoretic & fainted.  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 Acute on chronic diastolic CHF (congestive heart failure) ([place]) 05/16/2016  . CHF (congestive heart failure) ([place]) 05/16/2016  . Anticoagulated 04/11/2016  . Gait abnormality 04/11/2016  . History of breast cancer 04/11/2016  . Demand ischemia ([place]) 03/30/2016  . Atrial fibrillation with rapid ventricular response ([place]) 03/29/2016  . Paroxysmal atrial fibrillation ([place]) 03/29/2016  . Atypical syncope 03/26/2016  . 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 Obesity (BMI 30-39.9) 03/18/2013  . Essential hypertension 03/18/2013    Past Surgical History:  Procedure Laterality Date  . BREAST BIOPSY    . CARDIOVERSION N/A 03/31/2016   Procedure: CARDIOVERSION;  Surgeon: Jerline Pain, MD;  Location: [place];  Service: Cardiovascular;  Laterality: N/A;  . CATARACT EXTRACTION  05/03/09  . CHOLECYSTECTOMY    . HEMORRHOID SURGERY    . Remote hysterectomy    . TEE WITHOUT CARDIOVERSION N/A 03/31/2016   Procedure: TRANSESOPHAGEAL ECHOCARDIOGRAM (TEE);  Surgeon: Jerline Pain, MD;  Location: [facility] ENDOSCOPY;  Service: Cardiovascular;  Laterality: N/A;  . TONSILLECTOMY    . TOTAL KNEE ARTHROPLASTY Right 08/26/10   By Dr. Elta Guadeloupe C. Yates. Cemented, computer assist    OB History    No  data available       Home Medications    Prior to Admission medications   Medication Sig Start Date End Date Taking? Authorizing Provider  anastrozole (ARIMIDEX) 1 MG tablet Take 1 mg by mouth every morning.    Yes Historical Provider, MD  apixaban (ELIQUIS) 2.5 MG TABS tablet Take 1 tablet (2.5 mg total) by mouth 2 (two) times daily. 04/01/16  Yes Cheryln Manly, NP  Cholecalciferol (VITAMIN D3) 1000 units CAPS Take 1 tablet by mouth daily. 02/24/16  Yes Historical Provider, MD  diltiazem (CARDIZEM CD) 120 MG 24 hr capsule Take 1 capsule (120 mg total) by mouth daily. 04/01/16   Yes Cheryln Manly, NP  furosemide (LASIX) 20 MG tablet Take 20 mg by mouth 2 (two) times daily. 04/25/16  Yes Historical Provider, MD  metoprolol (LOPRESSOR) 50 MG tablet Take 50 mg by mouth 2 (two) times daily with a meal. 04/25/16  Yes Historical Provider, MD  rOPINIRole (REQUIP) 1 MG tablet Take 1 tablet by mouth at bedtime.  02/18/16  Yes Historical Provider, MD    Family History Family History  Problem Relation Age of Onset  . Cancer Mother     Mouth  . Hypertension Mother   . Coronary artery disease Brother   . Hypertension Brother   . Diabetes type II Brother   . Heart attack Sister   . Hypertension Sister     Social History Social History  Substance Use Topics  . Smoking status: Never Smoker  . Smokeless tobacco: Never Used  . Alcohol use No     Allergies   Amiodarone; Benicar [olmesartan]; Bystolic [nebivolol hcl]; Clonidine derivatives; Exforge [amlodipine besylate-valsartan]; Hctz [hydrochlorothiazide]; Hydralazine; Lisinopril; Multaq [dronedarone]; Rythmol [propafenone]; Statins; Tekturna [aliskiren]; Coreg [carvedilol]; and Prednisone   Review of Systems Review of Systems  Constitutional: Negative for fever.  Respiratory: Positive for shortness of breath.   Cardiovascular: Positive for leg swelling. Negative for chest pain and palpitations.  Genitourinary: Negative for dysuria.  Neurological: Negative for dizziness and headaches.  All other systems reviewed and are negative.    Physical Exam Updated Vital Signs BP 117/80   Pulse (!) 121   Temp 97.8 F (36.6 C) (Oral)   Resp 17   Ht 5' (1.524 m)   Wt 69.2 kg   SpO2 98%   BMI 29.78 kg/m   Physical Exam  Constitutional: She is oriented to person, place, and time. She appears well-developed and well-nourished.  HENT:  Head: Normocephalic.  Eyes: Pupils are equal, round, and reactive to light.  Neck: Normal range of motion.  Cardiovascular: Normal rate.  An irregular rhythm present.    Pulmonary/Chest: Effort normal. She exhibits no tenderness.  Abdominal: Soft.  Musculoskeletal: Normal range of motion. She exhibits edema. She exhibits no tenderness.  Neurological: She is alert and oriented to person, place, and time.  Skin: Skin is warm. There is pallor.  Psychiatric: She has a normal mood and affect.  Nursing note and vitals reviewed.    ED Treatments / Results  Labs (all labs ordered are listed, but only abnormal results are displayed) Labs Reviewed  BASIC METABOLIC PANEL - Abnormal; Notable for the following:       Result Value   Potassium 2.7 (*)    Glucose, Bld 133 (*)    GFR calc non Af Amer 56 (*)    All other components within normal limits  CBC - Abnormal; Notable for the following:    Hemoglobin 10.9 (*)  HCT 34.3 (*)    MCH 24.8 (*)    RDW 15.9 (*)    All other components within normal limits  BRAIN NATRIURETIC PEPTIDE - Abnormal; Notable for the following:    B Natriuretic Peptide 362.3 (*)    All other components within normal limits  BASIC METABOLIC PANEL - Abnormal; Notable for the following:    Potassium 3.1 (*)    Chloride 112 (*)    All other components within normal limits  I-STAT TROPOININ, ED    EKG  EKG Interpretation  Date/Time:  Tuesday May 16 2016 18:25:45 EST Ventricular Rate:  102 PR Interval:    QRS Duration: 142 QT Interval:  408 QTC Calculation: 531 R Axis:   48 Text Interpretation:  Atrial fibrillation with rapid ventricular response Right bundle branch block T wave abnormality, consider inferior ischemia Abnormal ECG Confirmed by Alvino Chapel  MD, Ovid Curd [phone]) on 05/16/2016 9:05:10 PM       Radiology Dg Chest 2 View  Result Date: 05/16/2016 CLINICAL DATA:  82 year old female with sensation of fluid build up for 2-3 weeks. Suspected CHF. Initial encounter. EXAM: CHEST  2 VIEW COMPARISON:  03/29/2016 and earlier. FINDINGS: Increased conspicuity of moderate to large gastric hiatal hernia containing an  air-fluid level today. Stable mediastinal contours otherwise, with evidence of cardiomegaly. Mildly increased pulmonary vascularity. New small right pleural effusion. No pneumothorax. No consolidation. Calcified aortic atherosclerosis. Visualized tracheal air column is within normal limits. Osteopenia. Exaggerated thoracic kyphosis. No acute osseous abnormality identified. Stable cholecystectomy clips. IMPRESSION: 1. Mild pulmonary interstitial edema and new small right pleural effusions. 2. Increased conspicuity of moderate to large gastric hiatal hernia, containing an air-fluid level today. 3.  Calcified aortic atherosclerosis. Electronically Signed   By: Genevie Ann M.D.   On: 05/16/2016 19:26    Procedures Procedures (including critical care time)  Medications Ordered in ED Medications  metoprolol tartrate (LOPRESSOR) tablet 50 mg (not administered)  rOPINIRole (REQUIP) tablet 1 mg (1 mg Oral Given 05/16/16 2237)  diltiazem (CARDIZEM CD) 24 hr capsule 120 mg (not administered)  apixaban (ELIQUIS) tablet 2.5 mg (2.5 mg Oral Given 05/16/16 2237)  anastrozole (ARIMIDEX) tablet 1 mg (not administered)  furosemide (LASIX) injection 40 mg (40 mg Intravenous Given 05/16/16 2238)  sodium chloride flush (NS) 0.9 % injection 3 mL (not administered)  sodium chloride flush (NS) 0.9 % injection 3 mL (not administered)  0.9 %  sodium chloride infusion (not administered)  acetaminophen (TYLENOL) tablet 650 mg (not administered)  ondansetron (ZOFRAN) injection 4 mg (not administered)  potassium chloride SA (K-DUR,KLOR-CON) CR tablet 40 mEq (40 mEq Oral Given 05/16/16 2059)  0.9 %  sodium chloride infusion ( Intravenous Stopped 05/17/16 0104)  potassium chloride 30 mEq in sodium chloride 0.9 % 265 mL (KCL MULTIRUN) IVPB (30 mEq Intravenous Given 05/16/16 2240)     Initial Impression / Assessment and Plan / ED Course  I have reviewed the triage vital signs and the nursing notes.  Pertinent labs & imaging  results that were available during my care of the patient were reviewed by me and considered in my medical decision making (see chart for details).  Clinical Course     Patient's potassium is 2.7.  She will need to be hospitalized for supplementation and continued diaphoresis.  Due to the peripheral edema.  Her x-ray shows that she has minimal to mild edema.  A large hiatal hernia.  EKG shows that she has atrial fibrillation with rapid response. Will admit , supplement K+  PO in ED   Final Clinical Impressions(s) / ED Diagnoses   Final diagnoses:  Hypokalemia  Acute on chronic combined systolic and diastolic congestive heart failure [facility])    New Prescriptions New Prescriptions   No medications on file     Junius Creamer, NP 05/16/16 2111    Junius Creamer, NP 05/16/16 2210    Junius Creamer, NP 05/16/16 [id number]    Davonna Belling, MD 05/16/16 2317    Junius Creamer, NP 05/17/16 0543    Davonna Belling, MD 05/17/16 1640

## 2016-05-16 NOTE — ED Triage Notes (Signed)
Patient sent by her primary MD for generalized body swelling for a few weeks. States she states that he thinks she has CHF. Denies CP, denies shortness of breath. Atrial fib on arrival

## 2016-05-16 NOTE — ED Notes (Signed)
Son upset about being sent to the ED by pt's cardiologist and having to wait. Attempted to explain delay to son. Son requested who the on call cardiologist. This RN spoke with the charge RN and informed son of on call cards. Pt taken back to a room

## 2016-05-16 NOTE — H&P (Signed)
History and Physical    Sara [name] [id number] DOB: 1925-12-02 DOA: 05/16/2016   PCP: Townsend Roger, MD Chief Complaint:  Chief Complaint  Patient presents with  . ankle swelling/possible CHF    HPI: Sara [name] is a 82 y.o. female with medical history significant of A.Fib, diastolic CHF with normal EF as of October, GERD, hiatal hernia, very mild amiodarone pulmonary toxicity.  Patient presents to the ED with c/o several week history of worsening peripheral edema.  She has mild DOE as well.  Symptoms onset several weeks ago, started lasix 20mg  PO BID x3 weeks ago and despite this edema has progressed.  ED Course: Very mild CHF.  Review of Systems: As per HPI otherwise 10 point review of systems negative.    Past Medical History:  Diagnosis Date  . Ankle edema    Mild, which is intermittent, usually mildly dependent, left slightly greater than the right.  . Anxiety   . Arthritis    In her knees  . Balance problem    Due to weakened knee  . Corneal edema    Severe corneal edema in right eye with multiple folds. Developed after cataract surgery 05/03/09  . GERD (gastroesophageal reflux disease)   . H/O echocardiogram    Echo 03/01/11 EF = >55%. Shows normal systolic function with mild to moderate LV hypertrophy. Left atrial dimension 3.8 cm & a pulmonary artery pressure of 38. There was breakthrough diastolic dysfunction.  . Hematuria    Resolved after coumadin was stopped  . Hiatal hernia    With GERD  . Hyperglycemia    Random, without any history of diabetes  . Hyperlipidemia   . Hypertension    Difficult to control. Renal Doppler 06/02/10 showed less that 60% bilateral renal artery narrowing.  . Hypokalemia   . Paroxysmal atrial fibrillation (HCC)    On amiodarone but not on anticoagulant  . Posthemorrhagic anemia   . Pseudophakia   . Syncope 02/22/11   During OV on 02/22/11 her INR was 7.3 and was given 2.5 mg of vitamin K. Later that day she became  diaphoretic & fainted.  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 BREAST BIOPSY    . CARDIOVERSION N/A 03/31/2016   Procedure: CARDIOVERSION;  Surgeon: Jerline Pain, MD;  Location: [place] [facility];  Service: Cardiovascular;  Laterality: N/A;  . CATARACT EXTRACTION  05/03/09  . CHOLECYSTECTOMY    . HEMORRHOID SURGERY    . Remote hysterectomy    . TEE WITHOUT CARDIOVERSION N/A 03/31/2016   Procedure: TRANSESOPHAGEAL ECHOCARDIOGRAM (TEE);  Surgeon: Jerline Pain, MD;  Location: [facility] ENDOSCOPY;  Service: Cardiovascular;  Laterality: N/A;  . TONSILLECTOMY    . TOTAL KNEE ARTHROPLASTY Right 08/26/10   By Dr. Elta Guadeloupe C. Yates. Cemented, computer assist     reports that she has never smoked. She has never used smokeless tobacco. She reports that she does not drink alcohol or use drugs.  Allergies  Allergen Reactions  . Amiodarone Shortness Of Breath    Stopped in Feb 2017 secondary to pulmonary complications  . Benicar [Olmesartan]     Unclear of the intolerance  . Bystolic [Nebivolol Hcl] Other (See Comments)    unknown  . Clonidine Derivatives Other (See Comments)    unknown  . Exforge [Amlodipine Besylate-Valsartan] Other (See Comments)    Unclear  . Hctz [Hydrochlorothiazide]     Currently taking without problem; question  if this has to do with hypokalemia  . Hydralazine Other (See Comments)    Also tolerated, but had orthostatic changes on standing medication  . Lisinopril Other (See Comments)    unknown  . Multaq [Dronedarone] Nausea And Vomiting  . Rythmol [Propafenone] Other (See Comments)    unknown  . Statins Other (See Comments)    unknown  . Tekturna [Aliskiren] Other (See Comments)    unknown  . Coreg [Carvedilol] Other (See Comments)    fatigue  . Prednisone     Jittery     Family History  Problem Relation Age of Onset  . Cancer Mother     Mouth  . Hypertension Mother   . Coronary artery disease Brother    . Hypertension Brother   . Diabetes type II Brother   . Heart attack Sister   . Hypertension Sister       Prior to Admission medications   Medication Sig Start Date End Date Taking? Authorizing Provider  anastrozole (ARIMIDEX) 1 MG tablet Take 1 mg by mouth every morning.    Yes Historical Provider, MD  apixaban (ELIQUIS) 2.5 MG TABS tablet Take 1 tablet (2.5 mg total) by mouth 2 (two) times daily. 04/01/16  Yes Cheryln Manly, NP  Cholecalciferol (VITAMIN D3) 1000 units CAPS Take 1 tablet by mouth daily. 02/24/16  Yes Historical Provider, MD  diltiazem (CARDIZEM CD) 120 MG 24 hr capsule Take 1 capsule (120 mg total) by mouth daily. 04/01/16  Yes Cheryln Manly, NP  furosemide (LASIX) 20 MG tablet Take 20 mg by mouth 2 (two) times daily. 04/25/16  Yes Historical Provider, MD  metoprolol (LOPRESSOR) 50 MG tablet Take 50 mg by mouth 2 (two) times daily with a meal. 04/25/16  Yes Historical Provider, MD  rOPINIRole (REQUIP) 1 MG tablet Take 1 tablet by mouth at bedtime.  02/18/16  Yes Historical Provider, MD    Physical Exam: Vitals:   05/16/16 2115 05/16/16 2130 05/16/16 2145 05/16/16 2200  BP: 156/79 147/89 131/86 146/82  Pulse: 75 92 92 (!) 49  Resp: 22 20 15 25   Temp:      TempSrc:      SpO2: 99% 96% 99% 96%  Weight:      Height:          Constitutional: NAD, calm, comfortable Eyes: PERRL, lids and conjunctivae normal ENMT: Mucous membranes are moist. Posterior pharynx clear of any exudate or lesions.Normal dentition.  Neck: normal, supple, no masses, no thyromegaly Respiratory: clear to auscultation bilaterally, no wheezing, no crackles. Normal respiratory effort. No accessory muscle use.  Cardiovascular: Regular rate and rhythm, no murmurs / rubs / gallops. 3+ pitting edema BLE 2+ pedal pulses. No carotid bruits.  Abdomen: no tenderness, no masses palpated. No hepatosplenomegaly. Bowel sounds positive.  Musculoskeletal: no clubbing / cyanosis. No joint deformity upper and  lower extremities. Good ROM, no contractures. Normal muscle tone.  Skin: no rashes, lesions, ulcers. No induration Neurologic: CN 2-12 grossly intact. Sensation intact, DTR normal. Strength 5/5 in all 4.  Psychiatric: Normal judgment and insight. Alert and oriented x 3. Normal mood.    Labs on Admission: I have personally reviewed following labs and imaging studies  CBC:  Recent Labs Lab 05/16/16 1834  WBC 5.7  HGB 10.9*  HCT 34.3*  MCV 78.0  PLT [id number]   Basic Metabolic Panel:  Recent Labs Lab 05/16/16 1834  NA 145  K 2.7*  CL 111  CO2 26  GLUCOSE 133*  BUN 16  CREATININE 0.89  CALCIUM 9.0   GFR: Estimated Creatinine Clearance: 37.9 mL/min (by C-G formula based on SCr of 0.89 mg/dL). Liver Function Tests: No results for input(s): AST, ALT, ALKPHOS, BILITOT, PROT, ALBUMIN in the last 168 hours. No results for input(s): LIPASE, AMYLASE in the last 168 hours. No results for input(s): AMMONIA in the last 168 hours. Coagulation Profile: No results for input(s): INR, PROTIME in the last 168 hours. Cardiac Enzymes: No results for input(s): CKTOTAL, CKMB, CKMBINDEX, TROPONINI in the last 168 hours. BNP (last 3 results) No results for input(s): PROBNP in the last 8760 hours. HbA1C: No results for input(s): HGBA1C in the last 72 hours. CBG: No results for input(s): GLUCAP in the last 168 hours. Lipid Profile: No results for input(s): CHOL, HDL, LDLCALC, TRIG, CHOLHDL, LDLDIRECT in the last 72 hours. Thyroid Function Tests: No results for input(s): TSH, T4TOTAL, FREET4, T3FREE, THYROIDAB in the last 72 hours. Anemia Panel: No results for input(s): VITAMINB12, FOLATE, FERRITIN, TIBC, IRON, RETICCTPCT in the last 72 hours. Urine analysis:    Component Value Date/Time   COLORURINE YELLOW 08/23/2010 1257   APPEARANCEUR CLEAR 08/23/2010 1257   LABSPEC 1.018 08/23/2010 1257   PHURINE 5.5 08/23/2010 1257   GLUCOSEU NEGATIVE 08/23/2010 1257   HGBUR NEGATIVE 08/23/2010 1257     BILIRUBINUR NEGATIVE 08/23/2010 1257   KETONESUR NEGATIVE 08/23/2010 1257   PROTEINUR NEGATIVE 08/23/2010 1257   UROBILINOGEN 0.2 08/23/2010 1257   NITRITE NEGATIVE 08/23/2010 1257   LEUKOCYTESUR  08/23/2010 1257    NEGATIVE MICROSCOPIC NOT DONE ON URINES WITH NEGATIVE PROTEIN, BLOOD, LEUKOCYTES, NITRITE, OR GLUCOSE <1000 mg/dL.   Sepsis Labs: @LABRCNTIP (procalcitonin:4,lacticidven:4) )No results found for this or any previous visit (from the past 240 hour(s)).   Radiological Exams on Admission: Dg Chest 2 View  Result Date: 05/16/2016 CLINICAL DATA:  82 year old female with sensation of fluid build up for 2-3 weeks. Suspected CHF. Initial encounter. EXAM: CHEST  2 VIEW COMPARISON:  03/29/2016 and earlier. FINDINGS: Increased conspicuity of moderate to large gastric hiatal hernia containing an air-fluid level today. Stable mediastinal contours otherwise, with evidence of cardiomegaly. Mildly increased pulmonary vascularity. New small right pleural effusion. No pneumothorax. No consolidation. Calcified aortic atherosclerosis. Visualized tracheal air column is within normal limits. Osteopenia. Exaggerated thoracic kyphosis. No acute osseous abnormality identified. Stable cholecystectomy clips. IMPRESSION: 1. Mild pulmonary interstitial edema and new small right pleural effusions. 2. Increased conspicuity of moderate to large gastric hiatal hernia, containing an air-fluid level today. 3.  Calcified aortic atherosclerosis. Electronically Signed   By: Genevie Ann M.D.   On: 05/16/2016 19:26    EKG: Independently reviewed.  Assessment/Plan Principal Problem:   Acute on chronic diastolic CHF (congestive heart failure) (HCC) Active Problems:   Essential hypertension   Paroxysmal atrial fibrillation (HCC)   CHF (congestive heart failure) ([place])    1. Acute on chronic diastolic CHF - preserved EF on TEE in October 1. CHF pathway 2. Lasix 40mg  IV BID ordered 3. Tele monitor 4. Cant tolerate  ACEi or ARBs 2. A.Fib - 1. Continue apixaban 2. Continue cardizem 3. Continue metoprolol 4. Patient of Dr. Allison Quarry apparently had office visit scheduled for 30th but this got canceled today because they though she would be in hospital.  (May want to call and see if we can un-cancel it vs having cards see patient in hospital). 3. HTN - continue home meds   DVT prophylaxis: Apixaban Code Status: Full Family Communication: No family in room Consults called: None Admission status: Place  in obs   Leean Amezcua, [facility] Pager [phone] from 7PM-7AM  If 7AM-7PM, please contact the day physician for the patient [email or web address] Password [email or web address]  05/16/2016, 10:34 PM

## 2016-05-17 ENCOUNTER — Encounter (HOSPITAL_COMMUNITY): Payer: Self-pay | Admitting: General Practice

## 2016-05-17 DIAGNOSIS — Z833 Family history of diabetes mellitus: Secondary | ICD-10-CM | POA: Diagnosis not present

## 2016-05-17 DIAGNOSIS — Z961 Presence of intraocular lens: Secondary | ICD-10-CM | POA: Diagnosis present

## 2016-05-17 DIAGNOSIS — I5043 Acute on chronic combined systolic (congestive) and diastolic (congestive) heart failure: Secondary | ICD-10-CM | POA: Diagnosis present

## 2016-05-17 DIAGNOSIS — M170 Bilateral primary osteoarthritis of knee: Secondary | ICD-10-CM | POA: Diagnosis present

## 2016-05-17 DIAGNOSIS — Z888 Allergy status to other drugs, medicaments and biological substances status: Secondary | ICD-10-CM | POA: Diagnosis not present

## 2016-05-17 DIAGNOSIS — I5033 Acute on chronic diastolic (congestive) heart failure: Secondary | ICD-10-CM

## 2016-05-17 DIAGNOSIS — F419 Anxiety disorder, unspecified: Secondary | ICD-10-CM | POA: Diagnosis present

## 2016-05-17 DIAGNOSIS — Z96651 Presence of right artificial knee joint: Secondary | ICD-10-CM | POA: Diagnosis present

## 2016-05-17 DIAGNOSIS — I4510 Unspecified right bundle-branch block: Secondary | ICD-10-CM | POA: Diagnosis present

## 2016-05-17 DIAGNOSIS — I480 Paroxysmal atrial fibrillation: Secondary | ICD-10-CM | POA: Diagnosis present

## 2016-05-17 DIAGNOSIS — Z23 Encounter for immunization: Secondary | ICD-10-CM | POA: Diagnosis present

## 2016-05-17 DIAGNOSIS — K449 Diaphragmatic hernia without obstruction or gangrene: Secondary | ICD-10-CM | POA: Diagnosis present

## 2016-05-17 DIAGNOSIS — K219 Gastro-esophageal reflux disease without esophagitis: Secondary | ICD-10-CM | POA: Diagnosis present

## 2016-05-17 DIAGNOSIS — J849 Interstitial pulmonary disease, unspecified: Secondary | ICD-10-CM | POA: Diagnosis present

## 2016-05-17 DIAGNOSIS — T462X5A Adverse effect of other antidysrhythmic drugs, initial encounter: Secondary | ICD-10-CM | POA: Diagnosis present

## 2016-05-17 DIAGNOSIS — E785 Hyperlipidemia, unspecified: Secondary | ICD-10-CM | POA: Diagnosis present

## 2016-05-17 DIAGNOSIS — Z853 Personal history of malignant neoplasm of breast: Secondary | ICD-10-CM | POA: Diagnosis not present

## 2016-05-17 DIAGNOSIS — Z809 Family history of malignant neoplasm, unspecified: Secondary | ICD-10-CM | POA: Diagnosis not present

## 2016-05-17 DIAGNOSIS — Z8249 Family history of ischemic heart disease and other diseases of the circulatory system: Secondary | ICD-10-CM | POA: Diagnosis not present

## 2016-05-17 DIAGNOSIS — Z7901 Long term (current) use of anticoagulants: Secondary | ICD-10-CM | POA: Diagnosis not present

## 2016-05-17 DIAGNOSIS — I10 Essential (primary) hypertension: Secondary | ICD-10-CM | POA: Diagnosis not present

## 2016-05-17 DIAGNOSIS — I110 Hypertensive heart disease with heart failure: Secondary | ICD-10-CM | POA: Diagnosis present

## 2016-05-17 DIAGNOSIS — E876 Hypokalemia: Secondary | ICD-10-CM | POA: Diagnosis present

## 2016-05-17 DIAGNOSIS — Z9849 Cataract extraction status, unspecified eye: Secondary | ICD-10-CM | POA: Diagnosis not present

## 2016-05-17 DIAGNOSIS — H1820 Unspecified corneal edema: Secondary | ICD-10-CM | POA: Diagnosis present

## 2016-05-17 LAB — BASIC METABOLIC PANEL
Anion gap: 9 (ref 5–15)
BUN: 15 mg/dL (ref 6–20)
CHLORIDE: 112 mmol/L — AB (ref 101–111)
CO2: 24 mmol/L (ref 22–32)
Calcium: 9.1 mg/dL (ref 8.9–10.3)
Creatinine, Ser: 0.75 mg/dL (ref 0.44–1.00)
GFR calc Af Amer: >60 mL/min (ref >60)
GFR calc non Af Amer: >60 mL/min (ref >60)
GLUCOSE: 96 mg/dL (ref 65–99)
POTASSIUM: 3.1 mmol/L — AB (ref 3.5–5.1)
Sodium: 145 mmol/L (ref 135–145)

## 2016-05-17 MED ORDER — POTASSIUM CHLORIDE CRYS ER 20 MEQ PO TBCR
40.0000 meq | EXTENDED_RELEASE_TABLET | Freq: Once | ORAL | Status: AC
  Administered 2016-05-17: 40 meq via ORAL
  Filled 2016-05-17: qty 2

## 2016-05-17 MED ORDER — INFLUENZA VAC SPLIT QUAD 0.5 ML IM SUSY
0.5000 mL | PREFILLED_SYRINGE | INTRAMUSCULAR | Status: AC
  Administered 2016-05-18: 0.5 mL via INTRAMUSCULAR

## 2016-05-17 NOTE — Care Management Obs Status (Signed)
[facility] NOTIFICATION   Patient Details  Name: Sara [name] MRN: [id number] Date of Birth: [birth year]   Medicare Observation Status Notification Given:  Yes    CrutchfieldAntony Haste, RN 05/17/2016, 2:23 PM

## 2016-05-17 NOTE — Consult Note (Signed)
Cardiology Consult    Patient ID: Sara [name] MRN: [id number], DOB/AGE: 80-Feb-1927   Admit date: 05/16/2016 Date of Consult: 05/17/2016  Primary Physician: Townsend Roger, MD Reason for Consult: CHF Primary Cardiologist: Dr. Ellyn Hack Requesting Provider: Dr. 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 Recently admitted from 03/29/16-04/01/16 for rapid Afib, had successful TEE/DCCV. Admitted on 05/16/16 with dyspnea and BLE edema, in atrial fib.   History of Present Illness    Sara [name] has a history of PAF and was previously on Amiodarone however, this was discontinued in February 2017 after concerns of lung toxicity after she was admitted to [facility] with BOOP. She also had not previously been on anticoagulation as she frequently has episodes of syncope and collapse.   She has previously been intolerant of Rythmol and Multaq, it is noted in her history that they cause nausea. During her last admission from 10/11-10/14 she was seen in consultation by EP who felt that a LINQ monitor would be indicated. However, the patient was worried about the cost of the monitor and ultimately refused. She underwent TEE/DCCV and had successful conversion to NSR. She did agree to 6 weeks of anticoagulation with Eliquis.   She tells me that about a week or two after she was discharged she developed palpitations, SOB and lower extremity edema. She was seen by Roderic Palau in the Afib clinic on 04/20/16, EKG showed Afib with rate of 108 bpm. The patient felt that her beta blocker and diltiazem were causing her to be SOB and refused any uptitration in her rate control medications. In fact, her metoprolol was decreased to 12.5 mg BID (from 25mg  BID) as patient was adamant that her medications were the cause of her symptoms.   She presented to the ED  on 05/16/16 with lower extremity edema with only mild SOB at rest. BNP was 362, chest x ray showed mild pulmonary interstitial edema. EKG showed atrial fibrillation with rate of 103.     Past Medical History   Past Medical History:  Diagnosis Date  . Ankle edema    Mild, which is intermittent, usually mildly dependent, left slightly greater than the right.  . Anxiety   . Arthritis    In her knees  . Balance problem    Due to weakened knee  . CHF (congestive heart failure) ([place])   . Corneal edema    Severe corneal edema in right eye with multiple folds. Developed after cataract surgery 05/03/09  . GERD (gastroesophageal reflux disease)   . H/O echocardiogram    Echo 03/01/11 EF = >55%. Shows normal systolic function with mild to moderate LV hypertrophy. Left atrial dimension 3.8 cm & a pulmonary artery pressure of 38. There was breakthrough diastolic dysfunction.  . Hematuria    Resolved after coumadin was stopped  . Hiatal hernia    With GERD  . Hyperglycemia    Random, without any history of diabetes  . Hyperlipidemia   . Hypertension    Difficult to control. Renal Doppler 06/02/10 showed less that 60% bilateral renal artery narrowing.  . Hypokalemia   . Paroxysmal atrial fibrillation (HCC)    On amiodarone but not on anticoagulant  . Posthemorrhagic anemia   . Pseudophakia   . Syncope 02/22/11   During OV on 02/22/11 her INR was 7.3  and was given 2.5 mg of vitamin K. Later that day she became diaphoretic & fainted.  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 BREAST BIOPSY    . CARDIOVERSION N/A 03/31/2016   Procedure: CARDIOVERSION;  Surgeon: Jerline Pain, MD;  Location: [place];  Service: Cardiovascular;  Laterality: N/A;  . CATARACT EXTRACTION  05/03/09  . CHOLECYSTECTOMY    . HEMORRHOID SURGERY    . Remote hysterectomy    . TEE WITHOUT CARDIOVERSION N/A 03/31/2016   Procedure: TRANSESOPHAGEAL  ECHOCARDIOGRAM (TEE);  Surgeon: Jerline Pain, MD;  Location[facility] ENDOSCOPY;  Service: Cardiovascular;  Laterality: N/A;  . TONSILLECTOMY    . TOTAL KNEE ARTHROPLASTY Right 08/26/10   By Dr. Elta Guadeloupe C. Yates. Cemented, computer assist     Allergies  Allergies  Allergen Reactions  . Amiodarone Shortness Of Breath    Stopped in Feb 2017 secondary to pulmonary complications  . Benicar [Olmesartan]     Unclear of the intolerance  . Bystolic [Nebivolol Hcl] Other (See Comments)    unknown  . Clonidine Derivatives Other (See Comments)    unknown  . Exforge [Amlodipine Besylate-Valsartan] Other (See Comments)    Unclear  . Hctz [Hydrochlorothiazide]     Currently taking without problem; question if this has to do with hypokalemia  . Hydralazine Other (See Comments)    Also tolerated, but had orthostatic changes on standing medication  . Lisinopril Other (See Comments)    unknown  . Multaq [Dronedarone] Nausea And Vomiting  . Rythmol [Propafenone] Other (See Comments)    unknown  . Statins Other (See Comments)    unknown  . Tekturna [Aliskiren] Other (See Comments)    unknown  . Coreg [Carvedilol] Other (See Comments)    fatigue  . Prednisone     Jittery     Inpatient Medications    . anastrozole  1 mg Oral BH-q7a  . apixaban  2.5 mg Oral BID  . diltiazem  120 mg Oral Daily  . furosemide  40 mg Intravenous BID  . [START ON 05/18/2016] Influenza vac split quadrivalent PF  0.5 mL Intramuscular Tomorrow-1000  . metoprolol  50 mg Oral BID WC  . rOPINIRole  1 mg Oral QHS  . sodium chloride flush  3 mL Intravenous Q12H    Family History    Family History  Problem Relation Age of Onset  . Cancer Mother     Mouth  . Hypertension Mother   . Coronary artery disease Brother   . Hypertension Brother   . Diabetes type II Brother   . Heart attack Sister   . Hypertension Sister     Social History    Social History   Social History  . Marital status: Widowed    Spouse name:  N/A  . Number of children: 1  . Years of education: N/A   Occupational History  .  Retired    Social History Main Topics  . Smoking status: Never Smoker  . Smokeless tobacco: Never Used  . Alcohol use No  . Drug use: No  . Sexual activity: Not on file   Other Topics Concern  . Not on file   Social History Narrative   She is a widowed mother of one and grandmother of one.  She does not exercise.  She does not smoke and does not drink alcohol.     Review of Systems    General:  No chills, fever, night sweats or weight changes.  Cardiovascular:  No chest pain, dyspnea on exertion, + edema, orthopnea, + palpitations, paroxysmal nocturnal dyspnea. Dermatological: No rash, lesions/masses Respiratory: No cough, dyspnea Urologic: No hematuria, dysuria Abdominal:   No nausea, vomiting, diarrhea, bright red blood per rectum, melena, or hematemesis Neurologic:  No visual changes, wkns, changes in mental status. All other systems reviewed and are otherwise negative except as noted above.  Physical Exam    Blood pressure 124/75, pulse 90, temperature 97.8 F (36.6 C), temperature source Oral, resp. rate (!) 27, height 5' (1.524 m), weight 149 lb 11.2 oz (67.9 kg), SpO2 91 %.  General: Pleasant, elderly female in NAD Psych: Normal affect.  Neuro: Alert and oriented X 3. Moves all extremities spontaneously. HEENT: Normal  Neck: Supple without bruits or JVD. Lungs:  Resp regular and unlabored, CTA. Heart: iRRR no s3, s4, or murmurs. Abdomen: Soft, non-tender, non-distended, BS + x 4.  Extremities: No clubbing, cyanosis or edema. DP/PT/Radials 2+ and equal bilaterally.  Labs    Troponin [facility] Test)  Recent Labs  05/16/16 1854  TROPIPOC 0.00    Lab Results  Component Value Date   WBC 5.7 05/16/2016   HGB 10.9 (L) 05/16/2016   HCT 34.3 (L) 05/16/2016   MCV 78.0 05/16/2016   PLT 284 05/16/2016    Recent Labs Lab 05/17/16 0332  NA 145  K 3.1*  CL 112*  CO2 24   BUN 15  CREATININE 0.75  CALCIUM 9.1  GLUCOSE 96     Radiology Studies    Dg Chest 2 View  Result Date: 05/16/2016 CLINICAL DATA:  82 year old female with sensation of fluid build up for 2-3 weeks. Suspected CHF. Initial encounter. EXAM: CHEST  2 VIEW COMPARISON:  03/29/2016 and earlier. FINDINGS: Increased conspicuity of moderate to large gastric hiatal hernia containing an air-fluid level today. Stable mediastinal contours otherwise, with evidence of cardiomegaly. Mildly increased pulmonary vascularity. New small right pleural effusion. No pneumothorax. No consolidation. Calcified aortic atherosclerosis. Visualized tracheal air column is within normal limits. Osteopenia. Exaggerated thoracic kyphosis. No acute osseous abnormality identified. Stable cholecystectomy clips. IMPRESSION: 1. Mild pulmonary interstitial edema and new small right pleural effusions. 2. Increased conspicuity of moderate to large gastric hiatal hernia, containing an air-fluid level today. 3.  Calcified aortic atherosclerosis. Electronically Signed   By: Genevie Ann M.D.   On: 05/16/2016 19:26    EKG & Cardiac Imaging    EKG: Afib, RBBB rate 103.   Echocardiogram: 03/30/16 Study Conclusions  - Left ventricle: The cavity size was normal. Wall thickness was   increased in a pattern of mild LVH. Systolic function was   vigorous. The estimated ejection fraction was in the range of 65%   to 70%. Wall motion was normal; there were no regional wall   motion abnormalities. Doppler parameters are consistent with high   ventricular filling pressure. - Aortic valve: There was trivial regurgitation. - Mitral valve: Calcified annulus. Mildly thickened leaflets . - Left atrium: The atrium was severely dilated. - Right atrium: The atrium was mildly dilated. - Pulmonary arteries: Systolic pressure was mildly increased. PA   peak pressure: 37 mm Hg (S).  Impressions:  - Vigorous LV systolic function with intracavitary  gradient of 1.7   m/s; mild LVH; elevated LV filling pressure; biatrial   enlargement; trace AI; mild TR with mildly elevated pulmonary   pressure.  Assessment & Plan    1. Paroxsymal atrial fibrillation: patient presents with  palpitations, SOB and bilateral lower extremity edema. 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 She has previously been intolerant to rythmol and Multaq. Also, noted to have prolonged QTc at .531, but has a RBBB so likely that her QTc is falsely prolonged but still not a candidate for Tikosyn. Left atrium severely dilated by Echo last month.   Also, refused LINQ last admission. Currently on po diltiazem 120mg  and Eliquis 2.5mg . She was on 25mg  metoprolol when she was seen in the Afib clinic however, this was reduced per patient request. Now on 50mg  metoprolol BID while admitted and has rates in the 90's - 100's with very short runs of rapid Afib in the 120 bpm range.   She is likely asymptomatic with the proper amount of rate control, but the challenge is her compliance to medications as I suspect there is an aspect of dementia here.    Further MD recommendations to follow.   2. Chronic diastolic CHF: Continue IV diuresis, likely can transition to po tomorrow.   3. History of interstitial lung disease: Followed by Pulmonology, Dr. Melvyn Novas has seen her recently, she is stable.   4. HTN: BP stable.   Signed, Arbutus Leas, NP 05/17/2016, 2:08 PM Pager: [phone]  I have seen and examined this patient with Jettie Booze.  Agree with above, note added to reflect my findings.  On exam, irregular rhythm, no murmurs, lungs clear, 2+LE edema.   Presented to the hospital with SOB and LE edema.  Currently being diuresed with AF and at times, elevated HR. She does have quite a bit more diuresis to go. Her rate is well controlled currently between 90-110. Would continue her beta blocker and diltiazem and increase if necessary, which was discussed  with the patient. Also discussed that it is unlikely that her HF symptoms were caused by her rate control for AF. Canna Nickelson have her follow up in clinic for further discussion of rate control in AF. She does appear to have a long QT but this number could be inflated due to her RBBB and AF.  Ignatius Kloos M. Genelle Economou MD 05/17/2016 5:14 PM

## 2016-05-17 NOTE — Progress Notes (Signed)
PROGRESS NOTE  Sara [name] [id number] DOB: Oct 23, 1925 DOA: 05/16/2016 PCP: Townsend Roger, MD   LOS: 0 days   Brief Narrative: 82 y.o. female with medical history significant of A.Fib, diastolic CHF with normal EF as of October, GERD, hiatal hernia, very mild amiodarone pulmonary toxicity.  Patient presents to the ED with c/o several week history of worsening peripheral edema.  She has mild DOE as well.  Symptoms onset several weeks ago, started lasix 20mg  PO BID x3 weeks ago and despite this edema has progressed.  Assessment & Plan: Principal Problem:   Acute on chronic diastolic CHF (congestive heart failure) (HCC) Active Problems:   Essential hypertension   Paroxysmal atrial fibrillation (HCC)   CHF (congestive heart failure) (HCC)   A fib with RVR - patient complains of poorly controlled rates at home, she is status post TEE with cardioversion about 1-1/2 months ago, and noted to be back in A fib in outpatient follow up - in the ER rates vary from 100 to 150 / min - continue diltiazem and Metoprolol, diuresis - cardiology consulted - continue Eliquis  Acute on chronic diastolic CHF - Lasix 40 IV BID  Amiodarone induced pulmonary toxicity  - Patient was initially on amiodarone for her atrial fibrillation, however she was admitted from the hospital recently with BOOP, and her amiodarone was discontinued in February 2017 - stable, seeing pulmonology as an outpatient  HTN - continue home medications, BP controlled   Hypokalemia - Needs ongoing repletion   DVT prophylaxis: on Eliquis Code Status: Full Family Communication: no family bedside Disposition Plan: home when ready  Consultants:   Cardiology   Procedures:   None   Antimicrobials:  None    Subjective: - no chest pain, no abdominal pain, nausea or vomiting. Mild dyspnea.   Objective: Vitals:   05/17/16 1130 05/17/16 1145 05/17/16 1200 05/17/16 1307  BP: 113/59 121/79 120/84 124/75    Pulse: 104 83 120 (!) 108  Resp: 15 25 21 20   Temp:    97.8 F (36.6 C)  TempSrc:    Oral  SpO2: 95% 98% 97% 94%  Weight:    67.9 kg (149 lb 11.2 oz)  Height:    5' (1.524 m)    Intake/Output Summary (Last 24 hours) at 05/17/16 1359 Last data filed at 05/17/16 1343  Gross per 24 hour  Intake               60 ml  Output              960 ml  Net             -900 ml   Filed Weights   05/16/16 1831 05/17/16 1307  Weight: 69.2 kg (152 lb 8 oz) 67.9 kg (149 lb 11.2 oz)    Examination: Constitutional: NAD, appears tachypneic Vitals:   05/17/16 1130 05/17/16 1145 05/17/16 1200 05/17/16 1307  BP: 113/59 121/79 120/84 124/75  Pulse: 104 83 120 (!) 108  Resp: 15 25 21 20   Temp:    97.8 F (36.6 C)  TempSrc:    Oral  SpO2: 95% 98% 97% 94%  Weight:    67.9 kg (149 lb 11.2 oz)  Height:    5' (1.524 m)   Eyes: PERRL ENMT: Mucous membranes are moist.  Respiratory: mild bibasilar crackles Cardiovascular: irregular, no NRG. 1-2+ LE edema. 2+ pedal pulses. Abdomen: no tenderness. Bowel sounds positive.  Musculoskeletal: no clubbing / cyanosis.  Skin: no rashes, lesions, ulcers.  No induration Neurologic: non focal   Psychiatric: Normal judgment and insight. Alert and oriented x 3. Normal mood.    Data Reviewed: I have personally reviewed following labs and imaging studies  CBC:  Recent Labs Lab 05/16/16 1834  WBC 5.7  HGB 10.9*  HCT 34.3*  MCV 78.0  PLT [id number]   Basic Metabolic Panel:  Recent Labs Lab 05/16/16 1834 05/17/16 0332  NA 145 145  K 2.7* 3.1*  CL 111 112*  CO2 26 24  GLUCOSE 133* 96  BUN 16 15  CREATININE 0.89 0.75  CALCIUM 9.0 9.1   Urine analysis:    Component Value Date/Time   COLORURINE YELLOW 08/23/2010 1257   APPEARANCEUR CLEAR 08/23/2010 1257   LABSPEC 1.018 08/23/2010 1257   PHURINE 5.5 08/23/2010 [place] 08/23/2010 1257   [place] 08/23/2010 [place] 08/23/2010 1257   KETONESUR NEGATIVE  08/23/2010 1257   PROTEINUR NEGATIVE 08/23/2010 1257   UROBILINOGEN 0.2 08/23/2010 1257   NITRITE NEGATIVE 08/23/2010 1257   LEUKOCYTESUR  08/23/2010 1257    NEGATIVE MICROSCOPIC NOT DONE ON URINES WITH NEGATIVE PROTEIN, BLOOD, LEUKOCYTES, NITRITE, OR GLUCOSE <1000 mg/dL.   Sepsis Labs: Invalid input(s): PROCALCITONIN, LACTICIDVEN  No results found for this or any previous visit (from the past 240 hour(s)).    Radiology Studies: Dg Chest 2 View  Result Date: 05/16/2016 CLINICAL DATA:  82 year old female with sensation of fluid build up for 2-3 weeks. Suspected CHF. Initial encounter. EXAM: CHEST  2 VIEW COMPARISON:  03/29/2016 and earlier. FINDINGS: Increased conspicuity of moderate to large gastric hiatal hernia containing an air-fluid level today. Stable mediastinal contours otherwise, with evidence of cardiomegaly. Mildly increased pulmonary vascularity. New small right pleural effusion. No pneumothorax. No consolidation. Calcified aortic atherosclerosis. Visualized tracheal air column is within normal limits. Osteopenia. Exaggerated thoracic kyphosis. No acute osseous abnormality identified. Stable cholecystectomy clips. IMPRESSION: 1. Mild pulmonary interstitial edema and new small right pleural effusions. 2. Increased conspicuity of moderate to large gastric hiatal hernia, containing an air-fluid level today. 3.  Calcified aortic atherosclerosis. Electronically Signed   By: Genevie Ann M.D.   On: 05/16/2016 19:26     Scheduled Meds: . anastrozole  1 mg Oral BH-q7a  . apixaban  2.5 mg Oral BID  . diltiazem  120 mg Oral Daily  . furosemide  40 mg Intravenous BID  . [START ON 05/18/2016] Influenza vac split quadrivalent PF  0.5 mL Intramuscular Tomorrow-1000  . metoprolol  50 mg Oral BID WC  . rOPINIRole  1 mg Oral QHS  . sodium chloride flush  3 mL Intravenous Q12H   Continuous Infusions:  Marzetta Board, MD, PhD [facility] Pager [phone]  If 7PM-7AM, please contact  night-coverage [email or web address] Password [email or web address] 05/17/2016, 1:59 PM

## 2016-05-17 NOTE — ED Notes (Signed)
Hospitalists in room with patient.

## 2016-05-17 NOTE — Evaluation (Addendum)
Physical Therapy Evaluation Patient Details Name: Setayesh Alcide MRN: [id number] DOB: 1925/11/28 Today's Date: 05/17/2016   History of Present Illness  Patient is a 82 y/o female with hx of HTN, HLD, CHF, paroxysmal A-fib recently admitted from 03/29/16-04/01/16 for rapid Afib, had successful TEE/DCCV presents with dyspnea, BLE edema and atrial fib.   Clinical Impression  Patient presents with generalized weakness s/p above impacting mobility. Tolerated gait training with Min guard assist for safety. HR up to 139 bpm in A-fib during activity. Pt independent PTA and highly motivated to mobilize while in hospital to maintain strength. Pt's son lives with her but he works during the day. Encouraged daily mobility with RN. Will follow acutely to maximize independence and mobility prior to return home.     Follow Up Recommendations Home health PT;Supervision - Intermittent    Equipment Recommendations  None recommended by PT    Recommendations for Other Services OT consult     Precautions / Restrictions Precautions Precautions: Fall Precaution Comments: watch HR Restrictions Weight Bearing Restrictions: No      Mobility  Bed Mobility Overal bed mobility: Needs Assistance Bed Mobility: Supine to Sit     Supine to sit: Supervision;HOB elevated     General bed mobility comments: No assist needed. Use of rail.  Transfers Overall transfer level: Needs assistance Equipment used: Rolling walker (2 wheeled) Transfers: Sit to/from Stand Sit to Stand: Min guard         General transfer comment: Min guard for safety. Stood fromE [employer], transferred to chair post ambulation bout.  Ambulation/Gait Ambulation/Gait assistance: Min guard Ambulation Distance (Feet): 140 Feet Assistive device: Rolling walker (2 wheeled) Gait Pattern/deviations: Step-through pattern;Step-to pattern;Decreased stride length;Trunk flexed Gait velocity: decreased   General Gait Details: Slow, mostly  steady gait with weakness in BLEs. HR up to 139 bpm in A-fib. Sp02 >95% on RA.   Stairs            Wheelchair Mobility    Modified Rankin (Stroke Patients Only)       Balance Overall balance assessment: Needs assistance Sitting-balance support: Feet supported;No upper extremity supported Sitting balance-Leahy Scale: Good Sitting balance - Comments: Able to bring LE up on thigh to donn sock- difficulty due to swelling.   Standing balance support: During functional activity Standing balance-Leahy Scale: Poor Standing balance comment: Reliant on BUEs for support in standing.                             Pertinent Vitals/Pain Pain Assessment: No/denies pain    Home Living Family/patient expects to be discharged to:: Private residence Living Arrangements: Children (with son) Available Help at Discharge: Family;Available PRN/intermittently Type of Home: House Home Access: Stairs to enter Entrance Stairs-Rails: None Entrance Stairs-Number of Steps: 3 Home Layout: One level Home Equipment: Walker - 2 wheels;Cane - single point;Bedside commode      Prior Function Level of Independence: Independent with assistive device(s)         Comments: Uses RW for community ambulation; independent for household. Cooks, cleans, did Thanksgiving dinner. Son works during the day, no falls in last 6 months.     Hand Dominance        Extremity/Trunk Assessment   Upper Extremity Assessment: Defer to OT evaluation           Lower Extremity Assessment: Generalized weakness         Communication   Communication: HOH  Cognition Arousal/Alertness: Awake/alert  Behavior During Therapy: WFL for tasks assessed/performed Overall Cognitive Status: Within Functional Limits for tasks assessed                      General Comments      Exercises     Assessment/Plan    PT Assessment Patient needs continued PT services  PT Problem List Decreased  strength;Decreased mobility;Decreased balance;Decreased activity tolerance;Cardiopulmonary status limiting activity          PT Treatment Interventions DME instruction;Therapeutic activities;Gait training;Therapeutic exercise;Patient/family education;Balance training;Functional mobility training;Stair training    PT Goals (Current goals can be found in the Care Plan section)  Acute Rehab PT Goals Patient Stated Goal: to get stronger to go home PT Goal Formulation: With patient Time For Goal Achievement: 05/31/16 Potential to Achieve Goals: Good    Frequency Min 3X/week   Barriers to discharge Decreased caregiver support alone during the day    Co-evaluation               End of Session Equipment Utilized During Treatment: Gait belt Activity Tolerance: Patient tolerated treatment well Patient left: in chair;with call bell/phone within reach Nurse Communication: Mobility status    Functional Assessment Tool Used: clinical judgment Functional Limitation: Mobility: Walking and moving around Mobility: Walking and Moving Around Current Status [phone]): At least 1 percent but less than 20 percent impaired, limited or restricted Mobility: Walking and Moving Around Goal Status [phone]): At least 1 percent but less than 20 percent impaired, limited or restricted    Time: [id number] PT Time Calculation (min) (ACUTE ONLY): 24 min   Charges:   PT Evaluation $PT Eval Low Complexity: 1 Procedure PT Treatments $Gait Training: 8-22 mins   PT G Codes:   PT G-Codes **NOT FOR INPATIENT CLASS** Functional Assessment Tool Used: clinical judgment Functional Limitation: Mobility: Walking and moving around Mobility: Walking and Moving Around Current Status [id number]): At least 1 percent but less than 20 percent impaired, limited or restricted Mobility: Walking and Moving Around Goal Status [phone]): At least 1 percent but less than 20 percent impaired, limited or restricted    [place] 05/17/2016, 4:18 PM  Wray Kearns, [place], DPT [phone]

## 2016-05-17 NOTE — ED Notes (Signed)
Report Called to 3w.

## 2016-05-17 NOTE — ED Notes (Signed)
Son called and spoke to this RN on the telephone and upset that is mom is still in emergency room. This RN explained delay in bed availability at this time.

## 2016-05-17 NOTE — ED Notes (Signed)
Heart Healthy Diet was ordered for Lunch. 

## 2016-05-17 NOTE — ED Notes (Signed)
breakfast tray ordered - ty

## 2016-05-17 NOTE — Care Management Note (Signed)
Case Management Note  Patient Details  Name: Sara [name] MRN: [id number] Date of Birth: 02/13/1926  Subjective/Objective: 82 y.o. F admitted from home where she lives with son in private residence. Tells me she has RW that she uses outside for additional assistance. Has used [facility] in the past for [facility]. Will order PT eval at the suggestion of bedside RN.                    Action/Plan: Will continue to follow.    Expected Discharge Date:                  Expected Discharge Plan:  Home/Self Care  In-House Referral:  NA  Discharge planning Services  CM Consult  Post Acute Care Choice:  Home Health Choice offered to:  Patient  DME Arranged:   (PT pending) DME Agency:     HH Arranged:  RN (Resume ) [place]:  [place]  Status of Service:  In process, will continue to follow  If discussed at Long Length of Stay Meetings, dates discussed:    Additional Comments:  [name], RN 05/17/2016, 2:27 PM

## 2016-05-18 ENCOUNTER — Institutional Professional Consult (permissible substitution): Payer: Commercial Managed Care - HMO | Admitting: Pulmonary Disease

## 2016-05-18 ENCOUNTER — Ambulatory Visit: Payer: Commercial Managed Care - HMO | Admitting: Cardiology

## 2016-05-18 DIAGNOSIS — I10 Essential (primary) hypertension: Secondary | ICD-10-CM

## 2016-05-18 LAB — BASIC METABOLIC PANEL
Anion gap: 8 (ref 5–15)
BUN: 14 mg/dL (ref 6–20)
CALCIUM: 9.1 mg/dL (ref 8.9–10.3)
CHLORIDE: 109 mmol/L (ref 101–111)
CO2: 25 mmol/L (ref 22–32)
CREATININE: 0.75 mg/dL (ref 0.44–1.00)
GFR calc non Af Amer: >60 mL/min (ref >60)
GLUCOSE: 100 mg/dL — AB (ref 65–99)
Potassium: 3.5 mmol/L (ref 3.5–5.1)
Sodium: 142 mmol/L (ref 135–145)

## 2016-05-18 MED ORDER — ALPRAZOLAM 0.25 MG PO TABS
0.2500 mg | ORAL_TABLET | Freq: Two times a day (BID) | ORAL | Status: DC | PRN
  Administered 2016-05-18 – 2016-05-20 (×4): 0.25 mg via ORAL
  Filled 2016-05-18 (×4): qty 1

## 2016-05-18 NOTE — Progress Notes (Signed)
PROGRESS NOTE  Sara [name] [id number] DOB: 03/20/1926 DOA: 05/16/2016 PCP: Townsend Roger, MD   LOS: 1 day   Brief Narrative: 82 y.o. female with medical history significant of A.Fib, diastolic CHF with normal EF as of October, GERD, hiatal hernia, very mild amiodarone pulmonary toxicity.  Patient presents to the ED with c/o several week history of worsening peripheral edema.  She has mild DOE as well.  Symptoms onset several weeks ago, started lasix 20mg  PO BID x3 weeks ago and despite this edema has progressed.  Assessment & Plan: Principal Problem:   Acute on chronic diastolic CHF (congestive heart failure) (HCC) Active Problems:   Essential hypertension   Paroxysmal atrial fibrillation (HCC)   CHF (congestive heart failure) (HCC)   A fib with RVR - patient complains of poorly controlled rates at home, she is status post TEE with cardioversion about 1-1/2 months ago, and noted to be back in A fib in outpatient follow up - continue diltiazem and Metoprolol, diuresis with IV Lasix - cardiology consulted, appreciate input - continue Eliquis  Acute on chronic diastolic CHF - Lasix 40 IV BID, net negative -680, weight 152 >> 150  Amiodarone induced pulmonary toxicity  - Patient was initially on amiodarone for her atrial fibrillation, however she was admitted from the hospital recently with BOOP, and her amiodarone was discontinued in February 2017 - stable, seeing pulmonology as an outpatient  HTN - continue home medications, BP controlled   Hypokalemia - Needs ongoing repletion   DVT prophylaxis: on Eliquis Code Status: Full Family Communication: no family bedside Disposition Plan: home when ready  Consultants:   Cardiology   Procedures:   None   Antimicrobials:  None    Subjective: - could not sleep all night long. No chest pain. Still short of breath   Objective: Vitals:   05/18/16 0700 05/18/16 0800 05/18/16 0900 05/18/16 1000  BP:        Pulse: 90 (!) 135 (!) 108 (!) 132  Resp: 20 (!) 28 14 (!) 27  Temp:      TempSrc:      SpO2: 95% 95% 96% 97%  Weight:      Height:        Intake/Output Summary (Last 24 hours) at 05/18/16 1118 Last data filed at 05/18/16 1040  Gross per 24 hour  Intake              420 ml  Output             1100 ml  Net             -680 ml   Filed Weights   05/16/16 1831 05/17/16 1307 05/18/16 0550  Weight: 69.2 kg (152 lb 8 oz) 67.9 kg (149 lb 11.2 oz) 68.1 kg (150 lb 1.6 oz)    Examination: Constitutional: NAD, appears tachypneic Vitals:   05/18/16 0700 05/18/16 0800 05/18/16 0900 05/18/16 1000  BP:      Pulse: 90 (!) 135 (!) 108 (!) 132  Resp: 20 (!) 28 14 (!) 27  Temp:      TempSrc:      SpO2: 95% 95% 96% 97%  Weight:      Height:       Eyes: PERRL ENMT: Mucous membranes are moist.  Respiratory: mild bibasilar crackles Cardiovascular: irregular, no NRG. 1-2+ LE edema. 2+ pedal pulses. Abdomen: no tenderness. Bowel sounds positive.  Musculoskeletal: no clubbing / cyanosis.  Skin: no rashes, lesions, ulcers. No induration Neurologic: non  focal   Psychiatric: Normal judgment and insight. Alert and oriented x 3. Normal mood.    Data Reviewed: I have personally reviewed following labs and imaging studies  CBC:  Recent Labs Lab 05/16/16 1834  WBC 5.7  HGB 10.9*  HCT 34.3*  MCV 78.0  PLT [id number]   Basic Metabolic Panel:  Recent Labs Lab 05/16/16 1834 05/17/16 0332 05/18/16 0256  NA 145 145 142  K 2.7* 3.1* 3.5  CL 111 112* 109  CO2 26 24 25   GLUCOSE 133* 96 100*  BUN 16 15 14   CREATININE 0.89 0.75 0.75  CALCIUM 9.0 9.1 9.1   Urine analysis:    Component Value Date/Time   COLORURINE YELLOW 08/23/2010 [place] 08/23/2010 1257   LABSPEC 1.018 08/23/2010 1257   PHURINE 5.5 08/23/2010 1257   GLUCOSEU NEGATIVE 08/23/2010 1257   HGBUR NEGATIVE 08/23/2010 1257   [place] 08/23/2010 1257   KETONESUR NEGATIVE 08/23/2010 1257    PROTEINUR NEGATIVE 08/23/2010 1257   UROBILINOGEN 0.2 08/23/2010 1257   NITRITE NEGATIVE 08/23/2010 1257   LEUKOCYTESUR  08/23/2010 1257    NEGATIVE MICROSCOPIC NOT DONE ON URINES WITH NEGATIVE PROTEIN, BLOOD, LEUKOCYTES, NITRITE, OR GLUCOSE <1000 mg/dL.   Sepsis Labs: Invalid input(s): PROCALCITONIN, LACTICIDVEN  No results found for this or any previous visit (from the past 240 hour(s)).    Radiology Studies: Dg Chest 2 View  Result Date: 05/16/2016 CLINICAL DATA:  82 year old female with sensation of fluid build up for 2-3 weeks. Suspected CHF. Initial encounter. EXAM: CHEST  2 VIEW COMPARISON:  03/29/2016 and earlier. FINDINGS: Increased conspicuity of moderate to large gastric hiatal hernia containing an air-fluid level today. Stable mediastinal contours otherwise, with evidence of cardiomegaly. Mildly increased pulmonary vascularity. New small right pleural effusion. No pneumothorax. No consolidation. Calcified aortic atherosclerosis. Visualized tracheal air column is within normal limits. Osteopenia. Exaggerated thoracic kyphosis. No acute osseous abnormality identified. Stable cholecystectomy clips. IMPRESSION: 1. Mild pulmonary interstitial edema and new small right pleural effusions. 2. Increased conspicuity of moderate to large gastric hiatal hernia, containing an air-fluid level today. 3.  Calcified aortic atherosclerosis. Electronically Signed   By: Genevie Ann M.D.   On: 05/16/2016 19:26     Scheduled Meds: . anastrozole  1 mg Oral BH-q7a  . apixaban  2.5 mg Oral BID  . diltiazem  120 mg Oral Daily  . furosemide  40 mg Intravenous BID  . metoprolol  50 mg Oral BID WC  . rOPINIRole  1 mg Oral QHS  . sodium chloride flush  3 mL Intravenous Q12H   Continuous Infusions:  Marzetta Board, MD, PhD [facility] Pager [phone]  If 7PM-7AM, please contact night-coverage [email or web address] Password [email or web address] 05/18/2016, 11:18 AM

## 2016-05-18 NOTE — Progress Notes (Signed)
Patient Name: Sara [name] Date of Encounter: 05/18/2016  Primary Cardiologist: Dr. Beckie Salts Problem List     Principal Problem:   Acute on chronic diastolic CHF (congestive heart failure) ([place]) Active Problems:   Essential hypertension   Paroxysmal atrial fibrillation (HCC)   CHF (congestive heart failure) (HCC)     Subjective   Anxious, says she did not sleep at all. Denies chest pain, is feeling SOB.   Inpatient Medications    Scheduled Meds: . anastrozole  1 mg Oral BH-q7a  . apixaban  2.5 mg Oral BID  . diltiazem  120 mg Oral Daily  . furosemide  40 mg Intravenous BID  . Influenza vac split quadrivalent PF  0.5 mL Intramuscular Tomorrow-1000  . metoprolol  50 mg Oral BID WC  . rOPINIRole  1 mg Oral QHS  . sodium chloride flush  3 mL Intravenous Q12H   Continuous Infusions:  PRN Meds: sodium chloride, acetaminophen, ondansetron (ZOFRAN) IV, sodium chloride flush   Vital Signs    Vitals:   05/17/16 1700 05/17/16 1718 05/17/16 2140 05/18/16 0550  BP:  137/84 116/76 139/88  Pulse: (!) 128 (!) 148 (!) 58 (!) 54  Resp: 20 (!) 22 17 (!) 23  Temp:   98.8 F (37.1 C) 97.8 F (36.6 C)  TempSrc:   Oral Oral  SpO2: 99% 100% 95% 95%  Weight:    150 lb 1.6 oz (68.1 kg)  Height:        Intake/Output Summary (Last 24 hours) at 05/18/16 0736 Last data filed at 05/18/16 0500  Gross per 24 hour  Intake              180 ml  Output              750 ml  Net             -570 ml   Filed Weights   05/16/16 1831 05/17/16 1307 05/18/16 0550  Weight: 152 lb 8 oz (69.2 kg) 149 lb 11.2 oz (67.9 kg) 150 lb 1.6 oz (68.1 kg)    Physical Exam   GEN: Well nourished, well developed, elderly female  in no acute distress.  HEENT: Grossly normal.  Neck: Supple, no JVD, carotid bruits, or masses. Cardiac: iRRR, no murmurs, rubs, or gallops. No clubbing, cyanosis. 1+ pretibial edema.  Radials/DP/PT 2+ and equal bilaterally.  Respiratory:  Respirations regular and  unlabored, clear to auscultation bilaterally. GI: Soft, nontender, nondistended, BS + x 4. MS: no deformity or atrophy. Skin: warm and dry, no rash. Neuro:  Strength and sensation are intact. Psych: AAOx3.  Normal affect.  Labs    CBC  Recent Labs  05/16/16 1834  WBC 5.7  HGB 10.9*  HCT 34.3*  MCV 78.0  PLT [id number]   Basic Metabolic Panel  Recent Labs  05/17/16 0332 05/18/16 0256  NA 145 142  K 3.1* 3.5  CL 112* 109  CO2 24 25  GLUCOSE 96 100*  BUN 15 14  CREATININE 0.75 0.75  CALCIUM 9.1 9.1     Telemetry    Afib, mostly rate controlled with rates in 90's.  - Personally Reviewed    Radiology    Dg Chest 2 View  Result Date: 05/16/2016 CLINICAL DATA:  82 year old female with sensation of fluid build up for 2-3 weeks. Suspected CHF. Initial encounter. EXAM: CHEST  2 VIEW COMPARISON:  03/29/2016 and earlier. FINDINGS: Increased conspicuity of moderate to large gastric hiatal hernia containing an  air-fluid level today. Stable mediastinal contours otherwise, with evidence of cardiomegaly. Mildly increased pulmonary vascularity. New small right pleural effusion. No pneumothorax. No consolidation. Calcified aortic atherosclerosis. Visualized tracheal air column is within normal limits. Osteopenia. Exaggerated thoracic kyphosis. No acute osseous abnormality identified. Stable cholecystectomy clips. IMPRESSION: 1. Mild pulmonary interstitial edema and new small right pleural effusions. 2. Increased conspicuity of moderate to large gastric hiatal hernia, containing an air-fluid level today. 3.  Calcified aortic atherosclerosis. Electronically Signed   By: Genevie Ann M.D.   On: 05/16/2016 19:26    Cardiac Studies  Transthoracic Echocardiography 03/30/16 Study Conclusions  - Left ventricle: The cavity size was normal. Wall thickness was   increased in a pattern of mild LVH. Systolic function was   vigorous. The estimated ejection fraction was in the range of 65%   to 70%.  Wall motion was normal; there were no regional wall   motion abnormalities. Doppler parameters are consistent with high   ventricular filling pressure. - Aortic valve: There was trivial regurgitation. - Mitral valve: Calcified annulus. Mildly thickened leaflets . - Left atrium: The atrium was severely dilated. - Right atrium: The atrium was mildly dilated. - Pulmonary arteries: Systolic pressure was mildly increased. PA   peak pressure: 37 mm Hg (S).  Impressions:  - Vigorous LV systolic function with intracavitary gradient of 1.7   m/s; mild LVH; elevated LV filling pressure; biatrial   enlargement; trace AI; mild TR with mildly elevated pulmonary   pressure.   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 Recently admitted from 03/29/16-04/01/16 for rapid Afib, had successful TEE/DCCV. Admitted on 05/16/16 with dyspnea and BLE edema, in atrial fib.      Assessment & Plan    1. Paroxsymal atrial fibrillation: patient presents with palpitations, SOB and bilateral lower extremity edema. 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 She has previously been intolerant to rythmol and Multaq. Also, noted to have prolonged QTc at .531, but has a RBBB so likely that her QTc is falsely prolonged but still not a candidate for Tikosyn. Left atrium severely dilated by Echo last month.   Also, refused LINQ last admission. Currently on po diltiazem 120mg  and Eliquis 2.5mg . She was on 25mg  metoprolol when she was seen in the Afib clinic however, this was reduced per patient request. Now on 50mg  metoprolol BID while admitted and has rates in the 90's - 100's with very short runs of rapid Afib in the 120 bpm range.   She is likely asymptomatic with the proper amount of rate control, but the challenge is her compliance to medications as I  suspect there is an aspect of dementia here.    EP has seen and recommends to continue her beta blocker and diltiazem.   2. Chronic diastolic CHF: Continue IV diuresis, endorses orthopnea today. Would continue IV diuresis today.   3. History of interstitial lung disease: Followed by Pulmonology, Dr. Melvyn Novas has seen her recently, she is stable.   4. HTN: BP stable.   5. Anxiety: Suspect that her anxiety is playing a role here, she is worried this morning that her medicines are causing facial swelling. I do not see any swelling on exam. She was also up the entire night. Has pressured speech this am. Will give a dose of Xanax this am. Consider adding  it hs for sleep.      Signed, Arbutus Leas, NP  05/18/2016, 7:36 AM  Patient seen and examined and history reviewed. Agree with above findings and plan. Patient very talkative and in no distress. Complains of dyspnea and sensation of choking. States she is no better than she was one month ago. Thinks her medication is part of the problem so I think hasn't been all that compliant. Recommend continued diltiazem and metoprolol per EP recommendations. Continue IV lasix.   Adream Parzych [place], [place] 05/18/2016 9:46 AM

## 2016-05-19 LAB — BASIC METABOLIC PANEL
ANION GAP: 9 (ref 5–15)
BUN: 13 mg/dL (ref 6–20)
CHLORIDE: 106 mmol/L (ref 101–111)
CO2: 27 mmol/L (ref 22–32)
Calcium: 9 mg/dL (ref 8.9–10.3)
Creatinine, Ser: 0.73 mg/dL (ref 0.44–1.00)
Glucose, Bld: 93 mg/dL (ref 65–99)
POTASSIUM: 3 mmol/L — AB (ref 3.5–5.1)
SODIUM: 142 mmol/L (ref 135–145)

## 2016-05-19 MED ORDER — POTASSIUM CHLORIDE CRYS ER 20 MEQ PO TBCR
40.0000 meq | EXTENDED_RELEASE_TABLET | Freq: Every day | ORAL | Status: DC
  Administered 2016-05-19 – 2016-05-21 (×3): 40 meq via ORAL
  Filled 2016-05-19 (×3): qty 2

## 2016-05-19 MED ORDER — FUROSEMIDE 20 MG PO TABS: 40.0000 mg | ORAL_TABLET | Freq: Every day | ORAL | 0 refills | Status: DC

## 2016-05-19 NOTE — Discharge Instructions (Signed)
Follow with VAN Wendie Chess, MD in 1-2 weeks  Please get a complete blood count and chemistry panel checked by your Primary MD at your next visit, and again as instructed by your Primary MD. Please get your medications reviewed and adjusted by your Primary MD.  Please request your Primary MD to go over all Hospital Tests and Procedure/Radiological results at the follow up, please get all Hospital records sent to your Prim MD by signing hospital release before you go home.  If you had Pneumonia of Lung problems at the Hospital: Please get a 2 view Chest X ray done in 6-8 weeks after hospital discharge or sooner if instructed by your Primary MD.  If you have Congestive Heart Failure: Please call your Cardiologist or Primary MD anytime you have any of the following symptoms:  1) 3 pound weight gain in 24 hours or 5 pounds in 1 week  2) shortness of breath, with or without a dry hacking cough  3) swelling in the hands, feet or stomach  4) if you have to sleep on extra pillows at night in order to breathe  Follow cardiac low salt diet and 1.5 lit/day fluid restriction.  If you have diabetes Accuchecks 4 times/day, Once in AM empty stomach and then before each meal. Log in all results and show them to your primary doctor at your next visit. If any glucose reading is under 80 or above 300 call your primary MD immediately.  If you have Seizure/Convulsions/Epilepsy: Please do not drive, operate heavy machinery, participate in activities at heights or participate in high speed sports until you have seen by Primary MD or a Neurologist and advised to do so again.  If you had Gastrointestinal Bleeding: Please ask your Primary MD to check a complete blood count within one week of discharge or at your next visit. Your endoscopic/colonoscopic biopsies that are pending at the time of discharge, will also need to followed by your Primary MD.  Get Medicines reviewed and adjusted. Please take all your  medications with you for your next visit with your Primary MD  Please request your Primary MD to go over all hospital tests and procedure/radiological results at the follow up, please ask your Primary MD to get all Hospital records sent to his/her office.  If you experience worsening of your admission symptoms, develop shortness of breath, life threatening emergency, suicidal or homicidal thoughts you must seek medical attention immediately by calling 911 or calling your MD immediately  if symptoms less severe.  You must read complete instructions/literature along with all the possible adverse reactions/side effects for all the Medicines you take and that have been prescribed to you. Take any new Medicines after you have completely understood and accpet all the possible adverse reactions/side effects.   Do not drive or operate heavy machinery when taking Pain medications.   Do not take more than prescribed Pain, Sleep and Anxiety Medications  Special Instructions: If you have smoked or chewed Tobacco  in the last 2 yrs please stop smoking, stop any regular Alcohol  and or any Recreational drug use.  Wear Seat belts while driving.  Please note You were cared for by a hospitalist during your hospital stay. If you have any questions about your discharge medications or the care you received while you were in the hospital after you are discharged, you can call the unit and asked to speak with the hospitalist on call if the hospitalist that took care of you is not available.  Once you are discharged, your primary care physician will handle any further medical issues. Please note that NO REFILLS for any discharge medications will be authorized once you are discharged, as it is imperative that you return to your primary care physician (or establish a relationship with a primary care physician if you do not have one) for your aftercare needs so that they can reassess your need for medications and monitor your  lab values.  You can reach the hospitalist office at phone [phone] or fax [phone]   If you do not have a primary care physician, you can call [phone] for a physician referral.  Activity: As tolerated with Full fall precautions use walker/cane & assistance as needed  Diet: heart healthy, low salt  Disposition Home

## 2016-05-19 NOTE — Progress Notes (Signed)
PROGRESS NOTE  Sara [name] [id number] DOB: Oct 25, 1925 DOA: 05/16/2016 PCP: Townsend Roger, MD   LOS: 2 days   Brief Narrative: 82 y.o. female with medical history significant of A.Fib, diastolic CHF with normal EF as of October, GERD, hiatal hernia, very mild amiodarone pulmonary toxicity.  Patient presents to the ED with c/o several week history of worsening peripheral edema.  She has mild DOE as well.  Symptoms onset several weeks ago, started lasix 20mg  PO BID x3 weeks ago and despite this edema has progressed.  Assessment & Plan: Principal Problem:   Acute on chronic diastolic CHF (congestive heart failure) (HCC) Active Problems:   Essential hypertension   Paroxysmal atrial fibrillation (HCC)   CHF (congestive heart failure) (HCC)   A fib with RVR - patient complains of poorly controlled rates at home, she is status post TEE with cardioversion about 1-1/2 months ago, and noted to be back in A fib in outpatient follow up - continue diltiazem and Metoprolol, diuresis with IV Lasix, per cardiology to remain inpatient for an additional day today  - cardiology consulted, appreciate input - continue Eliquis  Acute on chronic diastolic CHF - Lasix 40 IV BID, net negative -2.5L, weight 152 >> 145  Amiodarone induced pulmonary toxicity  - Patient was initially on amiodarone for her atrial fibrillation, however she was admitted from the hospital recently with BOOP, and her amiodarone was discontinued in February 2017 - stable, seeing pulmonology as an outpatient  HTN - continue home medications, BP controlled   Hypokalemia - Needs ongoing repletion   DVT prophylaxis: on Eliquis Code Status: Full Family Communication: no family bedside Disposition Plan: home when ready, likely 1 day  Consultants:   Cardiology   Procedures:   None   Antimicrobials:  None    Subjective: - feels no improvement in her "heart", appreciates her LE swelling is  better  Objective: Vitals:   05/18/16 1800 05/18/16 1900 05/18/16 2126 05/19/16 0500  BP:   120/78 120/68  Pulse: (!) 140 68 (!) 101 92  Resp: 17 16 (!) 21 18  Temp:   98.6 F (37 C) 98.7 F (37.1 C)  TempSrc:   Oral Oral  SpO2: 99% 96% 92% 92%  Weight:    66 kg (145 lb 6.4 oz)  Height:        Intake/Output Summary (Last 24 hours) at 05/19/16 1422 Last data filed at 05/19/16 0900  Gross per 24 hour  Intake              480 ml  Output             1751 ml  Net            -1271 ml   Filed Weights   05/17/16 1307 05/18/16 0550 05/19/16 0500  Weight: 67.9 kg (149 lb 11.2 oz) 68.1 kg (150 lb 1.6 oz) 66 kg (145 lb 6.4 oz)    Examination: Constitutional: NAD, appears tachypneic Vitals:   05/18/16 1800 05/18/16 1900 05/18/16 2126 05/19/16 0500  BP:   120/78 120/68  Pulse: (!) 140 68 (!) 101 92  Resp: 17 16 (!) 21 18  Temp:   98.6 F (37 C) 98.7 F (37.1 C)  TempSrc:   Oral Oral  SpO2: 99% 96% 92% 92%  Weight:    66 kg (145 lb 6.4 oz)  Height:       Eyes: PERRL ENMT: Mucous membranes are moist.  Respiratory: mild bibasilar crackles Cardiovascular: irregular, no NRG.  1-2+ LE edema. 2+ pedal pulses. Abdomen: no tenderness. Bowel sounds positive.  Musculoskeletal: no clubbing / cyanosis.  Skin: no rashes, lesions, ulcers. No induration Neurologic: non focal   Psychiatric: Normal judgment and insight. Alert and oriented x 3. Normal mood.    Data Reviewed: I have personally reviewed following labs and imaging studies  CBC:  Recent Labs Lab 05/16/16 1834  WBC 5.7  HGB 10.9*  HCT 34.3*  MCV 78.0  PLT [id number]   Basic Metabolic Panel:  Recent Labs Lab 05/16/16 1834 05/17/16 0332 05/18/16 0256 05/19/16 0306  NA 145 145 142 142  K 2.7* 3.1* 3.5 3.0*  CL 111 112* 109 106  CO2 26 24 25 27   GLUCOSE 133* 96 100* 93  BUN 16 15 14 13   CREATININE 0.89 0.75 0.75 0.73  CALCIUM 9.0 9.1 9.1 9.0   Urine analysis:    Component Value Date/Time   COLORURINE YELLOW  08/23/2010 1257   APPEARANCEUR CLEAR 08/23/2010 1257   LABSPEC 1.018 08/23/2010 1257   PHURINE 5.5 08/23/2010 [place] 08/23/2010 1257   HGBUR NEGATIVE 08/23/2010 1257   [place] 08/23/2010 1257   KETONESUR NEGATIVE 08/23/2010 1257   PROTEINUR NEGATIVE 08/23/2010 1257   UROBILINOGEN 0.2 08/23/2010 1257   NITRITE NEGATIVE 08/23/2010 1257   LEUKOCYTESUR  08/23/2010 1257    NEGATIVE MICROSCOPIC NOT DONE ON URINES WITH NEGATIVE PROTEIN, BLOOD, LEUKOCYTES, NITRITE, OR GLUCOSE <1000 mg/dL.   Sepsis Labs: Invalid input(s): PROCALCITONIN, LACTICIDVEN  No results found for this or any previous visit (from the past 240 hour(s)).    Radiology Studies: No results found.   Scheduled Meds: . anastrozole  1 mg Oral BH-q7a  . apixaban  2.5 mg Oral BID  . diltiazem  120 mg Oral Daily  . furosemide  40 mg Intravenous BID  . metoprolol  50 mg Oral BID WC  . potassium chloride  40 mEq Oral Daily  . rOPINIRole  1 mg Oral QHS  . sodium chloride flush  3 mL Intravenous Q12H   Continuous Infusions:  Marzetta Board, MD, PhD [facility] Pager [phone]  If 7PM-7AM, please contact night-coverage [email or web address] Password [email or web address] 05/19/2016, 2:22 PM

## 2016-05-19 NOTE — Progress Notes (Signed)
Physical Therapy Treatment Patient Details Name: Sara [name] MRN: [id number] DOB: 06-18-1926 Today's Date: 05/19/2016    History of Present Illness Patient is a 82 y/o female with hx of HTN, HLD, CHF, paroxysmal A-fib recently admitted from 03/29/16-04/01/16 for rapid Afib, had successful TEE/DCCV presents with dyspnea, BLE edema and atrial fib.     PT Comments    Patient progressing well towards PT goals. Reports feeling weaker today due to not having walked yesterday. Improved ambulation distance today but demonstrated decreased muscular endurance towards end of bout. Eager to get home. Reports breathing has improved. HR more stable today but continues to be in A-fib. Will continue to follow.   Follow Up Recommendations  Home health PT;Supervision - Intermittent     Equipment Recommendations  None recommended by PT    Recommendations for Other Services       Precautions / Restrictions Precautions Precautions: Fall Precaution Comments: watch HR Restrictions Weight Bearing Restrictions: No    Mobility  Bed Mobility               General bed mobility comments: Up in chair upon PT arrival.   Transfers Overall transfer level: Needs assistance Equipment used: Rolling walker (2 wheeled) Transfers: Stand Pivot Transfers;Sit to/from Stand Sit to Stand: Min guard Stand pivot transfers: Min assist       General transfer comment: Min guard for safety. Stood from chair x1, from [facility] x1.  Ambulation/Gait Ambulation/Gait assistance: Min guard Ambulation Distance (Feet): 120 Feet Assistive device: Rolling walker (2 wheeled) Gait Pattern/deviations: Step-through pattern;Decreased stride length;Trunk flexed Gait velocity: decreased   General Gait Details:  very slow, mostly steady gait with weakness in BLEs. HR ranged from 80s-128 bpm. Reports weakness towards end of bout.   Stairs            Wheelchair Mobility    Modified Rankin (Stroke Patients Only)        Balance Overall balance assessment: Needs assistance Sitting-balance support: Feet supported;No upper extremity supported Sitting balance-Leahy Scale: Good Sitting balance - Comments: Able to perform pericare without difficulty.   Standing balance support: During functional activity Standing balance-Leahy Scale: Fair Standing balance comment: Needs at least 1 UE support for transfers but BUE support during gait training.                    Cognition Arousal/Alertness: Awake/alert Behavior During Therapy: WFL for tasks assessed/performed Overall Cognitive Status: Within Functional Limits for tasks assessed                      Exercises      General Comments        Pertinent Vitals/Pain Pain Assessment: No/denies pain    Home Living                      Prior Function            PT Goals (current goals can now be found in the care plan section) Progress towards PT goals: Progressing toward goals    Frequency    Min 3X/week      PT Plan Current plan remains appropriate    Co-evaluation             End of Session Equipment Utilized During Treatment: Gait belt Activity Tolerance: Patient tolerated treatment well Patient left: in chair;with call bell/phone within reach     Time: 1102-1124 PT Time Calculation (min) (ACUTE ONLY): 22 min  Charges:  $  Gait Training: 8-22 mins                    G Codes:      Ilyssa Grennan A Sheyanne Munley 05/19/2016, 12:23 PM  Wray Kearns, [place], DPT [phone]

## 2016-05-19 NOTE — Progress Notes (Signed)
Report called to RN on 3E24.  Pt given explanation for transfer to a different unit and is agreeable. All belongings transported with pt. Pt transported via wc on tele to 3e24.

## 2016-05-19 NOTE — Care Management (Signed)
1203 05-19-16 Jacqlyn Krauss, RN, BSN [phone] CM did fax orders to [facility] for [facility]. SOC to begin within 24-48 hours post d/c. No further needs from CM at this time.

## 2016-05-19 NOTE — Progress Notes (Signed)
Patient Name: Sara [name] Date of Encounter: 05/19/2016  Primary Cardiologist: Dr. Beckie Salts Problem List     Principal Problem:   Acute on chronic diastolic CHF (congestive heart failure) [facility]) Active Problems:   Essential hypertension   Paroxysmal atrial fibrillation (HCC)   CHF (congestive heart failure) ([place])     Subjective   Says she doesn't feel well, still with palpitations. Did sleep much better last night.   Inpatient Medications    Scheduled Meds: . anastrozole  1 mg Oral BH-q7a  . apixaban  2.5 mg Oral BID  . diltiazem  120 mg Oral Daily  . furosemide  40 mg Intravenous BID  . metoprolol  50 mg Oral BID WC  . rOPINIRole  1 mg Oral QHS  . sodium chloride flush  3 mL Intravenous Q12H   Continuous Infusions:  PRN Meds: sodium chloride, acetaminophen, ALPRAZolam, ondansetron (ZOFRAN) IV, sodium chloride flush   Vital Signs    Vitals:   05/18/16 1800 05/18/16 1900 05/18/16 2126 05/19/16 0500  BP:   120/78 120/68  Pulse: (!) 140 68 (!) 101 92  Resp: 17 16 (!) 21 18  Temp:   98.6 F (37 C) 98.7 F (37.1 C)  TempSrc:   Oral Oral  SpO2: 99% 96% 92% 92%  Weight:    145 lb 6.4 oz (66 kg)  Height:        Intake/Output Summary (Last 24 hours) at 05/19/16 1008 Last data filed at 05/19/16 0500  Gross per 24 hour  Intake              480 ml  Output             2525 ml  Net            -2045 ml   Filed Weights   05/17/16 1307 05/18/16 0550 05/19/16 0500  Weight: 149 lb 11.2 oz (67.9 kg) 150 lb 1.6 oz (68.1 kg) 145 lb 6.4 oz (66 kg)    Physical Exam    GEN: Well nourished, well developed, elderly female in no acute distress.  HEENT: Grossly normal.  Neck: Supple, no JVD, carotid bruits, or masses. Cardiac: iRRR, no murmurs, rubs, or gallops. No clubbing, cyanosis, edema.  Radials/DP/PT 2+ and equal bilaterally.  Respiratory:  Respirations regular and unlabored, clear to auscultation bilaterally. GI: Soft, nontender, nondistended, BS + x  4. MS: no deformity or atrophy. Skin: warm and dry, no rash. Neuro:  Strength and sensation are intact. Psych: AAOx3.  Normal affect.  Labs    CBC  Recent Labs  05/16/16 1834  WBC 5.7  HGB 10.9*  HCT 34.3*  MCV 78.0  PLT [id number]   Basic Metabolic Panel  Recent Labs  05/18/16 0256 05/19/16 0306  NA 142 142  K 3.5 3.0*  CL 109 106  CO2 25 27  GLUCOSE 100* 93  BUN 14 13  CREATININE 0.75 0.73  CALCIUM 9.1 9.0     Telemetry    Afib - Personally Reviewed   Radiology    No results found.  Cardiac Studies   Transthoracic Echocardiography  Study Conclusions  - Left ventricle: The cavity size was normal. Wall thickness was   increased in a pattern of mild LVH. Systolic function was   vigorous. The estimated ejection fraction was in the range of 65%   to 70%. Wall motion was normal; there were no regional wall   motion abnormalities. Doppler parameters are consistent with high  ventricular filling pressure. - Aortic valve: There was trivial regurgitation. - Mitral valve: Calcified annulus. Mildly thickened leaflets . - Left atrium: The atrium was severely dilated. - Right atrium: The atrium was mildly dilated. - Pulmonary arteries: Systolic pressure was mildly increased. PA   peak pressure: 37 mm Hg (S).  Impressions:  - Vigorous LV systolic function with intracavitary gradient of 1.7   m/s; mild LVH; elevated LV filling pressure; biatrial   enlargement; trace AI; mild TR with mildly elevated pulmonary   pressure.   Patient Profile     Ms. 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 Recently admitted from 03/29/16-04/01/16 for rapid Afib, had successful TEE/DCCV. Admitted on 05/16/16 with dyspnea and BLE edema, in atrial fib.      Assessment & Plan  1. Paroxsymal atrial fibrillation: patient presents with palpitations, SOB and  bilateral lower extremity edema. 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 She has previously been intolerant to rythmol and Multaq. Also, noted to have prolonged QTc at .531, but has a RBBBso likely that her QTc is falsely prolonged but stillnot a candidate for Tikosyn. Left atrium severely dilated by Echo last month.   Also, refused LINQ last admission. Currently on po diltiazem 120mg  and Eliquis 2.5mg . She was on 25mg  metoprolol when she was seen in the Afib clinic however, this was reduced per patient request. Now on 50mg  metoprolol BID while admitted and has rates in the 90's - 100's with very short runs of rapid Afib in the 120 bpm range.   She is likely asymptomatic with the proper amount of rate control, but the challenge is her compliance to medications as I suspect there is an aspect of dementia here.   EP has seen and recommends to continue her beta blocker and diltiazem. She wants to go home today, I think she is fine to go on 50mg  Metoprolol and 120mg  diltiazem.   2. Chronic diastolic CHF: Looking more stable from a volume standpoint.Would send home on 40mg  Lasix daily.   3. History of interstitial lung disease: Followed by Pulmonology, Dr. Melvyn Novas has seen her recently, she is stable.   4. HTN: BP stable.   5. Anxiety: Suspect that her anxiety is playing a role here, she is worried this morning that her medicines are causing facial swelling. I do not see any swelling on exam. She was also up the entire night. Has pressured speech this am. Will give a dose of Xanax this am. Consider adding it hs for sleep.      Signed, Arbutus Leas, NP  05/19/2016, 10:08 AM  Patient seen and examined and history reviewed. Agree with above findings and plan. She is responding well to diuretics. I/O negative 1800 cc. Weight down 5-7 lbs. Afib rate well controlled. Still complains of SOB. Still has some edema. Plan to continue IV lasix. Anticipate DC this  weekend. Replete potassium  Peter Martinique, [place] 05/19/2016 1:25 PM

## 2016-05-20 DIAGNOSIS — E876 Hypokalemia: Secondary | ICD-10-CM

## 2016-05-20 LAB — BASIC METABOLIC PANEL
Anion gap: 10 (ref 5–15)
BUN: 17 mg/dL (ref 6–20)
CO2: 26 mmol/L (ref 22–32)
Calcium: 9 mg/dL (ref 8.9–10.3)
Chloride: 105 mmol/L (ref 101–111)
Creatinine, Ser: 0.83 mg/dL (ref 0.44–1.00)
Glucose, Bld: 90 mg/dL (ref 65–99)
POTASSIUM: 3.6 mmol/L (ref 3.5–5.1)
SODIUM: 141 mmol/L (ref 135–145)

## 2016-05-20 MED ORDER — FUROSEMIDE 40 MG PO TABS
40.0000 mg | ORAL_TABLET | Freq: Every day | ORAL | Status: DC
  Administered 2016-05-21: 40 mg via ORAL
  Filled 2016-05-20: qty 1

## 2016-05-20 NOTE — Progress Notes (Signed)
PROGRESS NOTE    Sara [name]  [id number] DOB: 1925-09-12 DOA: 05/16/2016 PCP: Townsend Roger, MD   Brief Narrative: 82 y.o.femalewith medical history significant of A.Fib, diastolic CHF with normal EF as of October, GERD, hiatal hernia, very mild amiodarone pulmonary toxicity. Patient presents to the ED with c/o several week history of worsening peripheral edema. She has mild DOE as well. Symptoms onset several weeks ago, started lasix 20mg  PO BID x3 weeks ago and despite this edema has progressed.  Assessment & Plan:   # A fib with RVR - pt had TEE with cardioversion about 1-1/2 months ago, and noted to be back in A fib in outpatient follow up. - Rate control with diltiazem and metoprolol. Cardiology recommended 1 more day of IV Lasix and switch to oral Lasix tomorrow. Cardiology consult appreciated. Continue telemetry monitoring. - continue Eliquis  #Acute on chronic diastolic CHF -Diuresing well with IV Lasix. Continue IV Lasix tonight and switched to oral Lasix tomorrow. Patient was educated on low salt diet and weight monitoring at home.  #Amiodarone induced pulmonary toxicity  - Patient was initially on amiodarone for her atrial fibrillation, however she was admitted from the hospital recently with BOOP, and her amiodarone was discontinued in February 2017 - Stable now, advised outpatient follow-up with pulmonologist and cardiologist.  #HTN - Blood pressure acceptable. Continue current cardiac medication and diuretics. Monitor blood pressure  #Hypokalemia:  Potassium level acceptable. Continue to replete potassium chloride.   DVT prophylaxis: Systemic anticoagulation Code Status: Full code Family Communication: No family present at bedside Disposition Plan: Likely discharge home tomorrow.    Consultants:   Cardiologist  Procedures: None Antimicrobials: None  Subjective: Patient was seen and examined at bedside. Patient reported feeling good.  Denied shortness of breath, chest pain, nausea, vomiting, headache or dizziness.   Objective: Vitals:   05/19/16 1826 05/19/16 2000 05/20/16 0511 05/20/16 0909  BP: 118/69 119/69 104/85 137/78  Pulse: 100 (!) 58 60 77  Resp: 18 18 18    Temp: 98 F (36.7 C) 97.5 F (36.4 C) 97.8 F (36.6 C) 97.7 F (36.5 C)  TempSrc: Oral Oral Oral Oral  SpO2: 93% 94% 96% 97%  Weight: 63.3 kg (139 lb 9.6 oz)  62.8 kg (138 lb 8 oz)   Height: 5' (1.524 m)       Intake/Output Summary (Last 24 hours) at 05/20/16 1209 Last data filed at 05/20/16 1101  Gross per 24 hour  Intake              843 ml  Output             1550 ml  Net             -707 ml   Filed Weights   05/19/16 0500 05/19/16 1826 05/20/16 0511  Weight: 66 kg (145 lb 6.4 oz) 63.3 kg (139 lb 9.6 oz) 62.8 kg (138 lb 8 oz)    Examination:  General exam: Appears calm and comfortable  Respiratory system: Clear to auscultation. Respiratory effort normal. No wheezing or crackle Cardiovascular system: S1 & S2 heard, RRR.  No pedal edema. Gastrointestinal system: Abdomen is nondistended, soft and nontender. Normal bowel sounds heard. Central nervous system: Alert and oriented. No focal neurological deficits. Extremities: Symmetric 5 x 5 power. Skin: No rashes, lesions or ulcers Psychiatry: Judgement and insight appear normal. Mood & affect appropriate.     Data Reviewed: I have personally reviewed following labs and imaging studies  CBC:  Recent Labs  Lab 05/16/16 1834  WBC 5.7  HGB 10.9*  HCT 34.3*  MCV 78.0  PLT [id number]   Basic Metabolic Panel:  Recent Labs Lab 05/16/16 1834 05/17/16 0332 05/18/16 0256 05/19/16 0306 05/20/16 0338  NA 145 145 142 142 141  K 2.7* 3.1* 3.5 3.0* 3.6  CL 111 112* 109 106 105  CO2 26 24 25 27 26   GLUCOSE 133* 96 100* 93 90  BUN 16 15 14 13 17   CREATININE 0.89 0.75 0.75 0.73 0.83  CALCIUM 9.0 9.1 9.1 9.0 9.0   GFR: Estimated Creatinine Clearance: 38.8 mL/min (by C-G formula based on  SCr of 0.83 mg/dL). Liver Function Tests: No results for input(s): AST, ALT, ALKPHOS, BILITOT, PROT, ALBUMIN in the last 168 hours. No results for input(s): LIPASE, AMYLASE in the last 168 hours. No results for input(s): AMMONIA in the last 168 hours. Coagulation Profile: No results for input(s): INR, PROTIME in the last 168 hours. Cardiac Enzymes: No results for input(s): CKTOTAL, CKMB, CKMBINDEX, TROPONINI in the last 168 hours. BNP (last 3 results) No results for input(s): PROBNP in the last 8760 hours. HbA1C: No results for input(s): HGBA1C in the last 72 hours. CBG: No results for input(s): GLUCAP in the last 168 hours. Lipid Profile: No results for input(s): CHOL, HDL, LDLCALC, TRIG, CHOLHDL, LDLDIRECT in the last 72 hours. Thyroid Function Tests: No results for input(s): TSH, T4TOTAL, FREET4, T3FREE, THYROIDAB in the last 72 hours. Anemia Panel: No results for input(s): VITAMINB12, FOLATE, FERRITIN, TIBC, IRON, RETICCTPCT in the last 72 hours. Sepsis Labs: No results for input(s): PROCALCITON, LATICACIDVEN in the last 168 hours.  No results found for this or any previous visit (from the past 240 hour(s)).       Radiology Studies: No results found.      Scheduled Meds: . anastrozole  1 mg Oral BH-q7a  . apixaban  2.5 mg Oral BID  . diltiazem  120 mg Oral Daily  . furosemide  40 mg Intravenous BID  . [START ON 05/21/2016] furosemide  40 mg Oral Daily  . metoprolol  50 mg Oral BID WC  . potassium chloride  40 mEq Oral Daily  . rOPINIRole  1 mg Oral QHS  . sodium chloride flush  3 mL Intravenous Q12H   Continuous Infusions:   LOS: 3 days    Elye Harmsen Tanna Furry, MD [facility] Pager [phone]  If 7PM-7AM, please contact night-coverage [email or web address] Password [facility] 05/20/2016, 12:09 PM

## 2016-05-20 NOTE — Progress Notes (Signed)
Patient Name: Markya Rossen Date of Encounter: 05/20/2016  Primary Cardiologist: Dr. Beckie Salts Problem List     Principal Problem:   Acute on chronic diastolic CHF (congestive heart failure) ([place]) Active Problems:   Essential hypertension   Paroxysmal atrial fibrillation (HCC)   CHF (congestive heart failure) ([place])     Subjective   Continues to diurese. Weight prior to admission 146, now 138 lbs.  Now 3.2L negative. Creatinine appears stable. BNP was only mildly elevated at 362.  Inpatient Medications    Scheduled Meds: . anastrozole  1 mg Oral BH-q7a  . apixaban  2.5 mg Oral BID  . diltiazem  120 mg Oral Daily  . furosemide  40 mg Intravenous BID  . metoprolol  50 mg Oral BID WC  . potassium chloride  40 mEq Oral Daily  . rOPINIRole  1 mg Oral QHS  . sodium chloride flush  3 mL Intravenous Q12H   Continuous Infusions:  PRN Meds: sodium chloride, acetaminophen, ALPRAZolam, ondansetron (ZOFRAN) IV, sodium chloride flush   Vital Signs    Vitals:   05/19/16 1826 05/19/16 2000 05/20/16 0511 05/20/16 0909  BP: 118/69 119/69 104/85 137/78  Pulse: 100 (!) 58 60 77  Resp: 18 18 18    Temp: 98 F (36.7 C) 97.5 F (36.4 C) 97.8 F (36.6 C) 97.7 F (36.5 C)  TempSrc: Oral Oral Oral Oral  SpO2: 93% 94% 96% 97%  Weight: 139 lb 9.6 oz (63.3 kg)  138 lb 8 oz (62.8 kg)   Height: 5' (1.524 m)       Intake/Output Summary (Last 24 hours) at 05/20/16 1126 Last data filed at 05/20/16 1101  Gross per 24 hour  Intake              843 ml  Output             1550 ml  Net             -707 ml   Filed Weights   05/19/16 0500 05/19/16 1826 05/20/16 0511  Weight: 145 lb 6.4 oz (66 kg) 139 lb 9.6 oz (63.3 kg) 138 lb 8 oz (62.8 kg)    Physical Exam    GEN: Well nourished, well developed, elderly female in no acute distress.  HEENT: Grossly normal.  Neck: Supple, no JVD, carotid bruits, or masses. Cardiac: iRRR, no murmurs, rubs, or gallops. No clubbing, cyanosis,  edema.  Radials/DP/PT 2+ and equal bilaterally.  Respiratory:  Respirations regular and unlabored, clear to auscultation bilaterally. GI: Soft, nontender, nondistended, BS + x 4. MS: no deformity or atrophy. Skin: warm and dry, no rash. Neuro:  Strength and sensation are intact. Psych: AAOx3.  Normal affect.  Labs    CBC No results for input(s): WBC, NEUTROABS, HGB, HCT, MCV, PLT in the last 72 hours. Basic Metabolic Panel  Recent Labs  05/19/16 0306 05/20/16 0338  NA 142 141  K 3.0* 3.6  CL 106 105  CO2 27 26  GLUCOSE 93 90  BUN 13 17  CREATININE 0.73 0.83  CALCIUM 9.0 9.0     Telemetry    Afib - Personally Reviewed   Radiology    No results found.  Cardiac Studies   Transthoracic Echocardiography  Study Conclusions  - Left ventricle: The cavity size was normal. Wall thickness was   increased in a pattern of mild LVH. Systolic function was   vigorous. The estimated ejection fraction was in the range of 65%  to 70%. Wall motion was normal; there were no regional wall   motion abnormalities. Doppler parameters are consistent with high   ventricular filling pressure. - Aortic valve: There was trivial regurgitation. - Mitral valve: Calcified annulus. Mildly thickened leaflets . - Left atrium: The atrium was severely dilated. - Right atrium: The atrium was mildly dilated. - Pulmonary arteries: Systolic pressure was mildly increased. PA   peak pressure: 37 mm Hg (S).  Impressions:  - Vigorous LV systolic function with intracavitary gradient of 1.7   m/s; mild LVH; elevated LV filling pressure; biatrial   enlargement; trace AI; mild TR with mildly elevated pulmonary   pressure.   Patient Profile     Ms. 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 Recently admitted from 03/29/16-04/01/16 for rapid Afib, had successful TEE/DCCV. Admitted on  05/16/16 with dyspnea and BLE edema, in atrial fib.      Assessment & Plan  1. Paroxsymal atrial fibrillation: patient presents with palpitations, SOB and bilateral lower extremity edema. 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 She has previously been intolerant to rythmol and Multaq. Also, noted to have prolonged QTc at .531, but has a RBBBso likely that her QTc is falsely prolonged but stillnot a candidate for Tikosyn. Left atrium severely dilated by Echo last month.   Also, refused LINQ last admission. Currently on po diltiazem 120mg  and Eliquis 2.5mg . She was on 25mg  metoprolol when she was seen in the Afib clinic however, this was reduced per patient request. Now on 50mg  metoprolol BID while admitted and has rates in the 90's - 100's with very short runs of rapid Afib in the 120 bpm range.   She is likely asymptomatic with the proper amount of rate control, but the challenge is her compliance to medications as I suspect there is an aspect of dementia here.   EP has seen and recommends to continue her beta blocker and diltiazem. She wants to go home today, I think she is fine to go on 50mg  Metoprolol and 120mg  diltiazem.   2. Chronic diastolic CHF: Looking more stable from a volume standpoint. On IV lasix 40 mg BID. Continue today and switch to po lasix 40mg  daily tomorrow.   3. History of interstitial lung disease: Followed by Pulmonology, Dr. Melvyn Novas has seen her recently, she is stable.   4. HTN: BP stable.   5. Anxiety: Suspect that her anxiety is playing a role here, she is worried this morning that her medicines are causing facial swelling. I do not see any swelling on exam. She was also up the entire night. Has pressured speech this am. Will give a dose of Xanax this am. Consider adding it hs for sleep.    Pixie Casino, MD, [facility] Attending Cardiologist [place], MD  05/20/2016, 11:26 AM

## 2016-05-21 DIAGNOSIS — Z23 Encounter for immunization: Secondary | ICD-10-CM | POA: Diagnosis not present

## 2016-05-21 LAB — BASIC METABOLIC PANEL
ANION GAP: 9 (ref 5–15)
BUN: 15 mg/dL (ref 6–20)
CALCIUM: 9.2 mg/dL (ref 8.9–10.3)
CO2: 26 mmol/L (ref 22–32)
CREATININE: 0.79 mg/dL (ref 0.44–1.00)
Chloride: 106 mmol/L (ref 101–111)
Glucose, Bld: 91 mg/dL (ref 65–99)
Potassium: 4 mmol/L (ref 3.5–5.1)
SODIUM: 141 mmol/L (ref 135–145)

## 2016-05-21 MED ORDER — FUROSEMIDE 40 MG PO TABS: 40.0000 mg | ORAL_TABLET | Freq: Every day | ORAL | 1 refills | Status: DC

## 2016-05-21 MED ORDER — POTASSIUM CHLORIDE CRYS ER 20 MEQ PO TBCR: 20.0000 meq | EXTENDED_RELEASE_TABLET | Freq: Every day | ORAL | 0 refills | Status: DC

## 2016-05-21 NOTE — Discharge Summary (Signed)
Physician Discharge Summary  Gearline Hitchman [id number] DOB: 08/06/1925 DOA: 05/16/2016  PCP: Townsend Roger, MD  Admit date: 05/16/2016 Discharge date: 05/21/2016  Admitted From: home Disposition:  home  Recommendations for Outpatient Follow-up:  1. Follow up with PCP in 1-2 weeks  Home Health: PT Equipment/Devices: none  Discharge Condition: stable CODE STATUS: Full Diet recommendation: Heart healthy, low salt  HPI: per Dr. Alcario Drought, Sara [name] is a 82 y.o. female with medical history significant of A.Fib, diastolic CHF with normal EF as of October, GERD, hiatal hernia, very mild amiodarone pulmonary toxicity.  Patient presents to the ED with c/o several week history of worsening peripheral edema.  She has mild DOE as well.  Symptoms onset several weeks ago, started lasix 20mg  PO BID x3 weeks ago and despite this edema has progressed.  Hospital Course: Discharge Diagnoses:  Principal Problem:   Acute on chronic diastolic CHF (congestive heart failure) (HCC) Active Problems:   Essential hypertension   Paroxysmal atrial fibrillation (HCC)   CHF (congestive heart failure) (HCC)   Hypokalemia  A fib with RVR - patient complains of poorly controlled rates at home, she is status post TEE with cardioversion about 1-1/2 months ago, and noted to be back in A fib in outpatient follow up, continue diltiazem and Metoprolol, diuresis with IV Lasix, suspect poor rate control due to fluid overload, rates improved when she became euvolemic as below. Continue Eliquis Acute on chronic diastolic CHF - Lasix 40 IV BID while hospitalized, her swelling significantly improved, her weight on admission was 152 pounds, weight on discharge 135. She is to resume home Lasix, was also educated regarding low salt diet Amiodarone induced pulmonary toxicity - Patient was initially on amiodarone for her atrial fibrillation, however she was admitted from the hospital recently with BOOP, and her  amiodarone was discontinued in February 2017. Stable, seeing pulmonology as an outpatient.Patient reports that she stops breathing when she tries to fall asleep, recommend sleep study as an outpatient HTN - continue home medications, BP controlled  Hypokalemia - Needs ongoing repletion, on K at home   Discharge Instructions     Medication List    TAKE these medications   anastrozole 1 MG tablet Commonly known as:  ARIMIDEX Take 1 mg by mouth every morning.   apixaban 2.5 MG Tabs tablet Commonly known as:  ELIQUIS Take 1 tablet (2.5 mg total) by mouth 2 (two) times daily.   diltiazem 120 MG 24 hr capsule Commonly known as:  CARDIZEM CD Take 1 capsule (120 mg total) by mouth daily.   furosemide 40 MG tablet Commonly known as:  LASIX Take 1 tablet (40 mg total) by mouth daily. Start taking on:  05/22/2016 What changed:  medication strength  how much to take  when to take this   metoprolol 50 MG tablet Commonly known as:  LOPRESSOR Take 50 mg by mouth 2 (two) times daily with a meal.   potassium chloride SA 20 MEQ tablet Commonly known as:  K-DUR,KLOR-CON Take 1 tablet (20 mEq total) by mouth daily. Start taking on:  05/22/2016   rOPINIRole 1 MG tablet Commonly known as:  REQUIP Take 1 tablet by mouth at bedtime.   Vitamin D3 1000 units Caps Take 1 tablet by mouth daily.      Follow-up Information    Townsend Roger, MD. Schedule an appointment as soon as possible for a visit in 2 week(s).   Specialty:  Internal Medicine Contact information: [place]  [address] [place] [address] [facility] Follow up.   Specialty:  Home Health Services Why:  Registered Nurse, Physical Therapy Contact information: [address] [place] [address] [phone]        Will Meredith Leeds, MD. Schedule an appointment as soon as possible for a visit in 3 week(s).   Specialty:  Cardiology Contact information: [address] [place] [address] [phone]          Allergies  Allergen Reactions  . Amiodarone Shortness Of Breath    Stopped in Feb 2017 secondary to pulmonary complications  . Benicar [Olmesartan]     Unclear of the intolerance  . Bystolic [Nebivolol Hcl] Other (See Comments)    unknown  . Clonidine Derivatives Other (See Comments)    unknown  . Exforge [Amlodipine Besylate-Valsartan] Other (See Comments)    Unclear  . Hctz [Hydrochlorothiazide]     Currently taking without problem; question if this has to do with hypokalemia  . Hydralazine Other (See Comments)    Also tolerated, but had orthostatic changes on standing medication  . Lisinopril Other (See Comments)    unknown  . Multaq [Dronedarone] Nausea And Vomiting  . Rythmol [Propafenone] Other (See Comments)    unknown  . Statins Other (See Comments)    unknown  . Tekturna [Aliskiren] Other (See Comments)    unknown  . Coreg [Carvedilol] Other (See Comments)    fatigue  . Prednisone     Jittery     Consultations:  Cardiology  Procedures/Studies:  Dg Chest 2 View  Result Date: 05/16/2016 CLINICAL DATA:  82 year old female with sensation of fluid build up for 2-3 weeks. Suspected CHF. Initial encounter. EXAM: CHEST  2 VIEW COMPARISON:  03/29/2016 and earlier. FINDINGS: Increased conspicuity of moderate to large gastric hiatal hernia containing an air-fluid level today. Stable mediastinal contours otherwise, with evidence of cardiomegaly. Mildly increased pulmonary vascularity. New small right pleural effusion. No pneumothorax. No consolidation. Calcified aortic atherosclerosis. Visualized tracheal air column is within normal limits. Osteopenia. Exaggerated thoracic kyphosis. No acute osseous abnormality identified. Stable cholecystectomy clips. IMPRESSION: 1. Mild pulmonary interstitial edema and new small right pleural effusions. 2. Increased conspicuity of moderate to large gastric hiatal hernia, containing  an air-fluid level today. 3.  Calcified aortic atherosclerosis. Electronically Signed   By: Genevie Ann M.D.   On: 05/16/2016 19:26      Subjective: - no chest pain, shortness of breath, no abdominal pain, nausea or vomiting.   Discharge Exam: Vitals:   05/21/16 0953 05/21/16 1141  BP: (!) 114/59 (!) 145/78  Pulse: 98 92  Resp:  20  Temp:  97.7 F (36.5 C)   Vitals:   05/21/16 0451 05/21/16 0750 05/21/16 0953 05/21/16 1141  BP: 126/68 126/62 (!) 114/59 (!) 145/78  Pulse: 73 93 98 92  Resp: 20 18  20   Temp: 97.9 F (36.6 C) 97.5 F (36.4 C)  97.7 F (36.5 C)  TempSrc: Oral Oral  Oral  SpO2: 93% 97% 93% 98%  Weight: 61.5 kg (135 lb 8 oz)     Height:        General: Pt is alert, awake, not in acute distress Cardiovascular: irregular Respiratory: CTA bilaterally, no wheezing, no rhonchi Abdominal: Soft, NT, ND, bowel sounds + Extremities: no edema, no cyanosis    The results of significant diagnostics from this hospitalization (including imaging, microbiology, ancillary and laboratory) are listed  below for reference.     Microbiology: No results found for this or any previous visit (from the past 240 hour(s)).   Labs: BNP (last 3 results)  Recent Labs  03/29/16 1625 05/16/16 1832  BNP 699.6* [id number]*   Basic Metabolic Panel:  Recent Labs Lab 05/17/16 0332 05/18/16 0256 05/19/16 0306 05/20/16 0338 05/21/16 0538  NA 145 142 142 141 141  K 3.1* 3.5 3.0* 3.6 4.0  CL 112* 109 106 105 106  CO2 24 25 27 26 26   GLUCOSE 96 100* 93 90 91  BUN 15 14 13 17 15   CREATININE 0.75 0.75 0.73 0.83 0.79  CALCIUM 9.1 9.1 9.0 9.0 9.2   CBC:  Recent Labs Lab 05/16/16 1834  WBC 5.7  HGB 10.9*  HCT 34.3*  MCV 78.0  PLT 284   Urinalysis    Component Value Date/Time   COLORURINE YELLOW 08/23/2010 1257   APPEARANCEUR CLEAR 08/23/2010 1257   LABSPEC 1.018 08/23/2010 1257   PHURINE 5.5 08/23/2010 1257   GLUCOSEU NEGATIVE 08/23/2010 1257   HGBUR NEGATIVE 08/23/2010  1257   BILIRUBINUR NEGATIVE 08/23/2010 1257   KETONESUR NEGATIVE 08/23/2010 1257   PROTEINUR NEGATIVE 08/23/2010 1257   UROBILINOGEN 0.2 08/23/2010 1257   NITRITE NEGATIVE 08/23/2010 1257   LEUKOCYTESUR  08/23/2010 1257    NEGATIVE MICROSCOPIC NOT DONE ON URINES WITH NEGATIVE PROTEIN, BLOOD, LEUKOCYTES, NITRITE, OR GLUCOSE <1000 mg/dL.   Sepsis Labs Invalid input(s): PROCALCITONIN,  WBC,  LACTICIDVEN Microbiology No results found for this or any previous visit (from the past 240 hour(s)).   Time coordinating discharge: Over 30 minutes  SIGNED:  Marzetta Board, MD  [facility] 05/21/2016, 2:02 PM Pager [phone]  If 7PM-7AM, please contact night-coverage [email or web address] Password [email or web address]

## 2016-05-21 NOTE — Progress Notes (Signed)
Patient Name: Sara [name] Date of Encounter: 05/21/2016  Primary Cardiologist: Dr. Beckie Salts Problem List     Principal Problem:   Acute on chronic diastolic CHF (congestive heart failure) ([place]) Active Problems:   Essential hypertension   Paroxysmal atrial fibrillation (HCC)   CHF (congestive heart failure) (HCC)   Hypokalemia     Subjective   Another 1L negative overnight.  Weight now 135 lbs.  Creatinine appears stable.   Inpatient Medications    Scheduled Meds: . anastrozole  1 mg Oral [id number]  . apixaban  2.5 mg Oral BID  . diltiazem  120 mg Oral Daily  . furosemide  40 mg Oral Daily  . metoprolol  50 mg Oral BID WC  . potassium chloride  40 mEq Oral Daily  . rOPINIRole  1 mg Oral QHS  . sodium chloride flush  3 mL Intravenous Q12H   Continuous Infusions:  PRN Meds: sodium chloride, acetaminophen, ALPRAZolam, ondansetron (ZOFRAN) IV, sodium chloride flush   Vital Signs    Vitals:   05/20/16 1953 05/21/16 0451 05/21/16 0750 05/21/16 0953  BP: (!) 118/59 126/68 126/62 (!) 114/59  Pulse: 71 73 93 98  Resp: 20 20 18    Temp: 97.9 F (36.6 C) 97.9 F (36.6 C) 97.5 F (36.4 C)   TempSrc: Oral Oral Oral   SpO2: 96% 93% 97% 93%  Weight:  135 lb 8 oz (61.5 kg)    Height:        Intake/Output Summary (Last 24 hours) at 05/21/16 1121 Last data filed at 05/21/16 0916  Gross per 24 hour  Intake             1123 ml  Output             1451 ml  Net             -328 ml   Filed Weights   05/19/16 1826 05/20/16 0511 05/21/16 0451  Weight: 139 lb 9.6 oz (63.3 kg) 138 lb 8 oz (62.8 kg) 135 lb 8 oz (61.5 kg)    Physical Exam    GEN: Well nourished, well developed, elderly female in no acute distress.  HEENT: Grossly normal.  Neck: Supple, no JVD, carotid bruits, or masses. Cardiac: iRRR, no murmurs, rubs, or gallops. No clubbing, cyanosis, edema.  Radials/DP/PT 2+ and equal bilaterally.  Respiratory:  Respirations regular and unlabored, clear to  auscultation bilaterally. GI: Soft, nontender, nondistended, BS + x 4. MS: no deformity or atrophy. Skin: warm and dry, no rash. Neuro:  Strength and sensation are intact. Psych: AAOx3.  Normal affect.  Labs    CBC No results for input(s): WBC, NEUTROABS, HGB, HCT, MCV, PLT in the last 72 hours. Basic Metabolic Panel  Recent Labs  05/20/16 0338 05/21/16 0538  NA 141 141  K 3.6 4.0  CL 105 106  CO2 26 26  GLUCOSE 90 91  BUN 17 15  CREATININE 0.83 0.79  CALCIUM 9.0 9.2     Telemetry    Afib - Personally Reviewed   Radiology    No results found.  Cardiac Studies   Transthoracic Echocardiography  Study Conclusions  - Left ventricle: The cavity size was normal. Wall thickness was   increased in a pattern of mild LVH. Systolic function was   vigorous. The estimated ejection fraction was in the range of 65%   to 70%. Wall motion was normal; there were no regional wall   motion abnormalities. Doppler parameters are  consistent with high   ventricular filling pressure. - Aortic valve: There was trivial regurgitation. - Mitral valve: Calcified annulus. Mildly thickened leaflets . - Left atrium: The atrium was severely dilated. - Right atrium: The atrium was mildly dilated. - Pulmonary arteries: Systolic pressure was mildly increased. PA   peak pressure: 37 mm Hg (S).  Impressions:  - Vigorous LV systolic function with intracavitary gradient of 1.7   m/s; mild LVH; elevated LV filling pressure; biatrial   enlargement; trace AI; mild TR with mildly elevated pulmonary   pressure.   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 Recently admitted from 03/29/16-04/01/16 for rapid Afib, had successful TEE/DCCV. Admitted on 05/16/16 with dyspnea and BLE edema, in atrial fib.      Assessment & Plan  1. Paroxsymal atrial  fibrillation: patient presents with palpitations, SOB and bilateral lower extremity edema. 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 She has previously been intolerant to rythmol and Multaq. Also, noted to have prolonged QTc at .531, but has a RBBBso likely that her QTc is falsely prolonged but stillnot a candidate for Tikosyn. Left atrium severely dilated by Echo last month.   Also, refused LINQ last admission. Currently on po diltiazem 120mg  and Eliquis 2.5mg . She was on 25mg  metoprolol when she was seen in the Afib clinic however, this was reduced per patient request. Now on 50mg  metoprolol BID while admitted and has rates in the 90's - 100's with very short runs of rapid Afib in the 120 bpm range.   She is likely asymptomatic with the proper amount of rate control, but the challenge is her compliance to medications as I suspect there is an aspect of dementia here.   EP has seen and recommends to continue her beta blocker and diltiazem. She wants to go home today, I think she is fine to go on 50mg  Metoprolol and 120mg  diltiazem.   2. Chronic diastolic CHF: Looking more stable from a volume standpoint. Switch to po lasix 40mg  daily today.   3. History of interstitial lung disease: Followed by Pulmonology, Dr. Melvyn Novas has seen her recently, she is stable.   4. HTN: BP stable.   5. Anxiety: Suspect that her anxiety is playing a role here, she is worried this morning that her medicines are causing facial swelling. I do not see any swelling on exam. She was also up the entire night. Has pressured speech this am. Will give a dose of Xanax this am. Consider adding it hs for sleep.   Ok for d/c home from a cardiac standpoint today. Follow-up with Dr. Ellyn Hack after discharge.   Pixie Casino, MD, [facility] Attending Cardiologist [place], MD  05/21/2016, 11:21 AM

## 2016-05-21 NOTE — Progress Notes (Signed)
Patient alert and oriented, denies pain, no shortness of breath. D/c instruction explain and given to the patient son, all questions answered. Iv and tele d/c. Patient d/c home with son per order

## 2016-05-23 DIAGNOSIS — I480 Paroxysmal atrial fibrillation: Secondary | ICD-10-CM | POA: Diagnosis not present

## 2016-05-23 DIAGNOSIS — I110 Hypertensive heart disease with heart failure: Secondary | ICD-10-CM | POA: Diagnosis not present

## 2016-05-23 DIAGNOSIS — Z79811 Long term (current) use of aromatase inhibitors: Secondary | ICD-10-CM | POA: Diagnosis not present

## 2016-05-23 DIAGNOSIS — R001 Bradycardia, unspecified: Secondary | ICD-10-CM | POA: Diagnosis not present

## 2016-05-23 DIAGNOSIS — Z7901 Long term (current) use of anticoagulants: Secondary | ICD-10-CM | POA: Diagnosis not present

## 2016-05-23 DIAGNOSIS — Z9181 History of falling: Secondary | ICD-10-CM | POA: Diagnosis not present

## 2016-05-23 DIAGNOSIS — I5032 Chronic diastolic (congestive) heart failure: Secondary | ICD-10-CM | POA: Diagnosis not present

## 2016-05-23 DIAGNOSIS — I495 Sick sinus syndrome: Secondary | ICD-10-CM | POA: Diagnosis not present

## 2016-05-24 DIAGNOSIS — I110 Hypertensive heart disease with heart failure: Secondary | ICD-10-CM | POA: Diagnosis not present

## 2016-05-24 DIAGNOSIS — I495 Sick sinus syndrome: Secondary | ICD-10-CM | POA: Diagnosis not present

## 2016-05-24 DIAGNOSIS — Z7901 Long term (current) use of anticoagulants: Secondary | ICD-10-CM | POA: Diagnosis not present

## 2016-05-24 DIAGNOSIS — Z9181 History of falling: Secondary | ICD-10-CM | POA: Diagnosis not present

## 2016-05-24 DIAGNOSIS — I5032 Chronic diastolic (congestive) heart failure: Secondary | ICD-10-CM | POA: Diagnosis not present

## 2016-05-24 DIAGNOSIS — Z79811 Long term (current) use of aromatase inhibitors: Secondary | ICD-10-CM | POA: Diagnosis not present

## 2016-05-24 DIAGNOSIS — R001 Bradycardia, unspecified: Secondary | ICD-10-CM | POA: Diagnosis not present

## 2016-05-24 DIAGNOSIS — I480 Paroxysmal atrial fibrillation: Secondary | ICD-10-CM | POA: Diagnosis not present

## 2016-05-25 DIAGNOSIS — I110 Hypertensive heart disease with heart failure: Secondary | ICD-10-CM | POA: Diagnosis not present

## 2016-05-25 DIAGNOSIS — Z7901 Long term (current) use of anticoagulants: Secondary | ICD-10-CM | POA: Diagnosis not present

## 2016-05-25 DIAGNOSIS — I480 Paroxysmal atrial fibrillation: Secondary | ICD-10-CM | POA: Diagnosis not present

## 2016-05-25 DIAGNOSIS — I495 Sick sinus syndrome: Secondary | ICD-10-CM | POA: Diagnosis not present

## 2016-05-25 DIAGNOSIS — Z79811 Long term (current) use of aromatase inhibitors: Secondary | ICD-10-CM | POA: Diagnosis not present

## 2016-05-25 DIAGNOSIS — Z9181 History of falling: Secondary | ICD-10-CM | POA: Diagnosis not present

## 2016-05-25 DIAGNOSIS — R001 Bradycardia, unspecified: Secondary | ICD-10-CM | POA: Diagnosis not present

## 2016-05-25 DIAGNOSIS — I5032 Chronic diastolic (congestive) heart failure: Secondary | ICD-10-CM | POA: Diagnosis not present

## 2016-05-30 DIAGNOSIS — I5032 Chronic diastolic (congestive) heart failure: Secondary | ICD-10-CM | POA: Diagnosis not present

## 2016-05-30 DIAGNOSIS — I480 Paroxysmal atrial fibrillation: Secondary | ICD-10-CM | POA: Diagnosis not present

## 2016-05-30 DIAGNOSIS — I110 Hypertensive heart disease with heart failure: Secondary | ICD-10-CM | POA: Diagnosis not present

## 2016-05-30 DIAGNOSIS — I495 Sick sinus syndrome: Secondary | ICD-10-CM | POA: Diagnosis not present

## 2016-05-30 DIAGNOSIS — Z7901 Long term (current) use of anticoagulants: Secondary | ICD-10-CM | POA: Diagnosis not present

## 2016-05-30 DIAGNOSIS — Z9181 History of falling: Secondary | ICD-10-CM | POA: Diagnosis not present

## 2016-05-30 DIAGNOSIS — Z79811 Long term (current) use of aromatase inhibitors: Secondary | ICD-10-CM | POA: Diagnosis not present

## 2016-05-30 DIAGNOSIS — R001 Bradycardia, unspecified: Secondary | ICD-10-CM | POA: Diagnosis not present

## 2016-06-02 DIAGNOSIS — R001 Bradycardia, unspecified: Secondary | ICD-10-CM | POA: Diagnosis not present

## 2016-06-02 DIAGNOSIS — Z7901 Long term (current) use of anticoagulants: Secondary | ICD-10-CM | POA: Diagnosis not present

## 2016-06-02 DIAGNOSIS — I110 Hypertensive heart disease with heart failure: Secondary | ICD-10-CM | POA: Diagnosis not present

## 2016-06-02 DIAGNOSIS — Z9181 History of falling: Secondary | ICD-10-CM | POA: Diagnosis not present

## 2016-06-02 DIAGNOSIS — I495 Sick sinus syndrome: Secondary | ICD-10-CM | POA: Diagnosis not present

## 2016-06-02 DIAGNOSIS — I5032 Chronic diastolic (congestive) heart failure: Secondary | ICD-10-CM | POA: Diagnosis not present

## 2016-06-02 DIAGNOSIS — I480 Paroxysmal atrial fibrillation: Secondary | ICD-10-CM | POA: Diagnosis not present

## 2016-06-02 DIAGNOSIS — Z79811 Long term (current) use of aromatase inhibitors: Secondary | ICD-10-CM | POA: Diagnosis not present

## 2016-06-05 ENCOUNTER — Other Ambulatory Visit: Payer: Self-pay | Admitting: Cardiology

## 2016-06-06 ENCOUNTER — Other Ambulatory Visit: Payer: Self-pay | Admitting: *Deleted

## 2016-06-06 DIAGNOSIS — I4891 Unspecified atrial fibrillation: Secondary | ICD-10-CM | POA: Diagnosis not present

## 2016-06-06 MED ORDER — APIXABAN 2.5 MG PO TABS: 2.5000 mg | ORAL_TABLET | Freq: Two times a day (BID) | ORAL | 6 refills | Status: DC

## 2016-06-07 DIAGNOSIS — I110 Hypertensive heart disease with heart failure: Secondary | ICD-10-CM | POA: Diagnosis not present

## 2016-06-07 DIAGNOSIS — Z7901 Long term (current) use of anticoagulants: Secondary | ICD-10-CM | POA: Diagnosis not present

## 2016-06-07 DIAGNOSIS — R001 Bradycardia, unspecified: Secondary | ICD-10-CM | POA: Diagnosis not present

## 2016-06-07 DIAGNOSIS — I480 Paroxysmal atrial fibrillation: Secondary | ICD-10-CM | POA: Diagnosis not present

## 2016-06-07 DIAGNOSIS — Z79811 Long term (current) use of aromatase inhibitors: Secondary | ICD-10-CM | POA: Diagnosis not present

## 2016-06-07 DIAGNOSIS — I495 Sick sinus syndrome: Secondary | ICD-10-CM | POA: Diagnosis not present

## 2016-06-07 DIAGNOSIS — Z9181 History of falling: Secondary | ICD-10-CM | POA: Diagnosis not present

## 2016-06-07 DIAGNOSIS — I5032 Chronic diastolic (congestive) heart failure: Secondary | ICD-10-CM | POA: Diagnosis not present

## 2016-06-09 DIAGNOSIS — Z7901 Long term (current) use of anticoagulants: Secondary | ICD-10-CM | POA: Diagnosis not present

## 2016-06-09 DIAGNOSIS — R001 Bradycardia, unspecified: Secondary | ICD-10-CM | POA: Diagnosis not present

## 2016-06-09 DIAGNOSIS — I110 Hypertensive heart disease with heart failure: Secondary | ICD-10-CM | POA: Diagnosis not present

## 2016-06-09 DIAGNOSIS — I495 Sick sinus syndrome: Secondary | ICD-10-CM | POA: Diagnosis not present

## 2016-06-09 DIAGNOSIS — I5032 Chronic diastolic (congestive) heart failure: Secondary | ICD-10-CM | POA: Diagnosis not present

## 2016-06-09 DIAGNOSIS — I480 Paroxysmal atrial fibrillation: Secondary | ICD-10-CM | POA: Diagnosis not present

## 2016-06-09 DIAGNOSIS — Z9181 History of falling: Secondary | ICD-10-CM | POA: Diagnosis not present

## 2016-06-09 DIAGNOSIS — Z79811 Long term (current) use of aromatase inhibitors: Secondary | ICD-10-CM | POA: Diagnosis not present

## 2016-06-13 DIAGNOSIS — R001 Bradycardia, unspecified: Secondary | ICD-10-CM | POA: Diagnosis not present

## 2016-06-13 DIAGNOSIS — I480 Paroxysmal atrial fibrillation: Secondary | ICD-10-CM | POA: Diagnosis not present

## 2016-06-13 DIAGNOSIS — I5032 Chronic diastolic (congestive) heart failure: Secondary | ICD-10-CM | POA: Diagnosis not present

## 2016-06-13 DIAGNOSIS — Z79811 Long term (current) use of aromatase inhibitors: Secondary | ICD-10-CM | POA: Diagnosis not present

## 2016-06-13 DIAGNOSIS — Z7901 Long term (current) use of anticoagulants: Secondary | ICD-10-CM | POA: Diagnosis not present

## 2016-06-13 DIAGNOSIS — Z9181 History of falling: Secondary | ICD-10-CM | POA: Diagnosis not present

## 2016-06-13 DIAGNOSIS — I110 Hypertensive heart disease with heart failure: Secondary | ICD-10-CM | POA: Diagnosis not present

## 2016-06-13 DIAGNOSIS — I495 Sick sinus syndrome: Secondary | ICD-10-CM | POA: Diagnosis not present

## 2016-06-15 DIAGNOSIS — I480 Paroxysmal atrial fibrillation: Secondary | ICD-10-CM | POA: Diagnosis not present

## 2016-06-15 DIAGNOSIS — Z9181 History of falling: Secondary | ICD-10-CM | POA: Diagnosis not present

## 2016-06-15 DIAGNOSIS — Z79811 Long term (current) use of aromatase inhibitors: Secondary | ICD-10-CM | POA: Diagnosis not present

## 2016-06-15 DIAGNOSIS — I5032 Chronic diastolic (congestive) heart failure: Secondary | ICD-10-CM | POA: Diagnosis not present

## 2016-06-15 DIAGNOSIS — R001 Bradycardia, unspecified: Secondary | ICD-10-CM | POA: Diagnosis not present

## 2016-06-15 DIAGNOSIS — I495 Sick sinus syndrome: Secondary | ICD-10-CM | POA: Diagnosis not present

## 2016-06-15 DIAGNOSIS — Z7901 Long term (current) use of anticoagulants: Secondary | ICD-10-CM | POA: Diagnosis not present

## 2016-06-15 DIAGNOSIS — I110 Hypertensive heart disease with heart failure: Secondary | ICD-10-CM | POA: Diagnosis not present

## 2016-06-21 DIAGNOSIS — R001 Bradycardia, unspecified: Secondary | ICD-10-CM | POA: Diagnosis not present

## 2016-06-21 DIAGNOSIS — Z7901 Long term (current) use of anticoagulants: Secondary | ICD-10-CM | POA: Diagnosis not present

## 2016-06-21 DIAGNOSIS — I480 Paroxysmal atrial fibrillation: Secondary | ICD-10-CM | POA: Diagnosis not present

## 2016-06-21 DIAGNOSIS — Z9181 History of falling: Secondary | ICD-10-CM | POA: Diagnosis not present

## 2016-06-21 DIAGNOSIS — I5033 Acute on chronic diastolic (congestive) heart failure: Secondary | ICD-10-CM | POA: Diagnosis not present

## 2016-06-21 DIAGNOSIS — I495 Sick sinus syndrome: Secondary | ICD-10-CM | POA: Diagnosis not present

## 2016-06-21 DIAGNOSIS — Z79811 Long term (current) use of aromatase inhibitors: Secondary | ICD-10-CM | POA: Diagnosis not present

## 2016-06-21 DIAGNOSIS — I110 Hypertensive heart disease with heart failure: Secondary | ICD-10-CM | POA: Diagnosis not present

## 2016-06-21 DIAGNOSIS — Z6827 Body mass index (BMI) 27.0-27.9, adult: Secondary | ICD-10-CM | POA: Diagnosis not present

## 2016-06-28 DIAGNOSIS — Z9181 History of falling: Secondary | ICD-10-CM | POA: Diagnosis not present

## 2016-06-28 DIAGNOSIS — I480 Paroxysmal atrial fibrillation: Secondary | ICD-10-CM | POA: Diagnosis not present

## 2016-06-28 DIAGNOSIS — I495 Sick sinus syndrome: Secondary | ICD-10-CM | POA: Diagnosis not present

## 2016-06-28 DIAGNOSIS — Z7901 Long term (current) use of anticoagulants: Secondary | ICD-10-CM | POA: Diagnosis not present

## 2016-06-28 DIAGNOSIS — I5033 Acute on chronic diastolic (congestive) heart failure: Secondary | ICD-10-CM | POA: Diagnosis not present

## 2016-06-28 DIAGNOSIS — Z79811 Long term (current) use of aromatase inhibitors: Secondary | ICD-10-CM | POA: Diagnosis not present

## 2016-06-28 DIAGNOSIS — R001 Bradycardia, unspecified: Secondary | ICD-10-CM | POA: Diagnosis not present

## 2016-06-28 DIAGNOSIS — Z6827 Body mass index (BMI) 27.0-27.9, adult: Secondary | ICD-10-CM | POA: Diagnosis not present

## 2016-06-28 DIAGNOSIS — I110 Hypertensive heart disease with heart failure: Secondary | ICD-10-CM | POA: Diagnosis not present

## 2016-06-29 ENCOUNTER — Encounter: Payer: Self-pay | Admitting: Cardiology

## 2016-06-29 ENCOUNTER — Ambulatory Visit (INDEPENDENT_AMBULATORY_CARE_PROVIDER_SITE_OTHER): Payer: Medicare HMO | Admitting: Cardiology

## 2016-06-29 VITALS — BP 146/72 | HR 90 | Ht 60.0 in | Wt 147.6 lb

## 2016-06-29 DIAGNOSIS — I480 Paroxysmal atrial fibrillation: Secondary | ICD-10-CM

## 2016-06-29 MED ORDER — POTASSIUM CHLORIDE CRYS ER 20 MEQ PO TBCR: 20.0000 meq | EXTENDED_RELEASE_TABLET | Freq: Two times a day (BID) | ORAL | 6 refills | Status: DC

## 2016-06-29 MED ORDER — FUROSEMIDE 40 MG PO TABS: 40.0000 mg | ORAL_TABLET | Freq: Two times a day (BID) | ORAL | 6 refills | Status: DC

## 2016-06-29 NOTE — Patient Instructions (Addendum)
Medication Instructions:    Your physician has recommended you make the following change in your medication:  1) INCREASE Lasix to 40 mg twice a day 2) INCREASE Potassium to 20 mEq twice a day  --- If you need a refill on your cardiac medications before your next appointment, please call your pharmacy. ---  Labwork:  None ordered  Testing/Procedures:  None ordered  Follow-Up:  Your physician wants you to follow-up in: 6 months with Dr. Curt Bears.  You will receive a reminder letter in the mail two months in advance. If you don't receive a letter, please call our office to schedule the follow-up appointment.  Thank you for choosing [facility]!!   Trinidad Curet, RN [phone]

## 2016-06-29 NOTE — Progress Notes (Signed)
Electrophysiology Office Note   Date:  06/29/2016   ID:  Sara [name], DOB 03-03-1926, MRN [id number]  PCP:  Townsend Roger, MD  Cardiologist:  Ellyn Hack Primary Electrophysiologist:  Tally Mattox Meredith Leeds, MD    Chief Complaint  Patient presents with  . Follow-up    PAF     History of Present Illness: Sara [name] is a 82 y.o. female who presents today for electrophysiology evaluation.   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 Recently admitted from 03/29/16-04/01/16 for rapid Afib, had successful TEE/DCCV. Admitted on 05/16/16 with dyspnea and BLE edema, in atrial fib. Linq monitor was Suggested, but the patient refused. TEE and cardioversion with reversion to sinus rhythm. Her to her hospitalization, she refused titration of rate controlling medications that these are the causes of her symptoms. Her LA is severely dilated. Her QTc was 531 with a RBBB.   Today, she denies symptoms of chest pain, orthopnea, PND, lower extremity edema, claudication, dizziness, presyncope, syncope, bleeding, or neurologic sequela. The patient is tolerating medications without difficulties. Has had episodes of LE swelling that has been present since her hospital discharge. She has gained 14 lbs since that time.     Past Medical History:  Diagnosis Date  . Ankle edema    Mild, which is intermittent, usually mildly dependent, left slightly greater than the right.  . Anxiety   . Arthritis    In her knees  . Balance problem    Due to weakened knee  . CHF (congestive heart failure) ([place])   . Corneal edema    Severe corneal edema in right eye with multiple folds. Developed after cataract surgery 05/03/09  . GERD (gastroesophageal reflux disease)   . H/O echocardiogram    Echo 03/01/11 EF = >55%. Shows normal systolic function with mild to moderate LV hypertrophy. Left atrial dimension 3.8 cm & a pulmonary artery  pressure of 38. There was breakthrough diastolic dysfunction.  . Hematuria    Resolved after coumadin was stopped  . Hiatal hernia    With GERD  . Hyperglycemia    Random, without any history of diabetes  . Hyperlipidemia   . Hypertension    Difficult to control. Renal Doppler 06/02/10 showed less that 60% bilateral renal artery narrowing.  . Hypokalemia   . Paroxysmal atrial fibrillation (HCC)    On amiodarone but not on anticoagulant  . Posthemorrhagic anemia   . Pseudophakia   . Syncope 02/22/11   During OV on 02/22/11 her INR was 7.3 and was given 2.5 mg of vitamin K. Later that day she became diaphoretic & fainted.  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 BREAST BIOPSY    . CARDIOVERSION N/A 03/31/2016   Procedure: CARDIOVERSION;  Surgeon: Jerline Pain, MD;  Location: [facility];  Service: Cardiovascular;  Laterality: N/A;  . CATARACT EXTRACTION  05/03/09  . CHOLECYSTECTOMY    . HEMORRHOID SURGERY    . Remote hysterectomy    . TEE WITHOUT CARDIOVERSION N/A 03/31/2016   Procedure: TRANSESOPHAGEAL ECHOCARDIOGRAM (TEE);  Surgeon: Jerline Pain, MD;  Location: [facility] ENDOSCOPY;  Service: Cardiovascular;  Laterality: N/A;  . TONSILLECTOMY    . TOTAL KNEE ARTHROPLASTY Right 08/26/10   By Dr. Elta Guadeloupe C. Yates. Cemented, computer assist     Current Outpatient Prescriptions  Medication Sig Dispense Refill  . anastrozole (  ARIMIDEX) 1 MG tablet Take 1 mg by mouth every morning.     Marland Kitchen apixaban (ELIQUIS) 2.5 MG TABS tablet Take 1 tablet (2.5 mg total) by mouth 2 (two) times daily. 60 tablet 6  . Cholecalciferol (VITAMIN D3) 1000 units CAPS Take 1 tablet by mouth daily.  3  . diltiazem (CARDIZEM CD) 120 MG 24 hr capsule Take 1 capsule (120 mg total) by mouth daily. 30 capsule 6  . furosemide (LASIX) 40 MG tablet Take 1 tablet (40 mg total) by mouth 2 (two) times daily. 60 tablet 6  . metoprolol (LOPRESSOR) 50 MG tablet Take 50 mg by  mouth 2 (two) times daily with a meal.  2  . potassium chloride SA (K-DUR,KLOR-CON) 20 MEQ tablet Take 1 tablet (20 mEq total) by mouth 2 (two) times daily. 60 tablet 6  . rOPINIRole (REQUIP) 1 MG tablet Take 1 tablet by mouth at bedtime.   3   No current facility-administered medications for this visit.     Allergies:   Amiodarone; Benicar [olmesartan]; Bystolic [nebivolol hcl]; Clonidine derivatives; Exforge [amlodipine besylate-valsartan]; Hctz [hydrochlorothiazide]; Hydralazine; Lisinopril; Multaq [dronedarone]; Rythmol [propafenone]; Statins; Tekturna [aliskiren]; Coreg [carvedilol]; and Prednisone   Social History:  The patient  reports that she has never smoked. She has never used smokeless tobacco. She reports that she does not drink alcohol or use drugs.   Family History:  The patient's family history includes Cancer in her mother; Coronary artery disease in her brother; Diabetes type II in her brother; Heart attack in her sister; Hypertension in her brother, mother, and sister.    ROS:  Please see the history of present illness.   Otherwise, review of systems is positive for weight change, leg swelling, SOB at night, palpitations, hearing loss, cough, snoring, abdominal pain, anxiety, muscle pain, back pain.   All other systems are reviewed and negative.    PHYSICAL EXAM: VS:  BP (!) 146/72   Pulse 90   Ht 5' (1.524 m)   Wt 147 lb 9.6 oz (67 kg)   BMI 28.83 kg/m  , BMI Body mass index is 28.83 kg/m. GEN: Well nourished, well developed, in no acute distress  HEENT: normal  Neck: no JVD, carotid bruits, or masses Cardiac: RRR; no murmurs, rubs, or gallops, 2+ edema  Respiratory:  clear to auscultation bilaterally, normal work of breathing GI: soft, nontender, nondistended, + BS MS: no deformity or atrophy  Skin: warm and dry Neuro:  Strength and sensation are intact Psych: euthymic mood, full affect  EKG:  EKG is not ordered today. Personal review of the ekg ordered  05/17/16 shows atrial fibrillaiton, RBBB, rate 102  Recent Labs: 05/16/2016: B Natriuretic Peptide 362.3; Hemoglobin 10.9; Platelets 284 05/21/2016: BUN 15; Creatinine, Ser 0.79; Potassium 4.0; Sodium 141    Lipid Panel  No results found for: CHOL, TRIG, HDL, CHOLHDL, VLDL, LDLCALC, LDLDIRECT   Wt Readings from Last 3 Encounters:  06/29/16 147 lb 9.6 oz (67 kg)  05/21/16 135 lb 8 oz (61.5 kg)  04/27/16 150 lb 12.8 oz (68.4 kg)      Other studies Reviewed: Additional studies/ records that were reviewed today include: TTE 03/30/16  Review of the above records today demonstrates:  - Left ventricle: The cavity size was normal. Wall thickness was   increased in a pattern of mild LVH. Systolic function was   vigorous. The estimated ejection fraction was in the range of 65%   to 70%. Wall motion was normal; there were no regional wall  motion abnormalities. Doppler parameters are consistent with high   ventricular filling pressure. - Aortic valve: There was trivial regurgitation. - Mitral valve: Calcified annulus. Mildly thickened leaflets . - Left atrium: The atrium was severely dilated. - Right atrium: The atrium was mildly dilated. - Pulmonary arteries: Systolic pressure was mildly increased. PA   peak pressure: 37 mm Hg (S).   ASSESSMENT AND PLAN:  1.  Persistent atrial fibrillation: he has a severely dilated left atrium and thus he returned sinus rhythm  She has not tolerated many medications Sara [name] aim for a rate control strategy and Sara [name] continue diltiazem and metoprolol. She is on Eliquis for stroke prevention.  2. Diastolic HF: Her weight is significantly elevated with LE edema.  Plan to increase lasix to 40 mg BID. She Sara [name] continue to weigh herself and call us back if she is not losing weight.  3. Hypertension: Elevated today, continue to monitor.    Current medicines are reviewed at length with the patient today.   The patient does not have concerns regarding her  medicines.  The following changes were made today:  none  Labs/ tests ordered today include:  No orders of the defined types were placed in this encounter.    Disposition:   FU with Sara [name] 6 months  Signed, Sara Humphreys Meredith Leeds, MD  06/29/2016 4:54 PM     [address] [place] [address] [phone] (office) [phone] (fax)

## 2016-07-06 ENCOUNTER — Ambulatory Visit: Payer: Commercial Managed Care - HMO | Admitting: Cardiology

## 2016-07-07 DIAGNOSIS — I480 Paroxysmal atrial fibrillation: Secondary | ICD-10-CM | POA: Diagnosis not present

## 2016-07-07 DIAGNOSIS — Z9181 History of falling: Secondary | ICD-10-CM | POA: Diagnosis not present

## 2016-07-07 DIAGNOSIS — Z7901 Long term (current) use of anticoagulants: Secondary | ICD-10-CM | POA: Diagnosis not present

## 2016-07-07 DIAGNOSIS — R001 Bradycardia, unspecified: Secondary | ICD-10-CM | POA: Diagnosis not present

## 2016-07-07 DIAGNOSIS — I5033 Acute on chronic diastolic (congestive) heart failure: Secondary | ICD-10-CM | POA: Diagnosis not present

## 2016-07-07 DIAGNOSIS — I495 Sick sinus syndrome: Secondary | ICD-10-CM | POA: Diagnosis not present

## 2016-07-07 DIAGNOSIS — I110 Hypertensive heart disease with heart failure: Secondary | ICD-10-CM | POA: Diagnosis not present

## 2016-07-07 DIAGNOSIS — Z6827 Body mass index (BMI) 27.0-27.9, adult: Secondary | ICD-10-CM | POA: Diagnosis not present

## 2016-07-07 DIAGNOSIS — Z79811 Long term (current) use of aromatase inhibitors: Secondary | ICD-10-CM | POA: Diagnosis not present

## 2016-07-10 DIAGNOSIS — I4891 Unspecified atrial fibrillation: Secondary | ICD-10-CM | POA: Diagnosis not present

## 2016-07-10 DIAGNOSIS — I519 Heart disease, unspecified: Secondary | ICD-10-CM | POA: Diagnosis not present

## 2016-07-11 DIAGNOSIS — I480 Paroxysmal atrial fibrillation: Secondary | ICD-10-CM | POA: Diagnosis not present

## 2016-07-11 DIAGNOSIS — R001 Bradycardia, unspecified: Secondary | ICD-10-CM | POA: Diagnosis not present

## 2016-07-11 DIAGNOSIS — I495 Sick sinus syndrome: Secondary | ICD-10-CM | POA: Diagnosis not present

## 2016-07-11 DIAGNOSIS — I5033 Acute on chronic diastolic (congestive) heart failure: Secondary | ICD-10-CM | POA: Diagnosis not present

## 2016-07-11 DIAGNOSIS — Z7901 Long term (current) use of anticoagulants: Secondary | ICD-10-CM | POA: Diagnosis not present

## 2016-07-11 DIAGNOSIS — I110 Hypertensive heart disease with heart failure: Secondary | ICD-10-CM | POA: Diagnosis not present

## 2016-07-11 DIAGNOSIS — Z79811 Long term (current) use of aromatase inhibitors: Secondary | ICD-10-CM | POA: Diagnosis not present

## 2016-07-11 DIAGNOSIS — Z6827 Body mass index (BMI) 27.0-27.9, adult: Secondary | ICD-10-CM | POA: Diagnosis not present

## 2016-07-11 DIAGNOSIS — Z9181 History of falling: Secondary | ICD-10-CM | POA: Diagnosis not present

## 2016-07-13 DIAGNOSIS — Z853 Personal history of malignant neoplasm of breast: Secondary | ICD-10-CM | POA: Diagnosis not present

## 2016-07-13 DIAGNOSIS — Z170 Estrogen receptor positive status [ER+]: Secondary | ICD-10-CM | POA: Diagnosis not present

## 2016-07-13 DIAGNOSIS — C50919 Malignant neoplasm of unspecified site of unspecified female breast: Secondary | ICD-10-CM | POA: Diagnosis not present

## 2016-07-13 DIAGNOSIS — Z79811 Long term (current) use of aromatase inhibitors: Secondary | ICD-10-CM | POA: Diagnosis not present

## 2016-07-19 DIAGNOSIS — Z9181 History of falling: Secondary | ICD-10-CM | POA: Diagnosis not present

## 2016-07-19 DIAGNOSIS — I110 Hypertensive heart disease with heart failure: Secondary | ICD-10-CM | POA: Diagnosis not present

## 2016-07-19 DIAGNOSIS — Z7901 Long term (current) use of anticoagulants: Secondary | ICD-10-CM | POA: Diagnosis not present

## 2016-07-19 DIAGNOSIS — R001 Bradycardia, unspecified: Secondary | ICD-10-CM | POA: Diagnosis not present

## 2016-07-19 DIAGNOSIS — I495 Sick sinus syndrome: Secondary | ICD-10-CM | POA: Diagnosis not present

## 2016-07-19 DIAGNOSIS — I5033 Acute on chronic diastolic (congestive) heart failure: Secondary | ICD-10-CM | POA: Diagnosis not present

## 2016-07-19 DIAGNOSIS — Z79811 Long term (current) use of aromatase inhibitors: Secondary | ICD-10-CM | POA: Diagnosis not present

## 2016-07-19 DIAGNOSIS — I480 Paroxysmal atrial fibrillation: Secondary | ICD-10-CM | POA: Diagnosis not present

## 2016-07-19 DIAGNOSIS — Z6827 Body mass index (BMI) 27.0-27.9, adult: Secondary | ICD-10-CM | POA: Diagnosis not present

## 2016-07-26 DIAGNOSIS — I110 Hypertensive heart disease with heart failure: Secondary | ICD-10-CM | POA: Diagnosis not present

## 2016-07-26 DIAGNOSIS — R001 Bradycardia, unspecified: Secondary | ICD-10-CM | POA: Diagnosis not present

## 2016-07-26 DIAGNOSIS — I480 Paroxysmal atrial fibrillation: Secondary | ICD-10-CM | POA: Diagnosis not present

## 2016-07-26 DIAGNOSIS — I5033 Acute on chronic diastolic (congestive) heart failure: Secondary | ICD-10-CM | POA: Diagnosis not present

## 2016-07-26 DIAGNOSIS — Z9181 History of falling: Secondary | ICD-10-CM | POA: Diagnosis not present

## 2016-07-26 DIAGNOSIS — I495 Sick sinus syndrome: Secondary | ICD-10-CM | POA: Diagnosis not present

## 2016-07-26 DIAGNOSIS — Z6827 Body mass index (BMI) 27.0-27.9, adult: Secondary | ICD-10-CM | POA: Diagnosis not present

## 2016-07-26 DIAGNOSIS — Z79811 Long term (current) use of aromatase inhibitors: Secondary | ICD-10-CM | POA: Diagnosis not present

## 2016-07-26 DIAGNOSIS — Z7901 Long term (current) use of anticoagulants: Secondary | ICD-10-CM | POA: Diagnosis not present

## 2016-07-31 DIAGNOSIS — I480 Paroxysmal atrial fibrillation: Secondary | ICD-10-CM | POA: Diagnosis not present

## 2016-08-03 DIAGNOSIS — R001 Bradycardia, unspecified: Secondary | ICD-10-CM | POA: Diagnosis not present

## 2016-08-03 DIAGNOSIS — I110 Hypertensive heart disease with heart failure: Secondary | ICD-10-CM | POA: Diagnosis not present

## 2016-08-03 DIAGNOSIS — I480 Paroxysmal atrial fibrillation: Secondary | ICD-10-CM | POA: Diagnosis not present

## 2016-08-03 DIAGNOSIS — Z6827 Body mass index (BMI) 27.0-27.9, adult: Secondary | ICD-10-CM | POA: Diagnosis not present

## 2016-08-03 DIAGNOSIS — I5033 Acute on chronic diastolic (congestive) heart failure: Secondary | ICD-10-CM | POA: Diagnosis not present

## 2016-08-03 DIAGNOSIS — I495 Sick sinus syndrome: Secondary | ICD-10-CM | POA: Diagnosis not present

## 2016-08-03 DIAGNOSIS — Z7901 Long term (current) use of anticoagulants: Secondary | ICD-10-CM | POA: Diagnosis not present

## 2016-08-03 DIAGNOSIS — Z79811 Long term (current) use of aromatase inhibitors: Secondary | ICD-10-CM | POA: Diagnosis not present

## 2016-08-03 DIAGNOSIS — Z9181 History of falling: Secondary | ICD-10-CM | POA: Diagnosis not present

## 2016-08-09 ENCOUNTER — Telehealth: Payer: Self-pay | Admitting: Cardiology

## 2016-08-09 DIAGNOSIS — Z6827 Body mass index (BMI) 27.0-27.9, adult: Secondary | ICD-10-CM | POA: Diagnosis not present

## 2016-08-09 DIAGNOSIS — Z9181 History of falling: Secondary | ICD-10-CM | POA: Diagnosis not present

## 2016-08-09 DIAGNOSIS — I110 Hypertensive heart disease with heart failure: Secondary | ICD-10-CM | POA: Diagnosis not present

## 2016-08-09 DIAGNOSIS — R001 Bradycardia, unspecified: Secondary | ICD-10-CM | POA: Diagnosis not present

## 2016-08-09 DIAGNOSIS — Z79811 Long term (current) use of aromatase inhibitors: Secondary | ICD-10-CM | POA: Diagnosis not present

## 2016-08-09 DIAGNOSIS — Z7901 Long term (current) use of anticoagulants: Secondary | ICD-10-CM | POA: Diagnosis not present

## 2016-08-09 DIAGNOSIS — I480 Paroxysmal atrial fibrillation: Secondary | ICD-10-CM | POA: Diagnosis not present

## 2016-08-09 DIAGNOSIS — I5033 Acute on chronic diastolic (congestive) heart failure: Secondary | ICD-10-CM | POA: Diagnosis not present

## 2016-08-09 DIAGNOSIS — I495 Sick sinus syndrome: Secondary | ICD-10-CM | POA: Diagnosis not present

## 2016-08-09 NOTE — Telephone Encounter (Signed)
Received records from [facility] for appointment on 08/17/16 with Dr Ellyn Hack.  Records put with Dr Allison Quarry schedule for 08/17/16. lp

## 2016-08-16 DIAGNOSIS — Z6827 Body mass index (BMI) 27.0-27.9, adult: Secondary | ICD-10-CM | POA: Diagnosis not present

## 2016-08-16 DIAGNOSIS — I110 Hypertensive heart disease with heart failure: Secondary | ICD-10-CM | POA: Diagnosis not present

## 2016-08-16 DIAGNOSIS — Z79811 Long term (current) use of aromatase inhibitors: Secondary | ICD-10-CM | POA: Diagnosis not present

## 2016-08-16 DIAGNOSIS — Z7901 Long term (current) use of anticoagulants: Secondary | ICD-10-CM | POA: Diagnosis not present

## 2016-08-16 DIAGNOSIS — R001 Bradycardia, unspecified: Secondary | ICD-10-CM | POA: Diagnosis not present

## 2016-08-16 DIAGNOSIS — I5033 Acute on chronic diastolic (congestive) heart failure: Secondary | ICD-10-CM | POA: Diagnosis not present

## 2016-08-16 DIAGNOSIS — Z9181 History of falling: Secondary | ICD-10-CM | POA: Diagnosis not present

## 2016-08-16 DIAGNOSIS — I495 Sick sinus syndrome: Secondary | ICD-10-CM | POA: Diagnosis not present

## 2016-08-16 DIAGNOSIS — I480 Paroxysmal atrial fibrillation: Secondary | ICD-10-CM | POA: Diagnosis not present

## 2016-08-17 ENCOUNTER — Ambulatory Visit (INDEPENDENT_AMBULATORY_CARE_PROVIDER_SITE_OTHER): Payer: Medicare HMO | Admitting: Cardiology

## 2016-08-17 VITALS — BP 144/72 | HR 88 | Ht 60.0 in | Wt 138.0 lb

## 2016-08-17 DIAGNOSIS — I10 Essential (primary) hypertension: Secondary | ICD-10-CM | POA: Diagnosis not present

## 2016-08-17 DIAGNOSIS — I5032 Chronic diastolic (congestive) heart failure: Secondary | ICD-10-CM | POA: Diagnosis not present

## 2016-08-17 DIAGNOSIS — I480 Paroxysmal atrial fibrillation: Secondary | ICD-10-CM

## 2016-08-17 MED ORDER — POTASSIUM CHLORIDE CRYS ER 20 MEQ PO TBCR: 20.0000 meq | EXTENDED_RELEASE_TABLET | Freq: Two times a day (BID) | ORAL | 6 refills | Status: DC

## 2016-08-17 MED ORDER — FUROSEMIDE 40 MG PO TABS: 40.0000 mg | ORAL_TABLET | Freq: Two times a day (BID) | ORAL | 6 refills | Status: DC

## 2016-08-17 NOTE — Patient Instructions (Addendum)
PURCHASE COMPRESSION KNEE HI  15-20 MMHG  ONCE YOU PURCHASE COMPRESSION STOCKING AND PUT THEM ON FOR THE FIRST TIME  TAKE YOU FUROSEMIDE ( LASIX) 2 TABLET IN THE MORNING AND 1 TABLET IN EVENING FOR 2 DAYS   THEN RETURN TO TWICE A DAY  IF WEIGHT GAINS MORE THAN 3 LBS OVER NIGHT TAKE AN EXTRA TABLET OF FUROSEMIDE AND POTASSIUM UNTIL WEIGHT GOES BACK DOWN    NO OTHER CHANGES   Your physician wants you to follow-up in 12 MONTHS WITH DR HARDING. You will receive a reminder letter in the mail two months in advance. If you don't receive a letter, please call our office to schedule the follow-up appointment.    If you need a refill on your cardiac medications before your next appointment, please call your pharmacy.

## 2016-08-17 NOTE — Progress Notes (Signed)
PCP: Sara Roger, MD  Clinic Note: Chief Complaint  Patient presents with  . Follow-up    pt stated she hasn't been feeling well, pt denied chest pain and SOB, pt c/o swelling in legs and feet  . Atrial Fibrillation    HPI: Sara [name] is a 83 y.o. female with a PMH below who presents today for four-month follow-up for her history of atrial fibrillation and possible syncope. She is a former patient Dr. Aldona [name] with a history of A. fib that it can control and amiodarone. 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 Since stopping amiodarone, she has had recurrent bouts of A. fib that have been difficult control.  Finally got her into the A. fib clinic where she is followed by Sara [name] and Dr. Curt [name].  Recent Hospitalizations:   03/29/2016: Admitted with recurrent A. fib RVR and atypical syncope (was actually referred directly from PCPs office to come to the emergency room for A. fib RVR) - cardioverted to sinus rhythm/bradycardia with Cardizem dose titrated to 120 mg along with metoprolol 25 twice a day. Was on ELIQUIS for anticoagulation. HCTZ and Diovan were held due to hypotension  05/16/2016: Admitted for peripheral edema despite having increased dose of Lasix. Is noted to be have hypokalemia was then treated with IV Lasix and low-salt diet. Sleep study was recommended. Was back in A. fib RVR. Rates improved with diuresis. She refuses Brewing technologist. He also refused titration of her rate control agents because of side effect symptoms.  Studies Reviewed: Otherwise no new studies  03/30/2016 2-D Echo: EF 55-60% Otherwise essentially normal with vigorous LV function and noted intracavitary gradient. Mildly increased PA pressures.  03/31/2016 TEE/DC CV: EF 55-60%. No vegetation. No R or LA thrombus. -- Successfully cardioverted to sinus rhythm/sinus bradycardia.  Sara [name] was last seen on  06/29/2016 by Dr. Curt [name]. At that time she was feeling better. Tolerating current medications. Those more improved. However she gained 14 ounces discharge. Plan was rate control of her rhythm control. Continue ELIQUIS frantic regulation. Lasix was increased to 40 mg twice a day.  - He plans to see her in follow-up in 6 months.  Interval History: Overall, Sara [name] says she is breathing better. She still says that her heart rate will go up and down and she can occasionally feel skipping, but for the most part it is relatively stable. She denies any chest tightness pressure  with rest or exertion, and no more dyspnea than usual. Overall, she just feels somewhat lethargic and tired. She denies any real PND orthopnea or significant edema since increasing her Lasix. She does still have end of the day swelling however. No lightheadedness, dizziness, weakness or syncope/near syncope. No TIA/amaurosis fugax symptoms. No melena, hematochezia, hematuria, or epstaxis. No claudication.  ROS: A comprehensive was performed. Review of Systems  Constitutional: Negative for malaise/fatigue.  Respiratory: Negative for cough, shortness of breath and wheezing.   Gastrointestinal: Negative for blood in stool and melena.  Genitourinary: Negative for hematuria.  Skin: Negative.   Neurological: Positive for dizziness (Sometimes positional).       She still has the hand and feet numbness.  Endo/Heme/Allergies: Bruises/bleeds easily.  Psychiatric/Behavioral: Negative for depression.  All other systems reviewed and are negative.   Past Medical History:  Diagnosis Date  . Ankle edema    Mild, which is intermittent, usually mildly dependent, left slightly greater than the right.  Marland Kitchen  Anxiety   . Arthritis    In her knees  . Balance problem    Due to weakened knee  . CHF (congestive heart failure) ([place])   . Corneal edema    Severe corneal edema in right eye with multiple folds. Developed after cataract surgery  05/03/09  . GERD (gastroesophageal reflux disease)   . H/O echocardiogram    Echo 03/01/11 EF = >55%. Shows normal systolic function with mild to moderate LV hypertrophy. Left atrial dimension 3.8 cm & a pulmonary artery pressure of 38. There was breakthrough diastolic dysfunction.  . Hematuria    Resolved after coumadin was stopped  . Hiatal hernia    With GERD  . Hyperglycemia    Random, without any history of diabetes  . Hyperlipidemia   . Hypertension    Difficult to control. Renal Doppler 06/02/10 showed less that 60% bilateral renal artery narrowing.  . Hypokalemia   . Paroxysmal atrial fibrillation (HCC)    On amiodarone but not on anticoagulant  . Posthemorrhagic anemia   . Pseudophakia   . Syncope 02/22/11   During OV on 02/22/11 her INR was 7.3 and was given 2.5 mg of vitamin K. Later that day she became diaphoretic & fainted.  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 BREAST BIOPSY    . CARDIOVERSION N/A 03/31/2016   Procedure: CARDIOVERSION;  Surgeon: Jerline Pain, MD;  Location: [facility];  Service: Cardiovascular;  Laterality: N/A;  . CATARACT EXTRACTION  05/03/09  . CHOLECYSTECTOMY    . HEMORRHOID SURGERY    . Remote hysterectomy    . TEE WITHOUT CARDIOVERSION N/A 03/31/2016   Procedure: TRANSESOPHAGEAL ECHOCARDIOGRAM (TEE);  Surgeon: Jerline Pain, MD;  Location: [facility] ENDOSCOPY;  Service: Cardiovascular;  Laterality: N/A;  . TONSILLECTOMY    . TOTAL KNEE ARTHROPLASTY Right 08/26/10   By Dr. Elta Guadeloupe C. Yates. Cemented, computer assist    Current Meds  Medication Sig  . anastrozole (ARIMIDEX) 1 MG tablet Take 1 mg by mouth every morning.   Marland Kitchen aspirin EC 81 MG tablet Take 81 mg by mouth daily.  . Cholecalciferol (VITAMIN D3) 1000 units CAPS Take 1 tablet by mouth daily.  Marland Kitchen diltiazem (CARDIZEM CD) 120 MG 24 hr capsule Take 1 capsule (120 mg total) by mouth daily.  . furosemide (LASIX) 40 MG tablet Take 1 tablet  (40 mg total) by mouth 2 (two) times daily. May take additional tablet if weight gains is more than 3 pounds  . metoprolol (LOPRESSOR) 50 MG tablet Take 50 mg by mouth 2 (two) times daily with a meal.  . potassium chloride SA (K-DUR,KLOR-CON) 20 MEQ tablet Take 1 tablet (20 mEq total) by mouth 2 (two) times daily. Take extra dose if use extra tablet of furosemide  . rOPINIRole (REQUIP) 1 MG tablet Take 1 tablet by mouth at bedtime.   . [DISCONTINUED] furosemide (LASIX) 40 MG tablet Take 1 tablet (40 mg total) by mouth 2 (two) times daily.  . [DISCONTINUED] potassium chloride SA (K-DUR,KLOR-CON) 20 MEQ tablet Take 1 tablet (20 mEq total) by mouth 2 (two) times daily.    Allergies  Allergen Reactions  . Amiodarone Shortness Of Breath    Stopped in Feb 2017 secondary to pulmonary complications  . Benicar [Olmesartan]     Unclear of the intolerance  . Bystolic [Nebivolol Hcl] Other (See Comments)    unknown  . Clonidine Derivatives Other (See Comments)  unknown  . Exforge [Amlodipine Besylate-Valsartan] Other (See Comments)    Unclear  . Hctz [Hydrochlorothiazide]     Currently taking without problem; question if this has to do with hypokalemia  . Hydralazine Other (See Comments)    Also tolerated, but had orthostatic changes on standing medication  . Lisinopril Other (See Comments)    unknown  . Multaq [Dronedarone] Nausea And Vomiting  . Rythmol [Propafenone] Other (See Comments)    unknown  . Statins Other (See Comments)    unknown  . Tekturna [Aliskiren] Other (See Comments)    unknown  . Coreg [Carvedilol] Other (See Comments)    fatigue  . Prednisone     Jittery     Social History   Social History  . Marital status: Widowed    Spouse name: N/A  . Number of children: 1  . Years of education: N/A   Occupational History  .  Retired    Social History Main Topics  . Smoking status: Never Smoker  . Smokeless tobacco: Never Used  . Alcohol use No  . Drug use: No  .  Sexual activity: Not Asked   Other Topics Concern  . None   Social History Narrative   She is a widowed mother of one and grandmother of one.  She does not exercise.  She does not smoke and does not drink alcohol.    family history includes Cancer in her mother; Coronary artery disease in her brother; Diabetes type II in her brother; Heart attack in her sister; Hypertension in her brother, mother, and sister.  Wt Readings from Last 3 Encounters:  08/17/16 62.6 kg (138 lb)  06/29/16 67 kg (147 lb 9.6 oz)  05/21/16 61.5 kg (135 lb 8 oz)    PHYSICAL EXAM BP (!) 144/72   Pulse 88   Ht 5' (1.524 m)   Wt 62.6 kg (138 lb)   BMI 26.95 kg/m  General appearance: alert, cooperative, appears stated age, no distress and Well-nourished, well-groomed.  HEENT: [place]/AT, EOMI, MMM, anicteric sclera Neck: no adenopathy, no carotid bruit and no JVD Lungs: Diffuse mild interstitial sounds with late expiratory wheezing. No rales. Normal percussion bilaterally and non-labored Heart: Regular rate with irregularly irregular rhythm, S1&&2 normal, no murmur, click, rub or gallop; nondisplaced PMI  Abdomen: soft, non-tender; bowel sounds normal; no masses,  no organomegaly;  Extremities: extremities normal, atraumatic, no cyanosis, and trace edema with mild venous stasis changes and spider veins.  Pulses: 2+ and symmetric;  Skin: no evidence of bleeding or bruising, no lesions noted and Mild stasis changes with bilateral spider veins  Neurologic: Mental status: Alert, oriented, thought content appropriate    Adult ECG Report  Rate: 88 ;  Rhythm: atrial fibrillation and RBBB with inferior T-wave inversions.;   Narrative Interpretation: Stable EKG   Other studies Reviewed: Additional studies/ records that were reviewed today include:  Recent Labs:  No results found for: CHOL, HDL, LDLCALC, LDLDIRECT, TRIG, CHOLHDL Lab Results  Component Value Date   CREATININE 0.79 05/21/2016   BUN 15 05/21/2016    NA 141 05/21/2016   K 4.0 05/21/2016   CL 106 05/21/2016   CO2 26 05/21/2016     ASSESSMENT / PLAN: Problem List Items Addressed This Visit    Chronic diastolic CHF (congestive heart failure), NYHA class 2 (HCC) - Primary (Chronic)    Pretty well-controlled overall now that her Lasix dose was increased. We talked about sliding scale Lasix. I also think that she would benefit  from wearing support stockings.  She is not on true afterload reducing agents as she is on to rate control agents.      Relevant Medications   aspirin EC 81 MG tablet   furosemide (LASIX) 40 MG tablet   Other Relevant Orders   EKG 12-Lead (Completed)   Compression stockings   Essential hypertension (Chronic)    Borderline blood pressure today. I would be reluctant to further titrate medications unless we do more beta blocker because of concern for potential postural dizziness and orthostatic symptoms.      Relevant Medications   aspirin EC 81 MG tablet   furosemide (LASIX) 40 MG tablet   Other Relevant Orders   EKG 12-Lead (Completed)   Paroxysmal atrial fibrillation (HCC) (Chronic)    Recurrent persistent A. fib. Now still in A. fib. Plan for now is rate control as she seems to go in and out of it. She is on metoprolol and diltiazem with relatively well-controlled rate. However she does have some tachycardia spells. We may need to, with a when necessary dosing of additional one half dose metoprolol for rapid heartbeat.  Not currently on anticoagulation because of history of bleeding and falls.      Relevant Medications   aspirin EC 81 MG tablet   furosemide (LASIX) 40 MG tablet   Other Relevant Orders   EKG 12-Lead (Completed)      Current medicines are reviewed at length with the patient today. (+/- concerns) None The following changes have been made: --  Patient Instructions    PURCHASE COMPRESSION KNEE HI  15-20 MMHG  ONCE YOU PURCHASE COMPRESSION STOCKING AND PUT THEM ON FOR THE FIRST  TIME  TAKE YOU FUROSEMIDE ( LASIX) 2 TABLET IN THE MORNING AND 1 TABLET IN EVENING FOR 2 DAYS   THEN RETURN TO TWICE A DAY  IF WEIGHT GAINS MORE THAN 3 LBS OVER NIGHT TAKE AN EXTRA TABLET OF FUROSEMIDE AND POTASSIUM UNTIL WEIGHT GOES BACK DOWN    NO OTHER CHANGES   Your physician wants you to follow-up in 12 MONTHS WITH DR Bralen Wiltgen. You will receive a reminder letter in the mail two months in advance. If you don't receive a letter, please call our office to schedule the follow-up appointment.    If you need a refill on your cardiac medications before your next appointment, please call your pharmacy.  - she has a six-month follow-up with Dr. Curt [name]  Studies Ordered:   Orders Placed This Encounter  Procedures  . Compression stockings  . EKG 12-Lead      Glenetta Hew, M.D., M.S. Interventional Cardiologist   Pager # [phone] Phone # [phone] [address] [place], [place] [address]

## 2016-08-20 DIAGNOSIS — I5033 Acute on chronic diastolic (congestive) heart failure: Secondary | ICD-10-CM | POA: Diagnosis not present

## 2016-08-20 DIAGNOSIS — I110 Hypertensive heart disease with heart failure: Secondary | ICD-10-CM | POA: Diagnosis not present

## 2016-08-20 DIAGNOSIS — I480 Paroxysmal atrial fibrillation: Secondary | ICD-10-CM | POA: Diagnosis not present

## 2016-08-20 DIAGNOSIS — I495 Sick sinus syndrome: Secondary | ICD-10-CM | POA: Diagnosis not present

## 2016-08-21 ENCOUNTER — Encounter: Payer: Self-pay | Admitting: Cardiology

## 2016-08-21 NOTE — Assessment & Plan Note (Addendum)
Pretty well-controlled overall now that her Lasix dose was increased. We talked about sliding scale Lasix. I also think that she would benefit from wearing support stockings.  She is not on true afterload reducing agents as she is on to rate control agents.

## 2016-08-21 NOTE — Assessment & Plan Note (Signed)
Borderline blood pressure today. I would be reluctant to further titrate medications unless we do more beta blocker because of concern for potential postural dizziness and orthostatic symptoms.

## 2016-08-21 NOTE — Assessment & Plan Note (Signed)
Recurrent persistent A. fib. Now still in A. fib. Plan for now is rate control as she seems to go in and out of it. She is on metoprolol and diltiazem with relatively well-controlled rate. However she does have some tachycardia spells. We may need to, with a when necessary dosing of additional one half dose metoprolol for rapid heartbeat.  Not currently on anticoagulation because of history of bleeding and falls.

## 2016-08-25 DIAGNOSIS — Z79811 Long term (current) use of aromatase inhibitors: Secondary | ICD-10-CM | POA: Diagnosis not present

## 2016-08-25 DIAGNOSIS — I110 Hypertensive heart disease with heart failure: Secondary | ICD-10-CM | POA: Diagnosis not present

## 2016-08-25 DIAGNOSIS — Z7901 Long term (current) use of anticoagulants: Secondary | ICD-10-CM | POA: Diagnosis not present

## 2016-08-25 DIAGNOSIS — I480 Paroxysmal atrial fibrillation: Secondary | ICD-10-CM | POA: Diagnosis not present

## 2016-08-25 DIAGNOSIS — I495 Sick sinus syndrome: Secondary | ICD-10-CM | POA: Diagnosis not present

## 2016-08-25 DIAGNOSIS — Z9181 History of falling: Secondary | ICD-10-CM | POA: Diagnosis not present

## 2016-08-25 DIAGNOSIS — I5033 Acute on chronic diastolic (congestive) heart failure: Secondary | ICD-10-CM | POA: Diagnosis not present

## 2016-08-25 DIAGNOSIS — Z6827 Body mass index (BMI) 27.0-27.9, adult: Secondary | ICD-10-CM | POA: Diagnosis not present

## 2016-08-25 DIAGNOSIS — R001 Bradycardia, unspecified: Secondary | ICD-10-CM | POA: Diagnosis not present

## 2016-08-30 DIAGNOSIS — I5032 Chronic diastolic (congestive) heart failure: Secondary | ICD-10-CM | POA: Diagnosis not present

## 2016-08-30 DIAGNOSIS — I4891 Unspecified atrial fibrillation: Secondary | ICD-10-CM | POA: Diagnosis not present

## 2016-08-31 DIAGNOSIS — R001 Bradycardia, unspecified: Secondary | ICD-10-CM | POA: Diagnosis not present

## 2016-08-31 DIAGNOSIS — Z9181 History of falling: Secondary | ICD-10-CM | POA: Diagnosis not present

## 2016-08-31 DIAGNOSIS — I495 Sick sinus syndrome: Secondary | ICD-10-CM | POA: Diagnosis not present

## 2016-08-31 DIAGNOSIS — Z7901 Long term (current) use of anticoagulants: Secondary | ICD-10-CM | POA: Diagnosis not present

## 2016-08-31 DIAGNOSIS — Z79811 Long term (current) use of aromatase inhibitors: Secondary | ICD-10-CM | POA: Diagnosis not present

## 2016-08-31 DIAGNOSIS — I5033 Acute on chronic diastolic (congestive) heart failure: Secondary | ICD-10-CM | POA: Diagnosis not present

## 2016-08-31 DIAGNOSIS — Z6827 Body mass index (BMI) 27.0-27.9, adult: Secondary | ICD-10-CM | POA: Diagnosis not present

## 2016-08-31 DIAGNOSIS — I480 Paroxysmal atrial fibrillation: Secondary | ICD-10-CM | POA: Diagnosis not present

## 2016-08-31 DIAGNOSIS — I110 Hypertensive heart disease with heart failure: Secondary | ICD-10-CM | POA: Diagnosis not present

## 2016-09-08 DIAGNOSIS — J209 Acute bronchitis, unspecified: Secondary | ICD-10-CM | POA: Diagnosis not present

## 2016-09-14 DIAGNOSIS — I495 Sick sinus syndrome: Secondary | ICD-10-CM | POA: Diagnosis not present

## 2016-09-14 DIAGNOSIS — Z79811 Long term (current) use of aromatase inhibitors: Secondary | ICD-10-CM | POA: Diagnosis not present

## 2016-09-14 DIAGNOSIS — I110 Hypertensive heart disease with heart failure: Secondary | ICD-10-CM | POA: Diagnosis not present

## 2016-09-14 DIAGNOSIS — I5033 Acute on chronic diastolic (congestive) heart failure: Secondary | ICD-10-CM | POA: Diagnosis not present

## 2016-09-14 DIAGNOSIS — R001 Bradycardia, unspecified: Secondary | ICD-10-CM | POA: Diagnosis not present

## 2016-09-14 DIAGNOSIS — Z9181 History of falling: Secondary | ICD-10-CM | POA: Diagnosis not present

## 2016-09-14 DIAGNOSIS — Z6827 Body mass index (BMI) 27.0-27.9, adult: Secondary | ICD-10-CM | POA: Diagnosis not present

## 2016-09-14 DIAGNOSIS — I480 Paroxysmal atrial fibrillation: Secondary | ICD-10-CM | POA: Diagnosis not present

## 2016-09-14 DIAGNOSIS — Z7901 Long term (current) use of anticoagulants: Secondary | ICD-10-CM | POA: Diagnosis not present

## 2016-09-28 DIAGNOSIS — R001 Bradycardia, unspecified: Secondary | ICD-10-CM | POA: Diagnosis not present

## 2016-09-28 DIAGNOSIS — I5033 Acute on chronic diastolic (congestive) heart failure: Secondary | ICD-10-CM | POA: Diagnosis not present

## 2016-09-28 DIAGNOSIS — I480 Paroxysmal atrial fibrillation: Secondary | ICD-10-CM | POA: Diagnosis not present

## 2016-09-28 DIAGNOSIS — I495 Sick sinus syndrome: Secondary | ICD-10-CM | POA: Diagnosis not present

## 2016-09-28 DIAGNOSIS — I110 Hypertensive heart disease with heart failure: Secondary | ICD-10-CM | POA: Diagnosis not present

## 2016-09-28 DIAGNOSIS — Z79811 Long term (current) use of aromatase inhibitors: Secondary | ICD-10-CM | POA: Diagnosis not present

## 2016-09-28 DIAGNOSIS — Z6827 Body mass index (BMI) 27.0-27.9, adult: Secondary | ICD-10-CM | POA: Diagnosis not present

## 2016-09-28 DIAGNOSIS — Z7901 Long term (current) use of anticoagulants: Secondary | ICD-10-CM | POA: Diagnosis not present

## 2016-09-28 DIAGNOSIS — Z9181 History of falling: Secondary | ICD-10-CM | POA: Diagnosis not present

## 2016-10-02 ENCOUNTER — Other Ambulatory Visit: Payer: Self-pay | Admitting: Cardiology

## 2016-10-04 NOTE — Telephone Encounter (Signed)
Rx(s) sent to pharmacy electronically.  

## 2016-10-11 DIAGNOSIS — R252 Cramp and spasm: Secondary | ICD-10-CM | POA: Diagnosis not present

## 2016-10-13 DIAGNOSIS — I110 Hypertensive heart disease with heart failure: Secondary | ICD-10-CM | POA: Diagnosis not present

## 2016-10-13 DIAGNOSIS — R001 Bradycardia, unspecified: Secondary | ICD-10-CM | POA: Diagnosis not present

## 2016-10-13 DIAGNOSIS — Z6827 Body mass index (BMI) 27.0-27.9, adult: Secondary | ICD-10-CM | POA: Diagnosis not present

## 2016-10-13 DIAGNOSIS — I480 Paroxysmal atrial fibrillation: Secondary | ICD-10-CM | POA: Diagnosis not present

## 2016-10-13 DIAGNOSIS — Z79811 Long term (current) use of aromatase inhibitors: Secondary | ICD-10-CM | POA: Diagnosis not present

## 2016-10-13 DIAGNOSIS — Z9181 History of falling: Secondary | ICD-10-CM | POA: Diagnosis not present

## 2016-10-13 DIAGNOSIS — I5033 Acute on chronic diastolic (congestive) heart failure: Secondary | ICD-10-CM | POA: Diagnosis not present

## 2016-10-13 DIAGNOSIS — Z7901 Long term (current) use of anticoagulants: Secondary | ICD-10-CM | POA: Diagnosis not present

## 2016-10-13 DIAGNOSIS — I495 Sick sinus syndrome: Secondary | ICD-10-CM | POA: Diagnosis not present

## 2016-10-16 DIAGNOSIS — I4891 Unspecified atrial fibrillation: Secondary | ICD-10-CM | POA: Diagnosis not present

## 2016-10-16 DIAGNOSIS — Z79899 Other long term (current) drug therapy: Secondary | ICD-10-CM | POA: Diagnosis not present

## 2016-10-16 DIAGNOSIS — K219 Gastro-esophageal reflux disease without esophagitis: Secondary | ICD-10-CM | POA: Diagnosis not present

## 2016-10-16 DIAGNOSIS — K56699 Other intestinal obstruction unspecified as to partial versus complete obstruction: Secondary | ICD-10-CM | POA: Diagnosis not present

## 2016-10-16 DIAGNOSIS — Z7982 Long term (current) use of aspirin: Secondary | ICD-10-CM | POA: Diagnosis not present

## 2016-10-16 DIAGNOSIS — E870 Hyperosmolality and hypernatremia: Secondary | ICD-10-CM | POA: Diagnosis not present

## 2016-10-16 DIAGNOSIS — K56600 Partial intestinal obstruction, unspecified as to cause: Secondary | ICD-10-CM | POA: Diagnosis not present

## 2016-10-16 DIAGNOSIS — I10 Essential (primary) hypertension: Secondary | ICD-10-CM | POA: Diagnosis not present

## 2016-10-16 DIAGNOSIS — K56609 Unspecified intestinal obstruction, unspecified as to partial versus complete obstruction: Secondary | ICD-10-CM | POA: Diagnosis not present

## 2016-10-16 DIAGNOSIS — R112 Nausea with vomiting, unspecified: Secondary | ICD-10-CM | POA: Diagnosis not present

## 2016-10-16 DIAGNOSIS — R404 Transient alteration of awareness: Secondary | ICD-10-CM | POA: Diagnosis not present

## 2016-10-16 DIAGNOSIS — E860 Dehydration: Secondary | ICD-10-CM | POA: Diagnosis not present

## 2016-10-16 DIAGNOSIS — R531 Weakness: Secondary | ICD-10-CM | POA: Diagnosis not present

## 2016-10-16 DIAGNOSIS — R0902 Hypoxemia: Secondary | ICD-10-CM | POA: Diagnosis not present

## 2016-10-16 DIAGNOSIS — I480 Paroxysmal atrial fibrillation: Secondary | ICD-10-CM | POA: Diagnosis not present

## 2016-10-16 DIAGNOSIS — K5669 Other partial intestinal obstruction: Secondary | ICD-10-CM | POA: Diagnosis not present

## 2016-10-18 DIAGNOSIS — I4891 Unspecified atrial fibrillation: Secondary | ICD-10-CM | POA: Diagnosis not present

## 2016-10-18 DIAGNOSIS — R0902 Hypoxemia: Secondary | ICD-10-CM

## 2016-10-18 DIAGNOSIS — E870 Hyperosmolality and hypernatremia: Secondary | ICD-10-CM | POA: Diagnosis not present

## 2016-10-23 DIAGNOSIS — K56600 Partial intestinal obstruction, unspecified as to cause: Secondary | ICD-10-CM | POA: Diagnosis not present

## 2016-10-23 DIAGNOSIS — K219 Gastro-esophageal reflux disease without esophagitis: Secondary | ICD-10-CM | POA: Diagnosis not present

## 2016-11-15 IMAGING — DX DG CHEST 2 VIEW
2 series · 2 of 2 positions shown · non-contrast
Comparison: 08/23/2010

CLINICAL DATA: Atrial fibrillation

EXAM:
CHEST  2 VIEW

[w chest pa]
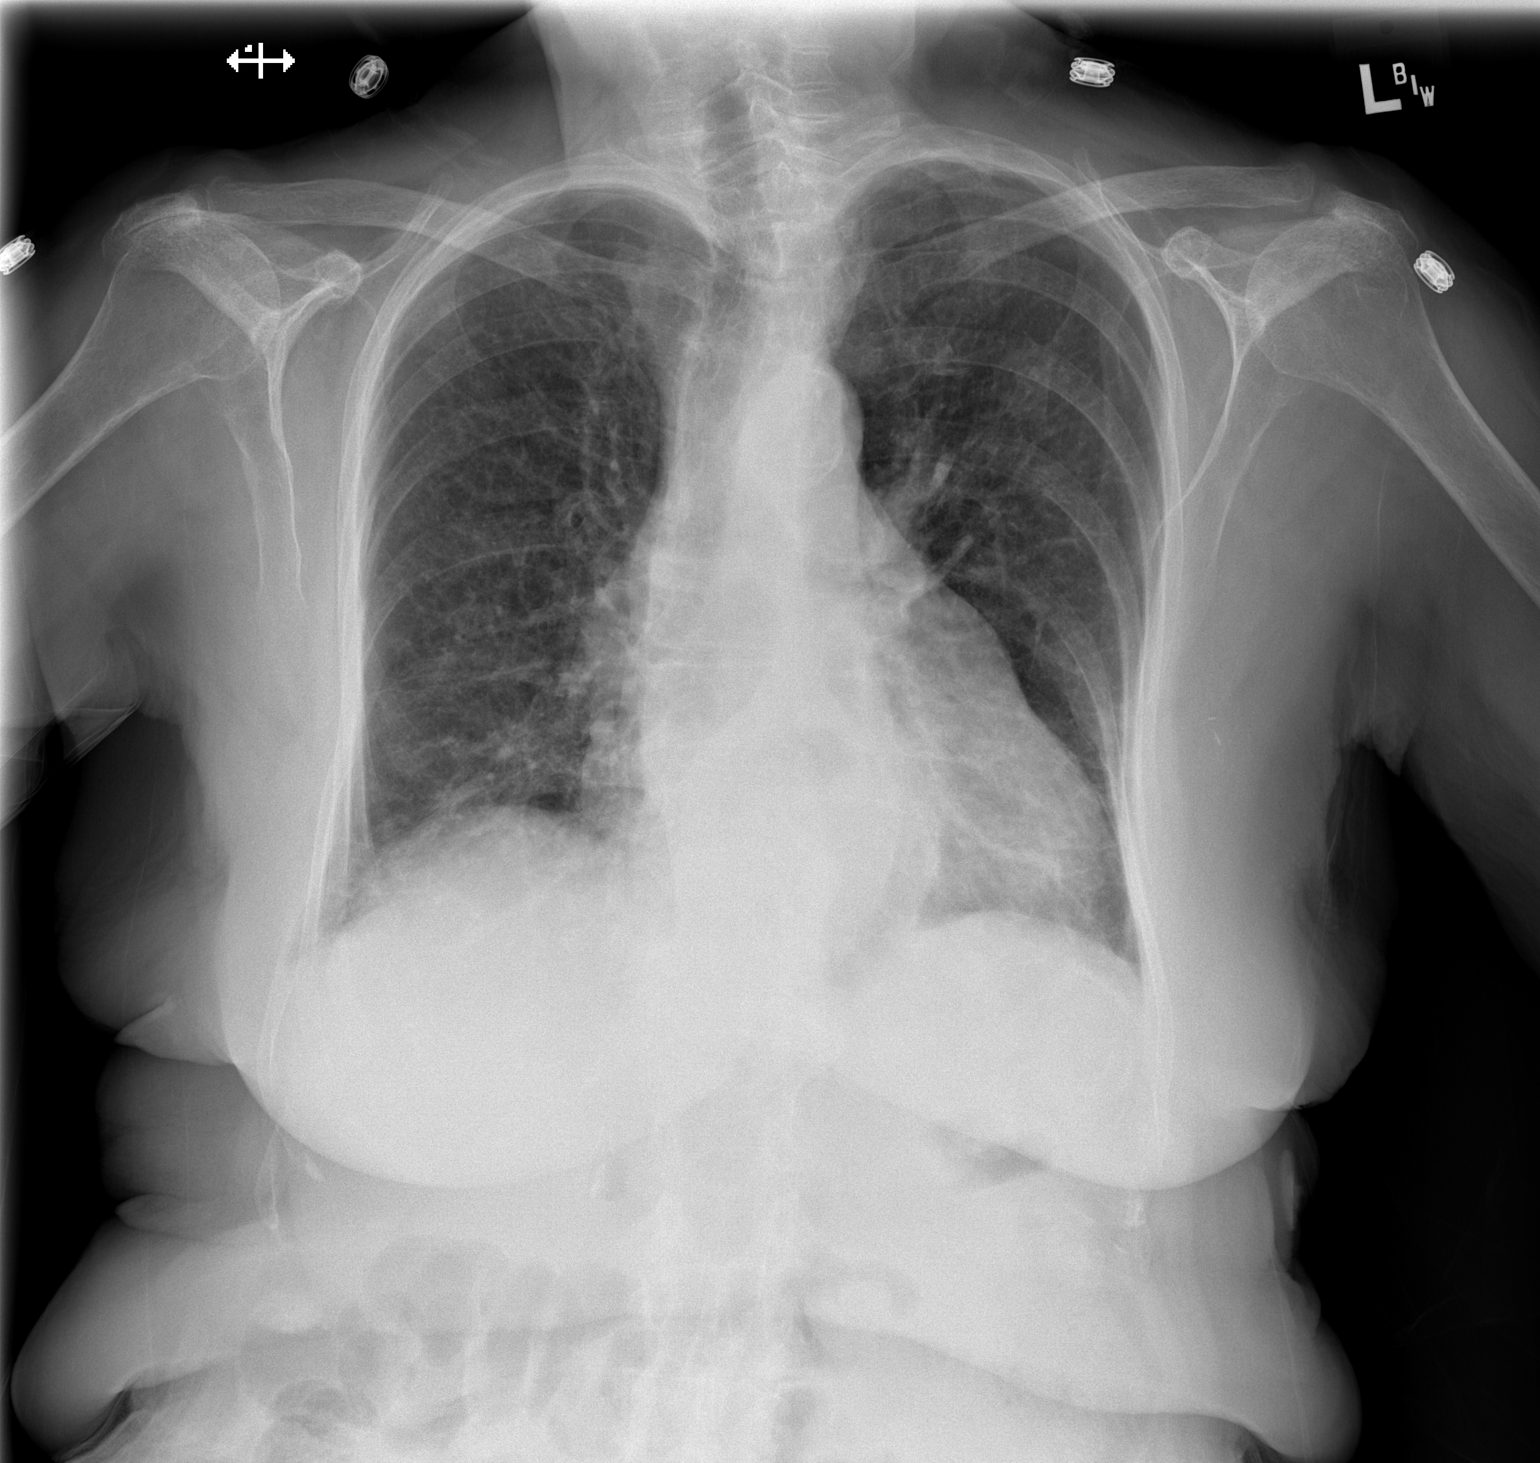

[w chest lat]
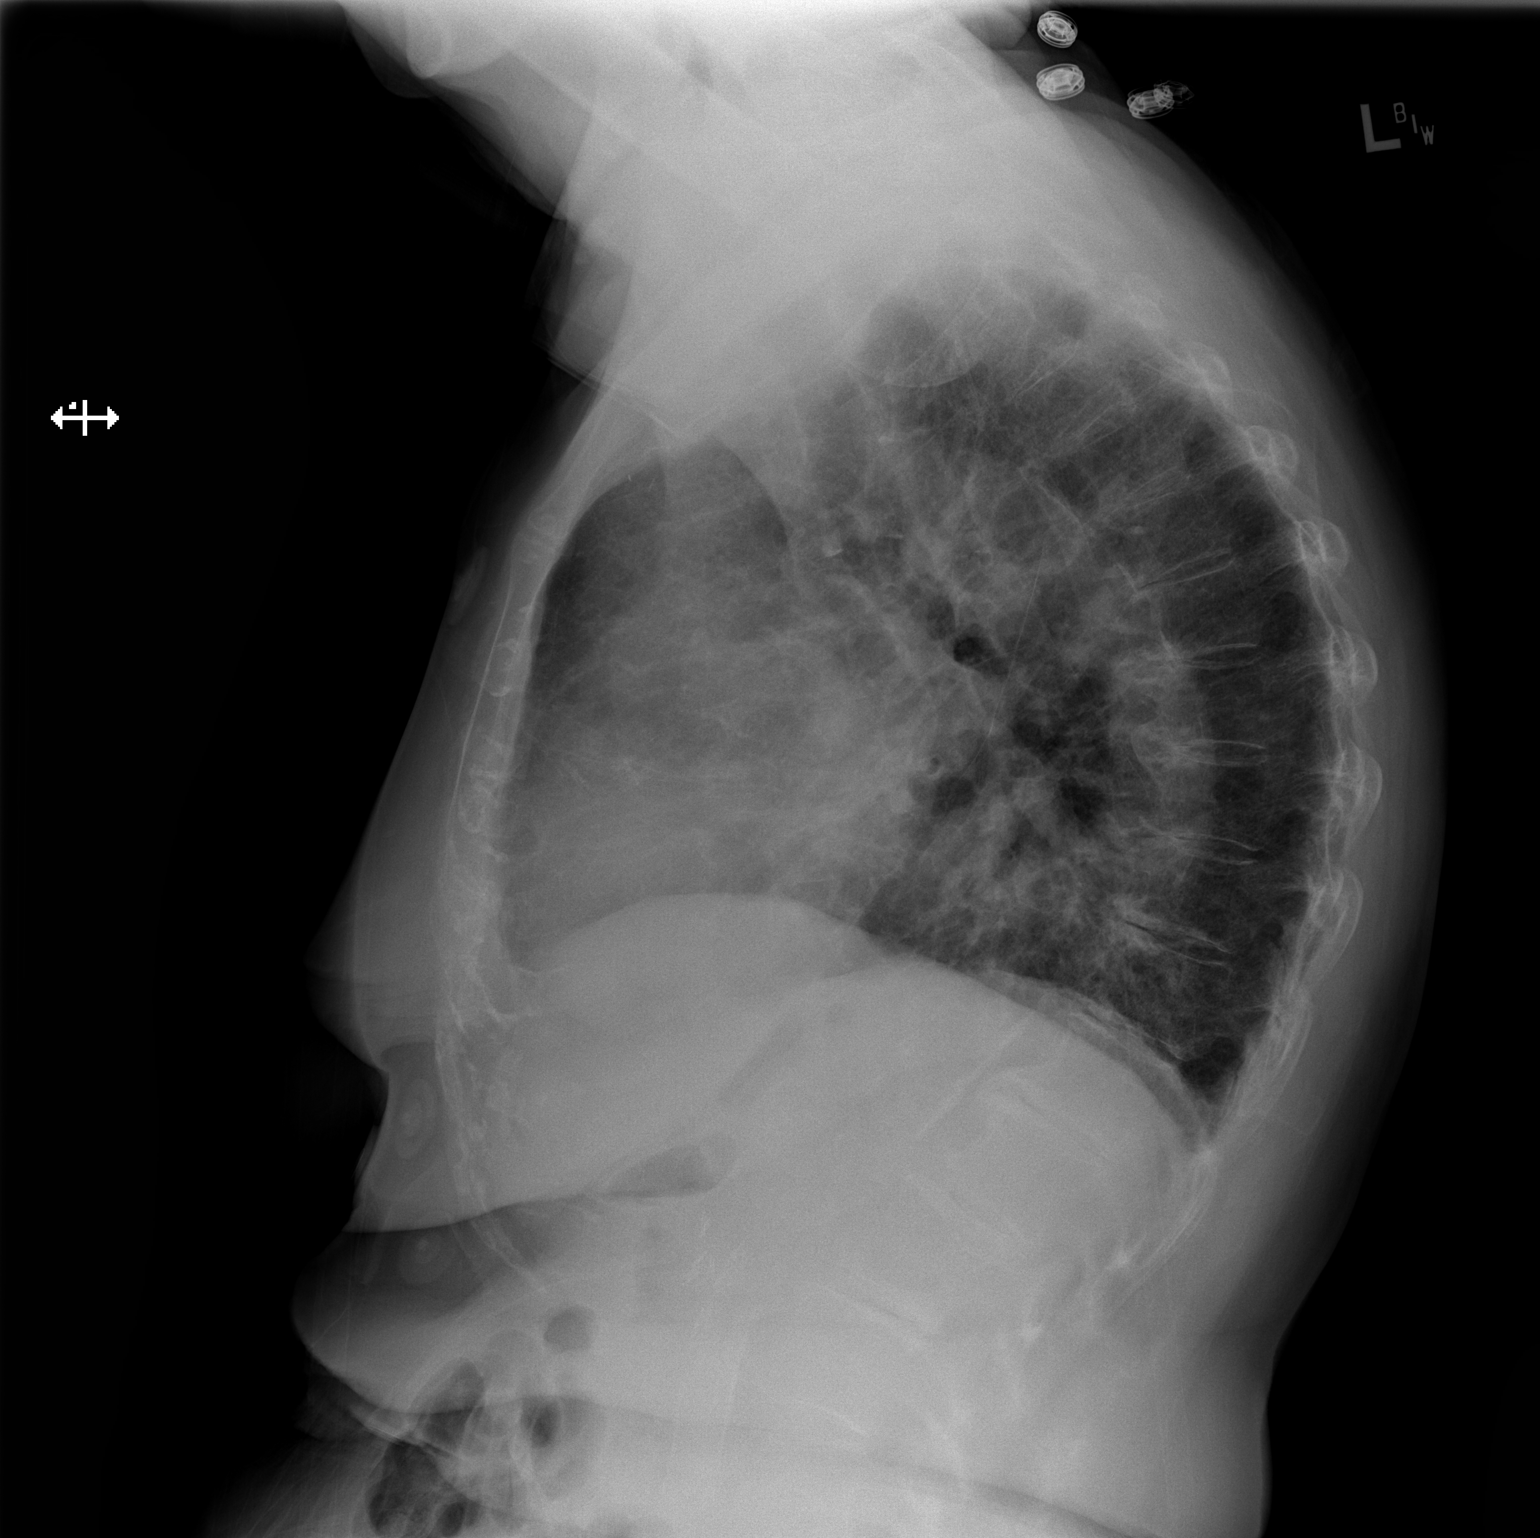

[2 of 2 positions shown; findings below may reference images not displayed]

FINDINGS: Cardiac shadow is mildly enlarged. A hiatal hernia is again seen.
Chronic interstitial changes are noted bilaterally without focal
confluent infiltrate. Degenerative changes of thoracic spine are
noted.
IMPRESSION: Chronic changes without acute abnormality.

## 2016-11-20 DIAGNOSIS — G2581 Restless legs syndrome: Secondary | ICD-10-CM | POA: Diagnosis not present

## 2017-01-01 ENCOUNTER — Encounter (INDEPENDENT_AMBULATORY_CARE_PROVIDER_SITE_OTHER): Payer: Self-pay

## 2017-01-01 ENCOUNTER — Ambulatory Visit (INDEPENDENT_AMBULATORY_CARE_PROVIDER_SITE_OTHER): Payer: Medicare HMO | Admitting: Cardiology

## 2017-01-01 ENCOUNTER — Encounter: Payer: Self-pay | Admitting: Cardiology

## 2017-01-01 VITALS — BP 130/76 | HR 80 | Ht 60.0 in | Wt 146.2 lb

## 2017-01-01 DIAGNOSIS — I482 Chronic atrial fibrillation: Secondary | ICD-10-CM | POA: Diagnosis not present

## 2017-01-01 DIAGNOSIS — I10 Essential (primary) hypertension: Secondary | ICD-10-CM | POA: Diagnosis not present

## 2017-01-01 DIAGNOSIS — I5032 Chronic diastolic (congestive) heart failure: Secondary | ICD-10-CM

## 2017-01-01 DIAGNOSIS — I4821 Permanent atrial fibrillation: Secondary | ICD-10-CM

## 2017-01-01 MED ORDER — CARVEDILOL 12.5 MG PO TABS: 12.5000 mg | ORAL_TABLET | Freq: Two times a day (BID) | ORAL | 1 refills | Status: DC

## 2017-01-01 NOTE — Progress Notes (Signed)
Electrophysiology Office Note   Date:  01/01/2017   ID:  Sara [name], Sara [name] April 05, 1926, MRN [id number]  PCP:  Townsend Roger, MD  Cardiologist:  Ellyn Hack Primary Electrophysiologist:  Mikias Lanz Meredith Leeds, MD    Chief Complaint  Patient presents with  . Follow-up    PAF     History of Present Illness: Sara [name] is a 83 y.o. female who presents today for electrophysiology evaluation.   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 Recently admitted from 03/29/16-04/01/16 for rapid Afib, had successful TEE/DCCV. Admitted on 05/16/16 with dyspnea and BLE edema, in atrial fib. Linq monitor was Suggested, but the patient refused. TEE and cardioversion with reversion to sinus rhythm. Her to her hospitalization, she refused titration of rate controlling medications that these are the causes of her symptoms. Her LA is severely dilated. Her QTc was 531 with a RBBB.   Today, denies symptoms of palpitations, chest pain, shortness of breath, orthopnea, PND, claudication, dizziness, presyncope, syncope, bleeding, or neurologic sequela. The patient is tolerating medications without difficulties. Her main complaint today is of lower extremity edema. She says is been going on for a few months. She feels that it is due to metoprolol. She was in the hospital in her edema went away as a recording metoprolol, but on restarting edema came back.   Past Medical History:  Diagnosis Date  . Ankle edema    Mild, which is intermittent, usually mildly dependent, left slightly greater than the right.  . Anxiety   . Arthritis    In her knees  . Balance problem    Due to weakened knee  . CHF (congestive heart failure) ([place])   . Corneal edema    Severe corneal edema in right eye with multiple folds. Developed after cataract surgery 05/03/09  . GERD (gastroesophageal reflux disease)   . H/O echocardiogram    Echo 03/01/11 EF  = >55%. Shows normal systolic function with mild to moderate LV hypertrophy. Left atrial dimension 3.8 cm & a pulmonary artery pressure of 38. There was breakthrough diastolic dysfunction.  . Hematuria    Resolved after coumadin was stopped  . Hiatal hernia    With GERD  . Hyperglycemia    Random, without any history of diabetes  . Hyperlipidemia   . Hypertension    Difficult to control. Renal Doppler 06/02/10 showed less that 60% bilateral renal artery narrowing.  . Hypokalemia   . Paroxysmal atrial fibrillation (HCC)    On amiodarone but not on anticoagulant  . Posthemorrhagic anemia   . Pseudophakia   . Syncope 02/22/11   During OV on 02/22/11 her INR was 7.3 and was given 2.5 mg of vitamin K. Later that day she became diaphoretic & fainted.  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 BREAST BIOPSY    . CARDIOVERSION N/A 03/31/2016   Procedure: CARDIOVERSION;  Surgeon: Jerline Pain, MD;  Location: [facility];  Service: Cardiovascular;  Laterality: N/A;  . CATARACT EXTRACTION  05/03/09  . CHOLECYSTECTOMY    . HEMORRHOID SURGERY    . Remote hysterectomy    . TEE WITHOUT CARDIOVERSION N/A 03/31/2016   Procedure: TRANSESOPHAGEAL ECHOCARDIOGRAM (TEE);  Surgeon: Jerline Pain, MD;  [facility] ENDOSCOPY;  Service: Cardiovascular;  Laterality: N/A;  . TONSILLECTOMY    . TOTAL KNEE ARTHROPLASTY Right 08/26/10  By Dr. Thana Farr. Yates. Cemented, computer assist     Current Outpatient Prescriptions  Medication Sig Dispense Refill  . aspirin EC 81 MG tablet Take 81 mg by mouth daily.    Marland Kitchen diltiazem (TIAZAC) 120 MG 24 hr capsule TAKE ONE CAPSULE BY MOUTH DAILY 30 capsule 9  . furosemide (LASIX) 40 MG tablet Take 1 tablet (40 mg total) by mouth 2 (two) times daily. May take additional tablet if weight gains is more than 3 pounds 90 tablet 6  . metoprolol (LOPRESSOR) 50 MG tablet Take 50 mg by mouth 2 (two) times daily with a meal.  2   . potassium chloride SA (K-DUR,KLOR-CON) 20 MEQ tablet Take 1 tablet (20 mEq total) by mouth 2 (two) times daily. Take extra dose if use extra tablet of furosemide 90 tablet 6  . rOPINIRole (REQUIP) 1 MG tablet Take 1 tablet by mouth at bedtime.   3   No current facility-administered medications for this visit.     Allergies:   Amiodarone; Benicar [olmesartan]; Bystolic [nebivolol hcl]; Clonidine derivatives; Exforge [amlodipine besylate-valsartan]; Hctz [hydrochlorothiazide]; Hydralazine; Lisinopril; Multaq [dronedarone]; Rythmol [propafenone]; Statins; Tekturna [aliskiren]; Coreg [carvedilol]; and Prednisone   Social History:  The patient  reports that she has never smoked. She has never used smokeless tobacco. She reports that she does not drink alcohol or use drugs.   Family History:  The patient's family history includes Cancer in her mother; Coronary artery disease in her brother; Diabetes type II in her brother; Heart attack in her sister; Hypertension in her brother, mother, and sister.    ROS:  Please see the history of present illness.   Otherwise, review of systems is positive for weight change, leg swelling, palpitations, hearing loss, facial swelling, visual changes, abdominal pain, constipation, balance problems, dizziness, easy bruising.   All other systems are reviewed and negative.   PHYSICAL EXAM: VS:  BP 130/76   Pulse 80   Ht 5' (1.524 m)   Wt 146 lb 3.2 oz (66.3 kg)   BMI 28.55 kg/m  , BMI Body mass index is 28.55 kg/m. GEN: Well nourished, well developed, in no acute distress  HEENT: normal  Neck: no JVD, carotid bruits, or masses Cardiac: RRR; no murmurs, rubs, or gallops,no edema  Respiratory:  clear to auscultation bilaterally, normal work of breathing GI: soft, nontender, nondistended, + BS MS: no deformity or atrophy  Skin: warm and dry Neuro:  Strength and sensation are intact Psych: euthymic mood, full affect  EKG:  EKG is not ordered today. Personal  review of the ekg ordered 08/17/16 shows AF, RBBB   Recent Labs: 05/16/2016: B Natriuretic Peptide 362.3; Hemoglobin 10.9; Platelets 284 05/21/2016: BUN 15; Creatinine, Ser 0.79; Potassium 4.0; Sodium 141    Lipid Panel  No results found for: CHOL, TRIG, HDL, CHOLHDL, VLDL, LDLCALC, LDLDIRECT   Wt Readings from Last 3 Encounters:  01/01/17 146 lb 3.2 oz (66.3 kg)  08/17/16 138 lb (62.6 kg)  06/29/16 147 lb 9.6 oz (67 kg)      Other studies Reviewed: Additional studies/ records that were reviewed today include: TTE 03/30/16  Review of the above records today demonstrates:  - Left ventricle: The cavity size was normal. Wall thickness was   increased in a pattern of mild LVH. Systolic function was   vigorous. The estimated ejection fraction was in the range of 65%   to 70%. Wall motion was normal; there were no regional wall   motion abnormalities. Doppler parameters are consistent  with high   ventricular filling pressure. - Aortic valve: There was trivial regurgitation. - Mitral valve: Calcified annulus. Mildly thickened leaflets . - Left atrium: The atrium was severely dilated. - Right atrium: The atrium was mildly dilated. - Pulmonary arteries: Systolic pressure was mildly increased. PA   peak pressure: 37 mm Hg (S).   ASSESSMENT AND PLAN:  1.  Persistent atrial fibrillation: Left atrium is severely dilated and thus a rhythm control strategy is unlikely to be successful. Currently in a rate control strategy. She is on both metoprolol and diltiazem, but feels that both of these medications are causing her increased lower extremity edema. We'll stop both those medications and start her on carvedilol 12.5 mg twice a day. She does have an elevated stroke risk, but she is deemed to unstable on her feet with falls and bleeding risk. I have told her discuss this with her son and if they feel that her fall risk is low, she may be able to start Eliquis.  This patients CHA2DS2-VASc Score  and unadjusted Ischemic Stroke Rate (% per year) is equal to 4.8 % stroke rate/year from a score of 4  Above score calculated as 1 point each if present [CHF, HTN, DM, Vascular=MI/PAD/Aortic Plaque, Age if 65-74, or Female] Above score calculated as 2 points each if present [Age > 75, or Stroke/TIA/TE]   2. Diastolic HF: Has significant lower extremity edema. She feels that this is due to metoprolol. Plan to stop both metoprolol and diltiazem and start her on carvedilol today.  3. Hypertension: Well controlled today    Current medicines are reviewed at length with the patient today.   The patient does not have concerns regarding her medicines.  The following changes were made today:  Start coreg, stop diltiazem, metoprolol  Labs/ tests ordered today include:  No orders of the defined types were placed in this encounter.    Disposition:   FU with Brailee Riede 6 months  Signed, [place] Meredith Leeds, MD  01/01/2017 2:35 PM     [address] [place] [address] [phone] (office) [phone] (fax)

## 2017-01-01 NOTE — Addendum Note (Signed)
Addended by: Sandrea Hammond D on: 01/01/2017 03:03 PM   Modules accepted: Orders

## 2017-01-01 NOTE — Patient Instructions (Signed)
Medication Instructions: Stop Metoprolol and diltiazem  Start Coreg 12.5 mg twice daily  Labwork: None ordered  Procedures/Testing: None ordered  Follow-Up: Your physician wants you to follow-up in: 6 months with Dr. Curt Bears. You will receive a reminder letter in the mail two months in advance. If you don't receive a letter, please call our office to schedule the follow-up appointment.   Any Additional Special Instructions Will Be Listed Below (If Applicable).     If you need a refill on your cardiac medications before your next appointment, please call your pharmacy.

## 2017-01-02 IMAGING — DX DG CHEST 2 VIEW
2 series · 2 of 2 positions shown · non-contrast
Comparison: 03/29/2016 and earlier.

CLINICAL DATA: 89-year-old female with sensation of fluid build up
for 2-3 weeks. Suspected CHF. Initial encounter.

EXAM:
CHEST  2 VIEW

[chest pa]
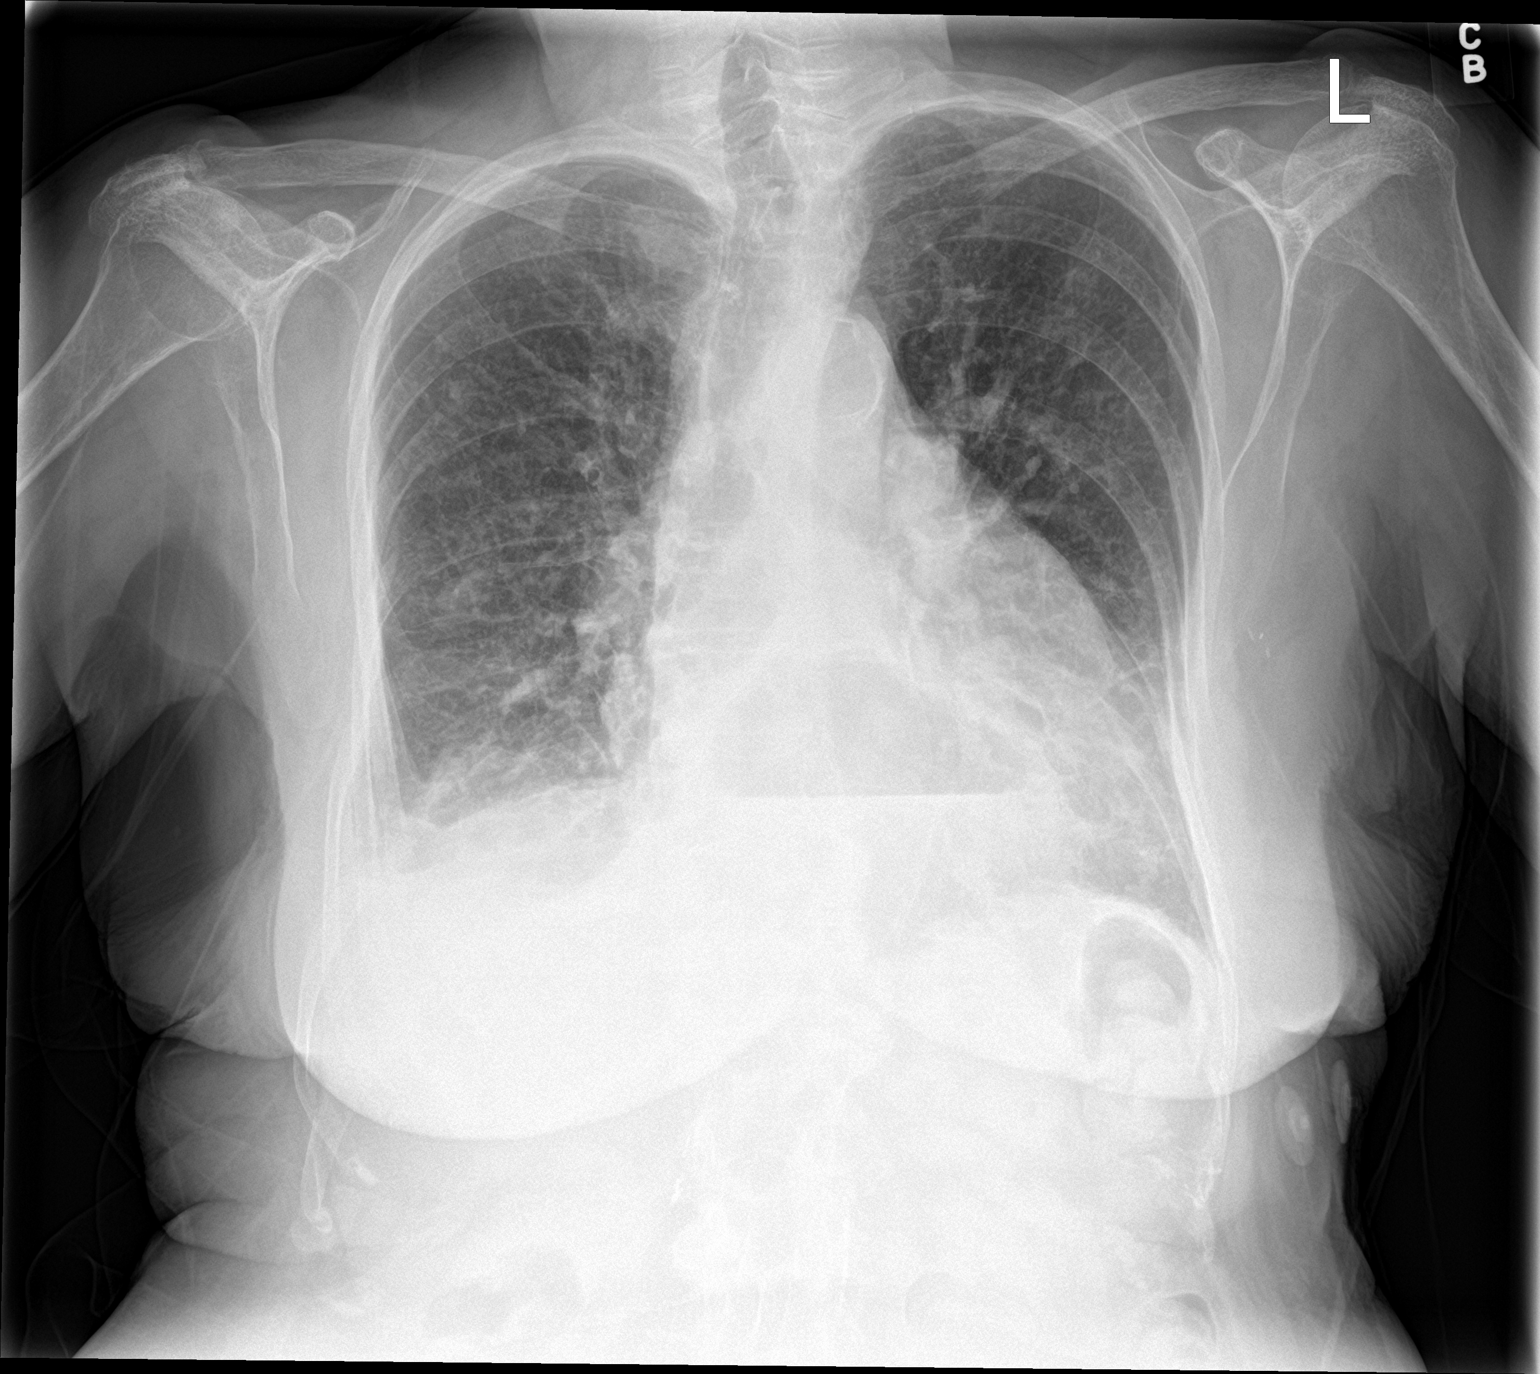

[chest lat]
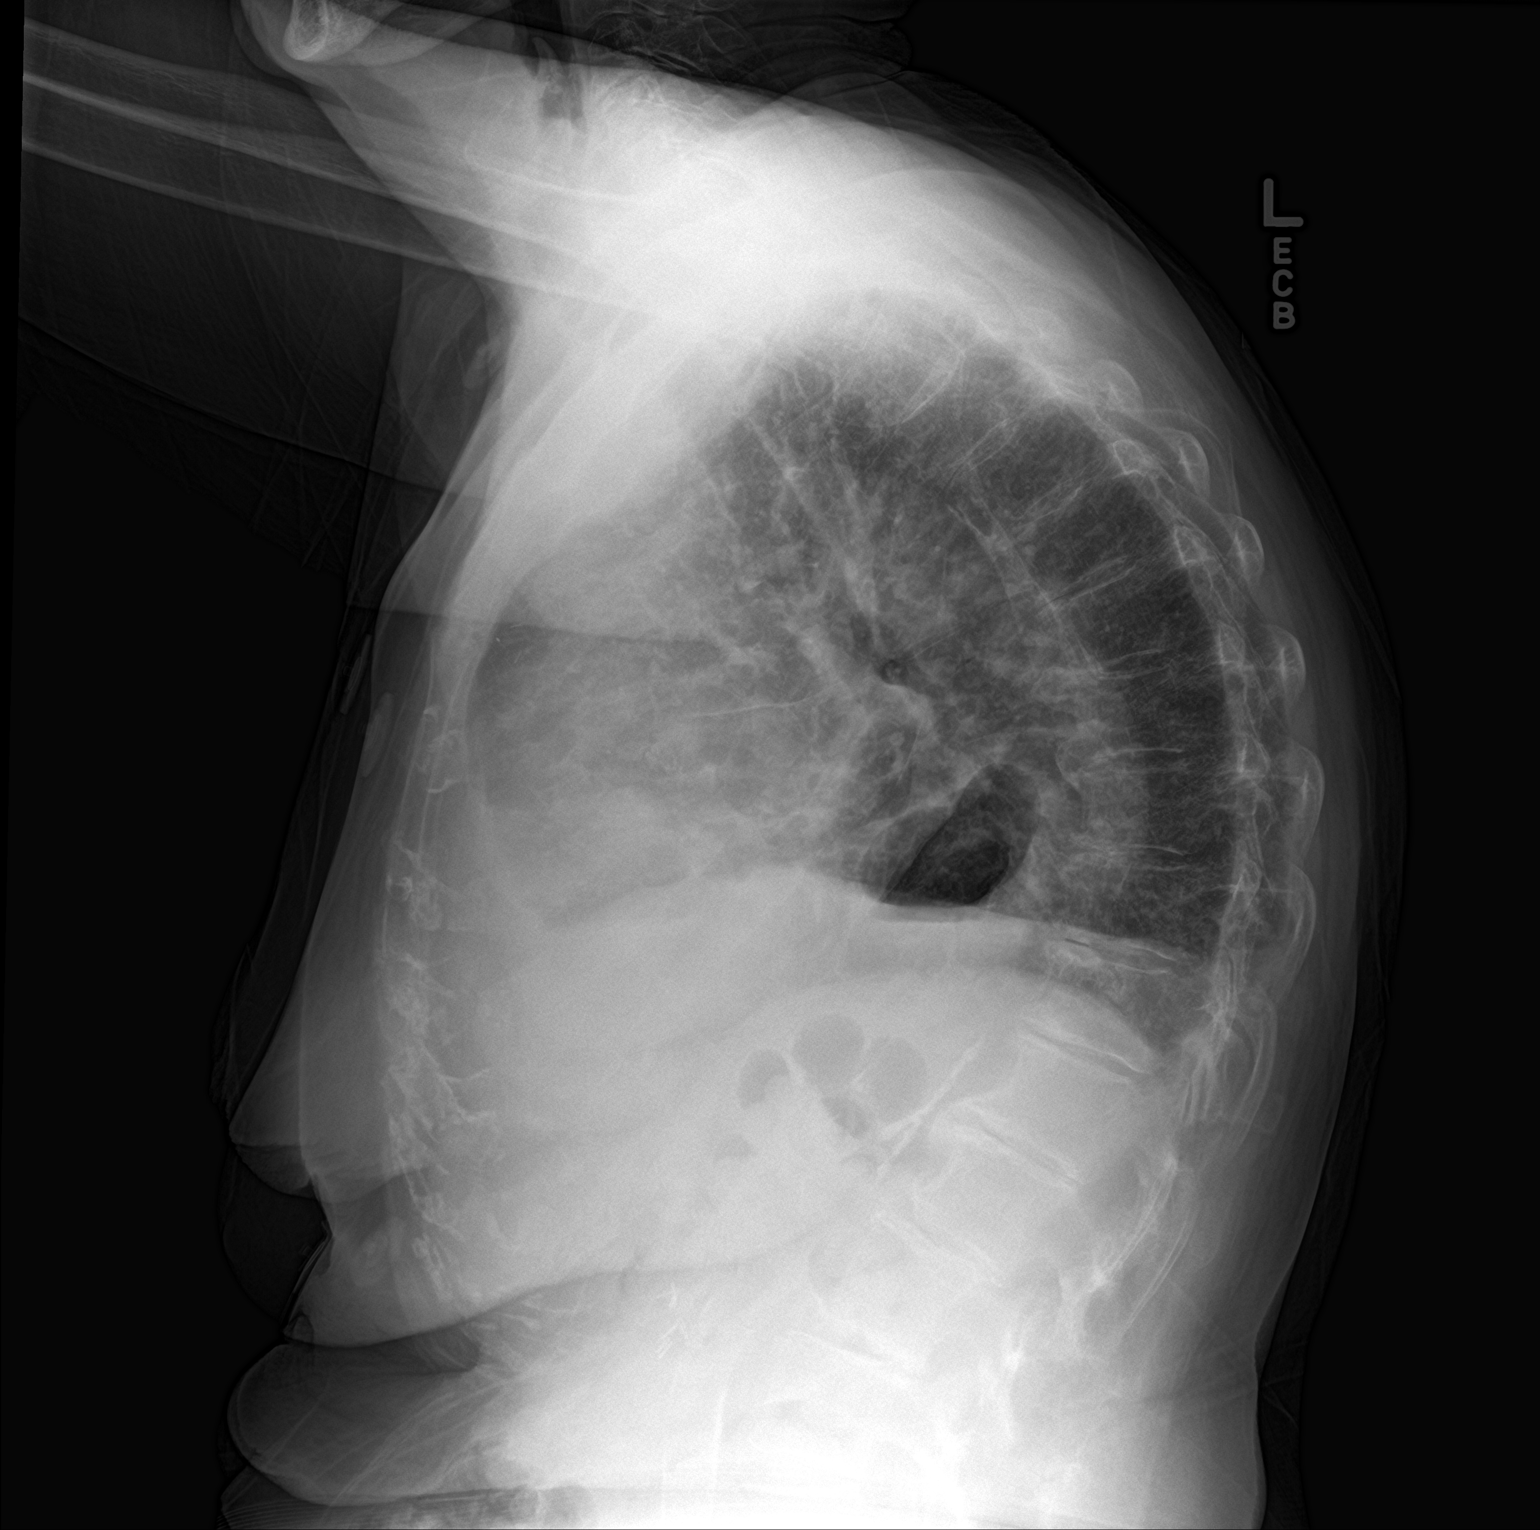

[2 of 2 positions shown; findings below may reference images not displayed]

FINDINGS: Increased conspicuity of moderate to large gastric hiatal hernia
containing an air-fluid level today. Stable mediastinal contours
otherwise, with evidence of cardiomegaly. Mildly increased pulmonary
vascularity. New small right pleural effusion. No pneumothorax. No
consolidation. Calcified aortic atherosclerosis. Visualized tracheal
air column is within normal limits. Osteopenia. Exaggerated thoracic
kyphosis. No acute osseous abnormality identified. Stable
cholecystectomy clips.
IMPRESSION: 1. Mild pulmonary interstitial edema and new small right pleural
effusions.
2. Increased conspicuity of moderate to large gastric hiatal hernia,
containing an air-fluid level today.
3.  Calcified aortic atherosclerosis.

## 2017-01-13 ENCOUNTER — Other Ambulatory Visit: Payer: Self-pay | Admitting: Cardiology

## 2017-01-15 NOTE — Telephone Encounter (Signed)
This is Dr. Harding's pt. °

## 2017-01-22 DIAGNOSIS — I10 Essential (primary) hypertension: Secondary | ICD-10-CM | POA: Diagnosis not present

## 2017-01-22 DIAGNOSIS — G629 Polyneuropathy, unspecified: Secondary | ICD-10-CM | POA: Diagnosis not present

## 2017-01-22 DIAGNOSIS — I480 Paroxysmal atrial fibrillation: Secondary | ICD-10-CM | POA: Diagnosis not present

## 2017-01-22 DIAGNOSIS — I5043 Acute on chronic combined systolic (congestive) and diastolic (congestive) heart failure: Secondary | ICD-10-CM | POA: Diagnosis not present

## 2017-02-14 ENCOUNTER — Ambulatory Visit: Payer: Medicare HMO | Admitting: Cardiology

## 2017-02-15 DIAGNOSIS — I700 Atherosclerosis of aorta: Secondary | ICD-10-CM | POA: Diagnosis not present

## 2017-02-15 DIAGNOSIS — I480 Paroxysmal atrial fibrillation: Secondary | ICD-10-CM | POA: Diagnosis not present

## 2017-02-15 DIAGNOSIS — I10 Essential (primary) hypertension: Secondary | ICD-10-CM | POA: Diagnosis not present

## 2017-02-15 DIAGNOSIS — I5043 Acute on chronic combined systolic (congestive) and diastolic (congestive) heart failure: Secondary | ICD-10-CM | POA: Diagnosis not present

## 2017-02-15 DIAGNOSIS — K449 Diaphragmatic hernia without obstruction or gangrene: Secondary | ICD-10-CM | POA: Diagnosis not present

## 2017-02-15 DIAGNOSIS — I517 Cardiomegaly: Secondary | ICD-10-CM | POA: Diagnosis not present

## 2017-02-23 DIAGNOSIS — Z23 Encounter for immunization: Secondary | ICD-10-CM | POA: Diagnosis not present

## 2017-02-23 DIAGNOSIS — M25569 Pain in unspecified knee: Secondary | ICD-10-CM | POA: Diagnosis not present

## 2017-03-06 ENCOUNTER — Other Ambulatory Visit: Payer: Self-pay | Admitting: Cardiology

## 2017-03-07 NOTE — Telephone Encounter (Signed)
This is Dr. Harding's pt. °

## 2017-03-08 ENCOUNTER — Encounter: Payer: Self-pay | Admitting: Cardiology

## 2017-03-08 ENCOUNTER — Ambulatory Visit (INDEPENDENT_AMBULATORY_CARE_PROVIDER_SITE_OTHER): Payer: Medicare HMO | Admitting: Cardiology

## 2017-03-08 VITALS — BP 154/78 | HR 96 | Ht 60.0 in | Wt 143.6 lb

## 2017-03-08 DIAGNOSIS — R55 Syncope and collapse: Secondary | ICD-10-CM | POA: Diagnosis not present

## 2017-03-08 DIAGNOSIS — I5032 Chronic diastolic (congestive) heart failure: Secondary | ICD-10-CM | POA: Diagnosis not present

## 2017-03-08 DIAGNOSIS — I4819 Other persistent atrial fibrillation: Secondary | ICD-10-CM

## 2017-03-08 DIAGNOSIS — I481 Persistent atrial fibrillation: Secondary | ICD-10-CM

## 2017-03-08 MED ORDER — CARVEDILOL 12.5 MG PO TABS: 18.7500 mg | ORAL_TABLET | Freq: Two times a day (BID) | ORAL | 3 refills | Status: DC

## 2017-03-08 NOTE — Patient Instructions (Addendum)
MEDICATION CHANGES   INCREASE CARVEDILOL TO 18.75 MG ( 1 AND 1/2 TABLETS) TWICE ADAY  GRADUAL INCREASE   THE FIRST WEEK TAKE 12.5 MG ( ONE  TABLET) IN THE MORNING  AND TAKE 18.75 MG IN THE EVENING.  THEN START TAKING 18.75 MG TWICE A DAY   PURCHASE  THIGH HI  COMPRESSION  OVER THE COUNTER  15-20 MMHG AND TRY PURCHASE ELEVATED LEG REST     Your physician wants you to follow-up in 6 MONTHS WITH DR HARDING.You will receive a reminder letter in the mail two months in advance. If you don't receive a letter, please call our office to schedule the follow-up appointment.

## 2017-03-08 NOTE — Progress Notes (Signed)
PCP: Townsend Roger, MD  Clinic Note: Chief Complaint  Patient presents with  . Follow-up  . Atrial Fibrillation  . Leg Swelling    HPI: Sara [name] is a 83 y.o. female with a PMH below who presents today for six-month follow-up for atrial fibrillation and chronic diastolic heart failure. - She has been very reluctant to undergo treatment for her A. fib such as cardioversion or lytic recorder etc. She also mentioned she did not tolerate several medications. His been very difficult to control. I last saw her back in March and she was doing much better. She still said that her heart rate go up and down and occasionally feeling skipping, but relatively stable. No chest tightness or pressure or significant heart failure symptoms. Only some mild edema.  Rosaria Kubin was last seen by Dr. Curt Bears on July 16 -- was converted from Metoprolol/Diltiazem to Coreg (she felt that the meds were causing edema).  Recent Hospitalizations: none  Studies Personally Reviewed - (if available, images/films reviewed: From Epic Chart or Care Everywhere)  October procedures updated in PSH/PMH.  Interval History: Satoya returns today with multiple complaints including feet and hand swelling, poor sleep and dry eyes. She also notes having some dark stool, but no real melena or hematochezia.  She really has no sense of being in her out of A. fib. She does note a little bit exertional dyspnea but doesn't really walk very much. She says that her swelling has gotten a lot better today, but she has had a lot of swelling more the left leg than the right off and on. She has some intermittent twinges in her chest, but no real anginal symptoms with rest or exertion. She does not describe PND or orthopnea, and does not really notice dyspnea with routine activity or even going up stairs. She just doesn't do a lot during the day. She does like to sleep, but her son indicates that she is on the go from the time she is up  until she goes to bed. Unfortunately she just does not sleep well. She thinks the swelling may have improved since switching to carvedilol. She is also taking furosemide once day as opposed twice a day.  Despite being in A. fib, she denies palpitations, lightheadedness, dizziness, weakness or syncope/near syncope. No TIA/amaurosis fugax symptoms. No melena, hematochezia, hematuria, or epstaxis. No claudication.  ROS: A comprehensive was performed. Review of Systems  HENT: Negative for congestion.   Cardiovascular: Positive for leg swelling.  Gastrointestinal: Negative for blood in stool and melena.       She has noted dark stools  Genitourinary: Negative for hematuria.  Musculoskeletal: Positive for back pain and joint pain. Negative for falls.  Neurological: Positive for dizziness (Positional).  Endo/Heme/Allergies: Bruises/bleeds easily.  Psychiatric/Behavioral: The patient has insomnia (Poor sleep. Has a hard time getting sleep, then wakes up the PT and does not get back to sleep).   All other systems reviewed and are negative.   I have reviewed and (if needed) personally updated the patient's problem list, medications, allergies, past medical and surgical history, social and family history.   Past Medical History:  Diagnosis Date  . Ankle edema    Mild, which is intermittent, usually mildly dependent, left slightly greater than the right.  . Anxiety   . Arthritis    In her knees  . Balance problem    Due to weakened knee  . CHF (congestive heart failure) ([place])   . 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 Developed after cataract surgery 05/03/09  . GERD (gastroesophageal reflux disease)   . H/O echocardiogram    Echo 03/01/11 EF = >55%. Shows normal systolic function with mild to moderate LV hypertrophy. Left atrial dimension 3.8 cm & a pulmonary artery pressure of 38. There was breakthrough diastolic dysfunction.  . Hematuria    Resolved  after coumadin was stopped  . Hiatal hernia    With GERD  . Hyperglycemia    Random, without any history of diabetes  . Hyperlipidemia   . Hypertension    Difficult to control. Renal Doppler 06/02/10 showed less that 60% bilateral renal artery narrowing.  . Hypokalemia   . Paroxysmal atrial fibrillation (HCC)    On amiodarone but not on anticoagulant  . Posthemorrhagic anemia   . Pseudophakia   . Syncope 02/22/11   During OV on 02/22/11 her INR was 7.3 and was given 2.5 mg of vitamin K. Later that day she became diaphoretic & fainted.  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 BREAST BIOPSY    . CARDIOVERSION N/A 03/31/2016   Procedure: CARDIOVERSION;  Surgeon: Jerline Pain, MD;  Location: [facility] ENDOSCOPY;  Service: Cardiovascular: Successful cardioversion from A. fib to sinus rhythm  . CATARACT EXTRACTION  05/03/09  . CHOLECYSTECTOMY    . HEMORRHOID SURGERY    . Remote hysterectomy    . TEE WITHOUT CARDIOVERSION N/A 03/31/2016   Procedure: TRANSESOPHAGEAL ECHOCARDIOGRAM (TEE);  Surgeon: Jerline Pain, MD;  Location: [facility];  Service: Cardiovascular:  EF 55-60%. No vegetation or thrombus okay for DC CV  . TONSILLECTOMY    . TOTAL KNEE ARTHROPLASTY Right 08/26/10   By Dr. Elta Guadeloupe C. Yates. Cemented, computer assist  . TRANSTHORACIC ECHOCARDIOGRAM  03/2016    Mild LVH. EF 65-70%. High LVEDP no RWMA. Severe LA dilation. Peak PAP ~ 37 mmHg    Current Meds  Medication Sig  . aspirin EC 81 MG tablet Take 81 mg by mouth daily.  . furosemide (LASIX) 40 MG tablet Take 1 tablet (40 mg total) by mouth 2 (two) times daily. May take additional tablet if weight gains is more than 3 pounds  . furosemide (LASIX) 40 MG tablet TAKE ONE TABLET BY MOUTH TWICE DAILY  . potassium chloride SA (K-DUR,KLOR-CON) 20 MEQ tablet Take 1 tablet (20 mEq total) by mouth 2 (two) times daily. Take extra dose if use extra tablet of furosemide  . potassium  chloride SA (K-DUR,KLOR-CON) 20 MEQ tablet TAKE ONE TABLET BY MOUTH TWICE DAILY  . rOPINIRole (REQUIP) 1 MG tablet Take 1 tablet by mouth at bedtime.   . [DISCONTINUED] carvedilol (COREG) 12.5 MG tablet Take 1 tablet (12.5 mg total) by mouth 2 (two) times daily.    Allergies  Allergen Reactions  . Amiodarone Shortness Of Breath    Stopped in Feb 2017 secondary to pulmonary complications  . Benicar [Olmesartan]     Unclear of the intolerance  . Bystolic [Nebivolol Hcl] Other (See Comments)    unknown  . Clonidine Derivatives Other (See Comments)    unknown  . Exforge [Amlodipine Besylate-Valsartan] Other (See Comments)    Unclear  . Hctz [Hydrochlorothiazide]     Currently taking without problem; question if this has to do with hypokalemia  . Hydralazine Other (See Comments)    Also tolerated, but had orthostatic changes on standing medication  .  Lisinopril Other (See Comments)    unknown  . Multaq [Dronedarone] Nausea And Vomiting  . Rythmol [Propafenone] Other (See Comments)    unknown  . Statins Other (See Comments)    unknown  . Tekturna [Aliskiren] Other (See Comments)    unknown  . Coreg [Carvedilol] Other (See Comments)    fatigue  . Prednisone     Jittery     Social History   Social History  . Marital status: Widowed    Spouse name: N/A  . Number of children: 1  . Years of education: N/A   Occupational History  .  Retired    Social History Main Topics  . Smoking status: Never Smoker  . Smokeless tobacco: Never Used  . Alcohol use No  . Drug use: No  . Sexual activity: Not Asked   Other Topics Concern  . None   Social History Narrative   She is a widowed mother of one and grandmother of one.  She does not exercise.  She does not smoke and does not drink alcohol.    family history includes Cancer in her mother; Coronary artery disease in her brother; Diabetes type II in her brother; Heart attack in her sister; Hypertension in her brother, mother, and  sister.  Wt Readings from Last 3 Encounters:  03/08/17 143 lb 9.6 oz (65.1 kg)  01/01/17 146 lb 3.2 oz (66.3 kg)  08/17/16 138 lb (62.6 kg)    PHYSICAL EXAM BP (!) 154/78   Pulse 96   Ht 5' (1.524 m)   Wt 143 lb 9.6 oz (65.1 kg)   BMI 28.04 kg/m  Physical Exam  Constitutional: She is oriented to person, place, and time. She appears well-developed and well-nourished. No distress.  Well-groomed.  HENT:  Head: Normocephalic and atraumatic.  Eyes: EOM are normal.  Neck: Normal range of motion. Neck supple. No hepatojugular reflux and no JVD present. Carotid bruit is not present.  Cardiovascular: Normal rate, normal heart sounds and intact distal pulses.  An irregularly irregular rhythm present. PMI is not displaced.  Exam reveals no gallop.   No murmur heard. Physiologically split S2  Pulmonary/Chest: Effort normal and breath sounds normal. No respiratory distress. She has no wheezes.  Abdominal: Soft. Bowel sounds are normal. She exhibits no distension. There is no tenderness. There is no rebound.  Musculoskeletal: Normal range of motion. She exhibits edema (1+ left lower extremity and trace right lower extremity edema).  Neurological: She is alert and oriented to person, place, and time.  Skin: Skin is warm. No rash noted. No erythema.  Psychiatric: She has a normal mood and affect. Her behavior is normal.  She seems to have poor short-term memory as far as what we discussed previously. She says the same things several times and asked the same questions over and over again    Adult ECG Report  Rate: 96 ;  Rhythm: atrial fibrillation; RBBB with T-wave inversions. Cannot exclude inferior ischemia.  Narrative Interpretation: Stable   Other studies Reviewed: Additional studies/ records that were reviewed today include:  Recent Labs:  No results found for: CHOL, HDL, LDLCALC, LDLDIRECT, TRIG, CHOLHDL - followed by PCP.  ASSESSMENT / PLAN: Problem List Items Addressed This Visit      Atypical syncope (Chronic)    No further episodes since June 2017      Relevant Medications   carvedilol (COREG) 12.5 MG tablet   Chronic diastolic CHF (congestive heart failure), NYHA class 1 (HCC)    Probably  more like class I CHF with really only having some lower TIMI edema. She doesn't have much the way of PND orthopnea. Exertional dyspnea, but that's related to deconditioning. She is on carvedilol but no other afterload reducing agents at this time. Could consider adding an ARB in the future, but for now but keeping simple. She is on furosemide for edema which I don't think is associated with heart failure. We talked about sliding scale Lasix and I want her to wear thigh-high support stockings.      Relevant Medications   carvedilol (COREG) 12.5 MG tablet   Other Relevant Orders   EKG 12-Lead (Completed)   Persistent atrial fibrillation (HCC) - Primary (Chronic)    Pretty much persistent A. fib now. She is probably in and out of A. fib and doesn't even know it. I don't think is exacerbating her heart failure that much. Rate controlled now with carvedilol. However with her blood pressures in the 150s and heart rate in 90s, think we can titrate further. We will increase to 18.75 mg twice a day. Not a very good candidate for antiarrhythmics. Would simply treat with rate control. We did not even discuss anticoagulation at this time because and the past she has been reluctant to take any more than aspirin. She has had a history of multiple falls and significant bruising.      Relevant Medications   carvedilol (COREG) 12.5 MG tablet   Other Relevant Orders   EKG 12-Lead (Completed)     Difficult historian, and difficult to explain things to her. As result, there was a prolonged visit because she asks the same questions over again several times in the course the visit. At least 35-40 minutes was spent with the patient. Greater than 50% of time was in direct patient contact.  Current  medicines are reviewed at length with the patient today. (+/- concerns) none The following changes have been made: Increase carvedilol to 18.75 mg twice a day  Patient Instructions  MEDICATION CHANGES   INCREASE CARVEDILOL TO 18.75 MG ( 1 AND 1/2 TABLETS) TWICE ADAY  GRADUAL INCREASE   THE FIRST WEEK TAKE 12.5 MG ( ONE  TABLET) IN THE MORNING  AND TAKE 18.75 MG IN THE EVENING.  THEN START TAKING 18.75 MG TWICE A DAY   PURCHASE  THIGH HI  COMPRESSION  OVER THE COUNTER  15-20 MMHG AND TRY PURCHASE ELEVATED LEG REST     Your physician wants you to follow-up in 6 MONTHS WITH DR Amberlea Spagnuolo.You will receive a reminder letter in the mail two months in advance. If you don't receive a letter, please call our office to schedule the follow-up appointment.      Studies Ordered:   Orders Placed This Encounter  Procedures  . EKG 12-Lead      Glenetta Hew, M.D., M.S. Interventional Cardiologist   Pager # [phone] Phone # [phone] [address] [place], [place] [address]

## 2017-03-10 ENCOUNTER — Encounter: Payer: Self-pay | Admitting: Cardiology

## 2017-03-10 NOTE — Assessment & Plan Note (Addendum)
Probably more like class I CHF with really only having some lower TIMI edema. She doesn't have much the way of PND orthopnea. Exertional dyspnea, but that's related to deconditioning. She is on carvedilol but no other afterload reducing agents at this time. Could consider adding an ARB in the future, but for now but keeping simple. She is on furosemide for edema which I don't think is associated with heart failure. We talked about sliding scale Lasix and I want her to wear thigh-high support stockings.

## 2017-03-10 NOTE — Assessment & Plan Note (Addendum)
Pretty much persistent A. fib now. She is probably in and out of A. fib and doesn't even know it. I don't think is exacerbating her heart failure that much. Rate controlled now with carvedilol. However with her blood pressures in the 150s and heart rate in 90s, think we can titrate further. We will increase to 18.75 mg twice a day. Not a very good candidate for antiarrhythmics. Would simply treat with rate control. We did not even discuss anticoagulation at this time because and the past she has been reluctant to take any more than aspirin. She has had a history of multiple falls and significant bruising.

## 2017-03-10 NOTE — Assessment & Plan Note (Signed)
No further episodes since June 2017 

## 2017-03-12 DIAGNOSIS — M25562 Pain in left knee: Secondary | ICD-10-CM | POA: Diagnosis not present

## 2017-03-12 DIAGNOSIS — Z96651 Presence of right artificial knee joint: Secondary | ICD-10-CM | POA: Diagnosis not present

## 2017-03-12 DIAGNOSIS — M1712 Unilateral primary osteoarthritis, left knee: Secondary | ICD-10-CM | POA: Diagnosis not present

## 2017-03-12 DIAGNOSIS — G8929 Other chronic pain: Secondary | ICD-10-CM | POA: Diagnosis not present

## 2017-03-12 DIAGNOSIS — Z9889 Other specified postprocedural states: Secondary | ICD-10-CM | POA: Diagnosis not present

## 2017-03-19 DIAGNOSIS — C50212 Malignant neoplasm of upper-inner quadrant of left female breast: Secondary | ICD-10-CM | POA: Diagnosis not present

## 2017-03-19 DIAGNOSIS — R922 Inconclusive mammogram: Secondary | ICD-10-CM | POA: Diagnosis not present

## 2017-03-22 DIAGNOSIS — Z170 Estrogen receptor positive status [ER+]: Secondary | ICD-10-CM | POA: Diagnosis not present

## 2017-03-22 DIAGNOSIS — Z79811 Long term (current) use of aromatase inhibitors: Secondary | ICD-10-CM | POA: Diagnosis not present

## 2017-03-22 DIAGNOSIS — Z853 Personal history of malignant neoplasm of breast: Secondary | ICD-10-CM | POA: Diagnosis not present

## 2017-03-22 DIAGNOSIS — C50919 Malignant neoplasm of unspecified site of unspecified female breast: Secondary | ICD-10-CM | POA: Diagnosis not present

## 2017-03-26 DIAGNOSIS — M1712 Unilateral primary osteoarthritis, left knee: Secondary | ICD-10-CM | POA: Diagnosis not present

## 2017-03-26 DIAGNOSIS — G8929 Other chronic pain: Secondary | ICD-10-CM | POA: Diagnosis not present

## 2017-06-01 DIAGNOSIS — I4891 Unspecified atrial fibrillation: Secondary | ICD-10-CM | POA: Diagnosis not present

## 2017-06-01 DIAGNOSIS — G2581 Restless legs syndrome: Secondary | ICD-10-CM | POA: Diagnosis not present

## 2017-06-01 DIAGNOSIS — I5032 Chronic diastolic (congestive) heart failure: Secondary | ICD-10-CM | POA: Diagnosis not present

## 2017-06-19 DIAGNOSIS — I739 Peripheral vascular disease, unspecified: Secondary | ICD-10-CM

## 2017-06-19 HISTORY — DX: Peripheral vascular disease, unspecified: I73.9

## 2017-07-04 DIAGNOSIS — R05 Cough: Secondary | ICD-10-CM | POA: Diagnosis not present

## 2017-07-04 DIAGNOSIS — J069 Acute upper respiratory infection, unspecified: Secondary | ICD-10-CM | POA: Diagnosis not present

## 2017-07-04 DIAGNOSIS — R062 Wheezing: Secondary | ICD-10-CM | POA: Diagnosis not present

## 2017-08-13 DIAGNOSIS — R531 Weakness: Secondary | ICD-10-CM | POA: Diagnosis not present

## 2017-08-13 DIAGNOSIS — G2581 Restless legs syndrome: Secondary | ICD-10-CM | POA: Diagnosis not present

## 2017-08-13 DIAGNOSIS — R200 Anesthesia of skin: Secondary | ICD-10-CM | POA: Diagnosis not present

## 2017-08-31 ENCOUNTER — Encounter: Payer: Self-pay | Admitting: Cardiology

## 2017-08-31 ENCOUNTER — Ambulatory Visit: Payer: Medicare HMO | Admitting: Cardiology

## 2017-08-31 VITALS — BP 143/84 | HR 102 | Ht 60.0 in | Wt 147.0 lb

## 2017-08-31 DIAGNOSIS — I5032 Chronic diastolic (congestive) heart failure: Secondary | ICD-10-CM

## 2017-08-31 DIAGNOSIS — I10 Essential (primary) hypertension: Secondary | ICD-10-CM

## 2017-08-31 DIAGNOSIS — I4819 Other persistent atrial fibrillation: Secondary | ICD-10-CM

## 2017-08-31 DIAGNOSIS — I481 Persistent atrial fibrillation: Secondary | ICD-10-CM

## 2017-08-31 NOTE — Patient Instructions (Addendum)
Medication Instructions: CARVEDILOL: For two weeks take 1.5 tablets (18.75 mg) in the morning and then 1 tablet (12.5 mg) in the evening. After two weeks, increase this to 1.5 (18.75) tablets twice daily.  If you need a refill on your cardiac medications before your next appointment, please call your pharmacy.    Follow-Up: Your physician wants you to follow-up in: 6 months with Dr. Ellyn Hack. You will receive a reminder letter in the mail two months in advance. If you don't receive a letter, please call our office at [phone] to schedule this follow-up appointment.   Thank you for choosing [facility] at [facility]!!

## 2017-08-31 NOTE — Progress Notes (Signed)
PCP: Townsend Roger, MD  Clinic Note: Chief Complaint  Patient presents with  . Follow-up  . Atrial Fibrillation    HPI: Sara [name] is a 84 y.o. female with a PMH below who presents today for 54-month follow-up for atrial fibrillation and chronic diastolic heart failure. -  She has been very reluctant to undergo treatment for her A. fib such as cardioversion. She also mentioned she did not tolerate several medications. His been very difficult to control.  Sara [name] was last seen by Dr. Curt Bears on July 16 -- was converted from Metoprolol/Diltiazem to Coreg (she felt that the meds were causing edema). I then saw her in follow-up on March 08, 2017: She had multiple complaints including swelling in her hands and feet poor sleep and dry eyes.  Otherwise no sensation of A. fib.  Intermittent chest twinges but no significant symptoms.  Recent Hospitalizations: none  Studies Personally Reviewed - (if available, images/films reviewed: From Epic Chart or Care Everywhere)  nothing since October 2017   Interval History: Sara [name] returns today pretty much still not noticing at all that she is in A. fib.  She occasionally complains of positional swimmy headedness, but really no syncope or near syncope.  No TIA or amaurosis fugax symptoms. She still has poor sleep waking up and then go back to sleep.  Difficult staying asleep.  No orthopnea or PND however.  She still has mild lower extremity edema but nothing significant.  No further chest tightness/pressure or even twinges in her chest. No melena, hematochezia or hematuria or epistaxis.  No claudication. Swelling improved since switching to call carvedilol and taking her Lasix.  She only has minimal edema now.  ROS: A comprehensive was performed. Review of Systems  Constitutional: Negative for malaise/fatigue.  HENT: Negative for congestion.   Eyes:       Episode about a week ago where she is already green lights in her eyes when  trying to sleep.  Cardiovascular: Positive for leg swelling (Controlled with Lasix and elevating her feet).  Gastrointestinal: Negative for blood in stool and melena.       She has noted dark stools  Genitourinary: Negative for hematuria.  Musculoskeletal: Positive for back pain and joint pain. Negative for falls.  Neurological: Positive for dizziness (Positional).  Endo/Heme/Allergies: Bruises/bleeds easily.  Psychiatric/Behavioral: The patient has insomnia (Poor sleep. Has a hard time getting sleep, then wakes up and has a hard time going back to sleep).        Relatively poor historian.  All other systems reviewed and are negative.   I have reviewed and (if needed) personally updated the patient's problem list, medications, allergies, past medical and surgical history, social and family history.   Past Medical History:  Diagnosis Date  . Ankle edema    Mild, which is intermittent, usually mildly dependent, left slightly greater than the right.  . Anxiety   . Arthritis    In her knees  . Balance problem    Due to weakened knee  . CHF (congestive heart failure) ([place])   . Corneal edema    Severe corneal edema in right eye with multiple folds. Developed after cataract surgery 05/03/09  . GERD (gastroesophageal reflux disease)   . H/O echocardiogram    Echo 03/01/11 EF = >55%. Shows normal systolic function with mild to moderate LV hypertrophy. Left atrial dimension 3.8 cm & a pulmonary artery pressure of 38. There was breakthrough diastolic dysfunction.  . Hematuria  Resolved after coumadin was stopped  . Hiatal hernia    With GERD  . Hyperglycemia    Random, without any history of diabetes  . Hyperlipidemia   . Hypertension    Difficult to control. Renal Doppler 06/02/10 showed less that 60% bilateral renal artery narrowing.  . Hypokalemia   . Paroxysmal atrial fibrillation (HCC)    On amiodarone but not on anticoagulant  . Posthemorrhagic anemia   . Pseudophakia   .  Syncope 02/22/11   During OV on 02/22/11 her INR was 7.3 and was given 2.5 mg of vitamin K. Later that day she became diaphoretic & fainted.  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 BREAST BIOPSY    . CARDIOVERSION N/A 03/31/2016   Procedure: CARDIOVERSION;  Surgeon: Jerline Pain, MD;  Location: [facility] ENDOSCOPY;  Service: Cardiovascular: Successful cardioversion from A. fib to sinus rhythm  . CATARACT EXTRACTION  05/03/09  . CHOLECYSTECTOMY    . HEMORRHOID SURGERY    . Remote hysterectomy    . TEE WITHOUT CARDIOVERSION N/A 03/31/2016   Procedure: TRANSESOPHAGEAL ECHOCARDIOGRAM (TEE);  Surgeon: Jerline Pain, MD;  Location: [facility];  Service: Cardiovascular:  EF 55-60%. No vegetation or thrombus okay for DC CV  . TONSILLECTOMY    . TOTAL KNEE ARTHROPLASTY Right 08/26/10   By Dr. Elta Guadeloupe C. Yates. Cemented, computer assist  . TRANSTHORACIC ECHOCARDIOGRAM  03/2016    Mild LVH. EF 65-70%. High LVEDP no RWMA. Severe LA dilation. Peak PAP ~ 37 mmHg   Current Outpatient Medications on File Prior to Visit  Medication Sig Dispense Refill  . aspirin EC 81 MG tablet Take 81 mg by mouth daily.    . carvedilol (COREG) 12.5 MG tablet Take 1.5 tablets (18.75 mg total) by mouth 2 (two) times daily. 270 tablet 3  . potassium chloride SA (K-DUR,KLOR-CON) 20 MEQ tablet Take 1 tablet (20 mEq total) by mouth 2 (two) times daily. Take extra dose if use extra tablet of furosemide 90 tablet 6  . potassium chloride SA (K-DUR,KLOR-CON) 20 MEQ tablet TAKE ONE TABLET BY MOUTH TWICE DAILY 60 tablet 8  . rOPINIRole (REQUIP) 1 MG tablet Take 1 tablet by mouth at bedtime.   3   No current facility-administered medications on file prior to visit.     Allergies  Allergen Reactions  . Amiodarone Shortness Of Breath    Stopped in Feb 2017 secondary to pulmonary complications  . Aliskiren Other (See Comments) and Nausea Only    unknown  . Amlodipine  Besylate-Valsartan Other (See Comments) and Nausea Only    Unclear  . Bystolic [Nebivolol Hcl] Other (See Comments)    unknown  . Clonidine Derivatives Other (See Comments)    unknown  . Dronedarone Nausea And Vomiting and Nausea Only  . Hctz [Hydrochlorothiazide]     Currently taking without problem; question if this has to do with hypokalemia  . Hydralazine Other (See Comments)    Also tolerated, but had orthostatic changes on standing medication  . Lisinopril Other (See Comments) and Nausea Only    unknown  . Nebivolol Nausea Only  . Olmesartan Nausea Only    Unclear of the intolerance  . Rythmol [Propafenone] Other (See Comments)    unknown  . Statins Other (See Comments) and Nausea Only    unknown  . Carvedilol Other (See Comments) and Nausea And Vomiting    fatigue  . Prednisone Nausea And  Vomiting    Jittery     Social History   Socioeconomic History  . Marital status: Widowed    Spouse name: None  . Number of children: 1  . Years of education: None  . Highest education level: None  Social Needs  . Financial resource strain: None  . Food insecurity - worry: None  . Food insecurity - inability: None  . Transportation needs - medical: None  . Transportation needs - non-medical: None  Occupational History    Employer: RETIRED   Tobacco Use  . Smoking status: Never Smoker  . Smokeless tobacco: Never Used  Substance and Sexual Activity  . Alcohol use: No  . Drug use: No  . Sexual activity: None  Other Topics Concern  . None  Social History Narrative   She is a widowed mother of one and grandmother of one.  She does not exercise.  She does not smoke and does not drink alcohol.    family history includes Cancer in her mother; Coronary artery disease in her brother; Diabetes type II in her brother; Heart attack in her sister; Hypertension in her brother, mother, and sister.  Wt Readings from Last 3 Encounters:  08/31/17 147 lb (66.7 kg)  03/08/17 143 lb 9.6  oz (65.1 kg)  01/01/17 146 lb 3.2 oz (66.3 kg)    PHYSICAL EXAM BP (!) 143/84   Pulse (!) 102   Ht 5' (1.524 m)   Wt 147 lb (66.7 kg)   BMI 28.71 kg/m  Physical Exam  Constitutional: She is oriented to person, place, and time. She appears well-developed and well-nourished. No distress.  Well-groomed.  HENT:  Head: Normocephalic and atraumatic.  Eyes: EOM are normal.  Neck: Normal range of motion. Neck supple. No hepatojugular reflux and no JVD present. Carotid bruit is not present.  Cardiovascular: Normal rate, normal heart sounds and intact distal pulses. An irregularly irregular rhythm present. PMI is not displaced. Exam reveals no gallop.  No murmur heard. Physiologically split S2  Pulmonary/Chest: Effort normal and breath sounds normal. No respiratory distress. She has no wheezes.  Abdominal: Soft. Bowel sounds are normal. She exhibits no distension. There is no tenderness. There is no rebound.  Musculoskeletal: Normal range of motion. She exhibits edema (1+ left lower extremity and trace right lower extremity edema).  Neurological: She is alert and oriented to person, place, and time.  Skin: Skin is warm. No rash noted. No erythema.  Psychiatric: She has a normal mood and affect. Her behavior is normal.  Notable poor short-term memory --  he says the same things several times and asked the same questions over and over again    Adult ECG Report  Rate: 102;  Rhythm: atrial fibrillation; RBBB with T-wave inversions. Cannot exclude inferior ischemia.  ST and T wave changes are likely related to RBBB repolarization)  Narrative Interpretation: Stable   Other studies Reviewed: Additional studies/ records that were reviewed today include:  Recent Labs:  No results found for: CHOL, HDL, LDLCALC, LDLDIRECT, TRIG, CHOLHDL - followed by PCP.No labs available  ASSESSMENT / PLAN: Problem List Items Addressed This Visit    Persistent atrial fibrillation (HCC) (Chronic)    Essentially  persistent/permanent atrial fibrillation.  She may be going in and out of it, but she does not sense it so it is difficult to tell.  Her rate is borderline controlled. Plan: Gradually titrate her carvedilol up to 18.75 mg twice daily.  This was not done according to previous instructions.  Not a good candidate for antiarrhythmics.  Amiodarone was stopped due to concern for pulmonary toxicity.  Was not felt to be a candidate for sotalol or Tikosyn.  Not interested in anticoagulation because of falls and bruising.  Continue aspirin.      Essential hypertension - Primary (Chronic)    Blood pressure still borderline elevated today.  We are increasing carvedilol 18.75 mg twice daily.  Anticipate fully titrating to 25 mg twice daily at next visit. Really, she has had orthostatic symptoms with aggressive control, milligrams and hold off on nuclear reduction etc. or ARB for now.      Relevant Orders   EKG 12-Lead (Completed)   Chronic diastolic CHF (congestive heart failure), NYHA class 1 (HCC) (Chronic)    Mostly class I-II symptoms with edema only.  Exacerbated by RVR. We are increasing carvedilol for better rate control and afterload reduction. Continue current dose of Lasix with as needed dosing/sliding scale. Also continue to recommend thigh-high support stockings which she does not wear all the time. In the future could consider ARB or ACE inhibitor for afterload reduction.        Difficult historian, and difficult to explain things to her. thankfully, her son is with her today.  Current medicines are reviewed at length with the patient today. (+/- concerns) none The following changes have been made: Increase carvedilol to 18.75 mg twice a day  Patient Instructions  Medication Instructions: CARVEDILOL: For two weeks take 1.5 tablets (18.75 mg) in the morning and then 1 tablet (12.5 mg) in the evening. After two weeks, increase this to 1.5 (18.75) tablets twice daily.  If you need a  refill on your cardiac medications before your next appointment, please call your pharmacy.    Follow-Up: Your physician wants you to follow-up in: 6 months with Dr. Ellyn Hack. You will receive a reminder letter in the mail two months in advance. If you don't receive a letter, please call our office at [phone] to schedule this follow-up appointment.   Thank you for choosing [facility] at [facility]!!        Studies Ordered:   Orders Placed This Encounter  Procedures  . EKG 12-Lead      Glenetta Hew, M.D., M.S. Interventional Cardiologist   Pager # [phone] Phone # [phone] [address] [place], [place] [address]

## 2017-09-03 ENCOUNTER — Encounter: Payer: Self-pay | Admitting: Cardiology

## 2017-09-03 ENCOUNTER — Other Ambulatory Visit: Payer: Self-pay | Admitting: Cardiology

## 2017-09-03 NOTE — Assessment & Plan Note (Addendum)
Blood pressure still borderline elevated today.  We are increasing carvedilol 18.75 mg twice daily.  Anticipate fully titrating to 25 mg twice daily at next visit. Really, she has had orthostatic symptoms with aggressive control, milligrams and hold off on nuclear reduction etc. or ARB for now.

## 2017-09-03 NOTE — Assessment & Plan Note (Signed)
Mostly class I-II symptoms with edema only.  Exacerbated by RVR. We are increasing carvedilol for better rate control and afterload reduction. Continue current dose of Lasix with as needed dosing/sliding scale. Also continue to recommend thigh-high support stockings which she does not wear all the time. In the future could consider ARB or ACE inhibitor for afterload reduction.

## 2017-09-03 NOTE — Telephone Encounter (Signed)
REFILL 

## 2017-09-03 NOTE — Assessment & Plan Note (Signed)
Essentially persistent/permanent atrial fibrillation.  She may be going in and out of it, but she does not sense it so it is difficult to tell.  Her rate is borderline controlled. Plan: Gradually titrate her carvedilol up to 18.75 mg twice daily.  This was not done according to previous instructions.  Not a good candidate for antiarrhythmics.  Amiodarone was stopped due to concern for pulmonary toxicity.  Was not felt to be a candidate for sotalol or Tikosyn.  Not interested in anticoagulation because of falls and bruising.  Continue aspirin.

## 2017-09-07 ENCOUNTER — Telehealth: Payer: Self-pay | Admitting: Cardiology

## 2017-09-07 MED ORDER — CARVEDILOL 12.5 MG PO TABS: ORAL_TABLET | ORAL | 3 refills | Status: DC

## 2017-09-07 NOTE — Telephone Encounter (Signed)
While I have a hard time believing that having a HR in 90s & BP in 120s will cause her to be fatigued, I am not going to push the issue.   Lets see if she can tolerate the other direction - 18.75 in PM & 12.5 in AM.    If still noting fatigue - just go back to 12.5 mg bid.     Glenetta Hew, MD

## 2017-09-07 NOTE — Telephone Encounter (Signed)
Sara [name] is calling to see if Dr. Ellyn Hack will decrease her Coreg back to the dosage in which she was taking , she is unable to take the increased dosage . Please call

## 2017-09-07 NOTE — Telephone Encounter (Signed)
Patient made aware of Dr. Allison Quarry recommendations. She will call back on Monday with an update.

## 2017-09-07 NOTE — Telephone Encounter (Signed)
Returned the call to the patient. She stated that since she has been taking the increased Carvedilol, 18.75 in the morning and 12.5 mg in the evening, that she has been tired and weak.    She has not been taking her blood pressure and heart rate regularly. Today while on the phone it was 124/74 and heart rate was 90. She would like to stay at 12.5 bid. Message has been routed.

## 2017-09-28 DIAGNOSIS — C50919 Malignant neoplasm of unspecified site of unspecified female breast: Secondary | ICD-10-CM | POA: Diagnosis not present

## 2017-09-28 DIAGNOSIS — Z170 Estrogen receptor positive status [ER+]: Secondary | ICD-10-CM | POA: Diagnosis not present

## 2017-09-28 DIAGNOSIS — Z79811 Long term (current) use of aromatase inhibitors: Secondary | ICD-10-CM | POA: Diagnosis not present

## 2017-09-28 DIAGNOSIS — Z853 Personal history of malignant neoplasm of breast: Secondary | ICD-10-CM | POA: Diagnosis not present

## 2017-11-05 DIAGNOSIS — M62838 Other muscle spasm: Secondary | ICD-10-CM | POA: Diagnosis not present

## 2017-11-05 DIAGNOSIS — M25532 Pain in left wrist: Secondary | ICD-10-CM | POA: Diagnosis not present

## 2017-11-05 DIAGNOSIS — M25562 Pain in left knee: Secondary | ICD-10-CM | POA: Diagnosis not present

## 2017-11-16 ENCOUNTER — Ambulatory Visit (INDEPENDENT_AMBULATORY_CARE_PROVIDER_SITE_OTHER): Payer: Self-pay

## 2017-11-16 ENCOUNTER — Ambulatory Visit (INDEPENDENT_AMBULATORY_CARE_PROVIDER_SITE_OTHER): Payer: Medicare HMO | Admitting: Orthopaedic Surgery

## 2017-11-16 ENCOUNTER — Encounter (INDEPENDENT_AMBULATORY_CARE_PROVIDER_SITE_OTHER): Payer: Self-pay | Admitting: Orthopaedic Surgery

## 2017-11-16 VITALS — BP 177/89 | HR 96 | Ht 60.0 in | Wt 144.0 lb

## 2017-11-16 DIAGNOSIS — M25562 Pain in left knee: Secondary | ICD-10-CM

## 2017-11-16 DIAGNOSIS — M67432 Ganglion, left wrist: Secondary | ICD-10-CM | POA: Diagnosis not present

## 2017-11-16 DIAGNOSIS — G8929 Other chronic pain: Secondary | ICD-10-CM | POA: Diagnosis not present

## 2017-11-16 DIAGNOSIS — M1712 Unilateral primary osteoarthritis, left knee: Secondary | ICD-10-CM

## 2017-11-16 DIAGNOSIS — Z96651 Presence of right artificial knee joint: Secondary | ICD-10-CM

## 2017-11-16 DIAGNOSIS — M25561 Pain in right knee: Secondary | ICD-10-CM

## 2017-11-16 DIAGNOSIS — M79642 Pain in left hand: Secondary | ICD-10-CM

## 2017-11-16 DIAGNOSIS — Z96652 Presence of left artificial knee joint: Secondary | ICD-10-CM | POA: Insufficient documentation

## 2017-11-16 MED ORDER — LIDOCAINE HCL 1 % IJ SOLN
0.3000 mL | INTRAMUSCULAR | Status: AC | PRN
  Administered 2017-11-16: .3 mL

## 2017-11-16 NOTE — Progress Notes (Signed)
Office Visit Note   Patient: Sara [name]           Date of Birth: 04-05-1926           MRN: [id number] Visit Date: 11/16/2017              Requested by: Sara Roger, MD [place], [place] [address] PCP: Sara Roger, MD   Assessment & Plan: Visit Diagnoses:  1. Chronic [name] of both knees   2. [name] in left hand   3. Ganglion of left wrist   4. Unilateral primary osteoarthritis, left knee   5. Hx of total knee arthroplasty, right     Plan: Ganglion left dorsal wrist aspirated 15 cc yellow serous fluid not as thick as typical ganglion fluid.  She got relief post injection.  All extensor tendons are functioning.  No subluxation.  She can return in 1 month for recheck of her left knee we can consider a possible cortisone injection at that time.  Follow-Up Instructions: Return in about 1 month (around 12/16/2017).   Orders:  Orders Placed This Encounter  Procedures  . Hand/UE Inj: L wrist  . XR Hand Complete Left  . XR Knee 1-2 Views Right  . XR Knee 1-2 Views Left   No orders of the defined types were placed in this encounter.     Procedures: Hand/UE Inj: L wrist for dorsal carpal ganglion on 11/16/2017 3:03 PM Medications: 0.3 mL lidocaine 1 %      Clinical Data: No additional findings.   Subjective: Chief Complaint  Patient presents with  . Left Knee - [name]  . Right Knee - [name]  . Left Hand - [name]    HPI 84 year old female is here with bilateral knee [name].  She had previous right total knee arthroplasty 7 years ago by me doing well.  Left knee painful swollen has some lateral subluxation bone-on-bone changes and she states she is being treated for heart failure currently.  Patient was trying to get up with a walker felt a sharp pop on the dorsum of her left wrist with swelling since that time and enlarged bilobed ganglion has developed that extends out to the mid metacarpal region.  She has [name] swelling difficulty when she uses the hand.   She is right-hand dominant.  Review of Systems positive for hypertension chronic atrial fib class I chronic diastolic heart failure.  Previous right total knee arthroplasty 2012.  Left knee severe osteoarthritis bone-on-bone changes.   Objective: Vital Signs: BP (!) 177/89   Pulse 96   Ht 5' (1.524 m)   Wt 144 lb (65.3 kg)   BMI 28.12 kg/m   Physical Exam  Constitutional: She is oriented to person, place, and time. She appears well-developed.  HENT:  Head: Normocephalic.  Right Ear: External ear normal.  Left Ear: External ear normal.  Eyes: Pupils are equal, round, and reactive to light.  Neck: No tracheal deviation present. No thyromegaly present.  Cardiovascular:  afib  Pulmonary/Chest: Effort normal.  Abdominal: Soft.  Neurological: She is alert and oriented to person, place, and time.  Skin: Skin is warm and dry.  Psychiatric: She has a normal mood and affect. Her behavior is normal.    Ortho Exam crepitus of left knee range of motion left greater than right lower extremity pitting edema without venous stasis ulceration.  Distal pulses palpable.  Left dorsal wrist bilobed ganglion extending out over the extensor tendons.  Small  component over the dorsal wrist joint.  Specialty Comments:  No specialty comments available.  Imaging: No results found.   PMFS History: Patient Active Problem List   Diagnosis Date Noted  . Hypokalemia   . Chronic diastolic CHF (congestive heart failure), NYHA class 1 (Sara [name]) 05/16/2016  . Gait abnormality 04/11/2016  . History of breast cancer 04/11/2016  . Persistent atrial fibrillation (Sara [name]) 03/29/2016  . Paroxysmal atrial fibrillation (Sara [name]) 03/29/2016  . Atypical syncope 03/26/2016  . 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 Obesity (BMI 30-39.9) 03/18/2013  . Essential hypertension 03/18/2013   Past Medical History:  Diagnosis Date  . Ankle edema    Mild, which is intermittent, usually mildly dependent, left slightly greater  than the right.  . Anxiety   . Arthritis    In her knees  . Balance problem    Due to weakened knee  . CHF (congestive heart failure) (Sara [name])   . Corneal edema    Severe corneal edema in right eye with multiple folds. Developed after cataract surgery 05/03/09  . GERD (gastroesophageal reflux disease)   . H/O echocardiogram    Echo 03/01/11 EF = >55%. Shows normal systolic function with mild to moderate LV hypertrophy. Left atrial dimension 3.8 cm & a pulmonary artery pressure of 38. There was breakthrough diastolic dysfunction.  . Hematuria    Resolved after coumadin was stopped  . Hiatal hernia    With GERD  . Hyperglycemia    Random, without any history of diabetes  . Hyperlipidemia   . Hypertension    Difficult to control. Renal Doppler 06/02/10 showed less that 60% bilateral renal artery narrowing.  . Hypokalemia   . Paroxysmal atrial fibrillation (HCC)    On amiodarone but not on anticoagulant  . Posthemorrhagic anemia   . Pseudophakia   . Syncope 02/22/11   During OV on 02/22/11 her INR was 7.3 and was given 2.5 mg of vitamin K. Later that day she became diaphoretic & fainted.  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 Cancer Mother        Mouth  . Hypertension Mother   . Coronary artery disease Brother   . Hypertension Brother   . Diabetes type II Brother   . Heart attack Sister   . Hypertension Sister     Past Surgical History:  Procedure Laterality Date  . BREAST BIOPSY    . CARDIOVERSION N/A 03/31/2016   Procedure: CARDIOVERSION;  Surgeon: Sara Pain, MD;  Location: [facility] ENDOSCOPY;  Service: Cardiovascular: Successful cardioversion from A. fib to sinus rhythm  . CATARACT EXTRACTION  05/03/09  . CHOLECYSTECTOMY    . HEMORRHOID SURGERY    . Remote hysterectomy    . TEE WITHOUT CARDIOVERSION N/A 03/31/2016   Procedure: TRANSESOPHAGEAL ECHOCARDIOGRAM (TEE);  Surgeon: Sara Pain, MD;  Location: [facility];   Service: Cardiovascular:  EF 55-60%. No vegetation or thrombus okay for DC CV  . TONSILLECTOMY    . TOTAL KNEE ARTHROPLASTY Right 08/26/10   By Dr. Elta Guadeloupe C. Antero [name]. Cemented, computer assist  . TRANSTHORACIC ECHOCARDIOGRAM  03/2016    Mild LVH. EF 65-70%. High LVEDP no RWMA. Severe LA dilation. Peak PAP ~ 37 mmHg   Social History   Occupational History    Employer: RETIRED   Tobacco Use  . Smoking status: Never Smoker  . Smokeless tobacco: Never Used  Substance and Sexual Activity  . Alcohol use: No  .  Drug use: No  . Sexual activity: Not on file       

## 2018-01-01 DIAGNOSIS — R200 Anesthesia of skin: Secondary | ICD-10-CM | POA: Diagnosis not present

## 2018-01-01 DIAGNOSIS — K582 Mixed irritable bowel syndrome: Secondary | ICD-10-CM | POA: Diagnosis not present

## 2018-01-01 DIAGNOSIS — I5032 Chronic diastolic (congestive) heart failure: Secondary | ICD-10-CM | POA: Diagnosis not present

## 2018-01-04 ENCOUNTER — Ambulatory Visit (INDEPENDENT_AMBULATORY_CARE_PROVIDER_SITE_OTHER): Payer: Medicare HMO | Admitting: Orthopaedic Surgery

## 2018-01-05 DIAGNOSIS — T148XXA Other injury of unspecified body region, initial encounter: Secondary | ICD-10-CM | POA: Diagnosis not present

## 2018-01-05 DIAGNOSIS — M1712 Unilateral primary osteoarthritis, left knee: Secondary | ICD-10-CM | POA: Diagnosis not present

## 2018-01-05 DIAGNOSIS — M25552 Pain in left hip: Secondary | ICD-10-CM | POA: Diagnosis not present

## 2018-01-05 DIAGNOSIS — R609 Edema, unspecified: Secondary | ICD-10-CM | POA: Diagnosis not present

## 2018-01-05 DIAGNOSIS — M25562 Pain in left knee: Secondary | ICD-10-CM | POA: Diagnosis not present

## 2018-01-05 DIAGNOSIS — R0902 Hypoxemia: Secondary | ICD-10-CM | POA: Diagnosis not present

## 2018-01-07 ENCOUNTER — Telehealth (INDEPENDENT_AMBULATORY_CARE_PROVIDER_SITE_OTHER): Payer: Self-pay | Admitting: Orthopaedic Surgery

## 2018-01-07 NOTE — Telephone Encounter (Signed)
Yes OK. thanks

## 2018-01-07 NOTE — Telephone Encounter (Signed)
Patient said she went to [facility] and they x-rayed her left leg and gave her tylenol but nothing else. She also doesn't know if she would be a surgical candidate but as of right now she is in a wheelchair and can not put any weight on left leg. She wants to know what her options are. Patients # [phone]

## 2018-01-07 NOTE — Telephone Encounter (Signed)
Patient given work in appt for 1045 tomorrow.

## 2018-01-07 NOTE — Telephone Encounter (Signed)
Please advise. Would you like for me to have patient make follow up appointment?

## 2018-01-08 ENCOUNTER — Encounter (INDEPENDENT_AMBULATORY_CARE_PROVIDER_SITE_OTHER): Payer: Self-pay | Admitting: Orthopaedic Surgery

## 2018-01-08 ENCOUNTER — Ambulatory Visit (INDEPENDENT_AMBULATORY_CARE_PROVIDER_SITE_OTHER): Payer: Medicare HMO | Admitting: Orthopaedic Surgery

## 2018-01-08 VITALS — BP 124/73 | HR 88 | Ht 60.0 in | Wt 145.0 lb

## 2018-01-08 DIAGNOSIS — M1712 Unilateral primary osteoarthritis, left knee: Secondary | ICD-10-CM | POA: Diagnosis not present

## 2018-01-08 NOTE — Progress Notes (Signed)
Office Visit Note   Patient: Sara [name]           Date of Birth: 07-11-1925           MRN: [id number] Visit Date: 01/08/2018              Requested by: Townsend Roger, MD [place], [place] [address] PCP: Townsend Roger, MD   Assessment & Plan: Visit Diagnoses:  1. Unilateral primary osteoarthritis, left knee     Plan: Left knee osteoarthritis is progressed the point where she cannot walk.  She only transfers to wheelchair has difficulty making it to the bathroom.  She had 5 injections without relief she is had to use a walker just to stand.  Can talk with her cardiologist if she can obtain cardiology clearance plan will be left total knee arthroplasty under spinal anesthesia.  With her history of atrial fib surgery put her on chemical DVT prophylaxis.  After months she could resume her normal aspirin therapy.  Procedure discussed.  Patient states she like to proceed with surgical scheduling.  Follow-Up Instructions: No follow-ups on file.   Orders:  No orders of the defined types were placed in this encounter.  No orders of the defined types were placed in this encounter.     Procedures: No procedures performed   Clinical Data: No additional findings.   Subjective: Chief Complaint  Patient presents with  . Left Knee - Pain    HPI 84 year old female returns for severe left knee osteoarthritis.  She had gone to [facility] emergency room because she could not stand and now cannot walk on her knee which shows severe bone-on-bone changes and medial subluxation femur on the tibia.  She has had 4 different injections in her knee without relief.  She has severe catching in her knee if she is able to stand sometimes she has stand for several minutes before she can take just a few steps her knee catches again with severe pain.  She does have history of diastolic heart failure atrial fib had not wanted conversion treatment per cardiology notes.  Previous total knee  arthroplasty on the opposite side is done well but she is had some aching in the pes bursa region which is been persistent but right knee really does not bother her much compared to the severe left knee pain that stops her from standing and currently she cannot walk.  Is only been transferring to wheelchair.  Patient states the knee arthritis is progressed that she is now ready to proceed with surgery.  Review of Systems review of systems positive for hypertension, chronic atrial fib, class I chronic diastolic heart failure.  Previous right total knee arthroplasty 2012.  Left knee severe osteoarthritis with subluxation bone-on-bone changes.  Negative for dyspnea.  Chest pain.   Objective: Vital Signs: BP 124/73   Pulse 88   Ht 5' (1.524 m)   Wt 145 lb (65.8 kg)   BMI 28.32 kg/m   Physical Exam  Constitutional: She is oriented to person, place, and time. She appears well-developed.  HENT:  Head: Normocephalic.  Right Ear: External ear normal.  Left Ear: External ear normal.  Eyes: Pupils are equal, round, and reactive to light.  Neck: No tracheal deviation present. No thyromegaly present.  Cardiovascular:  Irregular rate consistent with A. fib.  Pulmonary/Chest: Effort normal.  Abdominal: Soft.  Neurological: She is alert and oriented to person, place, and time.  Skin: Skin is  warm and dry.  Psychiatric: She has a normal mood and affect. Her behavior is normal.    Ortho Exam patient is at 90 degrees can only extend her knee to 45 degrees due to sharp pain.  Passively can get her to 10 degrees within full extension.  Collateral ligaments are stable there is 2+ knee effusion.  Distal pulses palpable.  Specialty Comments:  No specialty comments available.  Imaging: No results found.   PMFS History: Patient Active Problem List   Diagnosis Date Noted  . Unilateral primary osteoarthritis, left knee 01/08/2018  . Hx of total knee arthroplasty, right 11/16/2017  . Hypokalemia   .  Chronic diastolic CHF (congestive heart failure), NYHA class 1 ([place]) 05/16/2016  . Gait abnormality 04/11/2016  . History of breast cancer 04/11/2016  . Persistent atrial fibrillation ([place]) 03/29/2016  . Paroxysmal atrial fibrillation ([place]) 03/29/2016  . Atypical syncope 03/26/2016  . 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 Obesity (BMI 30-39.9) 03/18/2013  . Essential hypertension 03/18/2013   Past Medical History:  Diagnosis Date  . Ankle edema    Mild, which is intermittent, usually mildly dependent, left slightly greater than the right.  . Anxiety   . Arthritis    In her knees  . Balance problem    Due to weakened knee  . CHF (congestive heart failure) ([place])   . Corneal edema    Severe corneal edema in right eye with multiple folds. Developed after cataract surgery 05/03/09  . GERD (gastroesophageal reflux disease)   . H/O echocardiogram    Echo 03/01/11 EF = >55%. Shows normal systolic function with mild to moderate LV hypertrophy. Left atrial dimension 3.8 cm & a pulmonary artery pressure of 38. There was breakthrough diastolic dysfunction.  . Hematuria    Resolved after coumadin was stopped  . Hiatal hernia    With GERD  . Hyperglycemia    Random, without any history of diabetes  . Hyperlipidemia   . Hypertension    Difficult to control. Renal Doppler 06/02/10 showed less that 60% bilateral renal artery narrowing.  . Hypokalemia   . Paroxysmal atrial fibrillation (HCC)    On amiodarone but not on anticoagulant  . Posthemorrhagic anemia   . Pseudophakia   . Syncope 02/22/11   During OV on 02/22/11 her INR was 7.3 and was given 2.5 mg of vitamin K. Later that day she became diaphoretic & fainted.  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 Cancer Mother        Mouth  . Hypertension Mother   . Coronary artery disease Brother   . Hypertension Brother   . Diabetes type II Brother   . Heart attack Sister   .  Hypertension Sister     Past Surgical History:  Procedure Laterality Date  . BREAST BIOPSY    . CARDIOVERSION N/A 03/31/2016   Procedure: CARDIOVERSION;  Surgeon: Jerline Pain, MD;  Location: [facility] ENDOSCOPY;  Service: Cardiovascular: Successful cardioversion from A. fib to sinus rhythm  . CATARACT EXTRACTION  05/03/09  . CHOLECYSTECTOMY    . HEMORRHOID SURGERY    . Remote hysterectomy    . TEE WITHOUT CARDIOVERSION N/A 03/31/2016   Procedure: TRANSESOPHAGEAL ECHOCARDIOGRAM (TEE);  Surgeon: Jerline Pain, MD;  Location: [place];  Service: Cardiovascular:  EF 55-60%. No vegetation or thrombus okay for DC CV  . TONSILLECTOMY    . TOTAL KNEE ARTHROPLASTY Right 08/26/10  By Dr. Thana Farr. Markea Ruzich. Cemented, computer assist  . TRANSTHORACIC ECHOCARDIOGRAM  03/2016    Mild LVH. EF 65-70%. High LVEDP no RWMA. Severe LA dilation. Peak PAP ~ 37 mmHg   Social History   Occupational History    Employer: RETIRED   Tobacco Use  . Smoking status: Never Smoker  . Smokeless tobacco: Never Used  Substance and Sexual Activity  . Alcohol use: No  . Drug use: No  . Sexual activity: Not on file

## 2018-01-09 ENCOUNTER — Telehealth: Payer: Self-pay | Admitting: *Deleted

## 2018-01-09 NOTE — Telephone Encounter (Signed)
   [facility] Pre-operative Risk Assessment    Request for surgical clearance:  1. What type of surgery is being performed? LEFT TOTAL KNEE ARTHROPLASTY  2. When is this surgery scheduled? TBD  3. What type of clearance is required (medical clearance vs. Pharmacy clearance to hold med vs. Both)? MEDICAL  4. Are there any medications that need to be held prior to surgery and how long? N/A  5. Practice name and name of physician performing surgery?  [facility]   6. What is your office phone number [phone]   7.   What is your office fax number [phone]  8.   Anesthesia type (None, local, MAC, general) ?  CHOICE   Devra Dopp 01/09/2018, 10:12 AM  _________________________________________________________________   (provider comments below)

## 2018-01-10 NOTE — Telephone Encounter (Signed)
Attempted to call both patient and son. Busy signal.

## 2018-01-15 ENCOUNTER — Ambulatory Visit (INDEPENDENT_AMBULATORY_CARE_PROVIDER_SITE_OTHER): Payer: Medicare HMO | Admitting: Orthopaedic Surgery

## 2018-01-15 NOTE — Telephone Encounter (Signed)
Spoke with pt re: surgical clearance. She has been scheduled to see Dr. Ellyn Hack, 01/17/18 @ 2:20.

## 2018-01-15 NOTE — Telephone Encounter (Signed)
   Primary Cardiologist:Sara Ellyn Hack, MD  Chart reviewed as part of pre-operative protocol coverage. Because of Sara [name]'s past medical history and time since last visit, he/she will require a follow-up visit in order to better assess preoperative cardiovascular risk.  Due to her age, a fib and chronic diastolic HF.    Pre-op covering staff: - Please schedule appointment and call patient to inform them. - Please contact requesting surgeon's office via preferred method (i.e, phone, fax) to inform them of need for appointment prior to surgery.  Sara Kicks, NP  01/15/2018, 2:48 PM

## 2018-01-17 ENCOUNTER — Ambulatory Visit: Payer: Medicare HMO | Admitting: Cardiology

## 2018-01-17 ENCOUNTER — Encounter: Payer: Self-pay | Admitting: Cardiology

## 2018-01-17 VITALS — BP 141/83 | HR 103

## 2018-01-17 DIAGNOSIS — I5032 Chronic diastolic (congestive) heart failure: Secondary | ICD-10-CM | POA: Diagnosis not present

## 2018-01-17 DIAGNOSIS — I481 Persistent atrial fibrillation: Secondary | ICD-10-CM | POA: Diagnosis not present

## 2018-01-17 DIAGNOSIS — Z01810 Encounter for preprocedural cardiovascular examination: Secondary | ICD-10-CM | POA: Diagnosis not present

## 2018-01-17 DIAGNOSIS — I10 Essential (primary) hypertension: Secondary | ICD-10-CM

## 2018-01-17 DIAGNOSIS — R55 Syncope and collapse: Secondary | ICD-10-CM | POA: Diagnosis not present

## 2018-01-17 DIAGNOSIS — R0989 Other specified symptoms and signs involving the circulatory and respiratory systems: Secondary | ICD-10-CM

## 2018-01-17 DIAGNOSIS — I4819 Other persistent atrial fibrillation: Secondary | ICD-10-CM

## 2018-01-17 MED ORDER — DIGOXIN 125 MCG PO TABS: ORAL_TABLET | ORAL | 3 refills | Status: DC

## 2018-01-17 NOTE — Patient Instructions (Signed)
Medication Instructions:   START DIGOXIN 0.125 MG 2 TABLETS THE FIRST DOSE THEN 1 TABLET ONCE DAILY WHEN WE CALL WITH LAB RESULTS  Labwork:  Your physician recommends that you HAVE LAB WORK TODAY  Testing/Procedures:  Your physician has requested that you have a lower extremity arterial duplex. This test is an ultrasound of the arteries in the legs or arms. It looks at arterial blood flow in the legs and arms. Allow one hour for Lower and Upper Arterial scans. There are no restrictions or special instructions   Follow-Up:  Your physician wants you to follow-up in: [place] will receive a reminder letter in the mail two months in advance. If you don't receive a letter, please call our office to schedule the follow-up appointment.   If you need a refill on your cardiac medications before your next appointment, please call your pharmacy.

## 2018-01-17 NOTE — Progress Notes (Signed)
PCP: Townsend Roger, MD  Clinic Note: Chief Complaint  Patient presents with  . Medical Clearance    Knee surgery  . Atrial Fibrillation    Asymptomatic    HPI: Sara [name] is a 84 y.o. female with a PMH below who presents today for 78-month follow-up for atrial fibrillation and chronic diastolic heart failure. -  She has been very reluctant to undergo treatment for her A. fib such as cardioversion. She also mentioned she did not tolerate several medications. His been very difficult to control.  Sara [name] was last seen by Dr. Curt Bears on July 16 -- was converted from Metoprolol/Diltiazem to Coreg (she felt that the meds were causing edema). I then saw her in follow-up on March 08, 2017: She had multiple complaints including swelling in her hands and feet poor sleep and dry eyes.  Otherwise no sensation of A. fib.  Intermittent chest twinges but no significant symptoms.  UNABLE TO INCREASE COREG TO 18.75 BID  Recent Hospitalizations: none  Studies Personally Reviewed - (if available, images/films reviewed: From Epic Chart or Care Everywhere)  nothing since October 2017   Interval History: Sara [name] returns today noting that her left knee is basically locked and she is unable to use it.  She is currently wheelchair-bound not able to walk and is hoping to have surgery done by Dr. Lorin Mercy from [facility].  This happened about a week or so ago when she now has not been able to do much of any kind of activity.  Leading up to it however she is doing pretty well.  She was not really noticed whether she was or not in A. fib.  She has not had any significant episodes of syncope or near syncope type symptoms with either dizziness or wooziness.  She does have poor balance.  She is still does not have very good sleep habits, but denies any PND orthopnea.  No TIA or amaurosis fugax symptoms.  Her edema seems to be pretty well controlled with Lasix and she has not necessarily use any  additional doses since increasing her standing dose. She denies any significant episode of chest tightness or pressure, just had a couple episodes of GERD and abdominal gas.  She is pretty sure that this was GERD because it was relieved with belching.  Otherwise no chest pain.   ROS: A comprehensive was performed. Review of Systems  Constitutional: Negative for malaise/fatigue.  HENT: Negative for congestion.   Eyes:       Episode about a week ago where she is already green lights in her eyes when trying to sleep.  Respiratory: Negative for shortness of breath.   Cardiovascular: Positive for leg swelling (Controlled with Lasix and elevating her feet).  Gastrointestinal: Negative for blood in stool, diarrhea (She has had some loose stools) and melena.       She has noted dark stools  Genitourinary: Negative for hematuria.  Musculoskeletal: Positive for back pain and joint pain (Mostly left knee). Negative for falls.  Neurological: Positive for dizziness (Positional).  Endo/Heme/Allergies: Bruises/bleeds easily.  Psychiatric/Behavioral: The patient has insomnia (Poor sleep. Has a hard time getting sleep, then wakes up and has a hard time going back to sleep).        Relatively poor historian.  All other systems reviewed and are negative.   I have reviewed and (if needed) personally updated the patient's problem list, medications, allergies, past medical and surgical history, social and family history.   Past  Medical History:  Diagnosis Date  . Ankle edema    Mild, which is intermittent, usually mildly dependent, left slightly greater than the right.  . Anxiety   . Arthritis    In her knees  . Balance problem    Due to weakened knee  . Chronic diastolic heart failure (HCC)    Class I-II -- exacerbated by Afib RVR  . Corneal edema    Severe corneal edema in right eye with multiple folds. Developed after cataract surgery 05/03/09  . GERD (gastroesophageal reflux disease)   .  Hematuria    Resolved after coumadin was stopped  . Hiatal hernia    With GERD  . Hyperglycemia    Random, without any history of diabetes  . Hyperlipidemia   . Hypertension    Difficult to control. Renal Doppler 06/02/10 showed less that 60% bilateral renal artery narrowing.  . Hypokalemia   . Permanent atrial fibrillation (HCC)    Rate control.  ASA.  NOT on DOAC or warfarin due to concern for falls &bleeding.  Marland Kitchen Posthemorrhagic anemia   . Pseudophakia   . Syncope 02/22/11   During OV on 02/22/11 her INR was 7.3 and was given 2.5 mg of vitamin K. Later that day she became diaphoretic & fainted.  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 BREAST BIOPSY    . CARDIOVERSION N/A 03/31/2016   Procedure: CARDIOVERSION;  Surgeon: Jerline Pain, MD;  Location: [facility] ENDOSCOPY;  Service: Cardiovascular: Successful cardioversion from A. fib to sinus rhythm  . CATARACT EXTRACTION  05/03/09  . CHOLECYSTECTOMY    . HEMORRHOID SURGERY    . Remote hysterectomy    . TEE WITHOUT CARDIOVERSION N/A 03/31/2016   Procedure: TRANSESOPHAGEAL ECHOCARDIOGRAM (TEE);  Surgeon: Jerline Pain, MD;  Location: [facility];  Service: Cardiovascular:  EF 55-60%. No vegetation or thrombus okay for DC CV  . TONSILLECTOMY    . TOTAL KNEE ARTHROPLASTY Right 08/26/10   By Dr. Elta Guadeloupe [facility]. Cemented, computer assist  . TRANSTHORACIC ECHOCARDIOGRAM  03/2016    Mild LVH. EF 65-70%. High LVEDP no RWMA. Severe LA dilation. Peak PAP ~ 37 mmHg   Current Outpatient Medications on File Prior to Visit  Medication Sig Dispense Refill  . aspirin EC 81 MG tablet Take 81 mg by mouth daily.    . carvedilol (COREG) 12.5 MG tablet Take 12.5 mg in the morning and 18.75 mg in the evening. 270 tablet 3  . furosemide (LASIX) 40 MG tablet TAKE ONE TABLET BY MOUTH TWICE DAILY 60 tablet 6  . potassium chloride SA (K-DUR,KLOR-CON) 20 MEQ tablet Take 1 tablet (20 mEq total) by mouth 2  (two) times daily. Take extra dose if use extra tablet of furosemide 90 tablet 6  . potassium chloride SA (K-DUR,KLOR-CON) 20 MEQ tablet TAKE ONE TABLET BY MOUTH TWICE DAILY 60 tablet 8  . rOPINIRole (REQUIP) 1 MG tablet Take 1 tablet by mouth at bedtime.   3   No current facility-administered medications on file prior to visit.     Allergies  Allergen Reactions  . Amiodarone Shortness Of Breath    Stopped in Feb 2017 secondary to pulmonary complications  . Aliskiren Other (See Comments) and Nausea Only    unknown  . Amlodipine Besylate-Valsartan Other (See Comments) and Nausea Only    Unclear  . Bystolic [Nebivolol Hcl] Other (See Comments)    unknown  . Clonidine Derivatives  Other (See Comments)    unknown  . Dronedarone Nausea And Vomiting and Nausea Only  . Hctz [Hydrochlorothiazide]     Currently taking without problem; question if this has to do with hypokalemia  . Hydralazine Other (See Comments)    Also tolerated, but had orthostatic changes on standing medication  . Lisinopril Other (See Comments) and Nausea Only    unknown  . Nebivolol Nausea Only  . Olmesartan Nausea Only    Unclear of the intolerance  . Rythmol [Propafenone] Other (See Comments)    unknown  . Statins Other (See Comments) and Nausea Only    unknown  . Carvedilol Other (See Comments) and Nausea And Vomiting    fatigue  . Prednisone Nausea And Vomiting    Jittery     Social History   Tobacco Use  . Smoking status: Never Smoker  . Smokeless tobacco: Never Used  Substance Use Topics  . Alcohol use: No  . Drug use: No   Social History   Social History Narrative   She is a widowed mother of one and grandmother of one.  She does not exercise.  She does not smoke and does not drink alcohol.     family history includes Cancer in her mother; Coronary artery disease in her brother; Diabetes type II in her brother; Heart attack in her sister; Hypertension in her brother, mother, and sister.  Wt  Readings from Last 3 Encounters:  01/08/18 145 lb (65.8 kg)  11/16/17 144 lb (65.3 kg)  08/31/17 147 lb (66.7 kg)    PHYSICAL EXAM BP (!) 141/83   Pulse (!) 103  Physical Exam  Constitutional: She is oriented to person, place, and time. She appears well-developed and well-nourished. No distress.  Well-groomed.  HENT:  Head: Normocephalic and atraumatic.  Neck: Normal range of motion. Neck supple. No hepatojugular reflux and no JVD present. Carotid bruit is not present.  Cardiovascular: Normal heart sounds. An irregularly irregular rhythm present. Tachycardia present. PMI is not displaced. Exam reveals decreased pulses (Difficult to palpate pedal pulse mild edema and A. fib.). Exam reveals no gallop.  No murmur heard. Physiologically split S2  Pulmonary/Chest: Effort normal and breath sounds normal. No respiratory distress. She has no wheezes.  Abdominal: Soft. Bowel sounds are normal. She exhibits no distension. There is no tenderness. There is no rebound.  Musculoskeletal: Normal range of motion. She exhibits edema (1+ left lower extremity and trace right lower extremity edema).  Neurological: She is alert and oriented to person, place, and time.  Psychiatric: She has a normal mood and affect. Her behavior is normal.  Notable poor short-term memory --  Somewhat repetitious    Adult ECG Report  Rate: 103;  Rhythm: atrial fibrillation; with RVR.  PVCs noted.  RBBB.  T wave inversions, cannot exclude ischemia.   Narrative Interpretation: Stable  Other studies Reviewed: Additional studies/ records that were reviewed today include:  Recent Labs:  No results found for: CHOL, HDL, LDLCALC, LDLDIRECT, TRIG, CHOLHDL - followed by PCP.No labs available  ASSESSMENT / PLAN: Problem List Items Addressed This Visit    Atypical syncope (Chronic)    No further episodes since June 2017      Relevant Medications   digoxin (LANOXIN) 0.125 MG tablet   Chronic diastolic CHF (congestive heart  failure), NYHA class 1 (HCC) (Chronic)    Class I to maybe 2 symptoms with minimal edema at this point.  No real PND orthopnea.  Certainly if her rate goes faster,  she is more symptomatic.  Need to be careful about avoiding volume overloaded during surgery.  For now continue current dose of furosemide with ability to take additional dose PRN.      Relevant Medications   digoxin (LANOXIN) 0.125 MG tablet   Essential hypertension (Chronic)    Still borderline at this point.  Did not tolerate higher dose of carvedilol because she felt really dizzy.  At this point I think mild permissive hypertension is probably the best option.      Relevant Medications   digoxin (LANOXIN) 0.125 MG tablet   Persistent atrial fibrillation (HCC) - Primary (Chronic)    Pretty much now permanent persistent atrial fibrillation.  Currently her rate is little high, but she is noticing pain from her left leg.  I would be leery of pushing her rate control  because she did not tolerate a higher dose of carvedilol.  To avoid affecting blood pressure but allow for better rate control, will add low-dose digoxin which she will take 0.25 mg daily for the first day then 0.125 mg daily after that -provided she has normal renal function levels.  Will check renal function today.      Relevant Medications   digoxin (LANOXIN) 0.125 MG tablet   Other Relevant Orders   EKG 12-Lead (Completed)   Basic metabolic panel (Completed)   Magnesium (Completed)   Preop cardiovascular exam    Kyliee heart failure symptoms with well-controlled edema and normal EF.  She does not have CAD.  She does not have diabetes, and has relatively normal renal function.  No prior stroke history.  Knee surgery is low risk.  Her major risk factor for surgery at her age, but if this was done without general anesthesia, would likely be low risk.  We have been asked to check lower extremity arterial Dopplers to ensure that she has adequate perfusion to heal  the knee.  I do feel that her pulses are decreased in the feet but probably more related to her being in A. fib.  We will check lower extremity arterial Dopplers  to confirm adequate perfusion as part of preop evaluation.       Other Visit Diagnoses    Decreased pulse       Relevant Orders   VAS [place] ABI WITH/WO TBI   VAS [place] LOWER EXTREMITY ARTERIAL DUPLEX     Difficult historian, and difficult to explain things to her.    Current medicines are reviewed at length with the patient today. (+/- concerns) none The following changes have been made:  Patient Instructions  Medication Instructions:   START DIGOXIN 0.125 MG 2 TABLETS THE FIRST DOSE THEN 1 TABLET ONCE DAILY WHEN WE CALL WITH LAB RESULTS  Labwork:  Your physician recommends that you HAVE LAB WORK TODAY  Testing/Procedures:  Your physician has requested that you have a lower extremity arterial duplex. This test is an ultrasound of the arteries in the legs or arms. It looks at arterial blood flow in the legs and arms. Allow one hour for Lower and Upper Arterial scans. There are no restrictions or special instructions   Follow-Up:  Your physician wants you to follow-up in: [place] will receive a reminder letter in the mail two months in advance. If you don't receive a letter, please call our office to schedule the follow-up appointment.   If you need a refill on your cardiac medications before your next appointment, please call your pharmacy.  Studies Ordered:   Orders Placed This Encounter  Procedures  . Basic metabolic panel  . Magnesium  . EKG 12-Lead      Glenetta Hew, M.D., M.S. Interventional Cardiologist   Pager # [phone] Phone # [phone] [address] [place], [place] [address]

## 2018-01-18 LAB — BASIC METABOLIC PANEL
BUN/Creatinine Ratio: 29 — ABNORMAL HIGH (ref 12–28)
BUN: 20 mg/dL (ref 8–27)
CO2: 24 mmol/L (ref 20–29)
Calcium: 9.7 mg/dL (ref 8.7–10.3)
Chloride: 105 mmol/L (ref 96–106)
Creatinine, Ser: 0.69 mg/dL (ref 0.57–1.00)
GFR calc Af Amer: 89 mL/min/{1.73_m2} (ref >59)
GFR calc non Af Amer: 77 mL/min/{1.73_m2} (ref >59)
Glucose: 92 mg/dL (ref 65–99)
Potassium: 4.6 mmol/L (ref 3.5–5.2)
Sodium: 146 mmol/L — ABNORMAL HIGH (ref 134–144)

## 2018-01-18 LAB — MAGNESIUM: Magnesium: 2.4 mg/dL — ABNORMAL HIGH (ref 1.6–2.3)

## 2018-01-19 ENCOUNTER — Encounter: Payer: Self-pay | Admitting: Cardiology

## 2018-01-19 DIAGNOSIS — Z01810 Encounter for preprocedural cardiovascular examination: Secondary | ICD-10-CM | POA: Insufficient documentation

## 2018-01-19 NOTE — Assessment & Plan Note (Signed)
Still borderline at this point.  Did not tolerate higher dose of carvedilol because she felt really dizzy.  At this point I think mild permissive hypertension is probably the best option.

## 2018-01-19 NOTE — Assessment & Plan Note (Signed)
Class I to maybe 2 symptoms with minimal edema at this point.  No real PND orthopnea.  Certainly if her rate goes faster, she is more symptomatic.  Need to be careful about avoiding volume overloaded during surgery.  For now continue current dose of furosemide with ability to take additional dose PRN.

## 2018-01-19 NOTE — Assessment & Plan Note (Addendum)
Pretty much now permanent persistent atrial fibrillation.  Currently her rate is little high, but she is noticing pain from her left leg.  I would be leery of pushing her rate control  because she did not tolerate a higher dose of carvedilol.  To avoid affecting blood pressure but allow for better rate control, will add low-dose digoxin which she will take 0.25 mg daily for the first day then 0.125 mg daily after that -provided she has normal renal function levels.  Will check renal function today.

## 2018-01-19 NOTE — Assessment & Plan Note (Signed)
Sara [name] heart failure symptoms with well-controlled edema and normal EF.  She does not have CAD.  She does not have diabetes, and has relatively normal renal function.  No prior stroke history.  Knee surgery is low risk.  Her major risk factor for surgery at her age, but if this was done without general anesthesia, would likely be low risk.  We have been asked to check lower extremity arterial Dopplers to ensure that she has adequate perfusion to heal the knee.  I do feel that her pulses are decreased in the feet but probably more related to her being in A. fib.  We will check lower extremity arterial Dopplers  to confirm adequate perfusion as part of preop evaluation.

## 2018-01-19 NOTE — Assessment & Plan Note (Signed)
No further episodes since June 2017

## 2018-01-23 ENCOUNTER — Ambulatory Visit (HOSPITAL_COMMUNITY)
Admission: RE | Admit: 2018-01-23 | Payer: Medicare HMO | Source: Ambulatory Visit | Attending: Cardiology | Admitting: Cardiology

## 2018-01-24 ENCOUNTER — Telehealth: Payer: Self-pay | Admitting: *Deleted

## 2018-01-24 ENCOUNTER — Other Ambulatory Visit: Payer: Self-pay | Admitting: Cardiology

## 2018-01-24 DIAGNOSIS — I739 Peripheral vascular disease, unspecified: Secondary | ICD-10-CM

## 2018-01-24 DIAGNOSIS — I4819 Other persistent atrial fibrillation: Secondary | ICD-10-CM

## 2018-01-24 NOTE — Telephone Encounter (Signed)
-----   Message from Leonie Man, MD sent at 01/19/2018 11:31 AM EDT ----- Regarding: Labs reviewed - OK to start Digoxin as directed Ivin Booty - (welcome back).  We can let Mrs. Reisch know that she can start her Digoxin. Glenetta Hew, MD

## 2018-01-28 ENCOUNTER — Other Ambulatory Visit: Payer: Self-pay | Admitting: Cardiology

## 2018-01-28 DIAGNOSIS — R0989 Other specified symptoms and signs involving the circulatory and respiratory systems: Secondary | ICD-10-CM

## 2018-01-29 ENCOUNTER — Ambulatory Visit (HOSPITAL_COMMUNITY)
Admission: RE | Admit: 2018-01-29 | Discharge: 2018-01-29 | Disposition: A | Discharge status: Home / Self Care | Payer: Medicare HMO | Source: Ambulatory Visit | Attending: Cardiology | Admitting: Cardiology

## 2018-01-29 DIAGNOSIS — R0989 Other specified symptoms and signs involving the circulatory and respiratory systems: Secondary | ICD-10-CM | POA: Diagnosis not present

## 2018-02-04 MED ORDER — DIGOXIN 125 MCG PO TABS: ORAL_TABLET | ORAL | 3 refills | Status: DC

## 2018-02-04 NOTE — Telephone Encounter (Signed)
Result given for digoxin level,and lower extremities doppler

## 2018-02-04 NOTE — Telephone Encounter (Signed)
Follow Up:     Pt calling for her lab results, so she can find out about taking her Digoxin.

## 2018-02-12 ENCOUNTER — Telehealth: Payer: Self-pay | Admitting: Cardiology

## 2018-02-12 NOTE — Telephone Encounter (Signed)
Spoke with pt. Adv her that I will fwd the message to Raymond and his nurse to f/u with her regarding his recommendation in reference to her recent PV study.  Pt verbalized understanding and voiced appreciation

## 2018-02-12 NOTE — Telephone Encounter (Signed)
New Message  Pt states that has been expecting a call from the nurse about  The blockage in her legs. Please call

## 2018-02-13 NOTE — Telephone Encounter (Signed)
I apologize - this must have slipped through the cracks -- Dr. Percival Spanish reviewed results, but was not sure what to do re: surgical question.  I actually have asked Dr. Gwenlyn Found to review the study b/c the planned surgery is L Knee Replacement & her Left SFA appears to be occluded.  This may indeed be an issue.  Once I hear from him, we can call her & let her PCP know as well.  Glenetta Hew, MD

## 2018-02-14 ENCOUNTER — Telehealth (INDEPENDENT_AMBULATORY_CARE_PROVIDER_SITE_OTHER): Payer: Self-pay | Admitting: Orthopaedic Surgery

## 2018-02-14 NOTE — Telephone Encounter (Signed)
FYI:   Following up on patient's clearance for L-TKA I found a note with today's date from Cardiologist.  Dr Gwenlyn Found states the patient should not have knee replacement with an occluded superficial femoral artery.

## 2018-02-14 NOTE — Telephone Encounter (Signed)
Next question: Dr. Gwenlyn Found - should she possible have PV intervention on the SFA to allow for healing?  If, so - would you like to see her.  For now, we should have her delay knee surgery.  Glenetta Hew, MD

## 2018-02-14 NOTE — Telephone Encounter (Signed)
I reviewed the Dopplers.  I do not think the patient should have a left total knee replacement with an occluded left SFA.

## 2018-02-15 NOTE — Telephone Encounter (Signed)
Noted, no surgery for now.

## 2018-02-15 NOTE — Telephone Encounter (Signed)
FYI

## 2018-02-18 NOTE — Telephone Encounter (Signed)
So we should have her come in and see Dr. Gwenlyn Found for evaluation, but we recommend that she have her knee surgery on hold.  Glenetta Hew, MD

## 2018-02-18 NOTE — Telephone Encounter (Signed)
I would be happy to see her in the office to evaluate.

## 2018-02-19 NOTE — Telephone Encounter (Signed)
Spoke with pt, Follow up scheduled with dr berry.

## 2018-02-22 DIAGNOSIS — R202 Paresthesia of skin: Secondary | ICD-10-CM | POA: Diagnosis not present

## 2018-02-22 DIAGNOSIS — R200 Anesthesia of skin: Secondary | ICD-10-CM | POA: Diagnosis not present

## 2018-02-22 DIAGNOSIS — I739 Peripheral vascular disease, unspecified: Secondary | ICD-10-CM | POA: Diagnosis not present

## 2018-02-27 ENCOUNTER — Ambulatory Visit: Payer: Medicare HMO | Admitting: Cardiovascular Disease

## 2018-02-27 ENCOUNTER — Encounter: Payer: Self-pay | Admitting: Cardiovascular Disease

## 2018-02-27 VITALS — BP 160/82 | HR 83 | Ht 60.0 in

## 2018-02-27 DIAGNOSIS — Z01812 Encounter for preprocedural laboratory examination: Secondary | ICD-10-CM | POA: Diagnosis not present

## 2018-02-27 DIAGNOSIS — I739 Peripheral vascular disease, unspecified: Secondary | ICD-10-CM

## 2018-02-27 DIAGNOSIS — I481 Persistent atrial fibrillation: Secondary | ICD-10-CM | POA: Diagnosis not present

## 2018-02-27 NOTE — H&P (View-Only) (Signed)
02/27/2018 Sara [name]   [birth year]  [id number]  Primary Physician Townsend Roger, MD Primary Cardiologist: Lorretta Harp MD [name], [place]  HPI:  Sara [name] is a 84 y.o. moderately overweight widowed Caucasian female mother of 23, grandmother one grandchild who is accompanied by her Sister Sara [name] who is a patient of mine as well.  She was referred by Dr. Glenetta Hew for peripheral vascular evaluation because of an abnormal lower extremity arterial Doppler study performed in preoperative evaluation prior to elective left total knee replacement by Dr. Inda Merlin.  She has a history of hypertension and chronic atrial fibrillation.  She has been wheelchair-bound for 2 months because of pain in her left knee.  Dopplers performed 01/29/2018 revealed a right ABI 0.76 and left 0.57 with an occluded left distal SFA and popliteal artery.  She did complain of some claudication prior to this.  There is no evidence of critical limb ischemia.   Current Meds  Medication Sig  . aspirin EC 81 MG tablet Take 81 mg by mouth daily.  . carvedilol (COREG) 12.5 MG tablet Take 12.5 mg in the morning and 18.75 mg in the evening.  . digoxin (LANOXIN) 0.125 MG tablet TAKE 2 TABLETS FOR THE FIRST DOSE THEN ONE TABLET ONCE DAILY  . furosemide (LASIX) 40 MG tablet TAKE ONE TABLET BY MOUTH TWICE DAILY  . potassium chloride SA (K-DUR,KLOR-CON) 20 MEQ tablet Take 1 tablet (20 mEq total) by mouth 2 (two) times daily. Take extra dose if use extra tablet of furosemide  . potassium chloride SA (K-DUR,KLOR-CON) 20 MEQ tablet TAKE ONE TABLET BY MOUTH TWICE DAILY  . rOPINIRole (REQUIP) 1 MG tablet Take 1 tablet by mouth at bedtime.      Allergies  Allergen Reactions  . Amiodarone Shortness Of Breath    Stopped in Feb 2017 secondary to pulmonary complications  . Aliskiren Other (See Comments) and Nausea Only    unknown  . Amlodipine Besylate-Valsartan Other (See Comments) and Nausea Only   Unclear  . Bystolic [Nebivolol Hcl] Other (See Comments)    unknown  . Clonidine Derivatives Other (See Comments)    unknown  . Dronedarone Nausea And Vomiting and Nausea Only  . Hctz [Hydrochlorothiazide]     Currently taking without problem; question if this has to do with hypokalemia  . Hydralazine Other (See Comments)    Also tolerated, but had orthostatic changes on standing medication  . Lisinopril Other (See Comments) and Nausea Only    unknown  . Nebivolol Nausea Only  . Olmesartan Nausea Only    Unclear of the intolerance  . Rythmol [Propafenone] Other (See Comments)    unknown  . Statins Other (See Comments) and Nausea Only    unknown  . Carvedilol Other (See Comments) and Nausea And Vomiting    fatigue  . Prednisone Nausea And Vomiting    Jittery     Social History   Socioeconomic History  . Marital status: Widowed    Spouse name: Not on file  . Number of children: 1  . Years of education: Not on file  . Highest education level: Not on file  Occupational History    Employer: RETIRED   Social Needs  . Financial resource strain: Not on file  . Food insecurity:    Worry: Not on file    Inability: Not on file  . Transportation needs:    Medical: Not on file    Non-medical: Not on file  Tobacco Use  . Smoking status: Never Smoker  . Smokeless tobacco: Never Used  Substance and Sexual Activity  . Alcohol use: No  . Drug use: No  . Sexual activity: Not on file  Lifestyle  . Physical activity:    Days per week: Not on file    Minutes per session: Not on file  . Stress: Not on file  Relationships  . Social connections:    Talks on phone: Not on file    Gets together: Not on file    Attends religious service: Not on file    Active member of club or organization: Not on file    Attends meetings of clubs or organizations: Not on file    Relationship status: Not on file  . Intimate partner violence:    Fear of current or ex partner: Not on file     Emotionally abused: Not on file    Physically abused: Not on file    Forced sexual activity: Not on file  Other Topics Concern  . Not on file  Social History Narrative   She is a widowed mother of one and grandmother of one.  She does not exercise.  She does not smoke and does not drink alcohol.     Review of Systems: General: negative for chills, fever, night sweats or weight changes.  Cardiovascular: negative for chest pain, dyspnea on exertion, edema, orthopnea, palpitations, paroxysmal nocturnal dyspnea or shortness of breath Dermatological: negative for rash Respiratory: negative for cough or wheezing Urologic: negative for hematuria Abdominal: negative for nausea, vomiting, diarrhea, bright red blood per rectum, melena, or hematemesis Neurologic: negative for visual changes, syncope, or dizziness All other systems reviewed and are otherwise negative except as noted above.    Blood pressure (!) 160/82, pulse 83, height 5' (1.524 m).  General appearance: alert and no distress Neck: no adenopathy, no carotid bruit, no JVD, supple, symmetrical, trachea midline and thyroid not enlarged, symmetric, no tenderness/mass/nodules Lungs: clear to auscultation bilaterally Heart: irregularly irregular rhythm Extremities: extremities normal, atraumatic, no cyanosis or edema Pulses: Absent left pedal pulse Skin: Skin color, texture, turgor normal. No rashes or lesions Neurologic: Alert and oriented X 3, normal strength and tone. Normal symmetric reflexes. Normal coordination and gait  EKG not performed today  ASSESSMENT AND PLAN:   Peripheral arterial disease ([place]) Ms. Croucher was referred to me by Dr. Ellyn Hack for peripheral arterial evaluation prior to elective left total knee replacement by Dr. Inda Merlin.  She had lower extremity arterial Doppler studies performed 01/29/2018 revealing a right ABI 0.76 and a left of 0.5 2.  She had an occluded distal left SFA and popliteal artery.  I did not think  she should undergo her orthopedic surgeon surgical procedure with her diminished ABI and poor blood flow.  She is symptomatic and wheelchair-bound for the last 2 months and wishes to proceed with angiography potential endovascular therapy to improve blood flow and potentially allow her to have her total knee replacement.      Lorretta Harp MD FACP,FACC,FAHA, [facility] 02/27/2018 1:35 PM

## 2018-02-27 NOTE — Assessment & Plan Note (Signed)
Sara [name] was referred to me by Dr. Ellyn Hack for peripheral arterial evaluation prior to elective left total knee replacement by Dr. Inda Merlin.  She had lower extremity arterial Doppler studies performed 01/29/2018 revealing a right ABI 0.76 and a left of 0.5 2.  She had an occluded distal left SFA and popliteal artery.  I did not think she should undergo her orthopedic surgeon surgical procedure with her diminished ABI and poor blood flow.  She is symptomatic and wheelchair-bound for the last 2 months and wishes to proceed with angiography potential endovascular therapy to improve blood flow and potentially allow her to have her total knee replacement.

## 2018-02-27 NOTE — Progress Notes (Signed)
02/27/2018 Sara [name]   24-Jun-1925  [id number]  Primary Physician Sara Roger, MD Primary Cardiologist: Sara Harp MD Sara [name], [place]  HPI:  Sara [name] is a 84 y.o. moderately overweight widowed Caucasian female mother of 84, grandmother one grandchild who is accompanied by her Sister Sara [name] who is a patient of mine as well.  She was referred by Dr. Glenetta [name] for peripheral vascular evaluation because of an abnormal lower extremity arterial Doppler study performed in preoperative evaluation prior to elective left total knee replacement by Dr. Inda [name].  She has a history of hypertension and chronic atrial fibrillation.  She has been wheelchair-bound for 2 months because of pain in her left knee.  Dopplers performed 01/29/2018 revealed a right ABI 0.76 and left 0.57 with an occluded left distal SFA and popliteal artery.  She did complain of some claudication prior to this.  There is no evidence of critical limb ischemia.   Current Meds  Medication Sig  . aspirin EC 81 MG tablet Take 81 mg by mouth daily.  . carvedilol (COREG) 12.5 MG tablet Take 12.5 mg in the morning and 18.75 mg in the evening.  . digoxin (LANOXIN) 0.125 MG tablet TAKE 2 TABLETS FOR THE FIRST DOSE THEN ONE TABLET ONCE DAILY  . furosemide (LASIX) 40 MG tablet TAKE ONE TABLET BY MOUTH TWICE DAILY  . potassium chloride SA (K-DUR,KLOR-CON) 20 MEQ tablet Take 1 tablet (20 mEq total) by mouth 2 (two) times daily. Take extra dose if use extra tablet of furosemide  . potassium chloride SA (K-DUR,KLOR-CON) 20 MEQ tablet TAKE ONE TABLET BY MOUTH TWICE DAILY  . rOPINIRole (REQUIP) 1 MG tablet Take 1 tablet by mouth at bedtime.      Allergies  Allergen Reactions  . Amiodarone Shortness Of Breath    Stopped in Feb 2017 secondary to pulmonary complications  . Aliskiren Other (See Comments) and Nausea Only    unknown  . Amlodipine Besylate-Valsartan Other (See Comments) and Nausea Only   Unclear  . Bystolic [Nebivolol Hcl] Other (See Comments)    unknown  . Clonidine Derivatives Other (See Comments)    unknown  . Dronedarone Nausea And Vomiting and Nausea Only  . Hctz [Hydrochlorothiazide]     Currently taking without problem; question if this has to do with hypokalemia  . Hydralazine Other (See Comments)    Also tolerated, but had orthostatic changes on standing medication  . Lisinopril Other (See Comments) and Nausea Only    unknown  . Nebivolol Nausea Only  . Olmesartan Nausea Only    Unclear of the intolerance  . Rythmol [Propafenone] Other (See Comments)    unknown  . Statins Other (See Comments) and Nausea Only    unknown  . Carvedilol Other (See Comments) and Nausea And Vomiting    fatigue  . Prednisone Nausea And Vomiting    Jittery     Social History   Socioeconomic History  . Marital status: Widowed    Spouse name: Not on file  . Number of children: 1  . Years of education: Not on file  . Highest education level: Not on file  Occupational History    Employer: RETIRED   Social Needs  . Financial resource strain: Not on file  . Food insecurity:    Worry: Not on file    Inability: Not on file  . Transportation needs:    Medical: Not on file    Non-medical: Not on file  Tobacco Use  . Smoking status: Never Smoker  . Smokeless tobacco: Never Used  Substance and Sexual Activity  . Alcohol use: No  . Drug use: No  . Sexual activity: Not on file  Lifestyle  . Physical activity:    Days per week: Not on file    Minutes per session: Not on file  . Stress: Not on file  Relationships  . Social connections:    Talks on phone: Not on file    Gets together: Not on file    Attends religious service: Not on file    Active member of club or organization: Not on file    Attends meetings of clubs or organizations: Not on file    Relationship status: Not on file  . Intimate partner violence:    Fear of current or ex partner: Not on file     Emotionally abused: Not on file    Physically abused: Not on file    Forced sexual activity: Not on file  Other Topics Concern  . Not on file  Social History Narrative   She is a widowed mother of one and grandmother of one.  She does not exercise.  She does not smoke and does not drink alcohol.     Review of Systems: General: negative for chills, fever, night sweats or weight changes.  Cardiovascular: negative for chest pain, dyspnea on exertion, edema, orthopnea, palpitations, paroxysmal nocturnal dyspnea or shortness of breath Dermatological: negative for rash Respiratory: negative for cough or wheezing Urologic: negative for hematuria Abdominal: negative for nausea, vomiting, diarrhea, bright red blood per rectum, melena, or hematemesis Neurologic: negative for visual changes, syncope, or dizziness All other systems reviewed and are otherwise negative except as noted above.    Blood pressure (!) 160/82, pulse 83, height 5' (1.524 m).  General appearance: alert and no distress Neck: no adenopathy, no carotid bruit, no JVD, supple, symmetrical, trachea midline and thyroid not enlarged, symmetric, no tenderness/mass/nodules Lungs: clear to auscultation bilaterally Heart: irregularly irregular rhythm Extremities: extremities normal, atraumatic, no cyanosis or edema Pulses: Absent left pedal pulse Skin: Skin color, texture, turgor normal. No rashes or lesions Neurologic: Alert and oriented X 3, normal strength and tone. Normal symmetric reflexes. Normal coordination and gait  EKG not performed today  ASSESSMENT AND PLAN:   Peripheral arterial disease ([place]) Sara [name] was referred to me by Dr. Ellyn [name] for peripheral arterial evaluation prior to elective left total knee replacement by Dr. Inda [name].  She had lower extremity arterial Doppler studies performed 01/29/2018 revealing a right ABI 0.76 and a left of 0.5 2.  She had an occluded distal left SFA and popliteal artery.  I did not think  she should undergo her orthopedic surgeon surgical procedure with her diminished ABI and poor blood flow.  She is symptomatic and wheelchair-bound for the last 2 months and wishes to proceed with angiography potential endovascular therapy to improve blood flow and potentially allow her to have her total knee replacement.      Sara Harp MD FACP,FACC,FAHA, [facility] 02/27/2018 1:35 PM

## 2018-02-27 NOTE — Patient Instructions (Signed)
Medication Instructions:  Your physician recommends that you continue on your current medications as directed. Please refer to the Current Medication list given to you today.   Labwork: Your physician recommends that you return for lab work in: TODAY    Testing/Procedures: none  Follow-Up: Your physician recommends that you schedule a follow-up appointment pending your procedure on 03/18/2018  Any Other Special Instructions Will Be Listed Below (If Applicable).     If you need a refill on your cardiac medications before your next appointment, please call your pharmacy.

## 2018-02-28 LAB — CBC WITH DIFFERENTIAL/PLATELET
BASOS: 1 %
Basophils Absolute: 0.1 10*3/uL (ref 0.0–0.2)
EOS (ABSOLUTE): 0.3 10*3/uL (ref 0.0–0.4)
Eos: 4 %
Hematocrit: 45.9 % (ref 34.0–46.6)
Hemoglobin: 15 g/dL (ref 11.1–15.9)
IMMATURE GRANS (ABS): 0 10*3/uL (ref 0.0–0.1)
Immature Granulocytes: 0 %
LYMPHS ABS: 2.1 10*3/uL (ref 0.7–3.1)
Lymphs: 31 %
MCH: 29.1 pg (ref 26.6–33.0)
MCHC: 32.7 g/dL (ref 31.5–35.7)
MCV: 89 fL (ref 79–97)
MONOS ABS: 0.6 10*3/uL (ref 0.1–0.9)
Monocytes: 8 %
NEUTROS ABS: 3.8 10*3/uL (ref 1.4–7.0)
Neutrophils: 56 %
PLATELETS: 232 10*3/uL (ref 150–450)
RBC: 5.15 x10E6/uL (ref 3.77–5.28)
RDW: 13.6 % (ref 12.3–15.4)
WBC: 6.9 10*3/uL (ref 3.4–10.8)

## 2018-02-28 LAB — BASIC METABOLIC PANEL
BUN/Creatinine Ratio: 32 — ABNORMAL HIGH (ref 12–28)
BUN: 20 mg/dL (ref 8–27)
CALCIUM: 9.7 mg/dL (ref 8.7–10.3)
CHLORIDE: 105 mmol/L (ref 96–106)
CO2: 26 mmol/L (ref 20–29)
CREATININE: 0.62 mg/dL (ref 0.57–1.00)
GFR calc non Af Amer: 80 mL/min/{1.73_m2} (ref >59)
GFR, EST AFRICAN AMERICAN: 92 mL/min/{1.73_m2} (ref >59)
Glucose: 93 mg/dL (ref 65–99)
Potassium: 4.2 mmol/L (ref 3.5–5.2)
Sodium: 146 mmol/L — ABNORMAL HIGH (ref 134–144)

## 2018-02-28 LAB — TSH: TSH: 1.62 u[IU]/mL (ref 0.450–4.500)

## 2018-03-04 ENCOUNTER — Other Ambulatory Visit: Payer: Self-pay

## 2018-03-04 DIAGNOSIS — I739 Peripheral vascular disease, unspecified: Secondary | ICD-10-CM

## 2018-03-14 ENCOUNTER — Other Ambulatory Visit: Payer: Self-pay | Admitting: *Deleted

## 2018-03-14 ENCOUNTER — Other Ambulatory Visit (HOSPITAL_COMMUNITY): Payer: Self-pay | Admitting: Radiology

## 2018-03-14 DIAGNOSIS — I739 Peripheral vascular disease, unspecified: Secondary | ICD-10-CM

## 2018-03-18 ENCOUNTER — Other Ambulatory Visit: Payer: Self-pay

## 2018-03-18 ENCOUNTER — Encounter (HOSPITAL_COMMUNITY): Payer: Self-pay | Admitting: *Deleted

## 2018-03-18 ENCOUNTER — Encounter (HOSPITAL_COMMUNITY)
Admission: RE | Disposition: A | Discharge status: Home / Self Care | Payer: Self-pay | Source: Ambulatory Visit | Attending: Cardiovascular Disease

## 2018-03-18 ENCOUNTER — Ambulatory Visit (HOSPITAL_COMMUNITY)
Admission: RE | Admit: 2018-03-18 | Discharge: 2018-03-19 | Disposition: A | Discharge status: Home / Self Care | Payer: Medicare HMO | Source: Ambulatory Visit | Attending: Cardiovascular Disease | Admitting: Cardiovascular Disease

## 2018-03-18 DIAGNOSIS — E876 Hypokalemia: Secondary | ICD-10-CM | POA: Diagnosis not present

## 2018-03-18 DIAGNOSIS — Z79899 Other long term (current) drug therapy: Secondary | ICD-10-CM | POA: Diagnosis not present

## 2018-03-18 DIAGNOSIS — I70212 Atherosclerosis of native arteries of extremities with intermittent claudication, left leg: Secondary | ICD-10-CM | POA: Insufficient documentation

## 2018-03-18 DIAGNOSIS — Z7902 Long term (current) use of antithrombotics/antiplatelets: Secondary | ICD-10-CM | POA: Insufficient documentation

## 2018-03-18 DIAGNOSIS — I482 Chronic atrial fibrillation: Secondary | ICD-10-CM

## 2018-03-18 DIAGNOSIS — I4891 Unspecified atrial fibrillation: Secondary | ICD-10-CM | POA: Insufficient documentation

## 2018-03-18 DIAGNOSIS — I4819 Other persistent atrial fibrillation: Secondary | ICD-10-CM | POA: Diagnosis present

## 2018-03-18 DIAGNOSIS — I110 Hypertensive heart disease with heart failure: Secondary | ICD-10-CM | POA: Insufficient documentation

## 2018-03-18 DIAGNOSIS — Z993 Dependence on wheelchair: Secondary | ICD-10-CM | POA: Insufficient documentation

## 2018-03-18 DIAGNOSIS — I10 Essential (primary) hypertension: Secondary | ICD-10-CM | POA: Diagnosis present

## 2018-03-18 DIAGNOSIS — Z7982 Long term (current) use of aspirin: Secondary | ICD-10-CM | POA: Diagnosis not present

## 2018-03-18 DIAGNOSIS — E663 Overweight: Secondary | ICD-10-CM | POA: Diagnosis not present

## 2018-03-18 DIAGNOSIS — Z23 Encounter for immunization: Secondary | ICD-10-CM | POA: Insufficient documentation

## 2018-03-18 DIAGNOSIS — I5032 Chronic diastolic (congestive) heart failure: Secondary | ICD-10-CM | POA: Diagnosis present

## 2018-03-18 DIAGNOSIS — M1712 Unilateral primary osteoarthritis, left knee: Secondary | ICD-10-CM | POA: Diagnosis not present

## 2018-03-18 DIAGNOSIS — I739 Peripheral vascular disease, unspecified: Secondary | ICD-10-CM | POA: Diagnosis present

## 2018-03-18 DIAGNOSIS — Z6829 Body mass index (BMI) 29.0-29.9, adult: Secondary | ICD-10-CM | POA: Diagnosis not present

## 2018-03-18 DIAGNOSIS — I7092 Chronic total occlusion of artery of the extremities: Secondary | ICD-10-CM

## 2018-03-18 DIAGNOSIS — Z96652 Presence of left artificial knee joint: Secondary | ICD-10-CM | POA: Diagnosis not present

## 2018-03-18 DIAGNOSIS — Z888 Allergy status to other drugs, medicaments and biological substances status: Secondary | ICD-10-CM | POA: Insufficient documentation

## 2018-03-18 DIAGNOSIS — I4821 Permanent atrial fibrillation: Secondary | ICD-10-CM | POA: Diagnosis present

## 2018-03-18 HISTORY — DX: Unspecified hearing loss, unspecified ear: H91.90

## 2018-03-18 HISTORY — PX: LOWER EXTREMITY ANGIOGRAPHY: CATH118251

## 2018-03-18 HISTORY — DX: Personal history of malignant neoplasm of breast: Z85.3

## 2018-03-18 HISTORY — PX: PERIPHERAL VASCULAR INTERVENTION: CATH118257

## 2018-03-18 HISTORY — DX: Peripheral vascular disease, unspecified: I73.9

## 2018-03-18 LAB — POCT ACTIVATED CLOTTING TIME
ACTIVATED CLOTTING TIME: 252 s
ACTIVATED CLOTTING TIME: 246 s
Activated Clotting Time: 263 seconds
Activated Clotting Time: 268 seconds
Activated Clotting Time: 202 seconds
Activated Clotting Time: 175 seconds

## 2018-03-18 SURGERY — LOWER EXTREMITY ANGIOGRAPHY
Anesthesia: LOCAL | Laterality: Right

## 2018-03-18 MED ORDER — POTASSIUM CHLORIDE CRYS ER 20 MEQ PO TBCR: 20.0000 meq | EXTENDED_RELEASE_TABLET | Freq: Two times a day (BID) | ORAL | Status: DC

## 2018-03-18 MED ORDER — SODIUM CHLORIDE 0.9 % IV SOLN: 250.0000 mL | INTRAVENOUS | Status: DC | PRN

## 2018-03-18 MED ORDER — LIDOCAINE HCL (PF) 1 % IJ SOLN
INTRAMUSCULAR | Status: DC | PRN
  Administered 2018-03-18: 15 mL via INTRADERMAL

## 2018-03-18 MED ORDER — IODIXANOL 320 MG/ML IV SOLN
INTRAVENOUS | Status: DC | PRN
  Administered 2018-03-18: 210 mL via INTRA_ARTERIAL

## 2018-03-18 MED ORDER — ONDANSETRON HCL 4 MG/2ML IJ SOLN: 4.0000 mg | Freq: Four times a day (QID) | INTRAMUSCULAR | Status: DC | PRN

## 2018-03-18 MED ORDER — ALPRAZOLAM 0.25 MG PO TABS
0.2500 mg | ORAL_TABLET | Freq: Once | ORAL | Status: AC
  Administered 2018-03-18: 0.25 mg via ORAL

## 2018-03-18 MED ORDER — FUROSEMIDE 40 MG PO TABS
40.0000 mg | ORAL_TABLET | Freq: Two times a day (BID) | ORAL | Status: DC
  Administered 2018-03-18 – 2018-03-19 (×2): 40 mg via ORAL
  Filled 2018-03-18 (×2): qty 1

## 2018-03-18 MED ORDER — HEPARIN (PORCINE) IN NACL 1000-0.9 UT/500ML-% IV SOLN
INTRAVENOUS | Status: DC | PRN
  Administered 2018-03-18 (×3): 500 mL

## 2018-03-18 MED ORDER — SODIUM CHLORIDE 0.9% FLUSH: 3.0000 mL | Freq: Two times a day (BID) | INTRAVENOUS | Status: DC

## 2018-03-18 MED ORDER — CARVEDILOL 12.5 MG PO TABS
12.5000 mg | ORAL_TABLET | Freq: Two times a day (BID) | ORAL | Status: DC
  Administered 2018-03-18 – 2018-03-19 (×2): 12.5 mg via ORAL
  Filled 2018-03-18 (×2): qty 1

## 2018-03-18 MED ORDER — HYDRALAZINE HCL 20 MG/ML IJ SOLN: 5.0000 mg | INTRAMUSCULAR | Status: DC | PRN

## 2018-03-18 MED ORDER — HEPARIN SODIUM (PORCINE) 1000 UNIT/ML IJ SOLN
INTRAMUSCULAR | Status: AC
  Filled 2018-03-18: qty 1

## 2018-03-18 MED ORDER — DIGOXIN 125 MCG PO TABS: 0.1250 mg | ORAL_TABLET | Freq: Every day | ORAL | Status: DC

## 2018-03-18 MED ORDER — ALPRAZOLAM 0.25 MG PO TABS
ORAL_TABLET | ORAL | Status: AC
  Filled 2018-03-18: qty 1

## 2018-03-18 MED ORDER — MORPHINE SULFATE (PF) 2 MG/ML IV SOLN
2.0000 mg | INTRAVENOUS | Status: DC | PRN
  Administered 2018-03-18 – 2018-03-19 (×2): 2 mg via INTRAVENOUS
  Filled 2018-03-18 (×2): qty 1

## 2018-03-18 MED ORDER — CLOPIDOGREL BISULFATE 300 MG PO TABS
ORAL_TABLET | ORAL | Status: DC | PRN
  Administered 2018-03-18: 300 mg via ORAL

## 2018-03-18 MED ORDER — SODIUM CHLORIDE 0.9 % IV SOLN
INTRAVENOUS | Status: AC
  Administered 2018-03-18: 22:00:00 via INTRAVENOUS

## 2018-03-18 MED ORDER — FENTANYL CITRATE (PF) 100 MCG/2ML IJ SOLN
INTRAMUSCULAR | Status: DC | PRN
  Administered 2018-03-18 (×2): 25 ug via INTRAVENOUS

## 2018-03-18 MED ORDER — ACETAMINOPHEN 325 MG PO TABS: 650.0000 mg | ORAL_TABLET | ORAL | Status: DC | PRN

## 2018-03-18 MED ORDER — SODIUM CHLORIDE 0.9 % WEIGHT BASED INFUSION
3.0000 mL/kg/h | INTRAVENOUS | Status: DC
  Administered 2018-03-18: 3 mL/kg/h via INTRAVENOUS

## 2018-03-18 MED ORDER — LABETALOL HCL 5 MG/ML IV SOLN
INTRAVENOUS | Status: AC
  Filled 2018-03-18: qty 4

## 2018-03-18 MED ORDER — SODIUM CHLORIDE 0.9% FLUSH: 3.0000 mL | INTRAVENOUS | Status: DC | PRN

## 2018-03-18 MED ORDER — HEPARIN (PORCINE) IN NACL 1000-0.9 UT/500ML-% IV SOLN
INTRAVENOUS | Status: AC
  Filled 2018-03-18: qty 500

## 2018-03-18 MED ORDER — SODIUM CHLORIDE 0.9 % WEIGHT BASED INFUSION: 1.0000 mL/kg/h | INTRAVENOUS | Status: DC

## 2018-03-18 MED ORDER — LABETALOL HCL 5 MG/ML IV SOLN
10.0000 mg | INTRAVENOUS | Status: DC | PRN
  Administered 2018-03-18: 10 mg via INTRAVENOUS

## 2018-03-18 MED ORDER — HEPARIN SODIUM (PORCINE) 1000 UNIT/ML IJ SOLN
INTRAMUSCULAR | Status: DC | PRN
  Administered 2018-03-18: 2500 [IU] via INTRAVENOUS
  Administered 2018-03-18: 2000 [IU] via INTRAVENOUS
  Administered 2018-03-18: 7000 [IU] via INTRAVENOUS

## 2018-03-18 MED ORDER — CLOPIDOGREL BISULFATE 75 MG PO TABS
75.0000 mg | ORAL_TABLET | Freq: Every day | ORAL | Status: DC
  Administered 2018-03-19: 08:00:00 75 mg via ORAL
  Filled 2018-03-18: qty 1

## 2018-03-18 MED ORDER — LIDOCAINE HCL (PF) 1 % IJ SOLN
INTRAMUSCULAR | Status: AC
  Filled 2018-03-18: qty 30

## 2018-03-18 MED ORDER — CLOPIDOGREL BISULFATE 300 MG PO TABS
ORAL_TABLET | ORAL | Status: AC
  Filled 2018-03-18: qty 1

## 2018-03-18 MED ORDER — ASPIRIN EC 81 MG PO TBEC
81.0000 mg | DELAYED_RELEASE_TABLET | Freq: Every day | ORAL | Status: DC
  Filled 2018-03-18: qty 1

## 2018-03-18 MED ORDER — ASPIRIN 81 MG PO CHEW: 81.0000 mg | CHEWABLE_TABLET | ORAL | Status: DC

## 2018-03-18 MED ORDER — ASPIRIN EC 81 MG PO TBEC
81.0000 mg | DELAYED_RELEASE_TABLET | Freq: Every day | ORAL | Status: DC
  Administered 2018-03-19: 08:00:00 81 mg via ORAL
  Filled 2018-03-18 (×2): qty 1

## 2018-03-18 MED ORDER — FENTANYL CITRATE (PF) 100 MCG/2ML IJ SOLN
INTRAMUSCULAR | Status: AC
  Filled 2018-03-18: qty 2

## 2018-03-18 MED ORDER — SODIUM CHLORIDE 0.9% FLUSH
3.0000 mL | Freq: Two times a day (BID) | INTRAVENOUS | Status: DC
  Administered 2018-03-19: 10:00:00 3 mL via INTRAVENOUS

## 2018-03-18 MED ORDER — HEPARIN (PORCINE) IN NACL 1000-0.9 UT/500ML-% IV SOLN
INTRAVENOUS | Status: AC
  Filled 2018-03-18: qty 1000

## 2018-03-18 MED ORDER — ASPIRIN EC 81 MG PO TBEC: 81.0000 mg | DELAYED_RELEASE_TABLET | Freq: Every day | ORAL | Status: DC

## 2018-03-18 MED ORDER — MORPHINE SULFATE (PF) 10 MG/ML IV SOLN: 2.0000 mg | INTRAVENOUS | Status: DC | PRN

## 2018-03-18 SURGICAL SUPPLY — 35 items
BALLN COYOTE OTW 2.5X150X150 (BALLOONS) ×3
BALLN COYOTE OTW 4X150X150 (BALLOONS) ×3
BALLN STERLING OTW 5X150X150 (BALLOONS) ×3
BALLOON COYOTE OTW 2.5X150X150 (BALLOONS) ×2 IMPLANT
BALLOON COYOTE OTW 4X150X150 (BALLOONS) ×2 IMPLANT
BALLOON STERLING OTW 5X150X150 (BALLOONS) ×2 IMPLANT
CATH ANGIO 5F PIGTAIL 65CM (CATHETERS) ×3 IMPLANT
CATH CROSS OVER TEMPO 5F (CATHETERS) ×3 IMPLANT
CATH NAVICROSS ANGLED 135CM (MICROCATHETER) ×3 IMPLANT
CATH STRAIGHT 5FR 65CM (CATHETERS) ×3 IMPLANT
CATH VIANCE CROSS STAND 150CM (MICROCATHETER) ×3
CATH VIANCE CROSS STD 150CM (MICROCATHETER) ×2 IMPLANT
DEVICE TORQUE .014-.018 (MISCELLANEOUS) ×2 IMPLANT
GUIDEWIRE ANGLED .035X150CM (WIRE) ×3 IMPLANT
GUIDEWIRE ANGLED .035X260CM (WIRE) ×3 IMPLANT
GUIDEWIRE ASTATO XS 20G 300CM (WIRE) ×3 IMPLANT
KIT ENCORE 26 ADVANTAGE (KITS) ×3 IMPLANT
KIT PV (KITS) ×3 IMPLANT
SHEATH HIGHFLEX ANSEL 7FR 55CM (SHEATH) ×3 IMPLANT
SHEATH PINNACLE 5F 10CM (SHEATH) ×3 IMPLANT
SHEATH PINNACLE 7F 10CM (SHEATH) ×3 IMPLANT
SHEATH PINNACLE 8F 10CM (SHEATH) ×3 IMPLANT
SHEATH PROBE COVER 6X72 (BAG) ×3 IMPLANT
STENT ELUVIA 6X120X130 (Permanent Stent) ×3 IMPLANT
STENT ELUVIA 6X60X130 (Permanent Stent) ×3 IMPLANT
STOPCOCK MORSE 400PSI 3WAY (MISCELLANEOUS) ×3 IMPLANT
SYRINGE MEDRAD AVANTA MACH 7 (SYRINGE) ×3 IMPLANT
TAPE VIPERTRACK RADIOPAQ (MISCELLANEOUS) ×2 IMPLANT
TAPE VIPERTRACK RADIOPAQUE (MISCELLANEOUS) ×1
TORQUE DEVICE .014-.018 (MISCELLANEOUS) ×3
TRANSDUCER W/STOPCOCK (MISCELLANEOUS) ×3 IMPLANT
TRAY PV CATH (CUSTOM PROCEDURE TRAY) ×3 IMPLANT
TUBING CIL FLEX 10 FLL-RA (TUBING) ×3 IMPLANT
WIRE HITORQ VERSACORE ST 145CM (WIRE) ×3 IMPLANT
WIRE SPARTACORE .014X300CM (WIRE) ×6 IMPLANT

## 2018-03-18 NOTE — Progress Notes (Addendum)
Site area: RFA Site Prior to Removal:  Level 1 Pressure Applied For: Manual:   yes Patient Status During Pull:   Post Pull Site:  Level Post Pull Instructions Given:  yes Post Pull Pulses Present: doppler DP Dressing Applied:  clear Bedrest begins @  Comments: Relieved at 25 min by Suella Broad

## 2018-03-18 NOTE — Progress Notes (Signed)
Additional manual pressure held for55 minutes.  Noted a level 1-bruise.  Clean dressing applied and care instruction given to pt.  Bedrest started at 1800pm

## 2018-03-18 NOTE — Interval H&P Note (Signed)
History and Physical Interval Note:  03/18/2018 11:10 AM  Sara [name]  has presented today for surgery, with the diagnosis of PAD  The various methods of treatment have been discussed with the patient and family. After consideration of risks, benefits and other options for treatment, the patient has consented to  Procedure(s): LOWER EXTREMITY ANGIOGRAPHY (Right) as a surgical intervention .  The patient's history has been reviewed, patient examined, no change in status, stable for surgery.  I have reviewed the patient's chart and labs.  Questions were answered to the patient's satisfaction.     Quay Burow

## 2018-03-19 ENCOUNTER — Encounter (HOSPITAL_COMMUNITY): Payer: Self-pay | Admitting: Cardiovascular Disease

## 2018-03-19 DIAGNOSIS — E663 Overweight: Secondary | ICD-10-CM | POA: Diagnosis not present

## 2018-03-19 DIAGNOSIS — I4891 Unspecified atrial fibrillation: Secondary | ICD-10-CM | POA: Diagnosis not present

## 2018-03-19 DIAGNOSIS — E876 Hypokalemia: Secondary | ICD-10-CM | POA: Diagnosis not present

## 2018-03-19 DIAGNOSIS — I110 Hypertensive heart disease with heart failure: Secondary | ICD-10-CM | POA: Diagnosis not present

## 2018-03-19 DIAGNOSIS — M1712 Unilateral primary osteoarthritis, left knee: Secondary | ICD-10-CM | POA: Diagnosis not present

## 2018-03-19 DIAGNOSIS — Z23 Encounter for immunization: Secondary | ICD-10-CM | POA: Diagnosis not present

## 2018-03-19 DIAGNOSIS — Z7982 Long term (current) use of aspirin: Secondary | ICD-10-CM | POA: Diagnosis not present

## 2018-03-19 DIAGNOSIS — I70212 Atherosclerosis of native arteries of extremities with intermittent claudication, left leg: Secondary | ICD-10-CM

## 2018-03-19 DIAGNOSIS — I5032 Chronic diastolic (congestive) heart failure: Secondary | ICD-10-CM | POA: Diagnosis not present

## 2018-03-19 LAB — BASIC METABOLIC PANEL
Anion gap: 4 — ABNORMAL LOW (ref 5–15)
BUN: 12 mg/dL (ref 8–23)
CALCIUM: 8.2 mg/dL — AB (ref 8.9–10.3)
CO2: 26 mmol/L (ref 22–32)
CREATININE: 0.75 mg/dL (ref 0.44–1.00)
Chloride: 110 mmol/L (ref 98–111)
GFR calc Af Amer: >60 mL/min (ref >60)
GFR calc non Af Amer: >60 mL/min (ref >60)
Glucose, Bld: 90 mg/dL (ref 70–99)
Potassium: 3.2 mmol/L — ABNORMAL LOW (ref 3.5–5.1)
Sodium: 140 mmol/L (ref 135–145)

## 2018-03-19 LAB — CBC
HCT: 38.2 % (ref 36.0–46.0)
Hemoglobin: 12.3 g/dL (ref 12.0–15.0)
MCH: 29.4 pg (ref 26.0–34.0)
MCHC: 32.2 g/dL (ref 30.0–36.0)
MCV: 91.4 fL (ref 78.0–100.0)
PLATELETS: 192 10*3/uL (ref 150–400)
RBC: 4.18 MIL/uL (ref 3.87–5.11)
RDW: 14.7 % (ref 11.5–15.5)
WBC: 6.4 10*3/uL (ref 4.0–10.5)

## 2018-03-19 MED ORDER — NITROGLYCERIN 0.4 MG SL SUBL: 0.4000 mg | SUBLINGUAL_TABLET | SUBLINGUAL | Status: DC | PRN

## 2018-03-19 MED ORDER — CLOPIDOGREL BISULFATE 75 MG PO TABS: 75.0000 mg | ORAL_TABLET | Freq: Every day | ORAL | 9 refills | Status: DC

## 2018-03-19 MED ORDER — INFLUENZA VAC SPLIT HIGH-DOSE 0.5 ML IM SUSY
0.5000 mL | PREFILLED_SYRINGE | Freq: Once | INTRAMUSCULAR | Status: AC
  Administered 2018-03-19: 0.5 mL via INTRAMUSCULAR
  Filled 2018-03-19: qty 0.5

## 2018-03-19 MED ORDER — POTASSIUM CHLORIDE CRYS ER 20 MEQ PO TBCR: 40.0000 meq | EXTENDED_RELEASE_TABLET | Freq: Two times a day (BID) | ORAL | Status: DC

## 2018-03-19 MED ORDER — POTASSIUM CHLORIDE CRYS ER 20 MEQ PO TBCR: 40.0000 meq | EXTENDED_RELEASE_TABLET | Freq: Two times a day (BID) | ORAL | 4 refills | Status: DC

## 2018-03-19 MED ORDER — POTASSIUM CHLORIDE CRYS ER 20 MEQ PO TBCR
40.0000 meq | EXTENDED_RELEASE_TABLET | Freq: Once | ORAL | Status: AC
  Administered 2018-03-19: 40 meq via ORAL
  Filled 2018-03-19: qty 2

## 2018-03-19 MED ORDER — ANGIOPLASTY BOOK
Freq: Once | Status: AC
  Administered 2018-03-19: 04:00:00 1
  Filled 2018-03-19: qty 1

## 2018-03-19 NOTE — Progress Notes (Signed)
Pt given discharge instructions with understanding. Pt has no questions at this time. Prescriptions given to pt. Iv and monitor d/c.pt called son for a ride.

## 2018-03-19 NOTE — Discharge Summary (Addendum)
Discharge Summary    Patient ID: Sara [name] MRN: [id number]; DOB: [birth year]  Admit date: 03/18/2018 Discharge date: 03/19/2018  Primary Care Provider: Townsend Roger, MD  Primary Cardiologist: Glenetta Hew, MD, Dr. Quay Burow, MD   Discharge Diagnoses   Principal Problem:   Claudication in peripheral vascular disease [facility]) Active Problems:   Essential hypertension   Persistent atrial fibrillation   Chronic diastolic CHF (congestive heart failure), NYHA class 1 (HCC)   Hypokalemia   Unilateral primary osteoarthritis, left knee   Peripheral arterial disease ([place])  Allergies Allergies  Allergen Reactions  . Amiodarone Shortness Of Breath    Stopped in Feb 2017 secondary to pulmonary complications  . Aliskiren Other (See Comments) and Nausea Only    unknown  . Amlodipine Besylate-Valsartan Other (See Comments) and Nausea Only    Unclear  . Bystolic [Nebivolol Hcl] Other (See Comments)    unknown  . Clonidine Derivatives Other (See Comments)    unknown  . Dronedarone Nausea And Vomiting and Nausea Only  . Hctz [Hydrochlorothiazide]     Currently taking without problem; question if this has to do with hypokalemia  . Hydralazine Other (See Comments)    Also tolerated, but had orthostatic changes on standing medication  . Lisinopril Other (See Comments) and Nausea Only    unknown  . Nebivolol Nausea Only  . Olmesartan Nausea Only    Unclear of the intolerance  . Rythmol [Propafenone] Other (See Comments)    unknown  . Statins Other (See Comments) and Nausea Only    unknown  . Carvedilol Other (See Comments) and Nausea And Vomiting    fatigue  . Prednisone Nausea And Vomiting    Jittery    Diagnostic Studies/Procedures    Peripheral Vascular Catheterization 03/18/18:  Angiographic Data:   1: Abdominal aorta- the distal abdominal aorta is free of significant disease 2: Iliac arteries- bilateral iliac arteries were free of significant disease 3: Left  lower extremity- chronic total occlusion left mid and distal SFA as well as P1 segment of the popliteal with two-vessel runoff.  The posterior tibial was occluded  IMPRESSION: [name] has an occluded SFA and proximal popliteal with two-vessel runoff.  She needs left total knee replacement.  We will proceed with endovascular therapy to allow potential healing of her knee.  Procedure Description: Contralateral access was obtained with a 7 French 55 cm Ansell sheath.  Patient received a total of 210 cc of contrast with an ACT of 268.  Omnipaque dye was used for the entirety of the case.  Retrograde aortic pressure was monitored during the case.  I was able to cross the long CTO with AV onset catheter, a 1 4 [place] core wire, a 1 4 start toe 20 CTO wire, 035 angled Nava cross endhole catheter and angled Glidewire.  Once I demonstrated conclusively that I was intraluminal by performing angiography through the endhole catheter I then exchanged for a [place] core wire and performed PTA with a 2.5 mm x 150 mill meter long coyote balloon demonstrating that I was intraluminal.  Because of the potential entrance into various dissection planes I decided not to perform atherectomy but rather balloon with a 4 mm x 150 mm long coyote and stented with overlapping Eluvia  nitinol self-expanding stents (6 x 120, 6 x 60).  I then postdilated to 5 mm x 150 mm on Sterling balloon at nominal pressures resulting reduction along CTO to 0% residual with excellent runoff.  The patient tolerated procedure  well.  The Ansell sheath was then withdrawn across the bifurcation and exchanged over an 035 wire for a short 7 [place] sheath.  There was some oozing with a small hematoma and therefore the sheath was upsized to an 8 [place] sheath. The patient did receive 300 mg of p.o. Plavix.  History of Present Illness     [name] is an 84yo F who was referred to Dr. Gwenlyn Found by Dr. Ellyn Hack for peripheral arterial evaluation prior to elective  left total knee replacement by Dr. Inda Merlin. She had lower extremity arterial Doppler studies performed 01/29/2018 revealing a right ABI 0.76 and a left of 0.5 2.  She had an occluded distal left SFA and popliteal artery. Dr. Gwenlyn Found did not feel that she should undergo her orthopedic surgeon surgical procedure with her diminished ABI and poor blood flow.  She has been symptomatic and wheelchair-bound for the last 2 months and wishes to proceed with angiography potential endovascular therapy to improve blood flow and potentially allow her to have her total knee replacement, scheduled for 03/18/18.   Hospital Course    On 03/18/18 the pt was taken to the [facility] lab and underwent a successful PTA and drug-eluting stenting of a long mid to distal left SFA and proximal popliteal CTO to allow adequate perfusion for healing of a elective left total knee replacement. Plans were made to hydrate the patient overnight and treat her with DAPT.  She has been scheduled for doppler follow studies nxt week on 03/26/18 and Dr. Gwenlyn Found follow up on 04/12/18.    On day of discharge, pt's K+ was 3.2. This was replaced with 47meq K+ x1 and her daily dose was increased to 89meq twice daily. Her renal function was stable at 0.75, post-procedure. Hb was noted to be stable as well at 12.3. Cath sites are unremarkable.   Medication Plan: -ASA 81, Plavix 75 -Increased Kdur to 5meq twice daily   General: Elderly, NAD Skin: Warm, dry, intact  Head: Normocephalic, atraumatic, clear, moist mucus membranes. Neck: Negative for carotid bruits. No JVD Lungs:Clear to ausculation bilaterally. No wheezes, rales, or rhonchi. Breathing is unlabored. Cardiovascular: Irregulary irregular with S1 S2. No murmurs, rubs, gallops, or LV heave appreciated. Abdomen: Soft, non-tender, non-distended with normoactive bowel sounds. No obvious abdominal masses. MSK: Strength and tone appear normal for age. 5/5 in all extremities Extremities: No edema. No  clubbing or cyanosis. DP/PT pulses 1+ bilaterally. Right groin with mild bruising, soft  Neuro: Alert and oriented. No focal deficits. No facial asymmetry. MAE spontaneously. Psych: Responds to questions appropriately with normal affect.    Consultants: None   The patient was seen and examined by Dr. Margaretann Loveless who feel that she is stable and ready for discharge on 03/19/18.  _____________  Discharge Vitals Blood pressure 116/65, pulse (!) 150, temperature 98.2 F (36.8 C), resp. rate 20, height 5' (1.524 m), weight 68.8 kg, SpO2 99 %.  Filed Weights   03/18/18 0812 03/19/18 0507  Weight: 63.5 kg 68.8 kg   Labs & Radiologic Studies    CBC Recent Labs    03/19/18 0341  WBC 6.4  HGB 12.3  HCT 38.2  MCV 91.4  PLT 147   Basic Metabolic Panel Recent Labs    03/19/18 0341  NA 140  K 3.2*  CL 110  CO2 26  GLUCOSE 90  BUN 12  CREATININE 0.75  CALCIUM 8.2*   Disposition   Pt is being discharged home today in good condition.  Follow-up Plans &  Appointments   Follow-up Information    Lorretta Harp, MD Follow up on 04/12/2018.   Specialties:  Cardiology, Radiology Why:  Your follow up appointment will be on 04/12/18 with Dr. Gwenlyn Found.  Contact information: [address] [place] [address] [phone]        [facility] Follow up on 03/26/2018.   Specialty:  Cardiology Why:  Your follow up Doppler studies are scheduled for 03/26/18 at 11am and 12pm at the [facility] office  Contact information: [address] [place] [phone]         Discharge Instructions    Call MD for:  difficulty breathing, headache or visual disturbances   Complete by:  As directed    Call MD for:  extreme fatigue   Complete by:  As directed    Call MD for:  hives   Complete by:  As directed    Call MD for:  persistant dizziness or light-headedness   Complete by:  As directed    Call MD for:  persistant nausea  and vomiting   Complete by:  As directed    Call MD for:  redness, tenderness, or signs of infection (pain, swelling, redness, odor or green/yellow discharge around incision site)   Complete by:  As directed    Call MD for:  severe uncontrolled pain   Complete by:  As directed    Call MD for:  temperature >100.4   Complete by:  As directed    Diet - low sodium heart healthy   Complete by:  As directed    Discharge instructions   Complete by:  As directed    No driving for 3 days. No lifting over 5 lbs for 1 week. No sexual activity for 1 week. Keep procedure site clean & dry. If you notice increased pain, swelling, bleeding or pus, call/return!  You may shower, but no soaking baths/hot tubs/pools for 1 week.   If you notice any bleeding such as blood in stool, black tarry stools, blood in urine, nosebleeds or any other unusual bleeding, call your doctor immediately.  PLEASE DO NOT MISS ANY DOSES OF YOUR PLAVIX!!!!! Also keep a log of you blood pressures and bring back to your follow up appt. Please call the office with any questions.   Patients taking blood thinners should generally stay away from medicines like ibuprofen, Advil, Motrin, naproxen, and Aleve due to risk of stomach bleeding. You may take Tylenol as directed or talk to your primary doctor about alternatives.  Some studies suggest Prilosec/Omeprazole interacts with Plavix. If you need something for reflux symptoms use Protonix for less chance of interaction.   Increase activity slowly   Complete by:  As directed      Discharge Medications    Allergies as of 03/19/2018      Reactions   Amiodarone Shortness Of Breath   Stopped in Feb 2017 secondary to pulmonary complications   Aliskiren Other (See Comments), Nausea Only   unknown   Amlodipine Besylate-valsartan Other (See Comments), Nausea Only   Unclear   Bystolic [nebivolol Hcl] Other (See Comments)   unknown   Clonidine Derivatives Other (See Comments)   unknown     Dronedarone Nausea And Vomiting, Nausea Only   Hctz [hydrochlorothiazide]    Currently taking without problem; question if this has to do with hypokalemia   Hydralazine Other (See Comments)   Also tolerated, but had orthostatic changes on standing medication   Lisinopril  Other (See Comments), Nausea Only   unknown   Nebivolol Nausea Only   Olmesartan Nausea Only   Unclear of the intolerance   Rythmol [propafenone] Other (See Comments)   unknown   Statins Other (See Comments), Nausea Only   unknown   Carvedilol Other (See Comments), Nausea And Vomiting   fatigue   Prednisone Nausea And Vomiting   Jittery       Medication List    TAKE these medications   aspirin EC 81 MG tablet Take 81 mg by mouth daily.   carvedilol 12.5 MG tablet Commonly known as:  COREG Take 12.5 mg in the morning and 18.75 mg in the evening. What changed:    how much to take  how to take this  when to take this  additional instructions   clopidogrel 75 MG tablet Commonly known as:  PLAVIX Take 1 tablet (75 mg total) by mouth daily with breakfast. Start taking on:  03/20/2018   digoxin 0.125 MG tablet Commonly known as:  LANOXIN TAKE 2 TABLETS FOR THE FIRST DOSE THEN ONE TABLET ONCE DAILY   furosemide 40 MG tablet Commonly known as:  LASIX TAKE ONE TABLET BY MOUTH TWICE DAILY What changed:  when to take this   NON FORMULARY Apply 1-2 g topically 4 (four) times daily as needed for pain. C-COMBINATION PAIN CR  (baclofen 2%-doxepin 5%-gabapentin 6%-pentoxifylline 3%-topiramate 2%)   potassium chloride SA 20 MEQ tablet Commonly known as:  K-DUR,KLOR-CON Take 2 tablets (40 mEq total) by mouth 2 (two) times daily. What changed:  how much to take   rOPINIRole 1 MG tablet Commonly known as:  REQUIP Take 1 tablet by mouth at bedtime.       Acute coronary syndrome (MI, NSTEMI, STEMI, etc) this admission?: No.    Outstanding Labs/Studies   BMET to assess K  Duration of Discharge  Encounter   Greater than 30 minutes including physician time.  Signed, Kathyrn Drown, NP 03/19/2018, 11:51 AM  --------------------------------------------------------------------------------- History and all data above reviewed.  Patient examined.  I agree with the findings as above.    Ms. Watt is a pleasant 84 year old female who is status post peripheral vascular catheterization with PTA and drug-eluting stenting of the mid to distal left SFA and proximal popliteal CTO in anticipation of orthopedic surgery.  She had difficulty sleeping overnight due to restless legs however healing well this morning and has no acute concerns.  The patient exam reveals: General: No acute distress, lying in bed comfortably, converses easily Cardiovascular: Irregularly irregular rhythm with tachycardia and occasional ectopy.  No appreciable murmurs. Lungs: Clear to auscultation bilaterally Abdomen: Soft nontender nondistended, positive bowel sounds Extremities: Bilateral lower extremities are warm, well-perfused, with appropriate capillary refill.  There is no edema.  DP and PT pulses are challenging to palpate, as are the bilateral popliteal pulses.  The right femoral groin access site is soft and nonindurated, there is ecchymosis without hematoma. Left femoral groin is c/d/i. Neuro: grossly nonfocal Psych: normal mood and affect, recalls history easily.   Principal Problem:   Claudication in peripheral vascular disease ([place]) Active Problems:   Essential hypertension   Persistent atrial fibrillation   Chronic diastolic CHF (congestive heart failure), NYHA class 1 (HCC)   Hypokalemia   Unilateral primary osteoarthritis, left knee   Peripheral arterial disease ([place])   Follow-up has been arranged with Doppler studies and an office visit with Dr. Gwenlyn Found.  She is on carvedilol and digoxin for atrial fibrillation.  She will be  dismissed on dual antiplatelet therapy status post peripheral vascular  invention.   All available labs, radiology testing, previous records reviewed. Agree with documented assessment and plan.  Elouise Munroe MD 03/19/18 7:09 PM

## 2018-03-25 ENCOUNTER — Other Ambulatory Visit: Payer: Self-pay | Admitting: Cardiovascular Disease

## 2018-03-25 DIAGNOSIS — I739 Peripheral vascular disease, unspecified: Secondary | ICD-10-CM

## 2018-03-26 ENCOUNTER — Ambulatory Visit (HOSPITAL_COMMUNITY)
Admission: RE | Admit: 2018-03-26 | Discharge: 2018-03-26 | Disposition: A | Discharge status: Home / Self Care | Payer: Medicare HMO | Source: Ambulatory Visit | Attending: Cardiovascular Disease | Admitting: Cardiovascular Disease

## 2018-03-26 ENCOUNTER — Inpatient Hospital Stay (HOSPITAL_COMMUNITY): Admit: 2018-03-26 | Payer: Medicare HMO

## 2018-03-26 DIAGNOSIS — I739 Peripheral vascular disease, unspecified: Secondary | ICD-10-CM | POA: Diagnosis not present

## 2018-04-01 ENCOUNTER — Encounter (HOSPITAL_COMMUNITY): Payer: Medicare HMO

## 2018-04-01 ENCOUNTER — Inpatient Hospital Stay (HOSPITAL_COMMUNITY): Admission: RE | Admit: 2018-04-01 | Payer: Medicare HMO | Source: Ambulatory Visit

## 2018-04-03 ENCOUNTER — Encounter (HOSPITAL_COMMUNITY): Payer: Medicare HMO

## 2018-04-12 ENCOUNTER — Ambulatory Visit: Payer: Medicare HMO | Admitting: Cardiovascular Disease

## 2018-04-12 ENCOUNTER — Encounter: Payer: Self-pay | Admitting: Cardiovascular Disease

## 2018-04-12 DIAGNOSIS — I739 Peripheral vascular disease, unspecified: Secondary | ICD-10-CM

## 2018-04-12 NOTE — Patient Instructions (Signed)
Medication Instructions:  NO CHANGE If you need a refill on your cardiac medications before your next appointment, please call your pharmacy.   Lab work: If you have labs (blood work) drawn today and your tests are completely normal, you will receive your results only by: Marland Kitchen MyChart Message (if you have MyChart) OR . A paper copy in the mail If you have any lab test that is abnormal or we need to change your treatment, we will call you to review the results.  Testing/Procedures: Your physician has requested that you have a lower or upper extremity arterial duplex. This test is an ultrasound of the arteries in the legs or arms. It looks at arterial blood flow in the legs and arms. Allow one hour for Lower and Upper Arterial scans. There are no restrictions or special instructions SCHEDULE IN 6 MONTHS  Follow-Up: At [facility], you and your health needs are our priority.  As part of our continuing mission to provide you with exceptional heart care, we have created designated Provider Care Teams.  These Care Teams include your primary Cardiologist (physician) and Advanced Practice Providers (APPs -  Physician Assistants and Nurse Practitioners) who all work together to provide you with the care you need, when you need it. You will need a follow up appointment in 6 months.  Please call our office 2 months in advance to schedule this appointment.  You may see Quay Burow MD or one of the following Advanced Practice Providers on your designated Care Team:   Kerin Ransom, PA-C Roby Lofts, [place] . Sande Rives, PA-C  Your physician recommends that you schedule a follow-up appointment in: [place]

## 2018-04-12 NOTE — Assessment & Plan Note (Signed)
history of PAD status post left lower extremity PTA and drug-eluting stenting of a CTO in the setting of claudication and preoperative evaluation for left total knee replacement.  Her follow-up Doppler showed a widely patent SFA with improvement in her ABI and duplex.  She says her claudication has improved.  She does have some peripheral edema which began after revascularization which is not unusual.  We talked about raising her leg.  This point do not want to start her on a diuretic.  She can interrupt her Plavix after 3 months of antiplatelet therapy for knee replacement if necessary.  We will get Dopplers on her every 6 months and I will see her back after that for further evaluation.

## 2018-04-12 NOTE — Progress Notes (Signed)
04/12/2018 Sara [name]   July 03, 1925  [id number]  Primary Physician Sara Roger, MD Primary Cardiologist: Sara Harp MD Sara [name], [place]  HPI:  Sara [name] is a 84 y.o.  moderately overweight widowed Caucasian female mother of 58, grandmother one grandchild who is accompanied by her Sister Sara [name] who is a patient of mine as well.  She was referred by Dr. Glenetta [name] for peripheral vascular evaluation because of an abnormal lower extremity arterial Doppler study performed in preoperative evaluation prior to elective left total knee replacement by Dr. Lorin [name] .  I last saw her in the office 02/27/2018. She has a history of hypertension and chronic atrial fibrillation.  She has been wheelchair-bound for 2 months because of pain in her left knee.  Dopplers performed 01/29/2018 revealed a right ABI 0.76 and left 0.57 with an occluded left distal SFA and popliteal artery.  She did complain of some claudication prior to this.  There is no evidence of critical limb ischemia.  I performed peripheral angiography and intervention on her 03/18/2018 with PTA and drug-eluting stenting of left SFA CTO with two-vessel runoff.  Post procedure Dopplers performed 03/26/2018 were markedly improved.  Claudication has improved as well.  She does have some mild lower extremity edema as result of revascularization of a CTO.  We will keep her on dual antiplatelet therapy for 3 months after which she can hold her Plavix for elective left total knee replacement.  Current Meds  Medication Sig  . aspirin EC 81 MG tablet Take 81 mg by mouth daily.  . carvedilol (COREG) 12.5 MG tablet Take 12.5 mg in the morning and 18.75 mg in the evening. (Patient taking differently: Take 12.5 mg by mouth 2 (two) times daily. )  . clopidogrel (PLAVIX) 75 MG tablet Take 1 tablet (75 mg total) by mouth daily with breakfast.  . digoxin (LANOXIN) 0.125 MG tablet TAKE 2 TABLETS FOR THE FIRST DOSE THEN ONE TABLET  ONCE DAILY  . furosemide (LASIX) 40 MG tablet TAKE ONE TABLET BY MOUTH TWICE DAILY (Patient taking differently: Take 40 mg by mouth 2 (two) times daily. )  . NON FORMULARY Apply 1-2 g topically 4 (four) times daily as needed for pain. C-COMBINATION PAIN CR  (baclofen 2%-doxepin 5%-gabapentin 6%-pentoxifylline 3%-topiramate 2%)  . potassium chloride SA (K-DUR,KLOR-CON) 20 MEQ tablet Take 2 tablets (40 mEq total) by mouth 2 (two) times daily.  Marland Kitchen rOPINIRole (REQUIP) 1 MG tablet Take 1 tablet by mouth at bedtime.      Allergies  Allergen Reactions  . Amiodarone Shortness Of Breath    Stopped in Feb 2017 secondary to pulmonary complications  . Aliskiren Other (See Comments) and Nausea Only    unknown  . Amlodipine Besylate-Valsartan Other (See Comments) and Nausea Only    Unclear  . Bystolic [Nebivolol Hcl] Other (See Comments)    unknown  . Clonidine Derivatives Other (See Comments)    unknown  . Dronedarone Nausea And Vomiting and Nausea Only  . Hctz [Hydrochlorothiazide]     Currently taking without problem; question if this has to do with hypokalemia  . Hydralazine Other (See Comments)    Also tolerated, but had orthostatic changes on standing medication  . Lisinopril Other (See Comments) and Nausea Only    unknown  . Nebivolol Nausea Only  . Olmesartan Nausea Only    Unclear of the intolerance  . Rythmol [Propafenone] Other (See Comments)    unknown  . Statins Other (  See Comments) and Nausea Only    unknown  . Carvedilol Other (See Comments) and Nausea And Vomiting    fatigue  . Prednisone Nausea And Vomiting    Jittery     Social History   Socioeconomic History  . Marital status: Widowed    Spouse name: Not on file  . Number of children: 1  . Years of education: Not on file  . Highest education level: Not on file  Occupational History    Employer: RETIRED   Social Needs  . Financial resource strain: Not on file  . Food insecurity:    Worry: Not on file     Inability: Not on file  . Transportation needs:    Medical: Not on file    Non-medical: Not on file  Tobacco Use  . Smoking status: Never Smoker  . Smokeless tobacco: Never Used  Substance and Sexual Activity  . Alcohol use: No  . Drug use: No  . Sexual activity: Not on file  Lifestyle  . Physical activity:    Days per week: Not on file    Minutes per session: Not on file  . Stress: Not on file  Relationships  . Social connections:    Talks on phone: Not on file    Gets together: Not on file    Attends religious service: Not on file    Active member of club or organization: Not on file    Attends meetings of clubs or organizations: Not on file    Relationship status: Not on file  . Intimate partner violence:    Fear of current or ex partner: Not on file    Emotionally abused: Not on file    Physically abused: Not on file    Forced sexual activity: Not on file  Other Topics Concern  . Not on file  Social History Narrative   She is a widowed mother of one and grandmother of one.  She does not exercise.  She does not smoke and does not drink alcohol.     Review of Systems: General: negative for chills, fever, night sweats or weight changes.  Cardiovascular: negative for chest pain, dyspnea on exertion, edema, orthopnea, palpitations, paroxysmal nocturnal dyspnea or shortness of breath Dermatological: negative for rash Respiratory: negative for cough or wheezing Urologic: negative for hematuria Abdominal: negative for nausea, vomiting, diarrhea, bright red blood per rectum, melena, or hematemesis Neurologic: negative for visual changes, syncope, or dizziness All other systems reviewed and are otherwise negative except as noted above.    Blood pressure 124/74, pulse 96, height 5' (1.524 m), weight 149 lb (67.6 kg).  General appearance: alert and no distress Neck: no adenopathy, no carotid bruit, no JVD, supple, symmetrical, trachea midline and thyroid not enlarged,  symmetric, no tenderness/mass/nodules Lungs: clear to auscultation bilaterally Heart: irregularly irregular rhythm Extremities: 1+ edema left lower extremity Pulses: 2+ and symmetric Skin: Skin color, texture, turgor normal. No rashes or lesions Neurologic: Alert and oriented X 3, normal strength and tone. Normal symmetric reflexes. Normal coordination and gait  EKG not performed today  ASSESSMENT AND PLAN:   Claudication in peripheral vascular disease ([place])  history of PAD status post left lower extremity PTA and drug-eluting stenting of a CTO in the setting of claudication and preoperative evaluation for left total knee replacement.  Her follow-up Doppler showed a widely patent SFA with improvement in her ABI and duplex.  She says her claudication has improved.  She does have some peripheral edema which  began after revascularization which is not unusual.  We talked about raising her leg.  This point do not want to start her on a diuretic.  She can interrupt her Plavix after 3 months of antiplatelet therapy for knee replacement if necessary.  We will get Dopplers on her every 6 months and I will see her back after that for further evaluation.      Sara Harp MD FACP,FACC,FAHA, [facility] 04/12/2018 10:29 AM

## 2018-04-18 ENCOUNTER — Ambulatory Visit (INDEPENDENT_AMBULATORY_CARE_PROVIDER_SITE_OTHER): Payer: Medicare HMO | Admitting: Physician Assistant

## 2018-04-21 ENCOUNTER — Other Ambulatory Visit: Payer: Self-pay | Admitting: Cardiology

## 2018-04-23 ENCOUNTER — Encounter (INDEPENDENT_AMBULATORY_CARE_PROVIDER_SITE_OTHER): Payer: Self-pay | Admitting: Orthopaedic Surgery

## 2018-04-23 ENCOUNTER — Ambulatory Visit (INDEPENDENT_AMBULATORY_CARE_PROVIDER_SITE_OTHER): Payer: Medicare HMO | Admitting: Orthopaedic Surgery

## 2018-04-23 DIAGNOSIS — M79642 Pain in left hand: Secondary | ICD-10-CM | POA: Diagnosis not present

## 2018-04-23 MED ORDER — BUPIVACAINE HCL 0.25 % IJ SOLN
0.3300 mL | INTRAMUSCULAR | Status: AC | PRN
  Administered 2018-04-23: .33 mL

## 2018-04-23 MED ORDER — LIDOCAINE HCL 1 % IJ SOLN
0.3000 mL | INTRAMUSCULAR | Status: AC | PRN
  Administered 2018-04-23: .3 mL

## 2018-04-23 NOTE — Progress Notes (Signed)
Office Visit Note   Patient: Sara [name]           Date of Birth: [birth year]           MRN: [id number] Visit Date: 04/23/2018              Requested by: Townsend Roger, MD [address] [place], [place] [address] PCP: Townsend Roger, MD   Assessment & Plan: Visit Diagnoses:  1. Left hand pain     Plan: Impression is left hand extensor tendon sheath cyst.  We will re-aspirate this today with the understanding that this will likely return.  One cc straw color gelatinous fluid was aspirated. She will follow-up as needed.  Follow-Up Instructions: Return if symptoms worsen or fail to improve.   Orders:  Orders Placed This Encounter  Procedures  . Hand/UE Inj: L wrist   No orders of the defined types were placed in this encounter.     Procedures: Hand/UE Inj: L wrist for dorsal carpal ganglion on 04/23/2018 2:56 PM Medications: 0.3 mL lidocaine 1 %; 0.33 mL bupivacaine 0.25 %      Clinical Data: No additional findings.   Subjective: Chief Complaint  Patient presents with  . Left Hand - Follow-up    Cyst on hand, she would like it drained    HPI patient is an 84 year old female who presents to our clinic today with recurrent cyst to the dorsum of the left wrist and hand.  This started to appear over 6 months ago when she was getting up from the toilet.  She was seen by Dr. Lorin Mercy in May 2019 for this.  He aspirated 15 cc fluid from this ganglion.  She notes that the cyst returned approximately 2 weeks after aspiration.  It does fluctuate in size.  Minimal pain.  No fevers no chills.  Review of Systems as detailed in HPI.  All others reviewed and are negative.   Objective: Vital Signs: There were no vitals taken for this visit.  Physical Exam well-developed well-nourished female in no acute distress.  Alert and oriented x3.  Ortho Exam examination of her left hand reveals 2 cystic formations to the dorsum of the hand between the second and third extensor  tendons.  These are approximately 2 cm in size each.  These are fluctuant and nontender.  No signs of infection or cellulitis.  She has full range of motion of the left hand and wrist.  Specialty Comments:  No specialty comments available.  Imaging: No new imaging   PMFS History: Patient Active Problem List   Diagnosis Date Noted  . Left hand pain 04/23/2018  . Claudication in peripheral vascular disease ([place]) 03/18/2018  . Peripheral arterial disease ([place]) 02/27/2018  . Preop cardiovascular exam 01/19/2018  . Unilateral primary osteoarthritis, left knee 01/08/2018  . Hx of total knee arthroplasty, right 11/16/2017  . Hypokalemia   . Chronic diastolic CHF (congestive heart failure), NYHA class 1 ([place]) 05/16/2016  . Gait abnormality 04/11/2016  . History of breast cancer 04/11/2016  . Persistent atrial fibrillation 03/29/2016  . Paroxysmal atrial fibrillation ([place]) 03/29/2016  . Atypical syncope 03/26/2016  . 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 Obesity (BMI 30-39.9) 03/18/2013  . Essential hypertension 03/18/2013   Past Medical History:  Diagnosis Date  . Ankle edema    Mild, which is intermittent, usually mildly dependent, left slightly greater than the right.  . Anxiety   . Arthritis    In her knees  .  Balance problem    Due to weakened knee  . Chronic diastolic heart failure (HCC)    Class I-II -- exacerbated by Afib RVR  . Corneal edema    Severe corneal edema in right eye with multiple folds. Developed after cataract surgery 05/03/09  . GERD (gastroesophageal reflux disease)   . Hematuria    Resolved after coumadin was stopped  . Hiatal hernia    With GERD  . History of breast cancer   . HOH (hard of hearing)   . Hyperglycemia    Random, without any history of diabetes  . Hyperlipidemia   . Hypertension    Difficult to control. Renal Doppler 06/02/10 showed less that 60% bilateral renal artery narrowing.  . Hypokalemia   . PAD (peripheral artery  disease) ([place])   . Permanent atrial fibrillation    Rate control.  ASA.  NOT on DOAC or warfarin due to concern for falls &bleeding.  Marland Kitchen Posthemorrhagic anemia   . Pseudophakia   . Syncope 02/22/11   During OV on 02/22/11 her INR was 7.3 and was given 2.5 mg of vitamin K. Later that day she became diaphoretic & fainted.  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 Cancer Mother        Mouth  . Hypertension Mother   . Coronary artery disease Brother   . Hypertension Brother   . Diabetes type II Brother   . Heart attack Sister   . Hypertension Sister     Past Surgical History:  Procedure Laterality Date  . BREAST BIOPSY    . CARDIOVERSION N/A 03/31/2016   Procedure: CARDIOVERSION;  Surgeon: Jerline Pain, MD;  Location: [facility] ENDOSCOPY;  Service: Cardiovascular: Successful cardioversion from A. fib to sinus rhythm  . CATARACT EXTRACTION  05/03/09  . CHOLECYSTECTOMY    . HEMORRHOID SURGERY    . LOWER EXTREMITY ANGIOGRAPHY Right 03/18/2018   Procedure: LOWER EXTREMITY ANGIOGRAPHY;  Surgeon: Lorretta Harp, MD;  Location: [facility] LAB;  Service: Cardiovascular;  Laterality: Right;  . PERIPHERAL VASCULAR INTERVENTION  03/18/2018   Procedure: PERIPHERAL VASCULAR INTERVENTION;  Surgeon: Lorretta Harp, MD;  Location: [facility];  Service: Cardiovascular;;  left SFA  . Remote hysterectomy    . TEE WITHOUT CARDIOVERSION N/A 03/31/2016   Procedure: TRANSESOPHAGEAL ECHOCARDIOGRAM (TEE);  Surgeon: Jerline Pain, MD;  Location: [facility];  Service: Cardiovascular:  EF 55-60%. No vegetation or thrombus okay for DC CV  . TONSILLECTOMY    . TOTAL KNEE ARTHROPLASTY Right 08/26/10   By Dr. Elta Guadeloupe C. Yates. Cemented, computer assist  . TRANSTHORACIC ECHOCARDIOGRAM  03/2016    Mild LVH. EF 65-70%. High LVEDP no RWMA. Severe LA dilation. Peak PAP ~ 37 mmHg   Social History   Occupational History    Employer: RETIRED   Tobacco Use    . Smoking status: Never Smoker  . Smokeless tobacco: Never Used  Substance and Sexual Activity  . Alcohol use: No  . Drug use: No  . Sexual activity: Not on file

## 2018-05-17 ENCOUNTER — Other Ambulatory Visit: Payer: Self-pay | Admitting: Cardiology

## 2018-05-22 ENCOUNTER — Ambulatory Visit: Payer: Medicare HMO | Admitting: Cardiology

## 2018-05-22 ENCOUNTER — Encounter: Payer: Self-pay | Admitting: Cardiology

## 2018-05-22 VITALS — BP 132/68 | HR 109 | Ht 60.0 in | Wt 149.0 lb

## 2018-05-22 DIAGNOSIS — I4819 Other persistent atrial fibrillation: Secondary | ICD-10-CM | POA: Diagnosis not present

## 2018-05-22 DIAGNOSIS — I5032 Chronic diastolic (congestive) heart failure: Secondary | ICD-10-CM

## 2018-05-22 DIAGNOSIS — I10 Essential (primary) hypertension: Secondary | ICD-10-CM

## 2018-05-22 DIAGNOSIS — I739 Peripheral vascular disease, unspecified: Secondary | ICD-10-CM

## 2018-05-22 DIAGNOSIS — Z01810 Encounter for preprocedural cardiovascular examination: Secondary | ICD-10-CM | POA: Diagnosis not present

## 2018-05-22 MED ORDER — DILTIAZEM HCL ER COATED BEADS 180 MG PO CP24: 180.0000 mg | ORAL_CAPSULE | Freq: Every day | ORAL | 3 refills | Status: DC

## 2018-05-22 NOTE — Patient Instructions (Signed)
Medication Instructions:   START DILTIAZEM CD 180 MG -TAKE AT BEDTIME - ONE CAPSULE  MAY HAVE SURGERY ATER JAN 1 ,2020- MAYBE ABLE TO STOP PLAVIX 5 DAYS PRIOR TO SURGERY PREFERABLE CONTINUE TAKING ASPIRIN 81 MG. SEE LETTER. If you need a refill on your cardiac medications before your next appointment, please call your pharmacy.   Lab work: NOT NEEDED If you have labs (blood work) drawn today and your tests are completely normal, you will receive your results only by: Marland Kitchen MyChart Message (if you have MyChart) OR . A paper copy in the mail If you have any lab test that is abnormal or we need to change your treatment, we will call you to review the results.  Testing/Procedures: NOT NEEDED  Follow-Up: At [facility], you and your health needs are our priority.  As part of our continuing mission to provide you with exceptional heart care, we have created designated Provider Care Teams.  These Care Teams include your primary Cardiologist (physician) and Advanced Practice Providers (APPs -  Physician Assistants and Nurse Practitioners) who all work together to provide you with the care you need, when you need it. You will need a follow up appointment in 2 months -- END OF FEB 2020.  Please call our office 2 months in advance to schedule this appointment.  You may see Glenetta Hew, MD or one of the following Advanced Practice Providers on your designated Care Team:   Rosaria Ferries, PA-C . Jory Sims, DNP, ANP  Any Other Special Instructions Will Be Listed Below (If Applicable). NOT NEEDED

## 2018-05-22 NOTE — Progress Notes (Signed)
PCP: Townsend Roger, MD  Orthopedic Surgeon: Dr. Rodell Perna ([place])  Clinic Note: Chief Complaint  Patient presents with  . Follow-up    Preop evaluation for knee surgery  . Atrial Fibrillation    HPI: Sara [name] is a 84 y.o. female with a PMH notable for atrial fibrillation (rate controlled) along with PAD who presents today for roughly 71-month follow-up to reconsider preop assessment.[name] was last seen on January 17, 2018 for preoperative evaluation for knee surgery.  There is concern for reduced pulses on the left leg and Dopplers were performed showing significant reduced ABIs.  I then referred her to Dr. Dr. Gwenlyn Found, who subsequently performed a peripheral arterial angiography and PTCA-PCI of the SFA.  Recent Hospitalizations:   March 18, 2018 for lower extremity angiography and PTA.  (Dr. Gwenlyn Found)  Studies Personally Reviewed - (if available, images/films reviewed: From Epic Chart or Care Everywhere)  LEA Dopplers 01/29/2018 : right ABI 0.76 and left 0.57 with an occluded left distal SFA and popliteal artery. She did complain of some claudication prior to this. There is no evidence of critical limb ischemia.   March 18, 2018 LEA angiography and peripheral PTA. Angiographic Data: 1: Abdominal aorta & Iliac arteries - the distal abdominal aorta & bilateral iliac arteries are free of significant disease.  2: Left lower extremity- CTO L mid-distal SFA as well as P1 segment of the popliteal with two-vessel runoff. The posterior tibial was occluded  IMPRESSION: Ooccluded SFA and proximal popliteal with two-vessel runoff. She needs left total knee replacement. We will proceed with endovascular therapy to allow potential healing of her knee.  Final Impression: Successful PTA and drug-eluting stenting of a long mid to distal left SFA and proximal popliteal CTO to allow adequate perfusion for healing of a elective left total knee replacement. =  restart DAPT. Post-cath, she was seen by Dr. Gwenlyn Found on October 25 --> improved claudication.  Plan 3 months to like to plan therapy at this time she could not hold Plavix for surgery.  Interval History: [name] returns here today noting that her knee is really bothering her a lot.  Quite a bit of pain.  She has been noticing that her heart rate is been going faster of late describing it as a irregular fast heartbeat that makes her little bit short of breath.  Not really associated with any chest tightness or pressure, just dyspnea.  Says she really is doing much activity, she has not had any like near syncopal or syncopal-like Episodes.  No PND, orthopnea with trivial lower extremity edema.  Energy level is been down, mostly because she just not able to do anything. She is not sure if her energy level being down is more related to her just being upset about being wheelchair-bound versus real symptom.  No TIA or amaurosis fugax symptoms.  Has not had any significant bleeding on aspirin and Plavix.  Because of her inability to walk, we are unable to assess if she is having any claudication post peripheral PCA/PCI  No melena, hematochezia, hematuria, or epstaxis. No claudication.  She is now hoping to get her knee surgery done, but is worried because of her heart rate  ROS: A comprehensive was performed. Review of Systems  Respiratory: Negative for cough.   Musculoskeletal: Positive for joint pain (Severe left-sided knee pain.  Unable to walk or even stand).  Neurological: Positive for dizziness (Probably more balance related.) and weakness (Both legs). Negative  for focal weakness.  Psychiatric/Behavioral: Positive for memory loss. Negative for depression. The patient is not nervous/anxious.     I have reviewed and (if needed) personally updated the patient's problem list, medications, allergies, past medical and surgical history, social and family history.   Past Medical History:  Diagnosis Date    . Ankle edema    Mild, which is intermittent, usually mildly dependent, left slightly greater than the right.  . Anxiety   . Arthritis    In her knees  . Balance problem    Due to weakened knee  . Chronic diastolic heart failure (HCC)    Class I-II -- exacerbated by Afib RVR  . Corneal edema    Severe corneal edema in right eye with multiple folds. Developed after cataract surgery 05/03/09  . GERD (gastroesophageal reflux disease)   . Hematuria    Resolved after coumadin was stopped  . Hiatal hernia    With GERD  . History of breast cancer   . HOH (hard of hearing)   . Hyperglycemia    Random, without any history of diabetes  . Hyperlipidemia   . Hypertension    Difficult to control. Renal Doppler 06/02/10 showed less that 60% bilateral renal artery narrowing.  . Hypokalemia   . PAD (peripheral artery disease) ([place])   . Permanent atrial fibrillation    Rate control.  ASA.  NOT on DOAC or warfarin due to concern for falls &bleeding.  Marland Kitchen Posthemorrhagic anemia   . Pseudophakia   . Syncope 02/22/11   During OV on 02/22/11 her INR was 7.3 and was given 2.5 mg of vitamin K. Later that day she became diaphoretic & fainted.  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 BREAST BIOPSY    . CARDIOVERSION N/A 03/31/2016   Procedure: CARDIOVERSION;  Surgeon: Jerline Pain, MD;  Location[facility] ENDOSCOPY;  Service: Cardiovascular: Successful cardioversion from A. fib to sinus rhythm  . CATARACT EXTRACTION  05/03/09  . CHOLECYSTECTOMY    . HEMORRHOID SURGERY    . LOWER EXTREMITY ANGIOGRAPHY Right 03/18/2018   Procedure: LOWER EXTREMITY ANGIOGRAPHY;  Surgeon: Lorretta Harp, MD;  Location: [facility];  Service: Cardiovascular;  Laterality: Right;  . PERIPHERAL VASCULAR INTERVENTION  03/18/2018   Procedure: PERIPHERAL VASCULAR INTERVENTION;  Surgeon: Lorretta Harp, MD;  Location: [facility];  Service: Cardiovascular;;   left SFA  . Remote hysterectomy    . TEE WITHOUT CARDIOVERSION N/A 03/31/2016   Procedure: TRANSESOPHAGEAL ECHOCARDIOGRAM (TEE);  Surgeon: Jerline Pain, MD;  Location: [facility];  Service: Cardiovascular:  EF 55-60%. No vegetation or thrombus okay for DC CV  . TONSILLECTOMY    . TOTAL KNEE ARTHROPLASTY Right 08/26/10   By Dr. Elta Guadeloupe C. Yates. Cemented, computer assist  . TRANSTHORACIC ECHOCARDIOGRAM  03/2016    Mild LVH. EF 65-70%. High LVEDP no RWMA. Severe LA dilation. Peak PAP ~ 37 mmHg    No outpatient medications have been marked as taking for the 05/22/18 encounter (Office Visit) with Leonie Man, MD.    Current Outpatient Medications:  .  aspirin EC 81 MG tablet, Take 81 mg by mouth daily., Disp: , Rfl:  .  carvedilol (COREG) 12.5 MG tablet, Take 1 tablet (12.5 mg total) by mouth 2 (two) times daily., Disp: 180 tablet, Rfl: 1 .  clopidogrel (PLAVIX) 75 MG tablet, Take 1 tablet (75 mg total) by mouth daily  with breakfast., Disp: 90 tablet, Rfl: 9 .  digoxin (LANOXIN) 0.125 MG tablet, TAKE 2 TABLETS FOR THE FIRST DOSE THEN ONE TABLET ONCE DAILY, Disp: 92 tablet, Rfl: 3  .  furosemide (LASIX) 40 MG tablet, TAKE ONE TABLET BY MOUTH TWICE DAILY, Disp: 60 tablet, Rfl: 6 .  NON FORMULARY, Apply 1-2 g topically 4 (four) times daily as needed for pain. C-COMBINATION PAIN CR  (baclofen 2%-doxepin 5%-gabapentin 6%-pentoxifylline 3%-topiramate 2%), Disp: , Rfl: 2 .  potassium chloride SA (K-DUR,KLOR-CON) 20 MEQ tablet, Take 2 tablets (40 mEq total) by mouth 2 (two) times daily., Disp: 180 tablet, Rfl: 4 .  rOPINIRole (REQUIP) 1 MG tablet, Take 1 tablet by mouth at bedtime. , Disp: , Rfl: 3  Allergies  Allergen Reactions  . Amiodarone Shortness Of Breath    Stopped in Feb 2017 secondary to pulmonary complications  . Aliskiren Other (See Comments) and Nausea Only    unknown  . Amlodipine Besylate-Valsartan Other (See Comments) and Nausea Only    Unclear  . Bystolic [Nebivolol Hcl]  Other (See Comments)    unknown  . Clonidine Derivatives Other (See Comments)    unknown  . Dronedarone Nausea And Vomiting and Nausea Only  . Hctz [Hydrochlorothiazide]     Currently taking without problem; question if this has to do with hypokalemia  . Hydralazine Other (See Comments)    Also tolerated, but had orthostatic changes on standing medication  . Lisinopril Other (See Comments) and Nausea Only    unknown  . Nebivolol Nausea Only  . Olmesartan Nausea Only    Unclear of the intolerance  . Rythmol [Propafenone] Other (See Comments)    unknown  . Statins Other (See Comments) and Nausea Only    unknown  . Carvedilol Other (See Comments) and Nausea And Vomiting    fatigue  . Prednisone Nausea And Vomiting    Jittery     Social History   Tobacco Use  . Smoking status: Never Smoker  . Smokeless tobacco: Never Used  Substance Use Topics  . Alcohol use: No  . Drug use: No   Social History   Social History Narrative   She is a widowed mother of one and grandmother of one.  She does not exercise.  She does not smoke and does not drink alcohol.    family history includes Cancer in her mother; Coronary artery disease in her brother; Diabetes type II in her brother; Heart attack in her sister; Hypertension in her brother, mother, and sister.  Wt Readings from Last 3 Encounters:  05/22/18 149 lb (67.6 kg)  04/12/18 149 lb (67.6 kg)  03/19/18 151 lb 10.8 oz (68.8 kg)    PHYSICAL EXAM BP 132/68 (BP Location: Right Arm, Patient Position: Sitting, Cuff Size: Normal)   Pulse (!) 109   Ht 5' (1.524 m)   Wt 149 lb (67.6 kg)   BMI 29.10 kg/m  Physical Exam  Constitutional: She is oriented to person, place, and time. She appears well-developed and well-nourished. No distress.  Well-groomed.  HENT:  Head: Normocephalic and atraumatic.  Neck: Normal range of motion. Neck supple. No hepatojugular reflux and no JVD present. Carotid bruit is not present.  Cardiovascular: An  irregularly irregular rhythm present. Tachycardia present. PMI is not displaced. Exam reveals distant heart sounds and decreased pulses (Decreased but now palpable bilateral pedal). Exam reveals no gallop and no friction rub.  No murmur heard. Pulmonary/Chest: Effort normal. No respiratory distress. She has no wheezes. She has no  rales. She exhibits tenderness (Along the sternal border).  Mild interstitial breath sounds  Abdominal: Soft. Bowel sounds are normal. She exhibits no distension. There is no tenderness. There is no rebound.  No HSM  Musculoskeletal: Normal range of motion. She exhibits edema (Trivial right, ~1+ left).  Neurological: She is alert and oriented to person, place, and time.  Psychiatric: She has a normal mood and affect. Her behavior is normal.  Has poor recollection of previous conversations.  Asked many questions in different ways with the same answer provided. Notable poor short-term memory --  Somewhat repetitious   Vitals reviewed.   Adult ECG Report  Rate: 109 ;  Rhythm: atrial fibrillation and Rapid ventricular rate.  RBBB.  Associated T wave inversions in the inferolateral leads, cannot exclude acute ischemia.;   Narrative Interpretation: Abnormal EKG, but similar to previous PCP EKG.   Other studies Reviewed: Additional studies/ records that were reviewed today include:  Recent Labs:   Lab Results  Component Value Date   CREATININE 0.75 03/19/2018   BUN 12 03/19/2018   NA 140 03/19/2018   K 3.2 (L) 03/19/2018   CL 110 03/19/2018   CO2 26 03/19/2018   No results found for: CHOL, HDL, LDLCALC, LDLDIRECT, TRIG, CHOLHDL  ASSESSMENT / PLAN: Problem List Items Addressed This Visit    Chronic diastolic CHF (congestive heart failure), NYHA class 1 (HCC) (Chronic)    Relatively stable.  Seems euvolemic.  Not having any need to take additional dose of furosemide.  Quite often she does not take both doses.      Relevant Medications   diltiazem (CARDIZEM CD)  180 MG 24 hr capsule   Essential hypertension (Chronic)    Stable blood pressure today on current dose of carvedilol      Relevant Medications   diltiazem (CARDIZEM CD) 180 MG 24 hr capsule   Peripheral arterial disease (HCC) (Chronic)    Status post peripheral vascular intervention of left SFA and popliteal artery.  This should allow for her knee to heal with upcoming surgery. Plan per Dr. Gwenlyn Found was 3 months of aspirin and Plavix, at which time we can stop hopefully just Plavix for short time preoperatively and restart postop.      Relevant Medications   diltiazem (CARDIZEM CD) 180 MG 24 hr capsule   Persistent atrial fibrillation;; CHA2DS2-VASc Score and unadjusted Ischemic Stroke Rate (% per year) is equal to 4.8 % stroke rate/year from a score of 4 - Primary (Chronic)    Borderline rate control.  On fortunately, we have tried to increase her up to 18.7 mg carvedilol, but that has not happened.  She never did increase the dose.  We did start her on low-dose digoxin, but is still having heart rate episodes that are quite fast.  She does have blood pressure room to add additional medication.  Plan: Add diltiazem 100 mg p.o. for additional rate control, especially periprocedural.  Can reassess her postoperatively in order to determine if she requires it long-term. Has refused oral anticoagulation in the past.  Continuing aspirin and Plavix, especially in light of her recent peripheral vascular procedure.  Would be okay as of January 1 to hold Plavix (preferably stay on aspirin if possible)      Relevant Medications   diltiazem (CARDIZEM CD) 180 MG 24 hr capsule   Other Relevant Orders   EKG 12-Lead (Completed)   Preop cardiovascular exam    She really does not have any true active cardiac issues besides her  A. fib.  She does not have history of CAD.  No diabetes or renal function noted.  No stroke history.  Knee surgery would be considered low risk from a cardiac standpoint. Would like  to see better rate control perioperatively.  We will start low-dose diltiazem XT 180 mg p.o --> may actually discontinue on that dose, but can reassess postop.  Would consider to be a low risk patient for low risk surgery.  She is on beta-blocker which is if she does have rapid A. fib perioperatively, we could use IV diltiazem for rate control and even for short-term amiodarone if necessary.  Would not continue amiodarone postop however.  Cardiology will be available if assistance is required perioperatively.      Relevant Orders   EKG 12-Lead (Completed)     I spent a total of 45 minutes with the patient and chart review. >  50% of the time was spent in direct patient consultation.  Cloa had multiple questions as did her sister who is here with her today.  They asked about this but every medicine she is on.  They asked about preop questions they asked about heart failure questions they asked about A. fib again.  Multiple questions that have been previously answered.  It took quite a bit of time.  Current medicines are reviewed at length with the patient today.  (+/- concerns)  -why is my heart rate going faster The following changes have been made:  See below  Patient Instructions  Medication Instructions:   START DILTIAZEM CD 180 MG -TAKE AT BEDTIME - ONE CAPSULE  MAY HAVE SURGERY ATER JAN 1 ,2020- MAYBE ABLE TO STOP PLAVIX 5 DAYS PRIOR TO SURGERY PREFERABLE CONTINUE TAKING ASPIRIN 81 MG. SEE LETTER. If you need a refill on your cardiac medications before your next appointment, please call your pharmacy.   Lab work: NOT NEEDED If you have labs (blood work) drawn today and your tests are completely normal, you will receive your results only by: Marland Kitchen MyChart Message (if you have MyChart) OR . A paper copy in the mail If you have any lab test that is abnormal or we need to change your treatment, we will call you to review the results.  Testing/Procedures: NOT NEEDED  Follow-Up: At [facility], you and your health needs are our priority.  As part of our continuing mission to provide you with exceptional heart care, we have created designated Provider Care Teams.  These Care Teams include your primary Cardiologist (physician) and Advanced Practice Providers (APPs -  Physician Assistants and Nurse Practitioners) who all work together to provide you with the care you need, when you need it. You will need a follow up appointment in 2 months -- END OF FEB 2020.  Please call our office 2 months in advance to schedule this appointment.  You may see Glenetta Hew, MD or one of the following Advanced Practice Providers on your designated Care Team:   Rosaria Ferries, PA-C . Jory Sims, DNP, ANP  Any Other Special Instructions Will Be Listed Below (If Applicable). NOT NEEDED     Studies Ordered:   Orders Placed This Encounter  Procedures  . EKG 12-Lead      Glenetta Hew, M.D., M.S. Interventional Cardiologist   Pager # [phone] Phone # [phone] [address] [place],  [address]   Thank you for choosing Heartcare at [facility]!!

## 2018-05-23 ENCOUNTER — Telehealth (INDEPENDENT_AMBULATORY_CARE_PROVIDER_SITE_OTHER): Payer: Self-pay | Admitting: Orthopaedic Surgery

## 2018-05-23 ENCOUNTER — Encounter: Payer: Self-pay | Admitting: Cardiology

## 2018-05-23 NOTE — Assessment & Plan Note (Signed)
Borderline rate control.  On fortunately, we have tried to increase her up to 18.7 mg carvedilol, but that has not happened.  She never did increase the dose.  We did start her on low-dose digoxin, but is still having heart rate episodes that are quite fast.  She does have blood pressure room to add additional medication.  Plan: Add diltiazem 100 mg p.o. for additional rate control, especially periprocedural.  Can reassess her postoperatively in order to determine if she requires it long-term. Has refused oral anticoagulation in the past.  Continuing aspirin and Plavix, especially in light of her recent peripheral vascular procedure.  Would be okay as of January 1 to hold Plavix (preferably stay on aspirin if possible)

## 2018-05-23 NOTE — Telephone Encounter (Signed)
Received cardiac clearance for patient to have Left total knee surgery after 06-19-2018.  She will talk with her son tonight and get back with a date in January.    cb  [phone]

## 2018-05-23 NOTE — Assessment & Plan Note (Signed)
Relatively stable.  Seems euvolemic.  Not having any need to take additional dose of furosemide.  Quite often she does not take both doses.

## 2018-05-23 NOTE — Assessment & Plan Note (Signed)
Status post peripheral vascular intervention of left SFA and popliteal artery.  This should allow for her knee to heal with upcoming surgery. Plan per Dr. Gwenlyn Found was 3 months of aspirin and Plavix, at which time we can stop hopefully just Plavix for short time preoperatively and restart postop.

## 2018-05-23 NOTE — Telephone Encounter (Signed)
OK - thanks

## 2018-05-23 NOTE — Assessment & Plan Note (Signed)
Stable blood pressure today on current dose of carvedilol

## 2018-05-23 NOTE — Telephone Encounter (Signed)
fyi

## 2018-05-23 NOTE — Assessment & Plan Note (Signed)
She really does not have any true active cardiac issues besides her A. fib.  She does not have history of CAD.  No diabetes or renal function noted.  No stroke history.  Knee surgery would be considered low risk from a cardiac standpoint. Would like to see better rate control perioperatively.  We will start low-dose diltiazem XT 180 mg p.o --> may actually discontinue on that dose, but can reassess postop.  Would consider to be a low risk patient for low risk surgery.  She is on beta-blocker which is if she does have rapid A. fib perioperatively, we could use IV diltiazem for rate control and even for short-term amiodarone if necessary.  Would not continue amiodarone postop however.  Cardiology will be available if assistance is required perioperatively.

## 2018-05-24 NOTE — Progress Notes (Signed)
Cardiology clearance.

## 2018-06-17 DIAGNOSIS — J302 Other seasonal allergic rhinitis: Secondary | ICD-10-CM | POA: Diagnosis not present

## 2018-06-17 DIAGNOSIS — I4891 Unspecified atrial fibrillation: Secondary | ICD-10-CM | POA: Diagnosis not present

## 2018-06-26 ENCOUNTER — Inpatient Hospital Stay (INDEPENDENT_AMBULATORY_CARE_PROVIDER_SITE_OTHER): Payer: Medicare HMO | Admitting: Surgery

## 2018-06-27 NOTE — Pre-Procedure Instructions (Signed)
Sara [name]  06/27/2018      [address] [place], [place] [address] Phone: [phone] Fax: [phone]    Your procedure is scheduled on January 20  Report to [facility] at 1030 A.M.  Call this number if you have problems the morning of surgery:  [phone]   Remember:  Do not eat or drink after midnight.    Take these medicines the morning of surgery with A SIP OF WATER  carvedilol (COREG)  digoxin (LANOXIN)  Follow physicians instructions regarding your plavix  7 days prior to surgery STOP taking any Aspirin (unless otherwise instructed by your surgeon), Aleve, Naproxen, Ibuprofen, Motrin, Advil, Goody's, BC's, all herbal medications, fish oil, and all vitamins.  Follow your surgeon's instructions on when to stop Asprin.  If no instructions were given by your surgeon then you will need to call the office to get those instructions.       Do not wear jewelry, make-up or nail polish.  Do not wear lotions, powders, or perfumes, or deodorant.  Do not shave 48 hours prior to surgery.   Do not bring valuables to the hospital.  [facility] is not responsible for any belongings or valuables.  Contacts, dentures or bridgework may not be worn into surgery.  Leave your suitcase in the car.  After surgery it may be brought to your room.  For patients admitted to the hospital, discharge time will be determined by your treatment team.  Patients discharged the day of surgery will not be allowed to drive home.    Special instructions:   [place]- Preparing For Surgery  Before surgery, you can play an important role. Because skin is not sterile, your skin needs to be as free of germs as possible. You can reduce the number of germs on your skin by washing with CHG (chlorahexidine gluconate) Soap before surgery.  CHG is an antiseptic cleaner which kills germs and bonds with the skin to continue  killing germs even after washing.    Oral Hygiene is also important to reduce your risk of infection.  Remember - BRUSH YOUR TEETH THE MORNING OF SURGERY WITH YOUR REGULAR TOOTHPASTE  Please do not use if you have an allergy to CHG or antibacterial soaps. If your skin becomes reddened/irritated stop using the CHG.  Do not shave (including legs and underarms) for at least 48 hours prior to first CHG shower. It is OK to shave your face.  Please follow these instructions carefully.   1. Shower the NIGHT BEFORE SURGERY and the MORNING OF SURGERY with CHG.   2. If you chose to wash your hair, wash your hair first as usual with your normal shampoo.  3. After you shampoo, rinse your hair and body thoroughly to remove the shampoo.  4. Use CHG as you would any other liquid soap. You can apply CHG directly to the skin and wash gently with a scrungie or a clean washcloth.   5. Apply the CHG Soap to your body ONLY FROM THE NECK DOWN.  Do not use on open wounds or open sores. Avoid contact with your eyes, ears, mouth and genitals (private parts). Wash Face and genitals (private parts)  with your normal soap.  6. Wash thoroughly, paying special attention to the area where your surgery will be performed.  7. Thoroughly rinse your body with warm water from the neck down.  8.  DO NOT shower/wash with your normal soap after using and rinsing off the CHG Soap.  9. Pat yourself dry with a CLEAN TOWEL.  10. Wear CLEAN PAJAMAS to bed the night before surgery, wear comfortable clothes the morning of surgery  11. Place CLEAN SHEETS on your bed the night of your first shower and DO NOT SLEEP WITH PETS.    Day of Surgery:  Do not apply any deodorants/lotions.  Please wear clean clothes to the hospital/surgery center.   Remember to brush your teeth WITH YOUR REGULAR TOOTHPASTE.    Please read over the following fact sheets that you were given.

## 2018-06-28 ENCOUNTER — Encounter (HOSPITAL_COMMUNITY): Payer: Self-pay

## 2018-06-28 ENCOUNTER — Other Ambulatory Visit: Payer: Self-pay

## 2018-06-28 ENCOUNTER — Encounter (HOSPITAL_COMMUNITY)
Admission: RE | Admit: 2018-06-28 | Discharge: 2018-06-28 | Disposition: A | Discharge status: Home / Self Care | Payer: Medicare HMO | Source: Ambulatory Visit | Attending: Orthopaedic Surgery | Admitting: Orthopaedic Surgery

## 2018-06-28 DIAGNOSIS — Z01812 Encounter for preprocedural laboratory examination: Secondary | ICD-10-CM | POA: Insufficient documentation

## 2018-06-28 HISTORY — DX: Cardiac arrhythmia, unspecified: I49.9

## 2018-06-28 LAB — COMPREHENSIVE METABOLIC PANEL
ALT: 16 U/L (ref 0–44)
AST: 23 U/L (ref 15–41)
Albumin: 3.9 g/dL (ref 3.5–5.0)
Alkaline Phosphatase: 71 U/L (ref 38–126)
Anion gap: 8 (ref 5–15)
BUN: 14 mg/dL (ref 8–23)
CO2: 24 mmol/L (ref 22–32)
Calcium: 9.6 mg/dL (ref 8.9–10.3)
Chloride: 108 mmol/L (ref 98–111)
Creatinine, Ser: 0.71 mg/dL (ref 0.44–1.00)
GFR calc Af Amer: >60 mL/min (ref >60)
Glucose, Bld: 95 mg/dL (ref 70–99)
Potassium: 4 mmol/L (ref 3.5–5.1)
Sodium: 140 mmol/L (ref 135–145)
Total Bilirubin: 1.1 mg/dL (ref 0.3–1.2)
Total Protein: 6.8 g/dL (ref 6.5–8.1)

## 2018-06-28 LAB — URINALYSIS, ROUTINE W REFLEX MICROSCOPIC
Bilirubin Urine: NEGATIVE
Glucose, UA: NEGATIVE mg/dL
Hgb urine dipstick: NEGATIVE
Ketones, ur: NEGATIVE mg/dL
Leukocytes, UA: NEGATIVE
Nitrite: NEGATIVE
Protein, ur: NEGATIVE mg/dL
Specific Gravity, Urine: 1.019 (ref 1.005–1.030)
pH: 6 (ref 5.0–8.0)

## 2018-06-28 LAB — SURGICAL PCR SCREEN
MRSA, PCR: NEGATIVE
Staphylococcus aureus: NEGATIVE

## 2018-06-28 LAB — CBC
HCT: 46.9 % — ABNORMAL HIGH (ref 36.0–46.0)
Hemoglobin: 15.2 g/dL — ABNORMAL HIGH (ref 12.0–15.0)
MCH: 29.3 pg (ref 26.0–34.0)
MCHC: 32.4 g/dL (ref 30.0–36.0)
MCV: 90.4 fL (ref 80.0–100.0)
Platelets: 215 10*3/uL (ref 150–400)
RBC: 5.19 MIL/uL — ABNORMAL HIGH (ref 3.87–5.11)
RDW: 14.5 % (ref 11.5–15.5)
WBC: 5.8 10*3/uL (ref 4.0–10.5)
nRBC: 0 % (ref 0.0–0.2)

## 2018-06-28 NOTE — Progress Notes (Signed)
PCP -  Cardiologist -  Dr Ellyn Hack  LOV 06/07/2018  Chest x-ray -   EKG - 05/22/2018 H/o CHF & A-Fib  Stress Test -  ECHO -  2017  Cardiac Cath - 2019  Sleep Study - n/a CPAP -   Fasting Blood Sugar -N/A Checks Blood Sugar _____ times a day  Blood Thinner Instructions: Stopping plavix 5 days before (as directed by cardio) Aspirin Instructions:(as instructed by her cardio)  Anesthesia review:   Patient denies shortness of breath, fever, cough and chest pain at PAT appointment   Patient verbalized understanding of instructions that were given to them at the PAT appointment. Patient was also instructed that they will need to review over the PAT instructions again at home before surgery.

## 2018-06-28 NOTE — Pre-Procedure Instructions (Signed)
Sara [name]  06/28/2018      [place], [address], [place] [address] Phone: [phone] Fax: [phone]    Your procedure is scheduled on Monday, January 20th   Report to [facility] at 1030 A.M.  Call this number if you have problems the morning of surgery:  [phone]   Remember:  Do not eat or drink after midnight.    Take these medicines the morning of surgery with A SIP OF WATER  carvedilol (COREG)  digoxin (LANOXIN)  Follow physicians instructions regarding your plavix  7 days prior to surgery STOP taking any Aspirin (unless otherwise instructed by your surgeon), Aleve, Naproxen, Ibuprofen, Motrin, Advil, Goody's, BC's, all herbal medications, fish oil, and all vitamins.  Follow your surgeon's instructions on when to stop Asprin.  If no instructions were given by your surgeon then you will need to call the office to get those instructions.       Do not wear jewelry, make-up or nail polish.  Do not wear lotions, powders, or perfumes, or deodorant.  Do not shave 48 hours prior to surgery.   Do not bring valuables to the hospital.  [facility] is not responsible for any belongings or valuables.  Contacts, dentures or bridgework may not be worn into surgery.  Leave your suitcase in the car.  After surgery it may be brought to your room.  For patients admitted to the hospital, discharge time will be determined by your treatment team.  Patients discharged the day of surgery will not be allowed to drive home.    Special instructions:   [facility]- Preparing For Surgery  Before surgery, you can play an important role. Because skin is not sterile, your skin needs to be as free of germs as possible. You can reduce the number of germs on your skin by washing with CHG (chlorahexidine gluconate) Soap before surgery.  CHG is an antiseptic cleaner which kills germs and bonds with the skin  to continue killing germs even after washing.    Oral Hygiene is also important to reduce your risk of infection.  Remember - BRUSH YOUR TEETH THE MORNING OF SURGERY WITH YOUR REGULAR TOOTHPASTE  Please do not use if you have an allergy to CHG or antibacterial soaps. If your skin becomes reddened/irritated stop using the CHG.  Do not shave (including legs and underarms) for at least 48 hours prior to first CHG shower. It is OK to shave your face.  Please follow these instructions carefully.   1. Shower the NIGHT BEFORE SURGERY and the MORNING OF SURGERY with CHG.   2. If you chose to wash your hair, wash your hair first as usual with your normal shampoo.  3. After you shampoo, rinse your hair and body thoroughly to remove the shampoo.  4. Use CHG as you would any other liquid soap. You can apply CHG directly to the skin and wash gently with a scrungie or a clean washcloth.   5. Apply the CHG Soap to your body ONLY FROM THE NECK DOWN.  Do not use on open wounds or open sores. Avoid contact with your eyes, ears, mouth and genitals (private parts). Wash Face and genitals (private parts)  with your normal soap.  6. Wash thoroughly, paying special attention to the area where your surgery will be performed.  7. Thoroughly rinse your body with warm water from the neck down.  8. DO NOT shower/wash with your normal soap after using and rinsing off the CHG Soap.  9. Pat yourself dry with a CLEAN TOWEL.  10. Wear CLEAN PAJAMAS to bed the night before surgery, wear comfortable clothes the morning of surgery  11. Place CLEAN SHEETS on your bed the night of your first shower and DO NOT SLEEP WITH PETS.    Day of Surgery:  Do not apply any deodorants/lotions.  Please wear clean clothes to the hospital/surgery center.   Remember to brush your teeth WITH YOUR REGULAR TOOTHPASTE.    Please read over the following fact sheets that you were given.

## 2018-07-01 ENCOUNTER — Telehealth: Payer: Self-pay | Admitting: Cardiology

## 2018-07-01 NOTE — Anesthesia Preprocedure Evaluation (Deleted)
Anesthesia Evaluation    Airway        Dental   Pulmonary           Cardiovascular hypertension,   EKG: 05/22/18  Afib at 109 bpm. Right BBB. T wave abnormality, consider inferolateral ischemia.  CV: TEE 03/31/16: Study Conclusions - Left ventricle: Systolic function was normal. The estimated ejection fraction was in the range of 55% to 60%.  Nuclear stress test 07/27/10:    No significant ischemia demonstrated.  Post stress ejection fraction is 74%.  Global left ventricular systolic function is normal.  No significant wall motion abnormalities noted.  EKG is negative for ischemia. This is a low risk scan.    Neuro/Psych    GI/Hepatic   Endo/Other    Renal/GU      Musculoskeletal   Abdominal   Peds  Hematology   Anesthesia Other Findings   Reproductive/Obstetrics                                                             Anesthesia Evaluation  Patient identified by MRN, date of birth, ID band Patient awake    Reviewed: Allergy & Precautions, NPO status , Patient's Chart, lab work & pertinent test results, reviewed documented beta blocker date and time   History of Anesthesia Complications Negative for: history of anesthetic complications  Airway Mallampati: III  TM Distance: >3 FB Neck ROM: Full    Dental  (+) Dental Advisory Given   Pulmonary neg shortness of breath, neg sleep apnea, neg COPD, neg recent URI,  BOOP   Pulmonary exam normal breath sounds clear to auscultation       Cardiovascular hypertension, Pt. on home beta blockers and Pt. on medications (-) angina(-) Past MI, (-) Cardiac Stents and (-) CABG + dysrhythmias Atrial Fibrillation  Rhythm:Irregular Rate:Normal  HLD  Echo 03/30/16 Study Conclusions  - Left ventricle: The cavity size was normal. Wall thickness was increased in a pattern of mild LVH. Systolic function was vigorous. The  estimated ejection fraction was in the range of 65% to 70%. Wall motion was normal; there were no regional wall motion abnormalities. Doppler parameters are consistent with high ventricular filling pressure. - Aortic valve: There was trivial regurgitation. - Mitral valve: Calcified annulus. Mildly thickened leaflets . - Left atrium: The atrium was severely dilated. - Right atrium: The atrium was mildly dilated. - Pulmonary arteries: Systolic pressure was mildly increased. PA peak pressure: 37 mm Hg (S).  Impressions:  - Vigorous LV systolic function with intracavitary gradient of 1.7 m/s; mild LVH; elevated LV filling pressure; biatrial enlargement; trace AI; mild TR with mildly elevated pulmonary pressure.   Neuro/Psych neg Seizures (questionable seizures) PSYCHIATRIC DISORDERS Anxiety    GI/Hepatic Neg liver ROS, hiatal hernia, GERD  ,  Endo/Other  negative endocrine ROS  Renal/GU negative Renal ROS     Musculoskeletal  (+) Arthritis , Osteoarthritis,    Abdominal   Peds  Hematology  (+) Blood dyscrasia, anemia ,   Anesthesia Other Findings Right eye corneal edema, vortex keratopathy, syncope  Reproductive/Obstetrics                            Anesthesia Physical Anesthesia Plan  ASA: III  Anesthesia Plan: MAC  Post-op Pain Management:    Induction: Intravenous  Airway Management Planned: Natural Airway and Nasal Cannula  Additional Equipment:   Intra-op Plan:   Post-operative Plan:   Informed Consent: I have reviewed the patients History and Physical, chart, labs and discussed the procedure including the risks, benefits and alternatives for the proposed anesthesia with the patient or authorized representative who has indicated his/her understanding and acceptance.   Dental advisory given  Plan Discussed with: CRNA  Anesthesia Plan Comments:        Anesthesia Quick Evaluation  Anesthesia  Physical Anesthesia Plan  ASA: III  Anesthesia Plan: Spinal   Post-op Pain Management:  Regional for Post-op pain   Induction:   PONV Risk Score and Plan: 2 and Treatment may vary due to age or medical condition  Airway Management Planned:   Additional Equipment:   Intra-op Plan:   Post-operative Plan:   Informed Consent:   Plan Discussed with: CRNA, Anesthesiologist and Surgeon  Anesthesia Plan Comments: (PAT note written 07/01/2018 by Myra Gianotti, PA-C. )       Anesthesia Quick Evaluation

## 2018-07-01 NOTE — Telephone Encounter (Signed)
OK to hold Plavix for surgery  Glenetta Hew, MD

## 2018-07-01 NOTE — Telephone Encounter (Signed)
Request forwarded to [place] for approval.

## 2018-07-01 NOTE — Telephone Encounter (Signed)
Detailed message left on Debbie's @ Dr.Yates office secured voicemail. Note    OK to hold Plavix for surgery  Glenetta Hew, MD     She is to contact the office if any further is needed.

## 2018-07-01 NOTE — Progress Notes (Signed)
I spoke to Mrs. Sara [name] and instructed her to not take Plavix again until instructed to do so after surgery.  Patient reports that she took it this am, she repeated to not take any more Plavix after today.

## 2018-07-01 NOTE — Telephone Encounter (Signed)
Debbie from Dr. Lorin Mercy' office is calling wanting to know if the Plavix can be held for 7 days instead of 5. She can be reached at [phone] or [phone].   There was a letter sent on 05/22/18 stating is was ok to hold for 5 days.

## 2018-07-01 NOTE — Progress Notes (Signed)
Anesthesia Chart Review:  Case:  [id number] Date/Time:  07/08/18 1215   Procedure:  LEFT TOTAL KNEE ARTHROPLASTY CEMENTED (Left )   Anesthesia type:  Spinal   Pre-op diagnosis:  LEFT KNEE OA   Location:  [facility] OR ROOM 05 / [facility]   Surgeon:  Marybelle Killings, MD      DISCUSSION: Patient is a 85 year old female scheduled for the above procedure.  History includes never smoker, afib (DCCV 03/31/16; patient declined anticoagulation due to bruising and fall risk), chronic diastolic CHF, HTN, PAD (s/p DES left SFA, DES left above-knee popliteal artery 03/18/18), GERD, hiatal hernia, HLD, syncope 2012, hard of hearing, breast cancer (s/p right breast lumpectomy 2014). 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 She was treated with steroids, but amiodarone discontinued to to concern for possible amiodarone toxicity.  Cardiologist Dr. Ellyn Hack initially saw her for preoperative evaluation in August 2019, but she was noted to have decreased peripheral pulses so LE Dopplers ordered which showed decreased ABI on the left. Dr. Gwenlyn Found placed left SFA and popliteal stents on 03/18/18 with permission to hold Plavix for surgery starting after 06/19/18. Dr. Ellyn Hack saw her again for follow-up and preoperative risk assessment on 05/22/18 (see below). Diltiazem added for rate control. He preferred for patient to stay on perioperative ASA if possible.  "Preop cardiovascular exam  She really does not have any true active cardiac issues besides her A. fib.  She does not have history of CAD.  No diabetes or renal function noted.  No stroke history.  Knee surgery would be considered low risk from a cardiac standpoint. Would like to see better rate control perioperatively.  We will start low-dose diltiazem XT 180 mg p.o --> may actually discontinue on that dose, but can reassess postop.  Would  consider to be a low risk patient for low risk surgery.  She is on beta-blocker which is if she does have rapid A. fib perioperatively, we could use IV diltiazem for rate control and even for short-term amiodarone if necessary.  Would not continue amiodarone postop however.  Cardiology will be available if assistance is required perioperatively."   Heart rate was 80 bpm at PAT. She reported she was instructed to hold Plavix for 5 days prior to surgery. Debbie at Dr. Lorin Mercy' office contacted [facility] to inquire if okay to hold Plavix for 7 days (given case is posted for spinal anesthesia). Communication in [place] reflects this to which Dr. Ellyn Hack replied "OK to hold Plavix for surgery." I asked our staff to notify patient and will follow-up with Debbie. Patient reported last dose of Plavix 07/01/18 AM. Surgery is scheduled for 07/08/18 PM.  Based on currently available information I would anticipate that she can proceed as planned if no acute changes.   VS: BP (!) 175/83   Pulse 80   Temp (!) 36.4 C   Resp 20   Ht 5' (1.524 m)   Wt 67.1 kg   SpO2 97%   BMI 28.90 kg/m   PROVIDERS: Townsend Roger, MD is PCP Glenetta Hew, MD is cardiologist. Last visit 05/22/18.  Quay Burow, MD is PV cardiologist. Curt Bears, Will, MD is EP cardiologist. Patient's metoprolol and diltiazem discontinued due to patient concerns they were causing peripheral edema. He started her on carvedilol.   LABS: Labs reviewed: Acceptable for surgery. (all labs ordered are listed, but only abnormal  results are displayed)  Labs Reviewed  CBC - Abnormal; Notable for the following components:      Result Value   RBC 5.19 (*)    Hemoglobin 15.2 (*)    HCT 46.9 (*)    All other components within normal limits  SURGICAL PCR SCREEN  COMPREHENSIVE METABOLIC PANEL  URINALYSIS, ROUTINE W REFLEX MICROSCOPIC    EKG: 05/22/18 ([facility]): Afib at 109 bpm. Right BBB. T wave abnormality, consider inferolateral  ischemia. Overall, I think tracing is stable when compared to 01/17/18 EKG.   CV: TEE 03/31/16: Study Conclusions - Left ventricle: Systolic function was normal. The estimated   ejection fraction was in the range of 55% to 60%. - Aortic valve: No evidence of vegetation. - Mitral valve: No evidence of vegetation. There was mild   regurgitation. - Left atrium: The atrium was mildly dilated. No evidence of   thrombus in the appendage. No evidence of thrombus in the   appendage. - Right atrium: No evidence of thrombus in the atrial cavity or   appendage. - Tricuspid valve: No evidence of vegetation. There was mild   regurgitation. - Pulmonic valve: No evidence of vegetation. - Superior vena cava: The study excluded a thrombus.  Nuclear stress test 07/27/10: Abnormal myocardial perfusion scan demonstrating an attenuation defect in the inferior region of the myocardium.  Perfusion defect in the inferior myocardium region is consistent with diaphragmatic attenuation.  The remaining myocardium demonstrates normal myocardial perfusion with no evidence of ischemia or infarct.  No prior study available for comparison.  No significant ischemia demonstrated.  Post stress ejection fraction is 74%.  Global left ventricular systolic function is normal.  No significant wall motion abnormalities noted.  EKG is negative for ischemia. This is a low risk scan.   Past Medical History:  Diagnosis Date  . Ankle edema    Mild, which is intermittent, usually mildly dependent, left slightly greater than the right.  . Anxiety   . Arthritis    In her knees  . Balance problem    Due to weakened knee  . Chronic diastolic heart failure (HCC)    Class I-II -- exacerbated by Afib RVR  . Corneal edema    Severe corneal edema in right eye with multiple folds. Developed after cataract surgery 05/03/09  . Dysrhythmia    a-fib    since aghe 85 yrs old  . GERD (gastroesophageal reflux disease)   . Hematuria     Resolved after coumadin was stopped  . Hiatal hernia    With GERD  . History of breast cancer   . HOH (hard of hearing)   . Hyperglycemia    Random, without any history of diabetes  . Hyperlipidemia   . Hypertension    Difficult to control. Renal Doppler 06/02/10 showed less that 60% bilateral renal artery narrowing.  . Hypokalemia   . PAD (peripheral artery disease) ([place])   . Permanent atrial fibrillation    Rate control.  ASA.  NOT on DOAC or warfarin due to concern for falls &bleeding.  Marland Kitchen Posthemorrhagic anemia   . Pseudophakia   . Syncope 02/22/11   During OV on 02/22/11 her INR was 7.3 and was given 2.5 mg of vitamin K. Later that day she became diaphoretic & fainted.  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 ABDOMINAL HYSTERECTOMY    . BREAST BIOPSY     3 biopsies  .  CARDIOVERSION N/A 03/31/2016   Procedure: CARDIOVERSION;  Surgeon: Jerline Pain, MD;  Location: [facility] ENDOSCOPY;  Service: Cardiovascular: Successful cardioversion from A. fib to sinus rhythm  . CATARACT EXTRACTION  05/03/09  . CHOLECYSTECTOMY    . EYE SURGERY     cataract  . HEMORRHOID SURGERY    . LOWER EXTREMITY ANGIOGRAPHY Right 03/18/2018   Procedure: LOWER EXTREMITY ANGIOGRAPHY;  Surgeon: Lorretta Harp, MD;  Location: [place] CV LAB;  Service: Cardiovascular;  Laterality: Right;  . PERIPHERAL VASCULAR INTERVENTION  03/18/2018   Procedure: PERIPHERAL VASCULAR INTERVENTION;  Surgeon: Lorretta Harp, MD;  Location: [place] CV LAB;  Service: Cardiovascular;;  left SFA  . Remote hysterectomy    . TEE WITHOUT CARDIOVERSION N/A 03/31/2016   Procedure: TRANSESOPHAGEAL ECHOCARDIOGRAM (TEE);  Surgeon: Jerline Pain, MD;  Location: [place];  Service: Cardiovascular:  EF 55-60%. No vegetation or thrombus okay for DC CV  . TONSILLECTOMY    . TOTAL KNEE ARTHROPLASTY Right 08/26/10   By Dr. Elta Guadeloupe C. Yates. Cemented, computer assist  .  TRANSTHORACIC ECHOCARDIOGRAM  03/2016    Mild LVH. EF 65-70%. High LVEDP no RWMA. Severe LA dilation. Peak PAP ~ 37 mmHg    MEDICATIONS: . aspirin EC 81 MG tablet  . carvedilol (COREG) 12.5 MG tablet  . clopidogrel (PLAVIX) 75 MG tablet  . digoxin (LANOXIN) 0.125 MG tablet  . diltiazem (CARDIZEM CD) 180 MG 24 hr capsule  . furosemide (LASIX) 40 MG tablet  . NON FORMULARY  . potassium chloride SA (K-DUR,KLOR-CON) 20 MEQ tablet  . rOPINIRole (REQUIP) 1 MG tablet   No current facility-administered medications for this encounter.     Myra Gianotti, PA-C Surgical Short Stay/Anesthesiology [facility] Phone [phone] [facility] Phone [phone] 07/01/2018 9:11 PM

## 2018-07-08 ENCOUNTER — Ambulatory Visit (HOSPITAL_COMMUNITY): Payer: Medicare HMO | Admitting: Anesthesiology

## 2018-07-08 ENCOUNTER — Inpatient Hospital Stay (HOSPITAL_COMMUNITY)
Admission: AD | Admit: 2018-07-08 | Discharge: 2018-07-11 | DRG: 470 | Disposition: A | Discharge status: Skilled Nursing Facility | Payer: Medicare HMO | Attending: Orthopaedic Surgery | Admitting: Orthopaedic Surgery

## 2018-07-08 ENCOUNTER — Encounter (HOSPITAL_COMMUNITY)
Admission: AD | Disposition: A | Discharge status: Skilled Nursing Facility | Payer: Self-pay | Source: Home / Self Care | Attending: Orthopaedic Surgery

## 2018-07-08 ENCOUNTER — Ambulatory Visit (HOSPITAL_COMMUNITY): Payer: Medicare HMO | Admitting: Physician Assistant

## 2018-07-08 ENCOUNTER — Ambulatory Visit (HOSPITAL_COMMUNITY): Payer: Medicare HMO

## 2018-07-08 ENCOUNTER — Encounter (HOSPITAL_COMMUNITY): Payer: Self-pay

## 2018-07-08 ENCOUNTER — Other Ambulatory Visit: Payer: Self-pay

## 2018-07-08 DIAGNOSIS — Z90710 Acquired absence of both cervix and uterus: Secondary | ICD-10-CM

## 2018-07-08 DIAGNOSIS — H9190 Unspecified hearing loss, unspecified ear: Secondary | ICD-10-CM | POA: Diagnosis present

## 2018-07-08 DIAGNOSIS — Z961 Presence of intraocular lens: Secondary | ICD-10-CM | POA: Diagnosis present

## 2018-07-08 DIAGNOSIS — Z800 Family history of malignant neoplasm of digestive organs: Secondary | ICD-10-CM

## 2018-07-08 DIAGNOSIS — R001 Bradycardia, unspecified: Secondary | ICD-10-CM | POA: Diagnosis not present

## 2018-07-08 DIAGNOSIS — G8918 Other acute postprocedural pain: Secondary | ICD-10-CM | POA: Diagnosis not present

## 2018-07-08 DIAGNOSIS — Z09 Encounter for follow-up examination after completed treatment for conditions other than malignant neoplasm: Secondary | ICD-10-CM

## 2018-07-08 DIAGNOSIS — Z9849 Cataract extraction status, unspecified eye: Secondary | ICD-10-CM

## 2018-07-08 DIAGNOSIS — K219 Gastro-esophageal reflux disease without esophagitis: Secondary | ICD-10-CM | POA: Diagnosis present

## 2018-07-08 DIAGNOSIS — Z96652 Presence of left artificial knee joint: Secondary | ICD-10-CM | POA: Diagnosis not present

## 2018-07-08 DIAGNOSIS — I482 Chronic atrial fibrillation, unspecified: Secondary | ICD-10-CM | POA: Diagnosis not present

## 2018-07-08 DIAGNOSIS — M1712 Unilateral primary osteoarthritis, left knee: Secondary | ICD-10-CM | POA: Diagnosis present

## 2018-07-08 DIAGNOSIS — I4821 Permanent atrial fibrillation: Secondary | ICD-10-CM | POA: Diagnosis present

## 2018-07-08 DIAGNOSIS — Z471 Aftercare following joint replacement surgery: Secondary | ICD-10-CM | POA: Diagnosis not present

## 2018-07-08 DIAGNOSIS — E785 Hyperlipidemia, unspecified: Secondary | ICD-10-CM | POA: Diagnosis present

## 2018-07-08 DIAGNOSIS — I5032 Chronic diastolic (congestive) heart failure: Secondary | ICD-10-CM | POA: Diagnosis present

## 2018-07-08 DIAGNOSIS — Z7902 Long term (current) use of antithrombotics/antiplatelets: Secondary | ICD-10-CM | POA: Diagnosis not present

## 2018-07-08 DIAGNOSIS — Z79899 Other long term (current) drug therapy: Secondary | ICD-10-CM

## 2018-07-08 DIAGNOSIS — Z8249 Family history of ischemic heart disease and other diseases of the circulatory system: Secondary | ICD-10-CM | POA: Diagnosis not present

## 2018-07-08 DIAGNOSIS — M25762 Osteophyte, left knee: Secondary | ICD-10-CM | POA: Diagnosis present

## 2018-07-08 DIAGNOSIS — Z7982 Long term (current) use of aspirin: Secondary | ICD-10-CM

## 2018-07-08 DIAGNOSIS — I110 Hypertensive heart disease with heart failure: Secondary | ICD-10-CM | POA: Diagnosis present

## 2018-07-08 DIAGNOSIS — Z853 Personal history of malignant neoplasm of breast: Secondary | ICD-10-CM | POA: Diagnosis not present

## 2018-07-08 DIAGNOSIS — M21162 Varus deformity, not elsewhere classified, left knee: Secondary | ICD-10-CM | POA: Diagnosis present

## 2018-07-08 DIAGNOSIS — M2550 Pain in unspecified joint: Secondary | ICD-10-CM | POA: Diagnosis not present

## 2018-07-08 DIAGNOSIS — Z7401 Bed confinement status: Secondary | ICD-10-CM | POA: Diagnosis not present

## 2018-07-08 DIAGNOSIS — Z9049 Acquired absence of other specified parts of digestive tract: Secondary | ICD-10-CM | POA: Diagnosis not present

## 2018-07-08 DIAGNOSIS — I739 Peripheral vascular disease, unspecified: Secondary | ICD-10-CM | POA: Diagnosis present

## 2018-07-08 DIAGNOSIS — Z888 Allergy status to other drugs, medicaments and biological substances status: Secondary | ICD-10-CM | POA: Diagnosis not present

## 2018-07-08 DIAGNOSIS — I10 Essential (primary) hypertension: Secondary | ICD-10-CM

## 2018-07-08 DIAGNOSIS — Z96651 Presence of right artificial knee joint: Secondary | ICD-10-CM | POA: Diagnosis present

## 2018-07-08 DIAGNOSIS — I480 Paroxysmal atrial fibrillation: Secondary | ICD-10-CM | POA: Diagnosis not present

## 2018-07-08 DIAGNOSIS — E876 Hypokalemia: Secondary | ICD-10-CM | POA: Diagnosis not present

## 2018-07-08 HISTORY — PX: TOTAL KNEE ARTHROPLASTY: SHX125

## 2018-07-08 LAB — BASIC METABOLIC PANEL
Anion gap: 9 (ref 5–15)
BUN: 13 mg/dL (ref 8–23)
CO2: 22 mmol/L (ref 22–32)
Calcium: 8.5 mg/dL — ABNORMAL LOW (ref 8.9–10.3)
Chloride: 110 mmol/L (ref 98–111)
Creatinine, Ser: 0.66 mg/dL (ref 0.44–1.00)
GFR calc Af Amer: >60 mL/min (ref >60)
Glucose, Bld: 105 mg/dL — ABNORMAL HIGH (ref 70–99)
Potassium: 3.9 mmol/L (ref 3.5–5.1)
Sodium: 141 mmol/L (ref 135–145)

## 2018-07-08 LAB — TROPONIN I

## 2018-07-08 LAB — CBC
HCT: 38 % (ref 36.0–46.0)
Hemoglobin: 12.6 g/dL (ref 12.0–15.0)
MCH: 29.1 pg (ref 26.0–34.0)
MCHC: 33.2 g/dL (ref 30.0–36.0)
MCV: 87.8 fL (ref 80.0–100.0)
PLATELETS: 175 10*3/uL (ref 150–400)
RBC: 4.33 MIL/uL (ref 3.87–5.11)
RDW: 14.6 % (ref 11.5–15.5)
WBC: 9.9 10*3/uL (ref 4.0–10.5)
nRBC: 0 % (ref 0.0–0.2)

## 2018-07-08 LAB — DIGOXIN LEVEL: Digoxin Level: 0.5 ng/mL — ABNORMAL LOW (ref 0.8–2.0)

## 2018-07-08 SURGERY — ARTHROPLASTY, KNEE, TOTAL
Anesthesia: Spinal | Laterality: Left

## 2018-07-08 MED ORDER — BUPIVACAINE LIPOSOME 1.3 % IJ SUSP
20.0000 mL | INTRAMUSCULAR | Status: AC
  Administered 2018-07-08: 266 mg
  Administered 2018-07-08: 20 mL
  Filled 2018-07-08: qty 20

## 2018-07-08 MED ORDER — ONDANSETRON HCL 4 MG/2ML IJ SOLN
INTRAMUSCULAR | Status: DC | PRN
  Administered 2018-07-08: 4 mg via INTRAVENOUS

## 2018-07-08 MED ORDER — FENTANYL CITRATE (PF) 100 MCG/2ML IJ SOLN
INTRAMUSCULAR | Status: AC
  Administered 2018-07-08: 50 ug via INTRAVENOUS
  Filled 2018-07-08: qty 2

## 2018-07-08 MED ORDER — MENTHOL 3 MG MT LOZG: 1.0000 | LOZENGE | OROMUCOSAL | Status: DC | PRN

## 2018-07-08 MED ORDER — POLYETHYLENE GLYCOL 3350 17 G PO PACK: 17.0000 g | PACK | Freq: Every day | ORAL | Status: DC | PRN

## 2018-07-08 MED ORDER — PHENYLEPHRINE HCL 10 MG/ML IJ SOLN
INTRAMUSCULAR | Status: DC | PRN
  Administered 2018-07-08 (×2): 80 ug via INTRAVENOUS

## 2018-07-08 MED ORDER — FUROSEMIDE 40 MG PO TABS
40.0000 mg | ORAL_TABLET | Freq: Two times a day (BID) | ORAL | Status: DC
  Administered 2018-07-08 – 2018-07-10 (×4): 40 mg via ORAL
  Filled 2018-07-08 (×4): qty 1

## 2018-07-08 MED ORDER — 0.9 % SODIUM CHLORIDE (POUR BTL) OPTIME
TOPICAL | Status: DC | PRN
  Administered 2018-07-08: 1000 mL

## 2018-07-08 MED ORDER — ONDANSETRON HCL 4 MG/2ML IJ SOLN
4.0000 mg | Freq: Four times a day (QID) | INTRAMUSCULAR | Status: DC | PRN
  Filled 2018-07-08: qty 2

## 2018-07-08 MED ORDER — ROPIVACAINE HCL 7.5 MG/ML IJ SOLN
INTRAMUSCULAR | Status: DC | PRN
  Administered 2018-07-08: 20 mL via PERINEURAL

## 2018-07-08 MED ORDER — MIDAZOLAM HCL 2 MG/2ML IJ SOLN
INTRAMUSCULAR | Status: AC
  Filled 2018-07-08: qty 2

## 2018-07-08 MED ORDER — BUPIVACAINE HCL (PF) 0.25 % IJ SOLN
INTRAMUSCULAR | Status: AC
  Filled 2018-07-08: qty 30

## 2018-07-08 MED ORDER — DIGOXIN 250 MCG PO TABS: 0.2500 mg | ORAL_TABLET | Freq: Every day | ORAL | Status: DC

## 2018-07-08 MED ORDER — ONDANSETRON HCL 4 MG PO TABS
4.0000 mg | ORAL_TABLET | Freq: Four times a day (QID) | ORAL | Status: DC | PRN
  Administered 2018-07-11: 4 mg via ORAL
  Filled 2018-07-08: qty 1

## 2018-07-08 MED ORDER — SODIUM CHLORIDE 0.9 % IV SOLN
INTRAVENOUS | Status: DC
  Administered 2018-07-08 – 2018-07-09 (×2): via INTRAVENOUS

## 2018-07-08 MED ORDER — METOCLOPRAMIDE HCL 5 MG/ML IJ SOLN: 5.0000 mg | Freq: Three times a day (TID) | INTRAMUSCULAR | Status: DC | PRN

## 2018-07-08 MED ORDER — LACTATED RINGERS IV SOLN
INTRAVENOUS | Status: DC
  Administered 2018-07-08 (×2): via INTRAVENOUS

## 2018-07-08 MED ORDER — ACETAMINOPHEN 325 MG PO TABS
325.0000 mg | ORAL_TABLET | Freq: Four times a day (QID) | ORAL | Status: DC | PRN
  Administered 2018-07-08: 650 mg via ORAL
  Filled 2018-07-08 (×2): qty 2

## 2018-07-08 MED ORDER — BUPIVACAINE IN DEXTROSE 0.75-8.25 % IT SOLN
INTRATHECAL | Status: DC | PRN
  Administered 2018-07-08: 1.5 mL via INTRATHECAL

## 2018-07-08 MED ORDER — FENTANYL CITRATE (PF) 100 MCG/2ML IJ SOLN
50.0000 ug | Freq: Once | INTRAMUSCULAR | Status: AC
  Administered 2018-07-08: 50 ug via INTRAVENOUS

## 2018-07-08 MED ORDER — FENTANYL CITRATE (PF) 100 MCG/2ML IJ SOLN: 25.0000 ug | INTRAMUSCULAR | Status: DC | PRN

## 2018-07-08 MED ORDER — METOCLOPRAMIDE HCL 5 MG PO TABS: 5.0000 mg | ORAL_TABLET | Freq: Three times a day (TID) | ORAL | Status: DC | PRN

## 2018-07-08 MED ORDER — LIDOCAINE HCL (CARDIAC) PF 100 MG/5ML IV SOSY
PREFILLED_SYRINGE | INTRAVENOUS | Status: DC | PRN
  Administered 2018-07-08: 60 mg via INTRAVENOUS

## 2018-07-08 MED ORDER — PROPOFOL 10 MG/ML IV BOLUS
INTRAVENOUS | Status: DC | PRN
  Administered 2018-07-08: 20 mg via INTRAVENOUS

## 2018-07-08 MED ORDER — CHLORHEXIDINE GLUCONATE 4 % EX LIQD: 60.0000 mL | Freq: Once | CUTANEOUS | Status: DC

## 2018-07-08 MED ORDER — CLOPIDOGREL BISULFATE 75 MG PO TABS
75.0000 mg | ORAL_TABLET | Freq: Every day | ORAL | Status: DC
  Administered 2018-07-09 – 2018-07-11 (×3): 75 mg via ORAL
  Filled 2018-07-08 (×3): qty 1

## 2018-07-08 MED ORDER — CLONIDINE HCL (ANALGESIA) 100 MCG/ML EP SOLN: EPIDURAL | Status: DC | PRN

## 2018-07-08 MED ORDER — CEFAZOLIN SODIUM-DEXTROSE 2-4 GM/100ML-% IV SOLN
2.0000 g | INTRAVENOUS | Status: AC
  Administered 2018-07-08: 2 g via INTRAVENOUS
  Filled 2018-07-08: qty 100

## 2018-07-08 MED ORDER — PROPOFOL 500 MG/50ML IV EMUL
INTRAVENOUS | Status: DC | PRN
  Administered 2018-07-08: 75 ug/kg/min via INTRAVENOUS

## 2018-07-08 MED ORDER — HYDROCODONE-ACETAMINOPHEN 5-325 MG PO TABS
1.0000 | ORAL_TABLET | Freq: Four times a day (QID) | ORAL | Status: DC | PRN
  Administered 2018-07-08 – 2018-07-11 (×8): 1 via ORAL
  Filled 2018-07-08 (×8): qty 1

## 2018-07-08 MED ORDER — ROPINIROLE HCL 1 MG PO TABS
1.0000 mg | ORAL_TABLET | Freq: Every day | ORAL | Status: DC
  Administered 2018-07-08 – 2018-07-11 (×4): 1 mg via ORAL
  Filled 2018-07-08 (×4): qty 1

## 2018-07-08 MED ORDER — SODIUM CHLORIDE 0.9 % IV SOLN
INTRAVENOUS | Status: DC | PRN
  Administered 2018-07-08: 40 ug/min via INTRAVENOUS

## 2018-07-08 MED ORDER — CARVEDILOL 12.5 MG PO TABS: 12.5000 mg | ORAL_TABLET | Freq: Two times a day (BID) | ORAL | Status: DC

## 2018-07-08 MED ORDER — BUPIVACAINE HCL (PF) 0.25 % IJ SOLN
INTRAMUSCULAR | Status: DC | PRN
  Administered 2018-07-08: 30 mL

## 2018-07-08 MED ORDER — SODIUM CHLORIDE 0.9 % IR SOLN
Status: DC | PRN
  Administered 2018-07-08: 3000 mL

## 2018-07-08 MED ORDER — POTASSIUM CHLORIDE CRYS ER 20 MEQ PO TBCR
40.0000 meq | EXTENDED_RELEASE_TABLET | Freq: Two times a day (BID) | ORAL | Status: DC
  Administered 2018-07-09 – 2018-07-10 (×3): 40 meq via ORAL
  Filled 2018-07-08 (×4): qty 2

## 2018-07-08 MED ORDER — FENTANYL CITRATE (PF) 250 MCG/5ML IJ SOLN
INTRAMUSCULAR | Status: AC
  Filled 2018-07-08: qty 5

## 2018-07-08 MED ORDER — ASPIRIN EC 325 MG PO TBEC
325.0000 mg | DELAYED_RELEASE_TABLET | Freq: Every day | ORAL | Status: DC
  Administered 2018-07-09 – 2018-07-11 (×3): 325 mg via ORAL
  Filled 2018-07-08 (×3): qty 1

## 2018-07-08 MED ORDER — PHENOL 1.4 % MT LIQD: 1.0000 | OROMUCOSAL | Status: DC | PRN

## 2018-07-08 MED ORDER — MEPERIDINE HCL 50 MG/ML IJ SOLN: 6.2500 mg | INTRAMUSCULAR | Status: DC | PRN

## 2018-07-08 MED ORDER — SODIUM CHLORIDE 0.9 % IV SOLN
INTRAVENOUS | Status: DC
  Administered 2018-07-08: 18:00:00 via INTRAVENOUS

## 2018-07-08 MED ORDER — CEFAZOLIN SODIUM-DEXTROSE 1-4 GM/50ML-% IV SOLN
1.0000 g | Freq: Three times a day (TID) | INTRAVENOUS | Status: AC
  Administered 2018-07-08 – 2018-07-09 (×2): 1 g via INTRAVENOUS
  Filled 2018-07-08 (×2): qty 50

## 2018-07-08 MED ORDER — DOCUSATE SODIUM 100 MG PO CAPS
100.0000 mg | ORAL_CAPSULE | Freq: Two times a day (BID) | ORAL | Status: DC
  Administered 2018-07-08 – 2018-07-11 (×5): 100 mg via ORAL
  Filled 2018-07-08 (×6): qty 1

## 2018-07-08 MED ORDER — CARVEDILOL 3.125 MG PO TABS: 3.1250 mg | ORAL_TABLET | Freq: Two times a day (BID) | ORAL | Status: DC

## 2018-07-08 MED ORDER — CARVEDILOL 6.25 MG PO TABS
6.2500 mg | ORAL_TABLET | Freq: Two times a day (BID) | ORAL | Status: DC
  Administered 2018-07-08: 6.25 mg via ORAL
  Filled 2018-07-08: qty 1

## 2018-07-08 MED ORDER — CLONIDINE HCL (ANALGESIA) 100 MCG/ML EP SOLN
EPIDURAL | Status: DC | PRN
  Administered 2018-07-08: 50 ug

## 2018-07-08 SURGICAL SUPPLY — 78 items
ATTUNE MED DOME PAT 32 KNEE (Knees) ×1 IMPLANT
ATTUNE MED DOME PAT 32MM KNEE (Knees) ×1 IMPLANT
ATTUNE PS FEM LT SZ 4 CEM KNEE (Femur) ×2 IMPLANT
ATTUNE PSRP INSR SZ4 5 KNEE (Insert) ×1 IMPLANT
ATTUNE PSRP INSR SZ4 5MM KNEE (Insert) ×1 IMPLANT
BANDAGE ACE 4X5 VEL STRL LF (GAUZE/BANDAGES/DRESSINGS) ×3 IMPLANT
BANDAGE ESMARK 6X9 LF (GAUZE/BANDAGES/DRESSINGS) ×1 IMPLANT
BASEPLATE TIBIAL ROTATING SZ 4 (Knees) ×2 IMPLANT
BENZOIN TINCTURE PRP APPL 2/3 (GAUZE/BANDAGES/DRESSINGS) ×3 IMPLANT
BLADE SAGITTAL 25.0X1.19X90 (BLADE) ×2 IMPLANT
BLADE SAGITTAL 25.0X1.19X90MM (BLADE) ×1
BLADE SAW SGTL 13X75X1.27 (BLADE) ×3 IMPLANT
BNDG ELASTIC 6X10 VLCR STRL LF (GAUZE/BANDAGES/DRESSINGS) ×3 IMPLANT
BNDG ESMARK 6X9 LF (GAUZE/BANDAGES/DRESSINGS) ×3
BOWL SMART MIX CTS (DISPOSABLE) ×3 IMPLANT
CEMENT HV SMART SET (Cement) ×6 IMPLANT
CLOSURE WOUND 1/2 X4 (GAUZE/BANDAGES/DRESSINGS) ×2
COVER SURGICAL LIGHT HANDLE (MISCELLANEOUS) ×3 IMPLANT
COVER WAND RF STERILE (DRAPES) ×3 IMPLANT
CUFF TOURNIQUET SINGLE 34IN LL (TOURNIQUET CUFF) ×3 IMPLANT
CUFF TOURNIQUET SINGLE 44IN (TOURNIQUET CUFF) IMPLANT
DRAPE ORTHO SPLIT 77X108 STRL (DRAPES) ×4
DRAPE SURG ORHT 6 SPLT 77X108 (DRAPES) ×2 IMPLANT
DRAPE U-SHAPE 47X51 STRL (DRAPES) ×3 IMPLANT
DRSG MEPILEX BORDER 4X8 (GAUZE/BANDAGES/DRESSINGS) ×2 IMPLANT
DRSG PAD ABDOMINAL 8X10 ST (GAUZE/BANDAGES/DRESSINGS) ×3 IMPLANT
DURAPREP 26ML APPLICATOR (WOUND CARE) ×6 IMPLANT
ELECT REM PT RETURN 9FT ADLT (ELECTROSURGICAL) ×3
ELECTRODE REM PT RTRN 9FT ADLT (ELECTROSURGICAL) ×1 IMPLANT
EVACUATOR 1/8 PVC DRAIN (DRAIN) IMPLANT
FACESHIELD WRAPAROUND (MASK) ×6 IMPLANT
FACESHIELD WRAPAROUND OR TEAM (MASK) ×2 IMPLANT
GAUZE SPONGE 4X4 12PLY STRL (GAUZE/BANDAGES/DRESSINGS) ×3 IMPLANT
GAUZE XEROFORM 5X9 LF (GAUZE/BANDAGES/DRESSINGS) ×3 IMPLANT
GLOVE BIO SURGEON STRL SZ8 (GLOVE) ×2 IMPLANT
GLOVE BIOGEL PI IND STRL 8 (GLOVE) ×2 IMPLANT
GLOVE BIOGEL PI IND STRL 8.5 (GLOVE) IMPLANT
GLOVE BIOGEL PI INDICATOR 8 (GLOVE) ×4
GLOVE BIOGEL PI INDICATOR 8.5 (GLOVE) ×2
GLOVE ORTHO TXT STRL SZ7.5 (GLOVE) ×6 IMPLANT
GOWN STRL REUS W/ TWL LRG LVL3 (GOWN DISPOSABLE) ×1 IMPLANT
GOWN STRL REUS W/ TWL XL LVL3 (GOWN DISPOSABLE) ×1 IMPLANT
GOWN STRL REUS W/TWL 2XL LVL3 (GOWN DISPOSABLE) ×3 IMPLANT
GOWN STRL REUS W/TWL LRG LVL3 (GOWN DISPOSABLE) ×2
GOWN STRL REUS W/TWL XL LVL3 (GOWN DISPOSABLE) ×2
HANDPIECE INTERPULSE COAX TIP (DISPOSABLE) ×2
IMMOBILIZER KNEE 22 UNIV (SOFTGOODS) ×3 IMPLANT
KIT BASIN OR (CUSTOM PROCEDURE TRAY) ×3 IMPLANT
KIT TURNOVER KIT B (KITS) ×3 IMPLANT
MANIFOLD NEPTUNE II (INSTRUMENTS) ×3 IMPLANT
MARKER SKIN DUAL TIP RULER LAB (MISCELLANEOUS) ×3 IMPLANT
NDL 18GX1X1/2 (RX/OR ONLY) (NEEDLE) ×1 IMPLANT
NDL HYPO 25GX1X1/2 BEV (NEEDLE) ×1 IMPLANT
NEEDLE 18GX1X1/2 (RX/OR ONLY) (NEEDLE) ×3 IMPLANT
NEEDLE HYPO 25GX1X1/2 BEV (NEEDLE) ×3 IMPLANT
NS IRRIG 1000ML POUR BTL (IV SOLUTION) ×3 IMPLANT
PACK TOTAL JOINT (CUSTOM PROCEDURE TRAY) ×3 IMPLANT
PAD ARMBOARD 7.5X6 YLW CONV (MISCELLANEOUS) ×6 IMPLANT
PAD CAST 4YDX4 CTTN HI CHSV (CAST SUPPLIES) ×1 IMPLANT
PADDING CAST COTTON 4X4 STRL (CAST SUPPLIES) ×2
PADDING CAST COTTON 6X4 STRL (CAST SUPPLIES) ×3 IMPLANT
SET HNDPC FAN SPRY TIP SCT (DISPOSABLE) ×1 IMPLANT
STAPLER VISISTAT 35W (STAPLE) IMPLANT
STRIP CLOSURE SKIN 1/2X4 (GAUZE/BANDAGES/DRESSINGS) ×4 IMPLANT
SUCTION FRAZIER HANDLE 10FR (MISCELLANEOUS) ×2
SUCTION TUBE FRAZIER 10FR DISP (MISCELLANEOUS) ×1 IMPLANT
SUT VIC AB 0 CT1 27 (SUTURE) ×2
SUT VIC AB 0 CT1 27XBRD ANBCTR (SUTURE) ×1 IMPLANT
SUT VIC AB 1 CTX 36 (SUTURE) ×4
SUT VIC AB 1 CTX36XBRD ANBCTR (SUTURE) ×2 IMPLANT
SUT VIC AB 2-0 CT1 27 (SUTURE) ×4
SUT VIC AB 2-0 CT1 TAPERPNT 27 (SUTURE) ×2 IMPLANT
SUT VIC AB 3-0 X1 27 (SUTURE) ×3 IMPLANT
SYR 50ML LL SCALE MARK (SYRINGE) ×3 IMPLANT
SYR CONTROL 10ML LL (SYRINGE) ×3 IMPLANT
TOWEL OR 17X24 6PK STRL BLUE (TOWEL DISPOSABLE) ×3 IMPLANT
TOWEL OR 17X26 10 PK STRL BLUE (TOWEL DISPOSABLE) ×3 IMPLANT
TRAY CATH 16FR W/PLASTIC CATH (SET/KITS/TRAYS/PACK) IMPLANT

## 2018-07-08 NOTE — Progress Notes (Signed)
PT Cancellation Note  Patient Details Name: Sara [name] MRN: [id number] DOB: 02-Feb-1926   Cancelled Treatment:    Reason Eval/Treat Not Completed: Medical issues which prohibited therapy. Rapid response called per RN. Reports HR in low 30s and patient currently in a fib. Will hold until patient is medically appropriate. Will follow up as schedule allows.   Erick Blinks, SPT  Erick Blinks 07/08/2018, 6:05 PM

## 2018-07-08 NOTE — Transfer of Care (Signed)
Immediate Anesthesia Transfer of Care Note  Patient: Sara [name]  Procedure(s) Performed: LEFT TOTAL KNEE ARTHROPLASTY CEMENTED (Left )  Patient Location: PACU  Anesthesia Type:Spinal and MAC combined with regional for post-op pain  Level of Consciousness: awake, alert  and oriented  Airway & Oxygen Therapy: Patient Spontanous Breathing  Post-op Assessment: Report given to RN and Post -op Vital signs reviewed and stable  Post vital signs: Reviewed and stable  Last Vitals:  Vitals Value Taken Time  BP 99/47 07/08/2018  3:04 PM  Temp    Pulse 64 07/08/2018  3:12 PM  Resp 14 07/08/2018  3:12 PM  SpO2 89 % 07/08/2018  3:12 PM  Vitals shown include unvalidated device data.  Last Pain:  Vitals:   07/08/18 1052  TempSrc:   PainSc: 0-No pain         Complications: No apparent anesthesia complications

## 2018-07-08 NOTE — Interval H&P Note (Signed)
History and Physical Interval Note:  07/08/2018 12:10 PM  Sara [name]  has presented today for surgery, with the diagnosis of LEFT KNEE OA  The various methods of treatment have been discussed with the patient and family. After consideration of risks, benefits and other options for treatment, the patient has consented to  Procedure(s): LEFT TOTAL KNEE ARTHROPLASTY CEMENTED (Left) as a surgical intervention .  The patient's history has been reviewed, patient examined, no change in status, stable for surgery.  I have reviewed the patient's chart and labs.  Questions were answered to the patient's satisfaction.     Marybelle Killings

## 2018-07-08 NOTE — Significant Event (Signed)
Rapid Response Event Note  Overview:  Called to assist with patient with brady event Time Called: 1740 Arrival Time: 1748 Event Type: Cardiac  Initial Focused Assessment:  Patient supine in bed - warm dry and pink - alert and oriented - denies chest pain or SOB - states she felt a little dizzy momentarily on the way from the PACU to the 5N room - currently not dizzy - no nausea or SOB with the event.  She is in afib on the monitor - not new for her.  Left knee DDI - RN Hoyle Sauer reports reinforcing dressing once.  All vital signs stable - HR now in the 60 and 70 range.  See flowsheet for details.     Interventions: 12 lead EKG done.  Spoke with Dr. Lorin Mercy from the [place] - orders for soft fluid challenge.  Bil BS = clear - no JVD noted.  NS 125 cc/hr x 2 started per order.  Reviewed cardiac strips in monitor - pause noted - strips printed and to chart.  Dr. Lorin Mercy to bedside. Labs ordered and drawn.  Dr. Harrington Challenger in.  Handoff to [employer].   Plan of Care (if not transferred): Continue telemetry monitor -to call as needed.   Event Summary: Name of Physician Notified: Dr. Lorin Mercy at (pta RRT)    at    Outcome: Stayed in room and stabalized  Event End Time: [place]  Quin Hoop

## 2018-07-08 NOTE — Op Note (Signed)
Preop diagnosis: Left knee primary osteoarthritis with varus deformity  Postop diagnosis: Same  Procedure: Left cemented total knee arthroplasty. Depuy Attune #4 femur #4 tibia 5 mm rotating platform 32 mm 3 peg patella.  Surgeon: Rodell Perna, MD  Assistant: Benjiman Core, PA-C medically necessary and present for the entire procedure  Anesthesia spinal plus Marcaine and Exparel  Tourniquet: 350 x 10 minutes inflated only for cementing of the prosthesis due to previous arterial stent femoral artery.  Procedure after induction of spinal anesthesia proximal thigh tourniquet lateral post heel bump DuraPrep preoperative antibiotics timeout procedure sterile skin marker usual extremity sheets drapes impervious stockinette Covan and Betadine Steri-Drape was used to seal the skin.  Ancef was given prophylactically.  Midline incision was made medial parapatellar incision was made Bovie was used extensively stop all arterial bleeders since the tourniquet was not inflated.  There was varus deformity bone-on-bone and erosion of the medial tibial plateau and medial femoral condyle with 1 to 2 cm medial shift of the femur on the tibia marginal osteophytes were removed and patella was cut from facet the facet removing 10 mm.  Intramedullary hole made on the femur distal femoral cut 9 mm.  Cut was made on the tibia using the external guide and minimal bone was taken medially since there was erosive wear and more bone laterally.  This allowed full extension and 5 mm spacer block allowed full extension and a good balance of the collateral ligaments.  Chamfer cuts were made on the femur box cut keel preparation on the tibia trials were inserted with full extension good balance good flexion 130 degrees.  Three-quarter curved osteotome was used to remove large spurs off the posterior medial femur.  Pulse lavage vacuum mixing of the cement and then leg was elevated tourniquet inflated for a total of 10 minutes while the  prosthesis were cemented in and then deflated as the cement was getting hard.  There was good arterial blood flow.  Repeat pulse lavage and standard layered closure with #1 Vicryl knee quad tendon split and medial retinaculum.  Toe Vicryl subtenons tissue skin staple closure postop dressing , knee immobilizer.

## 2018-07-08 NOTE — Progress Notes (Signed)
Orthopedic Tech Progress Note Patient Details:  Sara [name] 1926/01/08 [id number]  CPM Left Knee CPM Left Knee: On Left Knee Flexion (Degrees): 90 Left Knee Extension (Degrees): 0 Additional Comments: foot roll  Post Interventions Patient Tolerated: Well Instructions Provided: Care of device, Adjustment of device  [name] 07/08/2018, 4:29 PM

## 2018-07-08 NOTE — H&P (Signed)
Sara [name] is an 85 y.o. female.   Chief Complaint: Left knee osteoarthritis, primary HPI: 85 year old female had previous total knee arthroplasty done 8 years ago on the right side now has had progressive left knee osteoarthritis of the point where she is having problems ambulating despite using a walker.  Patient has bone-on-bone changes with 2 cm shifting left femur medially on tibia with marginal osteophyte subchondral sclerosis.  She has pain at night pain with standing and great difficulty ambulating even with a walker.  She has had previous use of Tylenol and intra-articular cortisone injections without relief.  10 degree varus deformity due to medial tibial plateau erosive wear.Pain progressed to point she now is not able to walk for several months due to left knee pain. Surgery delayed due to left LE arterial stent now with good flow .   Past Medical History:  Diagnosis Date  . Ankle edema    Mild, which is intermittent, usually mildly dependent, left slightly greater than the right.  . Anxiety   . Arthritis    In her knees  . Balance problem    Due to weakened knee  . Chronic diastolic heart failure (HCC)    Class I-II -- exacerbated by Afib RVR  . Corneal edema    Severe corneal edema in right eye with multiple folds. Developed after cataract surgery 05/03/09  . Dysrhythmia    a-fib    since aghe 85 yrs old  . GERD (gastroesophageal reflux disease)   . Hematuria    Resolved after coumadin was stopped  . Hiatal hernia    With GERD  . History of breast cancer   . HOH (hard of hearing)   . Hyperglycemia    Random, without any history of diabetes  . Hyperlipidemia   . Hypertension    Difficult to control. Renal Doppler 06/02/10 showed less that 60% bilateral renal artery narrowing.  . Hypokalemia   . PAD (peripheral artery disease) ([place])   . Permanent atrial fibrillation    Rate control.  ASA.  NOT on DOAC or warfarin due to concern for falls &bleeding.  Marland Kitchen  Posthemorrhagic anemia   . Pseudophakia   . Syncope 02/22/11   During OV on 02/22/11 her INR was 7.3 and was given 2.5 mg of vitamin K. Later that day she became diaphoretic & fainted.  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 ABDOMINAL HYSTERECTOMY    . BREAST BIOPSY     3 biopsies  . CARDIOVERSION N/A 03/31/2016   Procedure: CARDIOVERSION;  Surgeon: Jerline Pain, MD;  Location: [facility] ENDOSCOPY;  Service: Cardiovascular: Successful cardioversion from A. fib to sinus rhythm  . CATARACT EXTRACTION  05/03/09  . CHOLECYSTECTOMY    . EYE SURGERY     cataract  . HEMORRHOID SURGERY    . LOWER EXTREMITY ANGIOGRAPHY Right 03/18/2018   Procedure: LOWER EXTREMITY ANGIOGRAPHY;  Surgeon: Lorretta Harp, MD;  Location: [place] [facility];  Service: Cardiovascular;  Laterality: Right;  . PERIPHERAL VASCULAR INTERVENTION  03/18/2018   Procedure: PERIPHERAL VASCULAR INTERVENTION;  Surgeon: Lorretta Harp, MD;  Location: [place] [facility];  Service: Cardiovascular;;  left SFA  . Remote hysterectomy    . TEE WITHOUT CARDIOVERSION N/A 03/31/2016   Procedure: TRANSESOPHAGEAL ECHOCARDIOGRAM (TEE);  Surgeon: Jerline Pain, MD;  Location: [place];  Service: Cardiovascular:  EF 55-60%. No vegetation or thrombus okay for DC CV  .  TONSILLECTOMY    . TOTAL KNEE ARTHROPLASTY Right 08/26/10   By Dr. Elta Guadeloupe C. Akaisha Truman. Cemented, computer assist  . TRANSTHORACIC ECHOCARDIOGRAM  03/2016    Mild LVH. EF 65-70%. High LVEDP no RWMA. Severe LA dilation. Peak PAP ~ 37 mmHg    Family History  Problem Relation Age of Onset  . Cancer Mother        Mouth  . Hypertension Mother   . Coronary artery disease Brother   . Hypertension Brother   . Diabetes type II Brother   . Heart attack Sister   . Hypertension Sister    Social History:  reports that she has never smoked. She has never used smokeless tobacco. She reports that she does not drink alcohol or use  drugs.  Allergies:  Allergies  Allergen Reactions  . Amiodarone Shortness Of Breath and Other (See Comments)    Stopped in Feb 2017 secondary to pulmonary complications  . Aliskiren Other (See Comments) and Nausea Only    unknown  . Amlodipine Besylate-Valsartan Other (See Comments) and Nausea Only    Unclear  . Bystolic [Nebivolol Hcl] Other (See Comments)    unknown  . Clonidine Derivatives Other (See Comments)    unknown  . Dronedarone Nausea And Vomiting and Nausea Only  . Hctz [Hydrochlorothiazide] Other (See Comments)    Currently taking without problem; question if this has to do with hypokalemia  . Hydralazine Other (See Comments)    Also tolerated, but had orthostatic changes on standing medication  . Lisinopril Other (See Comments) and Nausea Only    unknown  . Nebivolol Nausea Only  . Olmesartan Nausea Only and Other (See Comments)    Unclear of the intolerance  . Rythmol [Propafenone] Other (See Comments)    unknown  . Statins Other (See Comments) and Nausea Only    unknown  . Carvedilol Other (See Comments) and Nausea And Vomiting    fatigue  . Prednisone Nausea And Vomiting and Other (See Comments)    Jittery     Medications Prior to Admission  Medication Sig Dispense Refill  . aspirin EC 81 MG tablet Take 81 mg by mouth daily.    . carvedilol (COREG) 12.5 MG tablet Take 1 tablet (12.5 mg total) by mouth 2 (two) times daily. 180 tablet 1  . clopidogrel (PLAVIX) 75 MG tablet Take 1 tablet (75 mg total) by mouth daily with breakfast. 90 tablet 9  . digoxin (LANOXIN) 0.125 MG tablet TAKE 2 TABLETS FOR THE FIRST DOSE THEN ONE TABLET ONCE DAILY (Patient taking differently: Take 0.25 mg by mouth daily. ) 92 tablet 3  . furosemide (LASIX) 40 MG tablet TAKE ONE TABLET BY MOUTH TWICE DAILY 60 tablet 6  . NON FORMULARY Apply 1-2 g topically 4 (four) times daily as needed for pain. C-COMBINATION PAIN CR  (baclofen 2%-doxepin 5%-gabapentin 6%-pentoxifylline 3%-topiramate  2%)  2  . potassium chloride SA (K-DUR,KLOR-CON) 20 MEQ tablet Take 2 tablets (40 mEq total) by mouth 2 (two) times daily. 180 tablet 4  . rOPINIRole (REQUIP) 1 MG tablet Take 1 tablet by mouth at bedtime.   3  . diltiazem (CARDIZEM CD) 180 MG 24 hr capsule Take 1 capsule (180 mg total) by mouth at bedtime. (Patient not taking: Reported on 06/28/2018) 90 capsule 3    No results found for this or any previous visit (from the past 48 hour(s)). No results found.  ROS positive for history of breast cancer, chronic atrial fib, class I diastolic heart failure.  She has been cleared by cardiology.  Previous right total knee arthroplasty 2012.  Previous gallbladder surgery, cataract surgery.  Blood pressure (!) 161/69, pulse 65, temperature 97.8 F (36.6 C), temperature source Oral, resp. rate 18, height 5' (1.524 m), weight 67.1 kg, SpO2 97 %. Physical Exam  Constitutional: She is oriented to person, place, and time.  Cardiovascular: Exam reveals no gallop and no friction rub.  Irregular irregular pulse rate 65.  Respiratory: Effort normal. She has no wheezes.  GI: Soft. Bowel sounds are normal.  Musculoskeletal:     Comments: Well-healed right total knee arthroplasty with no effusion balanced collateral ligaments good extension flexion past 100 degrees.  Left knee shows varus deformity severe crepitus she has severe pain with range of motion.  Distal pulses are intact.  Neurological: She is alert and oriented to person, place, and time.  Skin: Skin is warm. No erythema.  Psychiatric: She has a normal mood and affect. Her behavior is normal. Thought content normal.  Mild edema lower extremities without venous stasis ulceration.  Assessment/Plan End-stage left knee primary osteoarthritis bone-on-bone with varus deformity marginal osteophytes and medial erosive wear.  Plan left total knee arthroplasty for end-stage knee primary osteoarthritis.  Plan procedure was discussed with patient questions  were elicited and answered.  She understands and agrees to proceed. Patient wants to go to [facility] in [place] after her procedure for therapy. Her son lives with her and works.   Marybelle Killings, MD 07/08/2018, 11:12 AM

## 2018-07-08 NOTE — Consult Note (Signed)
Cardiology Consultation:   Patient ID: [name] MRN: [id number]; DOB: [birth year]  Admit date: 07/08/2018 Date of Consult: 07/08/2018  Primary Care Provider: Townsend Roger, MD Primary Cardiologist: Glenetta Hew, MD     Patient Profile:   Sara [name] is a 85 y.o. female with a hx of atrial fibrillation  who is being seen today for the evaluation of bradycardia and dizziness at the request of Dr Lorin Mercy   History of Present Illness:   Sara [name] is a 85 yo with hsitory of atrial fibrillation (rate controlled), HTN and PAD   She follows with D harding in clinic     Last seen in December 2019    At that time she had problems with rate controll   Still having episodes of fast HR   Dig started at low dose  (patient is taking at 0.25, higher than prescribed dose)  Dilt added   She refuses anticoagulation and is maintained on ASA and plavix (for PVOD)  Today she underewent TKA of L knee  This afternoon her heart rate was noted to be in the low 30s and she was dizzy    Consult called   Past Medical History:  Diagnosis Date  . Ankle edema    Mild, which is intermittent, usually mildly dependent, left slightly greater than the right.  . Anxiety   . Arthritis    In her knees  . Balance problem    Due to weakened knee  . Chronic diastolic heart failure (HCC)    Class I-II -- exacerbated by Afib RVR  . Corneal edema    Severe corneal edema in right eye with multiple folds. Developed after cataract surgery 05/03/09  . Dysrhythmia    a-fib    since aghe 85 yrs old  . GERD (gastroesophageal reflux disease)   . Hematuria    Resolved after coumadin was stopped  . Hiatal hernia    With GERD  . History of breast cancer   . HOH (hard of hearing)   . Hyperglycemia    Random, without any history of diabetes  . Hyperlipidemia   . Hypertension    Difficult to control. Renal Doppler 06/02/10 showed less that 60% bilateral renal artery narrowing.  . Hypokalemia   . PAD (peripheral  artery disease) ([place])   . Permanent atrial fibrillation    Rate control.  ASA.  NOT on DOAC or warfarin due to concern for falls &bleeding.  Marland Kitchen Posthemorrhagic anemia   . Pseudophakia   . Syncope 02/22/11   During OV on 02/22/11 her INR was 7.3 and was given 2.5 mg of vitamin K. Later that day she became diaphoretic & fainted.  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 ABDOMINAL HYSTERECTOMY    . BREAST BIOPSY     3 biopsies  . CARDIOVERSION N/A 03/31/2016   Procedure: CARDIOVERSION;  Surgeon: Jerline Pain, MD;  Location: [facility] ENDOSCOPY;  Service: Cardiovascular: Successful cardioversion from A. fib to sinus rhythm  . CATARACT EXTRACTION  05/03/09  . CHOLECYSTECTOMY    . EYE SURGERY     cataract  . HEMORRHOID SURGERY    . LOWER EXTREMITY ANGIOGRAPHY Right 03/18/2018   Procedure: LOWER EXTREMITY ANGIOGRAPHY;  Surgeon: Lorretta Harp, MD;  Location: [place] [facility];  Service: Cardiovascular;  Laterality: Right;  . PERIPHERAL VASCULAR INTERVENTION  03/18/2018   Procedure: PERIPHERAL VASCULAR INTERVENTION;  Surgeon: Quay Burow  J, MD;  Location: [place] [facility];  Service: Cardiovascular;;  left SFA  . Remote hysterectomy    . TEE WITHOUT CARDIOVERSION N/A 03/31/2016   Procedure: TRANSESOPHAGEAL ECHOCARDIOGRAM (TEE);  Surgeon: Jerline Pain, MD;  Location: [place];  Service: Cardiovascular:  EF 55-60%. No vegetation or thrombus okay for DC CV  . TONSILLECTOMY    . TOTAL KNEE ARTHROPLASTY Right 08/26/10   By Dr. Elta Guadeloupe C. Yates. Cemented, computer assist  . TRANSTHORACIC ECHOCARDIOGRAM  03/2016    Mild LVH. EF 65-70%. High LVEDP no RWMA. Severe LA dilation. Peak PAP ~ 37 mmHg       Inpatient Medications: Scheduled Meds: . [START ON 07/09/2018] aspirin EC  325 mg Oral Q breakfast  . carvedilol  3.125 mg Oral BID  . [START ON 07/09/2018] clopidogrel  75 mg Oral Q breakfast  . docusate sodium  100 mg Oral BID  .  furosemide  40 mg Oral BID  . [START ON 07/09/2018] potassium chloride SA  40 mEq Oral BID  . rOPINIRole  1 mg Oral QHS   Continuous Infusions: . sodium chloride 85 mL/hr at 07/08/18 1800  . sodium chloride 125 mL/hr at 07/08/18 1807  .  ceFAZolin (ANCEF) IV     PRN Meds: [START ON 07/09/2018] acetaminophen, HYDROcodone-acetaminophen, menthol-cetylpyridinium **OR** phenol, metoCLOPramide **OR** metoCLOPramide (REGLAN) injection, ondansetron **OR** ondansetron (ZOFRAN) IV, polyethylene glycol  Allergies:    Allergies  Allergen Reactions  . Amiodarone Shortness Of Breath and Other (See Comments)    Stopped in Feb 2017 secondary to pulmonary complications  . Aliskiren Other (See Comments) and Nausea Only    unknown  . Amlodipine Besylate-Valsartan Other (See Comments) and Nausea Only    Unclear  . Bystolic [Nebivolol Hcl] Other (See Comments)    unknown  . Clonidine Derivatives Other (See Comments)    unknown  . Dronedarone Nausea And Vomiting and Nausea Only  . Hctz [Hydrochlorothiazide] Other (See Comments)    Currently taking without problem; question if this has to do with hypokalemia  . Hydralazine Other (See Comments)    Also tolerated, but had orthostatic changes on standing medication  . Lisinopril Other (See Comments) and Nausea Only    unknown  . Nebivolol Nausea Only  . Olmesartan Nausea Only and Other (See Comments)    Unclear of the intolerance  . Rythmol [Propafenone] Other (See Comments)    unknown  . Statins Other (See Comments) and Nausea Only    unknown  . Carvedilol Other (See Comments) and Nausea And Vomiting    fatigue  . Prednisone Nausea And Vomiting and Other (See Comments)    Jittery     Social History:   Social History   Socioeconomic History  . Marital status: Widowed    Spouse name: Not on file  . Number of children: 1  . Years of education: Not on file  . Highest education level: Not on file  Occupational History    Employer: RETIRED     Social Needs  . Financial resource strain: Not on file  . Food insecurity:    Worry: Not on file    Inability: Not on file  . Transportation needs:    Medical: Not on file    Non-medical: Not on file  Tobacco Use  . Smoking status: Never Smoker  . Smokeless tobacco: Never Used  Substance and Sexual Activity  . Alcohol use: No  . Drug use: No  . Sexual activity: Not on file  Lifestyle  .  Physical activity:    Days per week: Not on file    Minutes per session: Not on file  . Stress: Not on file  Relationships  . Social connections:    Talks on phone: Not on file    Gets together: Not on file    Attends religious service: Not on file    Active member of club or organization: Not on file    Attends meetings of clubs or organizations: Not on file    Relationship status: Not on file  . Intimate partner violence:    Fear of current or ex partner: Not on file    Emotionally abused: Not on file    Physically abused: Not on file    Forced sexual activity: Not on file  Other Topics Concern  . Not on file  Social History Narrative   She is a widowed mother of one and grandmother of one.  She does not exercise.  She does not smoke and does not drink alcohol.    Family History:    Family History  Problem Relation Age of Onset  . Cancer Mother        Mouth  . Hypertension Mother   . Coronary artery disease Brother   . Hypertension Brother   . Diabetes type II Brother   . Heart attack Sister   . Hypertension Sister      ROS:  Please see the history of present illness.   All other ROS reviewed and negative.     Physical Exam/Data:   Vitals:   07/08/18 1535 07/08/18 1550 07/08/18 1605 07/08/18 1638  BP: (!) 102/54 (!) 105/46 (!) 118/54 129/73  Pulse: (!) 56 60 60 (!) 55  Resp: 19 16 20    Temp:   97.7 F (36.5 C) (!) 97 F (36.1 C)  TempSrc:    Oral  SpO2: 96% 95% 98% 100%  Weight:    67.1 kg  Height:    5' (1.524 m)    Intake/Output Summary (Last 24 hours) at  07/08/2018 1822 Last data filed at 07/08/2018 1800 Gross per 24 hour  Intake 1055.84 ml  Output 275 ml  Net 780.84 ml   Last 3 Weights 07/08/2018 07/08/2018 06/28/2018  Weight (lbs) 147 lb 14.9 oz 148 lb 148 lb  Weight (kg) 67.1 kg 67.132 kg 67.132 kg     Body mass index is 28.89 kg/m.  General:  Well nourished, well developed, in no acute distress HEENT: normal Lymph: no adenopathy Neck: no JVD Endocrine:  No thryomegaly Vascular: No carotid bruits; FA pulses 2+ bilaterally without bruits  Cardiac:  normal S1, S2;  Irreg irreg  ; no murmur  Lungs:  clear to auscultation bilaterally, no wheezing, rhonchi or rales  Abd: soft, nontender, no hepatomegaly  Ext: no edema Musculoskeletal:  S[ /P L TKA Skin: warm and dry  Neuro:  CNs 2-12 intact, no focal abnormalities noted Psych:  Normal affect   EKG:  The EKG was personally reviewed and demonstrates:  Atrial fib   71 bpm  RBBB   ST depression in lateral leads (old) Telemetry:  Telemetry was personally reviewed and demonstrates:    Afib   Short spell in 30s   Now 70s    Relevant CV Studies:   Laboratory Data:  ChemistryNo results for input(s): NA, K, CL, CO2, GLUCOSE, BUN, CREATININE, CALCIUM, GFRNONAA, GFRAA, ANIONGAP in the last 168 hours.  No results for input(s): PROT, ALBUMIN, AST, ALT, ALKPHOS, BILITOT in the last 168 hours.  HematologyNo results for input(s): WBC, RBC, HGB, HCT, MCV, MCH, MCHC, RDW, PLT in the last 168 hours. Cardiac EnzymesNo results for input(s): TROPONINI in the last 168 hours. No results for input(s): TROPIPOC in the last 168 hours.  BNPNo results for input(s): BNP, PROBNP in the last 168 hours.  DDimer No results for input(s): DDIMER in the last 168 hours.  Radiology/Studies:  Dg Knee 1-2 Views Left  Result Date: 07/08/2018 CLINICAL DATA:  Postop total knee arthroplasty. EXAM: LEFT KNEE - 1-2 VIEW COMPARISON:  None. FINDINGS: The tibial and femoral components are well seated. No complicating features  are identified. No periprosthetic fracture. A long femoral artery and popliteal artery stent is noted. IMPRESSION: Well seated components of a total left knee arthroplasty without complicating features. Electronically Signed   By: Marijo Sanes M.D.   On: 07/08/2018 16:06    Assessment and Plan:   Pt is a 85 yo with known chronic atrial fibrillation   On rate control  Rates have been difficult to control somewhat as outpt   Note pt was taking a higher dose of dig than prescribed  Not clear when stopped this    Today she is post op from L TKA   HR earlier was low   Pt a little dizzy   Longest pause 2.7 seconds Episode was probably multifactoral in origin    Now rates are better   Pt comfortable  Recomm   Cut back on carvedilol tonight to 6.25 bid    Check digoxin level Reassess and  Increase/decrease as needed  I would cut back on IVF to 70 cc per hour    STrict I/O  Note pt, despite CHADS VASC elevation does not want long term anticoagulation   2   PVOD   Continue meds (ASA and plavix)  3  HTN   Follow BP   For questions or updates, please contact [facility] Please consult [email or web address] for contact info under     Signed, Dorris Carnes, MD  07/08/2018 6:22 PM

## 2018-07-08 NOTE — Anesthesia Preprocedure Evaluation (Addendum)
Anesthesia Evaluation  Patient identified by MRN, date of birth, ID band Patient awake    Reviewed: Allergy & Precautions, H&P , NPO status , Patient's Chart, lab work & pertinent test results, reviewed documented beta blocker date and time   History of Anesthesia Complications Negative for: history of anesthetic complications  Airway Mallampati: III  TM Distance: <3 FB Neck ROM: Full    Dental  (+) Dental Advisory Given, Poor Dentition, Chipped, Missing   Pulmonary neg shortness of breath, neg sleep apnea, neg COPD, neg recent URI,  BOOP   Pulmonary exam normal breath sounds clear to auscultation       Cardiovascular hypertension, Pt. on home beta blockers and Pt. on medications (-) angina(-) Past MI, (-) Cardiac Stents and (-) CABG + dysrhythmias Atrial Fibrillation  Rhythm:Irregular Rate:Normal  EKG: 05/22/18 ([facility]): Afib at 109 bpm. Right BBB.   TEE 03/31/16: Study Conclusions - Left ventricle: Systolic function was normal. The estimated ejection fraction was in the range of 55% to 60%. - Aortic valve: No evidence of vegetation.   Nuclear stress test 07/27/10:  The remaining myocardium demonstrates normal myocardial perfusion with no evidence of ischemia or infarct.  No prior study available for comparison.  No significant ischemia demonstrated.  Post stress ejection fraction is 74%.  Global left ventricular systolic function is normal.  No significant wall motion abnormalities noted.  EKG is negative for ischemia. This is a low risk scan.    Neuro/Psych neg Seizures (questionable seizures) PSYCHIATRIC DISORDERS Anxiety    GI/Hepatic Neg liver ROS, hiatal hernia, GERD  ,  Endo/Other  negative endocrine ROS  Renal/GU negative Renal ROS     Musculoskeletal  (+) Arthritis , Osteoarthritis,    Abdominal   Peds  Hematology  (+) Blood dyscrasia, anemia ,   Anesthesia Other Findings    Reproductive/Obstetrics                          Anesthesia Physical  Anesthesia Plan  ASA: III  Anesthesia Plan: Spinal   Post-op Pain Management:  Regional for Post-op pain   Induction: Intravenous  PONV Risk Score and Plan: 2 and Treatment may vary due to age or medical condition and Ondansetron  Airway Management Planned: Natural Airway and Nasal Cannula  Additional Equipment:   Intra-op Plan:   Post-operative Plan:   Informed Consent: I have reviewed the patients History and Physical, chart, labs and discussed the procedure including the risks, benefits and alternatives for the proposed anesthesia with the patient or authorized representative who has indicated his/her understanding and acceptance.     Dental advisory given  Plan Discussed with: CRNA, Anesthesiologist and Surgeon  Anesthesia Plan Comments: (  )       Anesthesia Quick Evaluation

## 2018-07-08 NOTE — Anesthesia Procedure Notes (Signed)
Spinal  Patient location during procedure: OR Start time: 07/08/2018 12:45 PM End time: 07/08/2018 12:50 PM Staffing Anesthesiologist: Janeece Riggers, MD Preanesthetic Checklist Completed: patient identified, site marked, surgical consent, pre-op evaluation, timeout performed, IV checked, risks and benefits discussed and monitors and equipment checked Spinal Block Patient position: sitting Prep: DuraPrep Patient monitoring: heart rate, cardiac monitor, continuous pulse ox and blood pressure Approach: midline Location: L3-4 Injection technique: single-shot Needle Needle type: Sprotte  Needle gauge: 22 G Needle length: 9 cm Assessment Sensory level: T4 Additional Notes Multiple attempts at L4/5 without success and osso.  Change to 22G at L3/4 paramedian Left on 3rd pass.

## 2018-07-08 NOTE — Plan of Care (Signed)
  Problem: Education: Goal: Knowledge of General Education information will improve Description Including pain rating scale, medication(s)/side effects and non-pharmacologic comfort measures Outcome: Progressing   Problem: Health Behavior/Discharge Planning: Goal: Ability to manage health-related needs will improve Outcome: Progressing   

## 2018-07-08 NOTE — Anesthesia Procedure Notes (Signed)
Anesthesia Regional Block: Adductor canal block   Pre-Anesthetic Checklist: ,, timeout performed, Correct Patient, Correct Site, Correct Laterality, Correct Procedure, Correct Position, site marked, Risks and benefits discussed,  Surgical consent,  Pre-op evaluation,  At surgeon's request and post-op pain management  Laterality: Left  Prep: chloraprep       Needles:  Injection technique: Single-shot  Needle Type: Echogenic Stimulator Needle     Needle Length: 5cm  Needle Gauge: 22     Additional Needles:   Procedures:, nerve stimulator,,, ultrasound used (permanent image in chart),,,,  Narrative:  Start time: 07/08/2018 11:38 AM End time: 07/08/2018 11:42 AM Injection made incrementally with aspirations every 5 mL.  Performed by: Personally  Anesthesiologist: Duane Boston, MD  Additional Notes: Functioning IV was confirmed and monitors were applied.  A 71mm 22ga Arrow echogenic stimulator needle was used. Sterile prep and drape,hand hygiene and sterile gloves were used. Ultrasound guidance: relevant anatomy identified, needle position confirmed, local anesthetic spread visualized around nerve(s)., vascular puncture avoided.  Image printed for medical record. Negative aspiration and negative test dose prior to incremental administration of local anesthetic. The patient tolerated the procedure well.

## 2018-07-09 ENCOUNTER — Encounter (HOSPITAL_COMMUNITY): Payer: Self-pay | Admitting: Orthopaedic Surgery

## 2018-07-09 DIAGNOSIS — Z9049 Acquired absence of other specified parts of digestive tract: Secondary | ICD-10-CM | POA: Diagnosis not present

## 2018-07-09 DIAGNOSIS — I110 Hypertensive heart disease with heart failure: Secondary | ICD-10-CM | POA: Diagnosis present

## 2018-07-09 DIAGNOSIS — I482 Chronic atrial fibrillation, unspecified: Secondary | ICD-10-CM | POA: Diagnosis not present

## 2018-07-09 DIAGNOSIS — M25762 Osteophyte, left knee: Secondary | ICD-10-CM | POA: Diagnosis present

## 2018-07-09 DIAGNOSIS — H9190 Unspecified hearing loss, unspecified ear: Secondary | ICD-10-CM | POA: Diagnosis present

## 2018-07-09 DIAGNOSIS — K219 Gastro-esophageal reflux disease without esophagitis: Secondary | ICD-10-CM | POA: Diagnosis present

## 2018-07-09 DIAGNOSIS — Z7982 Long term (current) use of aspirin: Secondary | ICD-10-CM | POA: Diagnosis not present

## 2018-07-09 DIAGNOSIS — Z8249 Family history of ischemic heart disease and other diseases of the circulatory system: Secondary | ICD-10-CM | POA: Diagnosis not present

## 2018-07-09 DIAGNOSIS — I4821 Permanent atrial fibrillation: Secondary | ICD-10-CM | POA: Diagnosis present

## 2018-07-09 DIAGNOSIS — Z800 Family history of malignant neoplasm of digestive organs: Secondary | ICD-10-CM | POA: Diagnosis not present

## 2018-07-09 DIAGNOSIS — Z9849 Cataract extraction status, unspecified eye: Secondary | ICD-10-CM | POA: Diagnosis not present

## 2018-07-09 DIAGNOSIS — Z961 Presence of intraocular lens: Secondary | ICD-10-CM | POA: Diagnosis present

## 2018-07-09 DIAGNOSIS — Z853 Personal history of malignant neoplasm of breast: Secondary | ICD-10-CM | POA: Diagnosis not present

## 2018-07-09 DIAGNOSIS — E876 Hypokalemia: Secondary | ICD-10-CM | POA: Diagnosis not present

## 2018-07-09 DIAGNOSIS — I10 Essential (primary) hypertension: Secondary | ICD-10-CM | POA: Diagnosis not present

## 2018-07-09 DIAGNOSIS — Z888 Allergy status to other drugs, medicaments and biological substances status: Secondary | ICD-10-CM | POA: Diagnosis not present

## 2018-07-09 DIAGNOSIS — M21162 Varus deformity, not elsewhere classified, left knee: Secondary | ICD-10-CM | POA: Diagnosis present

## 2018-07-09 DIAGNOSIS — I739 Peripheral vascular disease, unspecified: Secondary | ICD-10-CM | POA: Diagnosis present

## 2018-07-09 DIAGNOSIS — Z96651 Presence of right artificial knee joint: Secondary | ICD-10-CM | POA: Diagnosis present

## 2018-07-09 DIAGNOSIS — Z7902 Long term (current) use of antithrombotics/antiplatelets: Secondary | ICD-10-CM | POA: Diagnosis not present

## 2018-07-09 DIAGNOSIS — Z90710 Acquired absence of both cervix and uterus: Secondary | ICD-10-CM | POA: Diagnosis not present

## 2018-07-09 DIAGNOSIS — I5032 Chronic diastolic (congestive) heart failure: Secondary | ICD-10-CM | POA: Diagnosis present

## 2018-07-09 DIAGNOSIS — R001 Bradycardia, unspecified: Secondary | ICD-10-CM | POA: Diagnosis not present

## 2018-07-09 DIAGNOSIS — M1712 Unilateral primary osteoarthritis, left knee: Secondary | ICD-10-CM | POA: Diagnosis present

## 2018-07-09 DIAGNOSIS — Z79899 Other long term (current) drug therapy: Secondary | ICD-10-CM | POA: Diagnosis not present

## 2018-07-09 DIAGNOSIS — E785 Hyperlipidemia, unspecified: Secondary | ICD-10-CM | POA: Diagnosis present

## 2018-07-09 LAB — CBC
HCT: 29.7 % — ABNORMAL LOW (ref 36.0–46.0)
Hemoglobin: 9.8 g/dL — ABNORMAL LOW (ref 12.0–15.0)
MCH: 29 pg (ref 26.0–34.0)
MCHC: 33 g/dL (ref 30.0–36.0)
MCV: 87.9 fL (ref 80.0–100.0)
PLATELETS: 142 10*3/uL — AB (ref 150–400)
RBC: 3.38 MIL/uL — AB (ref 3.87–5.11)
RDW: 14.9 % (ref 11.5–15.5)
WBC: 6.3 10*3/uL (ref 4.0–10.5)
nRBC: 0 % (ref 0.0–0.2)

## 2018-07-09 LAB — BASIC METABOLIC PANEL
Anion gap: 5 (ref 5–15)
BUN: 11 mg/dL (ref 8–23)
CO2: 22 mmol/L (ref 22–32)
Calcium: 7 mg/dL — ABNORMAL LOW (ref 8.9–10.3)
Chloride: 115 mmol/L — ABNORMAL HIGH (ref 98–111)
Creatinine, Ser: 0.62 mg/dL (ref 0.44–1.00)
Glucose, Bld: 111 mg/dL — ABNORMAL HIGH (ref 70–99)
Potassium: 2.9 mmol/L — ABNORMAL LOW (ref 3.5–5.1)
Sodium: 142 mmol/L (ref 135–145)

## 2018-07-09 MED ORDER — POTASSIUM CHLORIDE 20 MEQ PO PACK: 40.0000 meq | PACK | Freq: Two times a day (BID) | ORAL | Status: DC

## 2018-07-09 MED ORDER — DIGOXIN 125 MCG PO TABS
0.2500 mg | ORAL_TABLET | Freq: Every day | ORAL | Status: DC
  Administered 2018-07-09: 0.25 mg via ORAL
  Filled 2018-07-09: qty 2

## 2018-07-09 MED ORDER — KCL IN DEXTROSE-NACL 20-5-0.45 MEQ/L-%-% IV SOLN
INTRAVENOUS | Status: DC
  Administered 2018-07-09 (×2): via INTRAVENOUS
  Filled 2018-07-09 (×2): qty 1000

## 2018-07-09 MED ORDER — METHOCARBAMOL 500 MG PO TABS
250.0000 mg | ORAL_TABLET | Freq: Two times a day (BID) | ORAL | Status: DC | PRN
  Administered 2018-07-09 – 2018-07-10 (×2): 250 mg via ORAL
  Filled 2018-07-09 (×2): qty 1

## 2018-07-09 NOTE — Evaluation (Signed)
Physical Therapy Evaluation Patient Details Name: Sara [name] MRN: [id number] DOB: 1926-03-17 Today's Date: 07/09/2018   History of Present Illness  Pt is a 85 yo female who presents for L TKA in setting of not ambulating since 6/19 due to knee dysfunction. PMH: afib, HTN, HLD  Clinical Impression  Pt is s/p TKA resulting in the deficits listed below (see PT Problem List). Pt needs motivational cues to agree to mobility. Mod A for bed mobility as well as sit to stand and pivot to chair. Discussed positioning at length as pt tends to hold knee partially flexed in bed and per RN will not allow L foot in zero knee foam for very long at a time.  Pt will benefit from skilled PT to increase their independence and safety with mobility to allow discharge to the venue listed below.      Follow Up Recommendations SNF;Supervision for mobility/OOB    Equipment Recommendations  None recommended by PT    Recommendations for Other Services       Precautions / Restrictions Precautions Precautions: Fall;Knee Precaution Booklet Issued: Yes (comment) Precaution Comments: reviewed proper positioning ok knee , pt tending to keep knee partially flexed in bed Required Braces or Orthoses: Knee Immobilizer - Left Knee Immobilizer - Left: On except when in CPM Restrictions Weight Bearing Restrictions: Yes LLE Weight Bearing: Weight bearing as tolerated      Mobility  Bed Mobility Overal bed mobility: Needs Assistance Bed Mobility: Supine to Sit     Supine to sit: Mod assist     General bed mobility comments: mod A to pivot to EOB, pt able to manage RLE and upper body but needed facilitation at hips to scoot fwd  Transfers Overall transfer level: Needs assistance Equipment used: Rolling walker (2 wheeled) Transfers: Sit to/from [facility] Sit to Stand: Mod assist Stand pivot transfers: Mod assist       General transfer comment: mod A for power up to RW, pt unable to  step feet so sat back down and moved RW, mod A to stand and pivot feet to chair with therapist directly in front of pt with arms under her arms for support  Ambulation/Gait             General Gait Details: unable at this point  Stairs            Wheelchair Mobility    Modified Rankin (Stroke Patients Only)       Balance Overall balance assessment: Needs assistance Sitting-balance support: Single extremity supported Sitting balance-Leahy Scale: Poor Sitting balance - Comments: needed UE support EOB Postural control: Posterior lean Standing balance support: Bilateral upper extremity supported Standing balance-Leahy Scale: Poor Standing balance comment: mod A to maintain standing                             Pertinent Vitals/Pain Pain Assessment: Faces Faces Pain Scale: Hurts whole lot Pain Location: L knee Pain Descriptors / Indicators: Aching;Operative site guarding;Grimacing Pain Intervention(s): Limited activity within patient's tolerance;Monitored during session    Home Living Family/patient expects to be discharged to:: Private residence Living Arrangements: Children Available Help at Discharge: Family;Available PRN/intermittently Type of Home: House Home Access: Stairs to enter;Ramped entrance Entrance Stairs-Rails: None Entrance Stairs-Number of Steps: 3 Home Layout: One level Home Equipment: Walker - 2 wheels;Cane - single point;Wheelchair - manual Additional Comments: pt lives with her son but he works in the day  Prior Function Level of Independence: Independent with assistive device(s)         Comments: has not walked since 6/19. Has been standing and pivoting to w/c and standing at sink to fix food.      Hand Dominance        Extremity/Trunk Assessment   Upper Extremity Assessment Upper Extremity Assessment: Generalized weakness    Lower Extremity Assessment Lower Extremity Assessment: Generalized weakness;LLE  deficits/detail LLE Deficits / Details: hip flex 2-/5, pt able to get knee to 10 deg ext, <30 deg active knee flex LLE: Unable to fully assess due to pain LLE Sensation: WNL LLE Coordination: decreased fine motor;decreased gross motor    Cervical / Trunk Assessment Cervical / Trunk Assessment: Kyphotic  Communication   Communication: HOH  Cognition Arousal/Alertness: Awake/alert Behavior During Therapy: WFL for tasks assessed/performed Overall Cognitive Status: Within Functional Limits for tasks assessed                                 General Comments: pt needs many motivational cues      General Comments General comments (skin integrity, edema, etc.): HR in mid 70's, SpO2 91% on RA, replaced O2 after session and sats into mid to upper 90's    Exercises Total Joint Exercises Ankle Circles/Pumps: AROM;Both;10 reps;Seated Quad Sets: AROM;Both;10 reps;Seated Goniometric ROM: 10-30 AROM   Assessment/Plan    PT Assessment Patient needs continued PT services  PT Problem List Decreased strength;Decreased range of motion;Decreased activity tolerance;Decreased balance;Decreased mobility;Decreased coordination;Decreased knowledge of use of DME;Decreased safety awareness;Decreased knowledge of precautions;Pain       PT Treatment Interventions DME instruction;Gait training;Functional mobility training;Therapeutic activities;Therapeutic exercise;Balance training;Neuromuscular re-education;Patient/family education    PT Goals (Current goals can be found in the Care Plan section)  Acute Rehab PT Goals Patient Stated Goal: rehab then home PT Goal Formulation: With patient Time For Goal Achievement: 07/23/18 Potential to Achieve Goals: Fair    Frequency Min 5X/week   Barriers to discharge Decreased caregiver support home alone in the day    Co-evaluation               AM-PAC PT "6 Clicks" Mobility  Outcome Measure Help needed turning from your back to your  side while in a flat bed without using bedrails?: A Lot Help needed moving from lying on your back to sitting on the side of a flat bed without using bedrails?: A Lot Help needed moving to and from a bed to a chair (including a wheelchair)?: A Lot Help needed standing up from a chair using your arms (e.g., wheelchair or bedside chair)?: A Lot Help needed to walk in hospital room?: Total Help needed climbing 3-5 steps with a railing? : Total 6 Click Score: 10    End of Session Equipment Utilized During Treatment: Gait belt;Left knee immobilizer Activity Tolerance: Patient limited by pain Patient left: in chair;with call bell/phone within reach;with chair alarm set Nurse Communication: Mobility status PT Visit Diagnosis: Muscle weakness (generalized) (M62.81);Difficulty in walking, not elsewhere classified (R26.2);Pain Pain - Right/Left: Left Pain - part of body: Knee    Time: 1131-1158 PT Time Calculation (min) (ACUTE ONLY): 27 min   Charges:   PT Evaluation $PT Eval Moderate Complexity: 1 Mod PT Treatments $Neuromuscular Re-education: 8-22 mins        [place]  Pager [phone] Office [place] 07/09/2018, 1:16 PM

## 2018-07-09 NOTE — Progress Notes (Signed)
Progress Note  Patient Name: Sara [name] Date of Encounter: 07/09/2018  Primary Cardiologist: Glenetta Hew, MD   Subjective   Denies palpitations   Breathing is OK  No CP   Starting to eat   Inpatient Medications    Scheduled Meds: . aspirin EC  325 mg Oral Q breakfast  . carvedilol  6.25 mg Oral BID  . clopidogrel  75 mg Oral Q breakfast  . docusate sodium  100 mg Oral BID  . furosemide  40 mg Oral BID  . potassium chloride SA  40 mEq Oral BID  . rOPINIRole  1 mg Oral QHS   Continuous Infusions: . sodium chloride 70 mL/hr at 07/09/18 0637   PRN Meds: acetaminophen, HYDROcodone-acetaminophen, menthol-cetylpyridinium **OR** phenol, metoCLOPramide **OR** metoCLOPramide (REGLAN) injection, ondansetron **OR** ondansetron (ZOFRAN) IV, polyethylene glycol   Vital Signs    Vitals:   07/08/18 1638 07/08/18 1951 07/08/18 1957 07/09/18 0704  BP: 129/73 (!) 112/39 (!) 108/50 (!) 91/39  Pulse: (!) 55 60 (!) 57 (!) 58  Resp:  15 20 16   Temp: (!) 97 F (36.1 C)  (!) 97.5 F (36.4 C) (!) 97.5 F (36.4 C)  TempSrc: Oral  Oral Oral  SpO2: 100% 100% 99% 100%  Weight: 67.1 kg     Height: 5' (1.524 m)       Intake/Output Summary (Last 24 hours) at 07/09/2018 0719 Last data filed at 07/09/2018 0430 Gross per 24 hour  Intake 1630.84 ml  Output 1475 ml  Net 155.84 ml   Last 3 Weights 07/08/2018 07/08/2018 06/28/2018  Weight (lbs) 147 lb 14.9 oz 148 lb 148 lb  Weight (kg) 67.1 kg 67.132 kg 67.132 kg      Telemetry    afib 70s - Personally Reviewed  ECG      Physical Exam   GEN: No acute distress.   Neck: No JVD Cardiac: Irreg irreg , no murmurs, rubs, or gallops.  Respiratory: Clear to auscultation bilaterally. GI: Soft, nontender, non-distended  MS: No edema; No deformity. Neuro:  Nonfocal  Psych: Normal affect   Labs    Chemistry Recent Labs  Lab 07/08/18 1838 07/09/18 0327  NA 141 142  K 3.9 2.9*  CL 110 115*  CO2 22 22  GLUCOSE 105* 111*  BUN 13  11  CREATININE 0.66 0.62  CALCIUM 8.5* 7.0*  GFRNONAA >60 >60  GFRAA >60 >60  ANIONGAP 9 5     Hematology Recent Labs  Lab 07/08/18 1838 07/09/18 0327  WBC 9.9 6.3  RBC 4.33 3.38*  HGB 12.6 9.8*  HCT 38.0 29.7*  MCV 87.8 87.9  MCH 29.1 29.0  MCHC 33.2 33.0  RDW 14.6 14.9  PLT 175 142*    Cardiac Enzymes Recent Labs  Lab 07/08/18 1838  TROPONINI <0.03   No results for input(s): TROPIPOC in the last 168 hours.   BNPNo results for input(s): BNP, PROBNP in the last 168 hours.   DDimer No results for input(s): DDIMER in the last 168 hours.   Radiology    Dg Knee 1-2 Views Left  Result Date: 07/08/2018 CLINICAL DATA:  Postop total knee arthroplasty. EXAM: LEFT KNEE - 1-2 VIEW COMPARISON:  None. FINDINGS: The tibial and femoral components are well seated. No complicating features are identified. No periprosthetic fracture. A long femoral artery and popliteal artery stent is noted. IMPRESSION: Well seated components of a total left knee arthroplasty without complicating features. Electronically Signed   By: Marijo Sanes M.D.   On:  07/08/2018 16:06    Cardiac Studies     Patient Profile     85 y.o. female hx of chronic atrial fibrillation (rate controlled, not on anticoagulation)  Assessment & Plan    1  Atrial fibrillation   Rates are good   I backed down on b blocker   She is now off  (BP fragile)  She may have gotten 0.25 mg digoxin today   Lelvel yesterday was 0.5   THis is high  The dose 0.25 is TOO high for a 85 yo  REcomm  Stop dig Watch BP   WOuld probably use metoprolol instead of carvedilol for heart rate lowering with less bp lowering   WOuld not use now   Reassess in AM   If goes fast later today then start PO 25     2  PAD  COnt  ASA and Plavix  Back down on IV fluids as she is taking PO  For questions or updates, please contact [facility] Please consult [email or web address] for contact info under        Signed, Dorris Carnes, MD  07/09/2018, 7:19 AM

## 2018-07-09 NOTE — Progress Notes (Signed)
Pt received from PACU. Shortly after pt in room she c/o dizziness and face noticebly pale. CCMD called several times during this period to report bradycardia of 30s and pause of 2.7 second. Pt denied any other symptoms at that time. Apical pulse noted as 39-41 with irregular rhythm. Called for rapid nurse and notified attending and cardiology MDs. New orders and assessment noted. Pt son at bedside stating he does not "get excited" because pt experiences "these episodes" all the time. EKG done with AFIB and HR of 60s. Pt then stated relief from dizziness and color returned to normal. Was seen by Cardiologist at bedside. Will continue to monitor.

## 2018-07-09 NOTE — NC FL2 (Signed)
[place] LEVEL OF CARE SCREENING TOOL     IDENTIFICATION  Patient Name: Sara [name] Birthdate: [birth year] Sex: female Admission Date (Current Location): 07/08/2018  [facility] and [place] Number:  [employer] and Address:  [facility] [place], [place] [address] [place], [place] [address]      Provider Number: [id number]  Attending Physician Name and Address:  Marybelle Killings, MD  Relative Name and Phone Number:  Patrick Jupiter, son, [phone]    Current Level of Care: Hospital Recommended Level of Care: [place] Prior Approval Number:    Date Approved/Denied:   PASRR Number:    Discharge Plan: SNF    Current Diagnoses: Patient Active Problem List   Diagnosis Date Noted  . Arthritis of left knee 07/08/2018  . Left hand pain 04/23/2018  . Claudication in peripheral vascular disease ([place]) 03/18/2018  . Peripheral arterial disease ([place]) 02/27/2018  . Preop cardiovascular exam 01/19/2018  . Unilateral primary osteoarthritis, left knee 01/08/2018  . Hx of total knee arthroplasty, right 11/16/2017  . Hypokalemia   . Chronic diastolic CHF (congestive heart failure), NYHA class 1 ([place]) 05/16/2016  . Gait abnormality 04/11/2016  . History of breast cancer 04/11/2016  . Persistent atrial fibrillation;; CHA2DS2-VASc Score and unadjusted Ischemic Stroke Rate (% per year) is equal to 4.8 % stroke rate/year from a score of 4 03/29/2016  . Paroxysmal atrial fibrillation ([place]) 03/29/2016  . Atypical syncope 03/26/2016  . 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 Obesity (BMI 30-39.9) 03/18/2013  . Essential hypertension 03/18/2013    Orientation RESPIRATION BLADDER Height & Weight     Self, Time, Situation, Place  O2(Nasal cannula 3L) Incontinent Weight: 67.1 kg Height:  5' (152.4 cm)  BEHAVIORAL SYMPTOMS/MOOD NEUROLOGICAL BOWEL NUTRITION STATUS      Continent Diet(Please see DC Summary)  AMBULATORY STATUS COMMUNICATION OF  NEEDS Skin   Limited Assist Verbally Surgical wounds(Closed incision on knee)                       Personal Care Assistance Level of Assistance  Bathing, Feeding, Dressing Bathing Assistance: Maximum assistance Feeding assistance: Independent Dressing Assistance: Limited assistance     Functional Limitations Info  Sight, Speech, Hearing Sight Info: Adequate Hearing Info: Adequate Speech Info: Adequate    SPECIAL CARE FACTORS FREQUENCY  PT (By licensed PT), OT (By licensed OT)     PT Frequency: 5x/week OT Frequency: 3x/week            Contractures Contractures Info: Not present    Additional Factors Info  Code Status, Allergies Code Status Info: Full Allergies Info: Amiodarone, Al[place], Amlodipine Besylate-valsartan, Bystolic Nebivolol Hcl, Clonidine Derivatives, Dronedarone, Hctz Hydrochlorothiazide, Hydralazine, Lisinopril, Nebivolol, Olmesartan, Rythmol Propafenone, Statins, Carvedilol, Prednisone           Current Medications (07/09/2018):  This is the current hospital active medication list Current Facility-Administered Medications  Medication Dose Route Frequency Provider Last Rate Last Dose  . acetaminophen (TYLENOL) tablet 325-650 mg  325-650 mg Oral Q6H PRN Lanae Crumbly, PA-C   650 mg at 07/08/18 2103  . aspirin EC tablet 325 mg  325 mg Oral Q breakfast Lanae Crumbly, PA-C   325 mg at 07/09/18 4166  . clopidogrel (PLAVIX) tablet 75 mg  75 mg Oral Q breakfast Lanae Crumbly, PA-C   75 mg at 07/09/18 0630  . dextrose 5 % and 0.45 % NaCl with KCl 20 mEq/L infusion   Intravenous  Continuous Marybelle Killings, MD 75 mL/hr at 07/09/18 [phone]    . docusate sodium (COLACE) capsule 100 mg  100 mg Oral BID Lanae Crumbly, PA-C   100 mg at 07/08/18 2104  . furosemide (LASIX) tablet 40 mg  40 mg Oral BID Lanae Crumbly, PA-C   40 mg at 07/09/18 9326  . HYDROcodone-acetaminophen (NORCO/VICODIN) 5-325 MG per tablet 1 tablet  1 tablet Oral Q6H PRN Lanae Crumbly, PA-C   1  tablet at 07/09/18 1202  . menthol-cetylpyridinium (CEPACOL) lozenge 3 mg  1 lozenge Oral PRN Lanae Crumbly, PA-C       Or  . phenol (CHLORASEPTIC) mouth spray 1 spray  1 spray Mouth/Throat PRN Lanae Crumbly, PA-C      . metoCLOPramide (REGLAN) tablet 5-10 mg  5-10 mg Oral Q8H PRN Lanae Crumbly, PA-C       Or  . metoCLOPramide (REGLAN) injection 5-10 mg  5-10 mg Intravenous Q8H PRN Benjiman Core M, PA-C      . ondansetron [facility]) tablet 4 mg  4 mg Oral Q6H PRN Lanae Crumbly, PA-C       Or  . ondansetron [facility]) injection 4 mg  4 mg Intravenous Q6H PRN Benjiman Core M, PA-C      . polyethylene glycol (MIRALAX / GLYCOLAX) packet 17 g  17 g Oral Daily PRN Benjiman Core M, PA-C      . potassium chloride SA (K-DUR,KLOR-CON) CR tablet 40 mEq  40 mEq Oral BID Lanae Crumbly, PA-C   40 mEq at 07/09/18 7124  . rOPINIRole (REQUIP) tablet 1 mg  1 mg Oral QHS Benjiman Core M, PA-C   1 mg at 07/08/18 2104     Discharge Medications: Please see discharge summary for a list of discharge medications.  Relevant Imaging Results:  Relevant Lab Results:   Additional Information SSN: [place], [place]

## 2018-07-09 NOTE — Progress Notes (Signed)
   Subjective: 1 Day Post-Op Procedure(s) (LRB): LEFT TOTAL KNEE ARTHROPLASTY CEMENTED (Left) Patient reports pain as moderate.    Objective: Vital signs in last 24 hours: Temp:  [97 F (36.1 C)-97.8 F (36.6 C)] 97.5 F (36.4 C) (01/21 0704) Pulse Rate:  [55-72] 58 (01/21 0704) Resp:  [11-20] 16 (01/21 0704) BP: (90-170)/(39-82) 91/39 (01/21 0704) SpO2:  [95 %-100 %] 100 % (01/21 0704) Weight:  [67.1 kg] 67.1 kg (01/20 1638)  Intake/Output from previous day: 01/20 0701 - 01/21 0700 In: 1630.8 [I.V.:1580.8; IV Piggyback:50] Out: 4497 [Urine:1275; Blood:200] Intake/Output this shift: No intake/output data recorded.  Recent Labs    07/08/18 1838 07/09/18 0327  HGB 12.6 9.8*   Recent Labs    07/08/18 1838 07/09/18 0327  WBC 9.9 6.3  RBC 4.33 3.38*  HCT 38.0 29.7*  PLT 175 142*   Recent Labs    07/08/18 1838 07/09/18 0327  NA 141 142  K 3.9 2.9*  CL 110 115*  CO2 22 22  BUN 13 11  CREATININE 0.66 0.62  GLUCOSE 105* 111*  CALCIUM 8.5* 7.0*   No results for input(s): LABPT, INR in the last 72 hours.  Neurologically intact good cap refill foot , brisk.  Dg Knee 1-2 Views Left  Result Date: 07/08/2018 CLINICAL DATA:  Postop total knee arthroplasty. EXAM: LEFT KNEE - 1-2 VIEW COMPARISON:  None. FINDINGS: The tibial and femoral components are well seated. No complicating features are identified. No periprosthetic fracture. A long femoral artery and popliteal artery stent is noted. IMPRESSION: Well seated components of a total left knee arthroplasty without complicating features. Electronically Signed   By: Marijo Sanes M.D.   On: 07/08/2018 16:06    Assessment/Plan: 1 Day Post-Op Procedure(s) (LRB): LEFT TOTAL KNEE ARTHROPLASTY CEMENTED (Left) Up with therapy. Due to arterial blockage and stent and severe knee OA she has not walked in 6 months.  Plan SNF.   Supplement potassium.   Marybelle Killings 07/09/2018, 7:54 AM

## 2018-07-09 NOTE — Anesthesia Postprocedure Evaluation (Signed)
Anesthesia Post Note  Patient: Sara [name]  Procedure(s) Performed: LEFT TOTAL KNEE ARTHROPLASTY CEMENTED (Left )     Patient location during evaluation: PACU Anesthesia Type: Spinal Level of consciousness: oriented and awake and alert Pain management: pain level controlled Vital Signs Assessment: post-procedure vital signs reviewed and stable Respiratory status: spontaneous breathing, respiratory function stable and patient connected to nasal cannula oxygen Cardiovascular status: blood pressure returned to baseline and stable Postop Assessment: no headache, no backache and no apparent nausea or vomiting Anesthetic complications: no    Last Vitals:  Vitals:   07/09/18 0704 07/09/18 0833  BP: (!) 91/39 (!) 104/50  Pulse: (!) 58 (!) 126  Resp: 16 18  Temp: (!) 36.4 C 36.4 C  SpO2: 100% 98%    Last Pain:  Vitals:   07/09/18 0833  TempSrc: Oral  PainSc:                  [name]

## 2018-07-09 NOTE — Clinical Social Work Note (Signed)
Clinical Social Work Assessment  Patient Details  Name: Sara [name] MRN: [id number] Date of Birth: Mar 01, 1926  Date of referral:  07/09/18               Reason for consult:  Facility Placement                Permission sought to share information with:  Facility Art therapist granted to share information::  Yes, Verbal Permission Granted  Name::        Agency::  SNFs  Relationship::  Son  Contact Information:     Housing/Transportation Living arrangements for the past 2 months:  Single Family Home Source of Information:  Patient Patient Interpreter Needed:  None Criminal Activity/Legal Involvement Pertinent to Current Situation/Hospitalization:  No - Comment as needed Significant Relationships:  Adult Children, Siblings Lives with:  Self Do you feel safe going back to the place where you live?  No Need for family participation in patient care:  No (Coment)  Care giving concerns:  CSW received consult for possible SNF placement at time of discharge. CSW spoke with patient regarding PT recommendation of SNF placement at time of discharge. Patient reported that she lives with her son but he is working and unable to care for her. Patient expressed understanding of PT recommendation and is agreeable to SNF placement at time of discharge. CSW to continue to follow and assist with discharge planning needs.   Social Worker assessment / plan:  CSW spoke with patient concerning possibility of rehab at [facility] before returning home.  Employment status:  Retired Nurse, adult PT Recommendations:  [place] / Referral to community resources:  [place]  Patient/Family's Response to care:  Patient recognizes need for rehab before returning home and is agreeable to a SNF in [place]. Patient reported preference for [facility]. They are able to accept patient and will begin insurance  authorization process.   Patient/Family's Understanding of and Emotional Response to Diagnosis, Current Treatment, and Prognosis:  Patient/family is realistic regarding therapy needs and expressed being hopeful for SNF placement. Patient expressed understanding of CSW role and discharge process as well as medical condition. No questions/concerns about plan or treatment.    Emotional Assessment Appearance:  Appears stated age Attitude/Demeanor/Rapport:  Engaged Affect (typically observed):  Accepting, Appropriate Orientation:  Oriented to Self, Oriented to Place, Oriented to  Time, Oriented to Situation Alcohol / Substance use:  Not Applicable Psych involvement (Current and /or in the community):  No (Comment)  Discharge Needs  Concerns to be addressed:  Care Coordination Readmission within the last 30 days:  No Current discharge risk:  Dependent with Mobility Barriers to Discharge:  Continued Medical Work up   [employer], LCSW 07/09/2018, 4:53 PM

## 2018-07-09 NOTE — Plan of Care (Signed)
  Problem: Education: Goal: Knowledge of General Education information will improve Description Including pain rating scale, medication(s)/side effects and non-pharmacologic comfort measures Outcome: Progressing   Problem: Health Behavior/Discharge Planning: Goal: Ability to manage health-related needs will improve Outcome: Progressing   

## 2018-07-09 NOTE — Progress Notes (Signed)
Pt assisted from chair to bed with x2 staff members. Is only able to bear weight and unable to take a step. Had to pivot pt to bed. Pt c/o 10/10 pain to L knee. States pain meds are not working much. Notified MD and new orders obtained. Given robaxin and repositioned in bed. Will continue to monitor.

## 2018-07-10 LAB — CBC
HCT: 32.8 % — ABNORMAL LOW (ref 36.0–46.0)
Hemoglobin: 10.9 g/dL — ABNORMAL LOW (ref 12.0–15.0)
MCH: 29.1 pg (ref 26.0–34.0)
MCHC: 33.2 g/dL (ref 30.0–36.0)
MCV: 87.7 fL (ref 80.0–100.0)
NRBC: 0 % (ref 0.0–0.2)
Platelets: 153 10*3/uL (ref 150–400)
RBC: 3.74 MIL/uL — AB (ref 3.87–5.11)
RDW: 14.9 % (ref 11.5–15.5)
WBC: 8.5 10*3/uL (ref 4.0–10.5)

## 2018-07-10 LAB — BASIC METABOLIC PANEL
Anion gap: 7 (ref 5–15)
BUN: 11 mg/dL (ref 8–23)
CO2: 23 mmol/L (ref 22–32)
Calcium: 8.3 mg/dL — ABNORMAL LOW (ref 8.9–10.3)
Chloride: 107 mmol/L (ref 98–111)
Creatinine, Ser: 0.77 mg/dL (ref 0.44–1.00)
GFR calc Af Amer: >60 mL/min (ref >60)
GFR calc non Af Amer: >60 mL/min (ref >60)
Glucose, Bld: 152 mg/dL — ABNORMAL HIGH (ref 70–99)
POTASSIUM: 4.1 mmol/L (ref 3.5–5.1)
Sodium: 137 mmol/L (ref 135–145)

## 2018-07-10 NOTE — Progress Notes (Signed)
Progress Note  Patient Name: Sara [name] Date of Encounter: 07/10/2018  Primary Cardiologist: Glenetta Hew, MD   Subjective   Pt nauseated this morning   Feels bad   Felt better yesterday  No CP   Inpatient Medications    Scheduled Meds: . aspirin EC  325 mg Oral Q breakfast  . clopidogrel  75 mg Oral Q breakfast  . docusate sodium  100 mg Oral BID  . furosemide  40 mg Oral BID  . potassium chloride SA  40 mEq Oral BID  . rOPINIRole  1 mg Oral QHS   Continuous Infusions: . dextrose 5 % and 0.45 % NaCl with KCl 20 mEq/L 75 mL/hr at 07/09/18 2207   PRN Meds: acetaminophen, HYDROcodone-acetaminophen, menthol-cetylpyridinium **OR** phenol, methocarbamol, metoCLOPramide **OR** metoCLOPramide (REGLAN) injection, ondansetron **OR** ondansetron (ZOFRAN) IV, polyethylene glycol   Vital Signs    Vitals:   07/09/18 0833 07/09/18 1944 07/10/18 0212 07/10/18 0329  BP: (!) 104/50 (!) 115/42  128/61  Pulse: (!) 126 77  (!) 54  Resp: 18 18  20   Temp: 97.6 F (36.4 C) 98 F (36.7 C)  98.3 F (36.8 C)  TempSrc: Oral Oral  Oral  SpO2: 98% 91% 97% 98%  Weight:      Height:        Intake/Output Summary (Last 24 hours) at 07/10/2018 0818 Last data filed at 07/10/2018 0500 Gross per 24 hour  Intake 2541.57 ml  Output 720 ml  Net 1821.57 ml   Last 3 Weights 07/08/2018 07/08/2018 06/28/2018  Weight (lbs) 147 lb 14.9 oz 148 lb 148 lb  Weight (kg) 67.1 kg 67.132 kg 67.132 kg      Telemetry    AFib  60 to 70s  - Personally Reviewed  ECG      Physical Exam   GEN: Pt appears uncomfotable  Neck: No JVD Cardiac: Irreg irreg , no murmurs, rubs, or gallops.  Respiratory: Clear to auscultation bilaterally. GI: Soft, nontender, non-distended  MS: No edema; No deformity. Neuro:  Nonfocal  Psych: Normal affect   Labs    Chemistry Recent Labs  Lab 07/08/18 1838 07/09/18 0327 07/10/18 0225  NA 141 142 137  K 3.9 2.9* 4.1  CL 110 115* 107  CO2 22 22 23   GLUCOSE 105*  111* 152*  BUN 13 11 11   CREATININE 0.66 0.62 0.77  CALCIUM 8.5* 7.0* 8.3*  GFRNONAA >60 >60 >60  GFRAA >60 >60 >60  ANIONGAP 9 5 7      Hematology Recent Labs  Lab 07/08/18 1838 07/09/18 0327 07/10/18 0225  WBC 9.9 6.3 8.5  RBC 4.33 3.38* 3.74*  HGB 12.6 9.8* 10.9*  HCT 38.0 29.7* 32.8*  MCV 87.8 87.9 87.7  MCH 29.1 29.0 29.1  MCHC 33.2 33.0 33.2  RDW 14.6 14.9 14.9  PLT 175 142* 153    Cardiac Enzymes Recent Labs  Lab 07/08/18 1838  TROPONINI <0.03   No results for input(s): TROPIPOC in the last 168 hours.   BNPNo results for input(s): BNP, PROBNP in the last 168 hours.   DDimer No results for input(s): DDIMER in the last 168 hours.   Radiology    Dg Knee 1-2 Views Left  Result Date: 07/08/2018 CLINICAL DATA:  Postop total knee arthroplasty. EXAM: LEFT KNEE - 1-2 VIEW COMPARISON:  None. FINDINGS: The tibial and femoral components are well seated. No complicating features are identified. No periprosthetic fracture. A long femoral artery and popliteal artery stent is noted. IMPRESSION: Well seated  components of a total left knee arthroplasty without complicating features. Electronically Signed   By: Marijo Sanes M.D.   On: 07/08/2018 16:06    Cardiac Studies     Patient Profile     85 y.o. female hx of chronic atrial fibrillation (rate controlled, not on anticoagulation)  Assessment & Plan    1  Atrial fibrillation  Rates are controlled OFF OF ALL MEDS    Follow  Normal LV function on echo in 2017   WIll stip lasix for now As well as potassium     Follow HR/BP and fluid  2  PAD  COnt  ASA and Plavix  Wil stop IV now but if not taking po may need to restart     For questions or updates, please contact [facility] Please consult [email or web address] for contact info under        Signed, Dorris Carnes, MD  07/10/2018, 8:18 AM

## 2018-07-10 NOTE — Progress Notes (Signed)
PASRR number found as patient goes by Sara [name]  PASRR number: [id number]  Raini Tiley, [place]

## 2018-07-10 NOTE — Progress Notes (Signed)
Physical Therapy Treatment Patient Details Name: Navayah Sok MRN: [id number] DOB: 18-Feb-1926 Today's Date: 07/10/2018    History of Present Illness Pt is a 85 yo female who presents for L TKA in setting of not ambulating since 6/19 due to knee dysfunction. PMH: afib, HTN, HLD    PT Comments    Patient seen for mobility progression. Pt able to ambulate 8 ft with mod A +2 for safety/chair follow. Continue to progress as tolerated with anticipated d/c to SNF for further skilled PT services.     Follow Up Recommendations  SNF;Supervision for mobility/OOB     Equipment Recommendations  None recommended by PT    Recommendations for Other Services       Precautions / Restrictions Precautions Precautions: Fall;Knee Precaution Booklet Issued: Yes (comment) Precaution Comments: precautions/positioning reviewed with pt Required Braces or Orthoses: Knee Immobilizer - Left;Other Brace(readjusted ) Knee Immobilizer - Left: On except when in CPM Restrictions Weight Bearing Restrictions: Yes LLE Weight Bearing: Weight bearing as tolerated    Mobility  Bed Mobility Overal bed mobility: Needs Assistance Bed Mobility: Supine to Sit     Supine to sit: Mod assist     General bed mobility comments: assist to bring L LE and hips to EOB and to elevate trunk into sitting; cues for sequencing and technique; use of rail   Transfers Overall transfer level: Needs assistance Equipment used: Rolling walker (2 wheeled) Transfers: Sit to/from [facility] Sit to Stand: Mod assist;From elevated surface;+2 safety/equipment Stand pivot transfers: Mod assist;+2 safety/equipment       General transfer comment: assistance required to power up into standing and to achieve upright posture upon standing; cues for posture and L LE positioning; assist for balance and guiding RW to pivot; cues for sequencing  Ambulation/Gait Ambulation/Gait assistance: Mod assist;+2  safety/equipment(chair follow) Gait Distance (Feet): 8 Feet Assistive device: Rolling walker (2 wheeled) Gait Pattern/deviations: Step-to pattern;Decreased stance time - left;Decreased step length - right;Decreased weight shift to left;Trunk flexed;Antalgic     General Gait Details: multimodal cues for posture and vc for sequencing; assist for balance and guiding RW; heavy reliance on UE support   Stairs             Wheelchair Mobility    Modified Rankin (Stroke Patients Only)       Balance Overall balance assessment: Needs assistance Sitting-balance support: Single extremity supported;Feet supported Sitting balance-Leahy Scale: Poor     Standing balance support: Bilateral upper extremity supported Standing balance-Leahy Scale: Poor                              Cognition Arousal/Alertness: Awake/alert Behavior During Therapy: WFL for tasks assessed/performed Overall Cognitive Status: Within Functional Limits for tasks assessed                                 General Comments: needs lots of encouragement; repeats "i'm too weak to do this today"      Exercises      General Comments        Pertinent Vitals/Pain Pain Assessment: Faces Faces Pain Scale: Hurts little more Pain Location: L knee Pain Descriptors / Indicators: Guarding;Sore Pain Intervention(s): Limited activity within patient's tolerance;Monitored during session;Repositioned    Home Living                      Prior  Function            PT Goals (current goals can now be found in the care plan section) Acute Rehab PT Goals Patient Stated Goal: rehab then home Progress towards PT goals: Progressing toward goals    Frequency    Min 5X/week      PT Plan Current plan remains appropriate    Co-evaluation              AM-PAC PT "6 Clicks" Mobility   Outcome Measure  Help needed turning from your back to your side while in a flat bed without  using bedrails?: A Lot Help needed moving from lying on your back to sitting on the side of a flat bed without using bedrails?: A Lot Help needed moving to and from a bed to a chair (including a wheelchair)?: A Lot Help needed standing up from a chair using your arms (e.g., wheelchair or bedside chair)?: A Lot Help needed to walk in hospital room?: A Lot Help needed climbing 3-5 steps with a railing? : Total 6 Click Score: 11    End of Session Equipment Utilized During Treatment: Gait belt;Left knee immobilizer Activity Tolerance: Patient tolerated treatment well Patient left: in chair;with call bell/phone within reach;with chair alarm set Nurse Communication: Mobility status PT Visit Diagnosis: Muscle weakness (generalized) (M62.81);Difficulty in walking, not elsewhere classified (R26.2);Pain Pain - Right/Left: Left Pain - part of body: Knee     Time: 1040-1105 PT Time Calculation (min) (ACUTE ONLY): 25 min  Charges:  $Gait Training: 23-37 mins                     Earney Navy, PTA Acute Rehabilitation Services Pager: [phone] Office: [phone]     Darliss Cheney 07/10/2018, 12:54 PM

## 2018-07-10 NOTE — Progress Notes (Signed)
CCMD reported RN that pt had ventricular trigeminy.Pt is asymptomatic; no SOB; no chest pain. HR 85; A-fib. Will continue to monitor.

## 2018-07-10 NOTE — Progress Notes (Signed)
   Subjective: 2 Days Post-Op Procedure(s) (LRB): LEFT TOTAL KNEE ARTHROPLASTY CEMENTED (Left) Patient reports pain as moderate.    Objective: Vital signs in last 24 hours: Temp:  [97.6 F (36.4 C)-98.3 F (36.8 C)] 98.3 F (36.8 C) (01/22 0329) Pulse Rate:  [54-126] 54 (01/22 0329) Resp:  [18-20] 20 (01/22 0329) BP: (104-128)/(42-61) 128/61 (01/22 0329) SpO2:  [91 %-98 %] 98 % (01/22 0329)  Intake/Output from previous day: 01/21 0701 - 01/22 0700 In: 2541.6 [P.O.:850; I.V.:1691.6] Out: 720 [Urine:720] Intake/Output this shift: No intake/output data recorded.  Recent Labs    07/08/18 1838 07/09/18 0327 07/10/18 0225  HGB 12.6 9.8* 10.9*   Recent Labs    07/09/18 0327 07/10/18 0225  WBC 6.3 8.5  RBC 3.38* 3.74*  HCT 29.7* 32.8*  PLT 142* 153   Recent Labs    07/09/18 0327 07/10/18 0225  NA 142 137  K 2.9* 4.1  CL 115* 107  CO2 22 23  BUN 11 11  CREATININE 0.62 0.77  GLUCOSE 111* 152*  CALCIUM 7.0* 8.3*   No results for input(s): LABPT, INR in the last 72 hours.  Neurologically intact No results found.  Assessment/Plan: 2 Days Post-Op Procedure(s) (LRB): LEFT TOTAL KNEE ARTHROPLASTY CEMENTED (Left) Up with therapy, dressing change, SNF .   Sara [name] 07/10/2018, 7:50 AM

## 2018-07-10 NOTE — Plan of Care (Signed)
  Problem: Nutrition: Goal: Adequate nutrition will be maintained Outcome: Progressing   Problem: Coping: Goal: Level of anxiety will decrease Outcome: Progressing   

## 2018-07-11 LAB — CBC
HEMATOCRIT: 31.1 % — AB (ref 36.0–46.0)
Hemoglobin: 10.3 g/dL — ABNORMAL LOW (ref 12.0–15.0)
MCH: 29.1 pg (ref 26.0–34.0)
MCHC: 33.1 g/dL (ref 30.0–36.0)
MCV: 87.9 fL (ref 80.0–100.0)
Platelets: 166 10*3/uL (ref 150–400)
RBC: 3.54 MIL/uL — AB (ref 3.87–5.11)
RDW: 15.1 % (ref 11.5–15.5)
WBC: 8 10*3/uL (ref 4.0–10.5)
nRBC: 0 % (ref 0.0–0.2)

## 2018-07-11 LAB — BASIC METABOLIC PANEL
Anion gap: 5 (ref 5–15)
BUN: 13 mg/dL (ref 8–23)
CO2: 26 mmol/L (ref 22–32)
Calcium: 8.5 mg/dL — ABNORMAL LOW (ref 8.9–10.3)
Chloride: 105 mmol/L (ref 98–111)
Creatinine, Ser: 0.82 mg/dL (ref 0.44–1.00)
GFR calc Af Amer: >60 mL/min (ref >60)
GFR calc non Af Amer: >60 mL/min (ref >60)
Glucose, Bld: 122 mg/dL — ABNORMAL HIGH (ref 70–99)
Potassium: 4.4 mmol/L (ref 3.5–5.1)
Sodium: 136 mmol/L (ref 135–145)

## 2018-07-11 MED ORDER — HYDROCODONE-ACETAMINOPHEN 5-325 MG PO TABS: 1.0000 | ORAL_TABLET | Freq: Four times a day (QID) | ORAL | 0 refills | Status: DC | PRN

## 2018-07-11 MED ORDER — ASPIRIN 325 MG PO TBEC: 325.0000 mg | DELAYED_RELEASE_TABLET | Freq: Every day | ORAL | 0 refills | Status: DC

## 2018-07-11 NOTE — Discharge Summary (Signed)
Physician Discharge Summary  Patient ID: Sara [name] MRN: [id number] DOB/AGE: 83-Jul-1927 83 y.o.  Admit date: 07/08/2018 Discharge date: 07/11/2018  Admission Diagnoses: Left knee osteoarthritis  Discharge Diagnoses: Same, post left total knee arthroplasty. Active Problems:   Arthritis of left knee   Discharged Condition: Stable  Hospital Course: Patient had previous right total knee arthroplasty several years ago which is done well.  She had problems with peripheral arterial disease requiring a stent and had not been walking other than just standing to transfer for about 6 months due to end-stage left knee osteoarthritis with bone-on-bone changes.  She presented for left total knee arthroplasty.  After informed consent procedure was performed which she tolerated well.  On transferring from the PACU to the floor she had episode of bradycardia.  She was seen by Dr. Dorris Carnes cardiology consult.  Patient previously had refused anticoagulation.  She had been on aspirin and Plavix.  Rate was in the low 30s that she transfer the floor but then corrected.  Ejection level was 0.5 which is low with normal being 0.8-2.0.  She had been on a CPM machine work with therapy just transferring from bed to chair and was noted to have significant weakness with standing since she had not been amatory for 6 months.  Dressing was changed on 07/11/2018.  Patient was taking hydrocodone for pain.  Rates range from 54-87 the last 48 hours and stable.  Recommendations from cardiology was continue aspirin and Plavix.  Follow-up with cardiology 1 week after discharge.  Admission patient was on Coreg 12.5 twice daily dig 0.125 and diltiazem 180 mg daily.  Cardiology felt it was best to leave these medications off which could potentially lead to some hypotension currently.  Plan is for [facility] for continued knee rehabilitation post total knee arthroplasty.  Patient had low potassium of 2.9 which was supplemented and  then responded back to 4.1.  Consults: Cardiology consultation see above.  Significant Diagnostic Studies: EKG showing atrial fib  Treatments: Total knee arthroplasty as above.  Discharge Exam: Blood pressure (!) 114/44, pulse 78, temperature 98.5 F (36.9 C), temperature source Oral, resp. rate 16, height 5' (1.524 m), weight 67.1 kg, SpO2 90 %. Physical exam: Patient was alert tolerating p.o.'s well.  Able to void after removal of Foley.  Dressing was changed she is working on quad strengthening but showed significant weakness related to her age and lack of mobility for the last several months.  Expect she will need 20-day stay in skilled facility and then likely need additional home health physical therapy after discharge.  Disposition: Discharge disposition: 03-Skilled Nursing Facility     stable   Allergies as of 07/11/2018      Reactions   Amiodarone Shortness Of Breath, Other (See Comments)   Stopped in Feb 2017 secondary to pulmonary complications   Aliskiren Other (See Comments), Nausea Only   unknown   Amlodipine Besylate-valsartan Other (See Comments), Nausea Only   Unclear   Bystolic [nebivolol Hcl] Other (See Comments)   unknown   Clonidine Derivatives Other (See Comments)   unknown   Dronedarone Nausea And Vomiting, Nausea Only   Hctz [hydrochlorothiazide] Other (See Comments)   Currently taking without problem; question if this has to do with hypokalemia   Hydralazine Other (See Comments)   Also tolerated, but had orthostatic changes on standing medication   Lisinopril Other (See Comments), Nausea Only   unknown   Nebivolol Nausea Only   Olmesartan Nausea Only, Other (See  Comments)   Unclear of the intolerance   Rythmol [propafenone] Other (See Comments)   unknown   Statins Other (See Comments), Nausea Only   unknown   Carvedilol Other (See Comments), Nausea And Vomiting   fatigue   Prednisone Nausea And Vomiting, Other (See Comments)   Jittery        Medication List    STOP taking these medications   carvedilol 12.5 MG tablet Commonly known as:  COREG   digoxin 0.125 MG tablet Commonly known as:  LANOXIN   diltiazem 180 MG 24 hr capsule Commonly known as:  CARDIZEM CD     TAKE these medications   aspirin 325 MG EC tablet Take 1 tablet (325 mg total) by mouth daily with breakfast. What changed:    medication strength  how much to take  when to take this   clopidogrel 75 MG tablet Commonly known as:  PLAVIX Take 1 tablet (75 mg total) by mouth daily with breakfast.   furosemide 40 MG tablet Commonly known as:  LASIX TAKE ONE TABLET BY MOUTH TWICE DAILY   HYDROcodone-acetaminophen 5-325 MG tablet Commonly known as:  NORCO/VICODIN Take 1 tablet by mouth every 6 (six) hours as needed for moderate pain (pain score 4-6).   NON FORMULARY Apply 1-2 g topically 4 (four) times daily as needed for pain. C-COMBINATION PAIN CR  (baclofen 2%-doxepin 5%-gabapentin 6%-pentoxifylline 3%-topiramate 2%)   potassium chloride SA 20 MEQ tablet Commonly known as:  K-DUR,KLOR-CON Take 2 tablets (40 mEq total) by mouth 2 (two) times daily.   rOPINIRole 1 MG tablet Commonly known as:  REQUIP Take 1 tablet by mouth at bedtime.       Contact information for follow-up providers    Lendon Colonel, NP Follow up.   Specialties:  Nurse Practitioner, Radiology, Cardiology Why:  You have a hospital follow-up visit scheduled on 07/17/2018 at 10:30am. Please arrive 15 minutes early for check in. Contact information: [place] [phone]            Contact information for after-discharge care    Destination    [facility] .   Service:  Skilled Nursing Contact information: [place] [phone]                  Signed: Marybelle Killings 07/11/2018, 4:02 PM

## 2018-07-11 NOTE — Discharge Instructions (Signed)
WBAT with therapy. Knee ROM, quad strengthening. See Dr. Lorin Mercy in two weeks.

## 2018-07-11 NOTE — Progress Notes (Signed)
Patient will DC [id number] Anticipated DC date: 07/11/2018 Family notified: [facility] Transport by: Corey Harold  Per MD patient ready for DC to [facility]. RN, patient, patient's family, and facility notified of DC. Discharge Summary sent to facility. RN given number for report [phone]. DC packet on chart. Ambulance transport requested for patient.  CSW signing off.  [place], [place]

## 2018-07-11 NOTE — Progress Notes (Signed)
Progress Note  Patient Name: Sara [name] Date of Encounter: 07/11/2018  Primary Cardiologist: Glenetta Hew, MD   Subjective   Pt feeling a little better than yesterday   Nausea gone   Inpatient Medications    Scheduled Meds: . aspirin EC  325 mg Oral Q breakfast  . clopidogrel  75 mg Oral Q breakfast  . docusate sodium  100 mg Oral BID  . rOPINIRole  1 mg Oral QHS   Continuous Infusions:  PRN Meds: acetaminophen, HYDROcodone-acetaminophen, menthol-cetylpyridinium **OR** phenol, methocarbamol, metoCLOPramide **OR** metoCLOPramide (REGLAN) injection, ondansetron **OR** ondansetron (ZOFRAN) IV, polyethylene glycol   Vital Signs    Vitals:   07/10/18 0329 07/10/18 1420 07/10/18 2149 07/11/18 0400  BP: 128/61 (!) 115/47 (!) 104/56 (!) 124/38  Pulse: (!) 54 (!) 54 66 87  Resp: 20 16 20 18   Temp: 98.3 F (36.8 C) 98.1 F (36.7 C) 98.2 F (36.8 C) 97.6 F (36.4 C)  TempSrc: Oral Oral Oral Oral  SpO2: 98% 95% 92% 99%  Weight:      Height:        Intake/Output Summary (Last 24 hours) at 07/11/2018 0919 Last data filed at 07/10/2018 1420 Gross per 24 hour  Intake 240 ml  Output 300 ml  Net -60 ml   Last 3 Weights 07/08/2018 07/08/2018 06/28/2018  Weight (lbs) 147 lb 14.9 oz 148 lb 148 lb  Weight (kg) 67.1 kg 67.132 kg 67.132 kg      Telemetry    AFib  50s to 70 - Personally Reviewed  ECG      Physical Exam   GEN: Pt appears uncomfotable  Neck: JVP is normal  Cardiac: Irreg irreg , no murmurs, rubs, or gallops.  Respiratory: Clear to auscultation bilaterally. GI: Soft, nontender, non-distended  MS: No edema; No deformity. Neuro:  Nonfocal  Psych: Normal affect   Labs    Chemistry Recent Labs  Lab 07/09/18 0327 07/10/18 0225 07/11/18 0342  NA 142 137 136  K 2.9* 4.1 4.4  CL 115* 107 105  CO2 22 23 26   GLUCOSE 111* 152* 122*  BUN 11 11 13   CREATININE 0.62 0.77 0.82  CALCIUM 7.0* 8.3* 8.5*  GFRNONAA >60 >60 >60  GFRAA >60 >60 >60    ANIONGAP 5 7 5      Hematology Recent Labs  Lab 07/09/18 0327 07/10/18 0225 07/11/18 0342  WBC 6.3 8.5 8.0  RBC 3.38* 3.74* 3.54*  HGB 9.8* 10.9* 10.3*  HCT 29.7* 32.8* 31.1*  MCV 87.9 87.7 87.9  MCH 29.0 29.1 29.1  MCHC 33.0 33.2 33.1  RDW 14.9 14.9 15.1  PLT 142* 153 166    Cardiac Enzymes Recent Labs  Lab 07/08/18 1838  TROPONINI <0.03   No results for input(s): TROPIPOC in the last 168 hours.   BNPNo results for input(s): BNP, PROBNP in the last 168 hours.   DDimer No results for input(s): DDIMER in the last 168 hours.   Radiology    No results found.  Cardiac Studies     Patient Profile     85 y.o. female hx of chronic atrial fibrillation (rate controlled, not on anticoagulation)  Assessment & Plan    1  Atrial fibrillation  Pt remains off of meds and rates are controlled   She is in bed however   BP is OK     Prior to admit she was on Coreg 12.5 bid, dig 0.125 and Dilt 180 daily I am reluctant to add anything that may make  her hypotensive or bradycardic   WIll need to make sure she has follow up soon in clinic soon to reevaluate  She hs appt with D [facility] at the end of Feb   Will get one with APP sooner  Note plans to go to [facility] for rehab    Will see about appt next week   Holter monitor would be good once she starts moving \ Pt has refused anticoag for afib   2  PAD  COnt  ASA and Plavix    For questions or updates, please contact [facility] Please consult [email or web address] for contact info under        Signed, Dorris Carnes, MD  07/11/2018, 9:19 AM

## 2018-07-11 NOTE — Progress Notes (Signed)
Physical Therapy Treatment Patient Details Name: Sara [name] MRN: [id number] DOB: 22-May-1926 Today's Date: 07/11/2018    History of Present Illness Pt is a 85 yo female who presents for L TKA in setting of not ambulating since 6/19 due to knee dysfunction. PMH: afib, HTN, HLD    PT Comments    Patient seen for mobility progression. Pt is able to ambulate 10 ft with mod A and reports less pain with mobility this session. Continue to progress as tolerated with anticipated d/c to SNF for further skilled PT services.     Follow Up Recommendations  SNF;Supervision for mobility/OOB     Equipment Recommendations  None recommended by PT    Recommendations for Other Services       Precautions / Restrictions Precautions Precautions: Fall;Knee Precaution Booklet Issued: Yes (comment) Precaution Comments: precautions/positioning reviewed with pt Required Braces or Orthoses: Knee Immobilizer - Left Knee Immobilizer - Left: On except when in CPM Restrictions Weight Bearing Restrictions: Yes LLE Weight Bearing: Weight bearing as tolerated    Mobility  Bed Mobility Overal bed mobility: Needs Assistance Bed Mobility: Supine to Sit     Supine to sit: Mod assist     General bed mobility comments: pt assisting much more this session; assist to bring L LE and hips to EOB and to elevate trunk into sitting; cues for sequencing and technique; use of rail   Transfers Overall transfer level: Needs assistance Equipment used: Rolling walker (2 wheeled) Transfers: Sit to/from Stand Sit to Stand: Mod assist;+2 physical assistance         General transfer comment: cues for safe hand placement and positioning; assist to power up into standing and for balance in standing; posterior bias; cues for weight shifting, L LE for increased BOS, and hip extension  Ambulation/Gait Ambulation/Gait assistance: Mod assist;+2 safety/equipment(chair follow) Gait Distance (Feet): 10 Feet Assistive  device: Rolling walker (2 wheeled) Gait Pattern/deviations: Step-to pattern;Decreased stance time - left;Decreased step length - right;Decreased weight shift to left;Trunk flexed;Antalgic     General Gait Details: multimodal cues for posture and vc for sequencing; assist for balance and guiding RW; heavy reliance on UE support   Stairs             Wheelchair Mobility    Modified Rankin (Stroke Patients Only)       Balance Overall balance assessment: Needs assistance Sitting-balance support: Single extremity supported;Feet supported Sitting balance-Leahy Scale: Poor     Standing balance support: Bilateral upper extremity supported Standing balance-Leahy Scale: Poor                              Cognition Arousal/Alertness: Awake/alert Behavior During Therapy: WFL for tasks assessed/performed Overall Cognitive Status: Within Functional Limits for tasks assessed                                 General Comments: needs encouragement      Exercises Total Joint Exercises Ankle Circles/Pumps: AROM;Both Quad Sets: AROM;Left Heel Slides: AAROM;Left    General Comments        Pertinent Vitals/Pain Pain Assessment: Faces Faces Pain Scale: Hurts little more Pain Location: L knee Pain Descriptors / Indicators: Guarding;Sore Pain Intervention(s): Limited activity within patient's tolerance;Monitored during session;Repositioned    Home Living  Prior Function            PT Goals (current goals can now be found in the care plan section) Acute Rehab PT Goals Patient Stated Goal: rehab then home Progress towards PT goals: Progressing toward goals    Frequency    Min 5X/week      PT Plan Current plan remains appropriate    Co-evaluation              AM-PAC PT "6 Clicks" Mobility   Outcome Measure  Help needed turning from your back to your side while in a flat bed without using bedrails?: A  Lot Help needed moving from lying on your back to sitting on the side of a flat bed without using bedrails?: A Lot Help needed moving to and from a bed to a chair (including a wheelchair)?: A Lot Help needed standing up from a chair using your arms (e.g., wheelchair or bedside chair)?: A Lot Help needed to walk in hospital room?: A Lot Help needed climbing 3-5 steps with a railing? : Total 6 Click Score: 11    End of Session Equipment Utilized During Treatment: Gait belt;Left knee immobilizer Activity Tolerance: Patient tolerated treatment well Patient left: in chair;with call bell/phone within reach;with chair alarm set Nurse Communication: Mobility status PT Visit Diagnosis: Muscle weakness (generalized) (M62.81);Difficulty in walking, not elsewhere classified (R26.2);Pain Pain - Right/Left: Left Pain - part of body: Knee     Time: 4081-4481 PT Time Calculation (min) (ACUTE ONLY): 28 min  Charges:  $Gait Training: 8-22 mins $Therapeutic Exercise: 8-22 mins                     Earney Navy, PTA Acute Rehabilitation Services Pager: [phone] Office: [phone]     Darliss Cheney 07/11/2018, 4:52 PM

## 2018-07-11 NOTE — Plan of Care (Signed)
  Problem: Education: Goal: Knowledge of General Education information will improve Description Including pain rating scale, medication(s)/side effects and non-pharmacologic comfort measures Outcome: Progressing Note:  POC reviewed with pt. and pain management.   

## 2018-07-11 NOTE — Clinical Social Work Placement (Signed)
   CLINICAL SOCIAL WORK PLACEMENT  NOTE  Date:  07/11/2018  Patient Details  Name: Sara [name] MRN: [id number] Date of Birth: 08-10-1925  Clinical Social Work is seeking post-discharge placement for this patient at the [place] level of care (*CSW will initial, date and re-position this form in  chart as items are completed):  Yes   Patient/family provided with [place] Work Department's list of facilities offering this level of care within the geographic area requested by the patient (or if unable, by the patient's family).  Yes   Patient/family informed of their freedom to choose among providers that offer the needed level of care, that participate in Medicare, Medicaid or managed care program needed by the patient, have an available bed and are willing to accept the patient.  Yes   Patient/family informed of [place]'s ownership interest in [facility] and [facility], as well as of the fact that they are under no obligation to receive care at these facilities.  PASRR submitted to EDS on       PASRR number received on 07/10/18     Existing PASRR number confirmed on       FL2 transmitted to all facilities in geographic area requested by pt/family on 07/10/18     FL2 transmitted to all facilities within larger geographic area on       Patient informed that his/her managed care company has contracts with or will negotiate with certain facilities, including the following:        Yes   Patient/family informed of bed offers received.  Patient chooses bed at [place]     Physician recommends and patient chooses bed at      Patient to be transferred to [facility] on 07/11/18.  Patient to be transferred to facility by PTAR     Patient family notified on 07/11/18 of transfer.  Name of family member notified:  Patrick Jupiter (son)     PHYSICIAN Please sign FL2     Additional Comment:     _______________________________________________ Alberteen Sam, LCSW 07/11/2018, 4:13 PM

## 2018-07-11 NOTE — Progress Notes (Signed)
Nurse called [facility] at 2150 to find out when they will be here to pick patient up to SNF, I was told twice that patient is not on their schedule by the staff member I talked to. She told me she was going to check their day shift schedule and asked me to call back in 10 minutes. Will let the attending doctor know,and will continue to monitor patient.

## 2018-07-11 NOTE — Progress Notes (Signed)
1835 Waiting for [facility] to transport pt to [facility]. Report was given to [employer] at [facility].

## 2018-07-11 NOTE — Progress Notes (Signed)
   Subjective: 3 Days Post-Op Procedure(s) (LRB): LEFT TOTAL KNEE ARTHROPLASTY CEMENTED (Left) Patient reports pain as mild and moderate.    Objective: Vital signs in last 24 hours: Temp:  [97.6 F (36.4 C)-98.2 F (36.8 C)] 97.6 F (36.4 C) (01/23 0400) Pulse Rate:  [54-87] 87 (01/23 0400) Resp:  [16-20] 18 (01/23 0400) BP: (104-124)/(38-56) 124/38 (01/23 0400) SpO2:  [92 %-99 %] 99 % (01/23 0400)  Intake/Output from previous day: 01/22 0701 - 01/23 0700 In: 480 [P.O.:480] Out: 300 [Urine:300] Intake/Output this shift: No intake/output data recorded.  Recent Labs    07/08/18 1838 07/09/18 0327 07/10/18 0225 07/11/18 0342  HGB 12.6 9.8* 10.9* 10.3*   Recent Labs    07/10/18 0225 07/11/18 0342  WBC 8.5 8.0  RBC 3.74* 3.54*  HCT 32.8* 31.1*  PLT 153 166   Recent Labs    07/10/18 0225 07/11/18 0342  NA 137 136  K 4.1 4.4  CL 107 105  CO2 23 26  BUN 11 13  CREATININE 0.77 0.82  GLUCOSE 152* 122*  CALCIUM 8.3* 8.5*   No results for input(s): LABPT, INR in the last 72 hours.  Neurologically intact No results found.  Assessment/Plan: 3 Days Post-Op Procedure(s) (LRB): LEFT TOTAL KNEE ARTHROPLASTY CEMENTED (Left) Up with therapy, SNF when bed available.  Rx on chart norco and ASA for DVT proph.  Sara [name] 07/11/2018, 8:11 AM

## 2018-07-12 ENCOUNTER — Telehealth (INDEPENDENT_AMBULATORY_CARE_PROVIDER_SITE_OTHER): Payer: Self-pay

## 2018-07-12 DIAGNOSIS — D649 Anemia, unspecified: Secondary | ICD-10-CM | POA: Diagnosis not present

## 2018-07-12 DIAGNOSIS — R531 Weakness: Secondary | ICD-10-CM | POA: Diagnosis not present

## 2018-07-12 DIAGNOSIS — R262 Difficulty in walking, not elsewhere classified: Secondary | ICD-10-CM | POA: Diagnosis not present

## 2018-07-12 DIAGNOSIS — I10 Essential (primary) hypertension: Secondary | ICD-10-CM | POA: Diagnosis not present

## 2018-07-12 DIAGNOSIS — R05 Cough: Secondary | ICD-10-CM | POA: Diagnosis not present

## 2018-07-12 DIAGNOSIS — L988 Other specified disorders of the skin and subcutaneous tissue: Secondary | ICD-10-CM | POA: Diagnosis not present

## 2018-07-12 DIAGNOSIS — Z96652 Presence of left artificial knee joint: Secondary | ICD-10-CM | POA: Diagnosis not present

## 2018-07-12 DIAGNOSIS — I7389 Other specified peripheral vascular diseases: Secondary | ICD-10-CM | POA: Diagnosis not present

## 2018-07-12 DIAGNOSIS — G8918 Other acute postprocedural pain: Secondary | ICD-10-CM | POA: Diagnosis not present

## 2018-07-12 DIAGNOSIS — Z471 Aftercare following joint replacement surgery: Secondary | ICD-10-CM | POA: Diagnosis not present

## 2018-07-12 DIAGNOSIS — R609 Edema, unspecified: Secondary | ICD-10-CM | POA: Diagnosis not present

## 2018-07-12 DIAGNOSIS — L03116 Cellulitis of left lower limb: Secondary | ICD-10-CM | POA: Diagnosis not present

## 2018-07-12 NOTE — Telephone Encounter (Signed)
Eriss from [facility] calling stating the D/C summary didn't state patient weight bearing status, does she need CPM, or is she supposed to be wearing knee immobilizer? Her cb# is [phone]  Ext 214 Her fax# is [phone]

## 2018-07-12 NOTE — Telephone Encounter (Signed)
Please advise 

## 2018-07-12 NOTE — Telephone Encounter (Signed)
I called. WBAT, knee immobilizer PRN. I left her message on voice mail

## 2018-07-15 ENCOUNTER — Other Ambulatory Visit: Payer: Self-pay | Admitting: *Deleted

## 2018-07-15 ENCOUNTER — Telehealth (INDEPENDENT_AMBULATORY_CARE_PROVIDER_SITE_OTHER): Payer: Self-pay | Admitting: Orthopaedic Surgery

## 2018-07-15 NOTE — Telephone Encounter (Signed)
Please advise 

## 2018-07-15 NOTE — Telephone Encounter (Signed)
Heather from [facility] called regarding pt her knee is swollen and red but no more than how it was when she first came in. The Dr at the center gave her keflex.  They are just calling to get the okay about starting CPM

## 2018-07-15 NOTE — Patient Outreach (Signed)
[facility] [facility]) Care Management  07/15/2018  Sara [name] 12-22-1925 [id number]   Transition of Care Referral   Referral Date:  07/15/2018 Referral Source:  [facility] inpatient referral  Date of Admission: 07/08/2018 Diagnosis: s/p left total knee arthroplasty Date of Discharge:Facility: [facility] on 07/11/2018  Insurance: [place] attempt # 1 No answer. [facility] RN CM left HIPAA compliant voicemail message along with CM's contact info.   Plan: [facility] RN CM sent an unsuccessful outreach letter and scheduled this patient for another call attempt within 4 business days   Sara [name] L. Lavina Hamman, RN, BSN, [place] Management Care Coordinator Direct Number [phone] Mobile number [phone]  Main [facility] number [phone] Fax number [phone]

## 2018-07-16 ENCOUNTER — Ambulatory Visit: Payer: Self-pay | Admitting: *Deleted

## 2018-07-16 DIAGNOSIS — L988 Other specified disorders of the skin and subcutaneous tissue: Secondary | ICD-10-CM | POA: Diagnosis not present

## 2018-07-16 NOTE — Telephone Encounter (Signed)
I left voicemail for Nira Conn advising.

## 2018-07-16 NOTE — Telephone Encounter (Signed)
You call.  Okay for CPM.  Patient walked in 4 to 5 months so really the CPM is not the key problem its being up standing and taking some steps with therapy.  We will check her knee when she comes back in.

## 2018-07-17 ENCOUNTER — Ambulatory Visit: Payer: Self-pay | Admitting: *Deleted

## 2018-07-17 ENCOUNTER — Ambulatory Visit: Payer: Medicare HMO | Admitting: Adult Health

## 2018-07-18 ENCOUNTER — Ambulatory Visit: Payer: Self-pay | Admitting: *Deleted

## 2018-07-19 ENCOUNTER — Other Ambulatory Visit: Payer: Self-pay | Admitting: *Deleted

## 2018-07-19 NOTE — Patient Outreach (Signed)
[facility]) Care Management  07/19/2018  Tyger Oka 1925-10-03 [id number]   Transition of Care Referral  Referral Date:  07/15/2018 Referral Source:  [facility] inpatient referral  Date of Admission: 07/08/2018 Diagnosis: s/p left total knee arthroplasty Date of Discharge:Facility: [facility] on 07/11/2018  Insurance: [place] attempt # 2 No answer. [facility] RN CM left HIPAA compliant voicemail message along with CM's contact info.   Plan: [facility] RN CM scheduled this patient for another call attempt within 4 business days   Williom Cedar L. Lavina Hamman, RN, BSN, [place] Management Care Coordinator Direct Number [phone] Mobile number [phone]  Main [facility] number [phone] Fax number [phone]

## 2018-07-22 ENCOUNTER — Ambulatory Visit: Payer: Self-pay | Admitting: *Deleted

## 2018-07-23 ENCOUNTER — Encounter (INDEPENDENT_AMBULATORY_CARE_PROVIDER_SITE_OTHER): Payer: Self-pay | Admitting: Orthopaedic Surgery

## 2018-07-23 ENCOUNTER — Ambulatory Visit (INDEPENDENT_AMBULATORY_CARE_PROVIDER_SITE_OTHER): Payer: Medicare HMO | Admitting: Orthopaedic Surgery

## 2018-07-23 ENCOUNTER — Other Ambulatory Visit: Payer: Self-pay | Admitting: *Deleted

## 2018-07-23 VITALS — BP 156/85 | HR 123 | Temp 97.0°F | Ht 60.0 in | Wt 147.0 lb

## 2018-07-23 DIAGNOSIS — Z96652 Presence of left artificial knee joint: Secondary | ICD-10-CM

## 2018-07-23 DIAGNOSIS — L988 Other specified disorders of the skin and subcutaneous tissue: Secondary | ICD-10-CM | POA: Diagnosis not present

## 2018-07-23 DIAGNOSIS — Z4802 Encounter for removal of sutures: Secondary | ICD-10-CM

## 2018-07-23 DIAGNOSIS — Z792 Long term (current) use of antibiotics: Secondary | ICD-10-CM

## 2018-07-23 NOTE — Patient Outreach (Signed)
[facility] [facility]) Care Management  07/23/2018  Sara [name] [birth year] [id number]   Transition of Care Referral  Referral Date:07/15/2018 Referral Source:[employer] inpatient referral  Date of Admission:07/08/2018 Diagnosis:s/pleft total knee arthroplasty Date of Discharge:Facility:[facility] on 07/11/2018 [place] attempt # 3 No answer. [facility] RN CM left HIPAA compliant voicemail message along with CM's contact info.   Plan: [facility] RN CM scheduled this patient for case closure within 4 business days   Jerod Mcquain L. Lavina Hamman, RN, BSN, [place] Management Care Coordinator Direct Number [phone] Mobile number [phone]  Main [facility] number [phone] Fax number [phone]

## 2018-07-23 NOTE — Progress Notes (Addendum)
Post-Op Visit Note   Patient: Sara [name]           Date of Birth: 1926-03-23           MRN: [id number] Visit Date: 07/23/2018 PCP: Townsend Roger, MD   Assessment & Plan: Post left total knee arthroplasty patient is doing therapy.  She has an area low on her back at the L4-5 level transverse exactly corresponding to the elastic pants that she has on today that 3 to 4 cm long separated 4 to 5 mm.  She is currently on Keflex.  She had a Doppler test which was negative for knee.  Staples are harvested.  Her incision looks good no erythema.  Mild swelling in the knee as expected postop. Patient is at 90 degrees flexion.  She walked 67 feet and then repeated it following day she could only walk less than 50 feet since she thinks she did a little too much.  Recheck 4 weeks.  She is using Tylenol for pain. Chief Complaint:  Chief Complaint  Patient presents with  . Left Knee - Routine Post Op    07/08/2018 Left TKA Cemented   Visit Diagnoses:  1. Status post total left knee replacement     Plan: X-rays in the hospital 07/08/2018 look good.  Continue therapy when she gets out of [facility] she will need home physical therapy and then likely some outpatient therapy and plan to recheck her in 5 weeks.  Follow-Up Instructions: No follow-ups on file.   Orders:  No orders of the defined types were placed in this encounter.  No orders of the defined types were placed in this encounter.   Imaging: No results found.  PMFS History: Patient Active Problem List   Diagnosis Date Noted  . Arthritis of left knee 07/08/2018  . Left hand pain 04/23/2018  . Claudication in peripheral vascular disease ([place]) 03/18/2018  . Peripheral arterial disease ([place]) 02/27/2018  . Preop cardiovascular exam 01/19/2018  . Unilateral primary osteoarthritis, left knee 01/08/2018  . Hx of total knee arthroplasty, right 11/16/2017  . Hypokalemia   . Chronic diastolic CHF (congestive heart failure), NYHA class 1  ([place]) 05/16/2016  . Gait abnormality 04/11/2016  . History of breast cancer 04/11/2016  . Persistent atrial fibrillation;; CHA2DS2-VASc Score and unadjusted Ischemic Stroke Rate (% per year) is equal to 4.8 % stroke rate/year from a score of 4 03/29/2016  . Paroxysmal atrial fibrillation ([place]) 03/29/2016  . Atypical syncope 03/26/2016  . 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 Obesity (BMI 30-39.9) 03/18/2013  . Essential hypertension 03/18/2013   Past Medical History:  Diagnosis Date  . Ankle edema    Mild, which is intermittent, usually mildly dependent, left slightly greater than the right.  . Anxiety   . Arthritis    In her knees  . Balance problem    Due to weakened knee  . Chronic diastolic heart failure (HCC)    Class I-II -- exacerbated by Afib RVR  . Corneal edema    Severe corneal edema in right eye with multiple folds. Developed after cataract surgery 05/03/09  . Dysrhythmia    a-fib    since aghe 85 yrs old  . GERD (gastroesophageal reflux disease)   . Hematuria    Resolved after coumadin was stopped  . Hiatal hernia    With GERD  . History of breast cancer   . HOH (hard of hearing)   . Hyperglycemia    Random, without any history  of diabetes  . Hyperlipidemia   . Hypertension    Difficult to control. Renal Doppler 06/02/10 showed less that 60% bilateral renal artery narrowing.  . Hypokalemia   . PAD (peripheral artery disease) ([place])   . Permanent atrial fibrillation    Rate control.  ASA.  NOT on DOAC or warfarin due to concern for falls &bleeding.  Marland Kitchen Posthemorrhagic anemia   . Pseudophakia   . Syncope 02/22/11   During OV on 02/22/11 her INR was 7.3 and was given 2.5 mg of vitamin K. Later that day she became diaphoretic & fainted.  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 Cancer Mother        Mouth  . Hypertension Mother   . Coronary artery disease Brother   . Hypertension Brother   . Diabetes  type II Brother   . Heart attack Sister   . Hypertension Sister     Past Surgical History:  Procedure Laterality Date  . ABDOMINAL HYSTERECTOMY    . BREAST BIOPSY     3 biopsies  . CARDIOVERSION N/A 03/31/2016   Procedure: CARDIOVERSION;  Surgeon: Jerline Pain, MD;  Location: [facility] ENDOSCOPY;  Service: Cardiovascular: Successful cardioversion from A. fib to sinus rhythm  . CATARACT EXTRACTION  05/03/09  . CHOLECYSTECTOMY    . EYE SURGERY     cataract  . HEMORRHOID SURGERY    . LOWER EXTREMITY ANGIOGRAPHY Right 03/18/2018   Procedure: LOWER EXTREMITY ANGIOGRAPHY;  Surgeon: Lorretta Harp, MD;  Location: [place] CV LAB;  Service: Cardiovascular;  Laterality: Right;  . PERIPHERAL VASCULAR INTERVENTION  03/18/2018   Procedure: PERIPHERAL VASCULAR INTERVENTION;  Surgeon: Lorretta Harp, MD;  Location: [place] CV LAB;  Service: Cardiovascular;;  left SFA  . Remote hysterectomy    . TEE WITHOUT CARDIOVERSION N/A 03/31/2016   Procedure: TRANSESOPHAGEAL ECHOCARDIOGRAM (TEE);  Surgeon: Jerline Pain, MD;  Location: [facility];  Service: Cardiovascular:  EF 55-60%. No vegetation or thrombus okay for DC CV  . TONSILLECTOMY    . TOTAL KNEE ARTHROPLASTY Right 08/26/10   By Dr. Elta Guadeloupe C. Zeki Bedrosian. Cemented, computer assist  . TOTAL KNEE ARTHROPLASTY Left 07/08/2018   Procedure: LEFT TOTAL KNEE ARTHROPLASTY CEMENTED;  Surgeon: Marybelle Killings, MD;  Location: [facility];  Service: Orthopedics;  Laterality: Left;  . TRANSTHORACIC ECHOCARDIOGRAM  03/2016    Mild LVH. EF 65-70%. High LVEDP no RWMA. Severe LA dilation. Peak PAP ~ 37 mmHg   Social History   Occupational History    Employer: RETIRED   Tobacco Use  . Smoking status: Never Smoker  . Smokeless tobacco: Never Used  Substance and Sexual Activity  . Alcohol use: No  . Drug use: No  . Sexual activity: Not on file

## 2018-07-29 ENCOUNTER — Other Ambulatory Visit: Payer: Self-pay | Admitting: *Deleted

## 2018-07-29 ENCOUNTER — Ambulatory Visit: Payer: 59 | Admitting: Adult Health

## 2018-07-29 NOTE — Patient Outreach (Signed)
[place] [facility]) Care Management  07/29/2018  Kinzlie Harney Jul 26, 1925 [id number]   Case closure   Call attempts made on 07/15/2018, 07/19/2018 and 07/23/2018 for transition of care coordination Unsuccessful outreach letter sent on 07/15/2018 without a response   Plan [facility] RN CM will close case after no response from patient within 10 business days. Unable to reach  [place]. Lavina Hamman, RN, BSN, [place] Coordinator Office number [phone] Mobile number [phone]  Main [facility] number [phone] Fax number [phone]

## 2018-07-30 DIAGNOSIS — L988 Other specified disorders of the skin and subcutaneous tissue: Secondary | ICD-10-CM | POA: Diagnosis not present

## 2018-08-03 DIAGNOSIS — S31000D Unspecified open wound of lower back and pelvis without penetration into retroperitoneum, subsequent encounter: Secondary | ICD-10-CM | POA: Diagnosis not present

## 2018-08-03 DIAGNOSIS — I5032 Chronic diastolic (congestive) heart failure: Secondary | ICD-10-CM | POA: Diagnosis not present

## 2018-08-03 DIAGNOSIS — I110 Hypertensive heart disease with heart failure: Secondary | ICD-10-CM | POA: Diagnosis not present

## 2018-08-03 DIAGNOSIS — I2510 Atherosclerotic heart disease of native coronary artery without angina pectoris: Secondary | ICD-10-CM | POA: Diagnosis not present

## 2018-08-03 DIAGNOSIS — Z471 Aftercare following joint replacement surgery: Secondary | ICD-10-CM | POA: Diagnosis not present

## 2018-08-03 DIAGNOSIS — Z96652 Presence of left artificial knee joint: Secondary | ICD-10-CM | POA: Diagnosis not present

## 2018-08-03 DIAGNOSIS — D649 Anemia, unspecified: Secondary | ICD-10-CM | POA: Diagnosis not present

## 2018-08-03 DIAGNOSIS — I4891 Unspecified atrial fibrillation: Secondary | ICD-10-CM | POA: Diagnosis not present

## 2018-08-03 DIAGNOSIS — I739 Peripheral vascular disease, unspecified: Secondary | ICD-10-CM | POA: Diagnosis not present

## 2018-08-04 DIAGNOSIS — Z471 Aftercare following joint replacement surgery: Secondary | ICD-10-CM | POA: Diagnosis not present

## 2018-08-04 DIAGNOSIS — Z96652 Presence of left artificial knee joint: Secondary | ICD-10-CM | POA: Diagnosis not present

## 2018-08-04 DIAGNOSIS — I739 Peripheral vascular disease, unspecified: Secondary | ICD-10-CM | POA: Diagnosis not present

## 2018-08-04 DIAGNOSIS — D649 Anemia, unspecified: Secondary | ICD-10-CM | POA: Diagnosis not present

## 2018-08-04 DIAGNOSIS — I5032 Chronic diastolic (congestive) heart failure: Secondary | ICD-10-CM | POA: Diagnosis not present

## 2018-08-04 DIAGNOSIS — S31000D Unspecified open wound of lower back and pelvis without penetration into retroperitoneum, subsequent encounter: Secondary | ICD-10-CM | POA: Diagnosis not present

## 2018-08-04 DIAGNOSIS — I2510 Atherosclerotic heart disease of native coronary artery without angina pectoris: Secondary | ICD-10-CM | POA: Diagnosis not present

## 2018-08-04 DIAGNOSIS — I110 Hypertensive heart disease with heart failure: Secondary | ICD-10-CM | POA: Diagnosis not present

## 2018-08-04 DIAGNOSIS — I4891 Unspecified atrial fibrillation: Secondary | ICD-10-CM | POA: Diagnosis not present

## 2018-08-05 ENCOUNTER — Telehealth (INDEPENDENT_AMBULATORY_CARE_PROVIDER_SITE_OTHER): Payer: Self-pay | Admitting: Orthopaedic Surgery

## 2018-08-05 NOTE — Telephone Encounter (Signed)
Gretchen-(PT) with [facility] called needing verbal orders for HHPT 1 wk 1, 2 Wk 3 and 1 wk 4. The number to contact Elzie Rings is [phone]

## 2018-08-05 NOTE — Telephone Encounter (Signed)
Ok. Thank you.

## 2018-08-05 NOTE — Telephone Encounter (Signed)
Ok thanks 

## 2018-08-05 NOTE — Telephone Encounter (Signed)
Ok for orders? 

## 2018-08-05 NOTE — Telephone Encounter (Signed)
Rhonda-nurse with [facility] called left voicemail message needing verbal orders for nursing 1 time a week for 2 wks and every other wk for 4 wks. The number to contact Suanne Marker is [phone]

## 2018-08-05 NOTE — Telephone Encounter (Signed)
I left voicemail for Suanne Marker advising.

## 2018-08-06 ENCOUNTER — Telehealth (INDEPENDENT_AMBULATORY_CARE_PROVIDER_SITE_OTHER): Payer: Self-pay | Admitting: Orthopaedic Surgery

## 2018-08-06 ENCOUNTER — Other Ambulatory Visit: Payer: Self-pay

## 2018-08-06 NOTE — Telephone Encounter (Signed)
noted 

## 2018-08-06 NOTE — Telephone Encounter (Signed)
Verbal orders given to [place] (different message). I called Renee and advised.

## 2018-08-06 NOTE — Telephone Encounter (Signed)
[facility]  [id number]  Renee   Verbal orders  1 time a week for one week ( for this week)  2 times a week for three weeks   1 time a week for four weeks

## 2018-08-06 NOTE — Patient Outreach (Signed)
[place] [facility]) Care Management  08/06/2018  Sara [name] 07/17/1925 [id number]   Transition of care  Referral date: 08/06/18 Referral source: discharged from [facility] home on 08/02/18 Insurance: [employer] Attempt #1  Telephone call to patient regarding transition of care referral.  Unable to reach patient. HIPAA compliant voice message left with call back phone number   PLAN: RNCM will attempt 2nd telephone call to patient within 4 business days.  RNCm will send patient outreach letter.   Quinn Plowman RN,BSN,CCM [facility] Telephonic  [phone]

## 2018-08-06 NOTE — Telephone Encounter (Signed)
I called Elzie Rings and advised.

## 2018-08-06 NOTE — Telephone Encounter (Signed)
Ok she already had a neg doppler. FYI

## 2018-08-06 NOTE — Telephone Encounter (Signed)
Patient called to ask about swelling in her ankle on the same leg that she had surgery on.07/08/2018 was surgery date. Patient being doing therapy at home and has a concern of the swelling and therapist advised her to call to see if this was associated with the surgery.  [phone]

## 2018-08-06 NOTE — Telephone Encounter (Signed)
FYI I called patient. She is having increased pain around her ankle, especially when she tries to walk.  She also has some swelling. Her knee continues to hurt as well. Patient is taking Aspirin 325mg  and Plavix daily.  I worked her in to the schedule for tomorrow afternoon.

## 2018-08-07 ENCOUNTER — Encounter (INDEPENDENT_AMBULATORY_CARE_PROVIDER_SITE_OTHER): Payer: Self-pay | Admitting: Orthopaedic Surgery

## 2018-08-07 ENCOUNTER — Ambulatory Visit (INDEPENDENT_AMBULATORY_CARE_PROVIDER_SITE_OTHER): Payer: Medicare HMO | Admitting: Orthopaedic Surgery

## 2018-08-07 ENCOUNTER — Ambulatory Visit (INDEPENDENT_AMBULATORY_CARE_PROVIDER_SITE_OTHER): Payer: Medicare HMO

## 2018-08-07 VITALS — BP 138/94 | HR 101 | Ht 60.0 in | Wt 147.0 lb

## 2018-08-07 DIAGNOSIS — M25572 Pain in left ankle and joints of left foot: Secondary | ICD-10-CM | POA: Diagnosis not present

## 2018-08-07 DIAGNOSIS — S31000D Unspecified open wound of lower back and pelvis without penetration into retroperitoneum, subsequent encounter: Secondary | ICD-10-CM | POA: Diagnosis not present

## 2018-08-07 DIAGNOSIS — I739 Peripheral vascular disease, unspecified: Secondary | ICD-10-CM | POA: Diagnosis not present

## 2018-08-07 DIAGNOSIS — Z471 Aftercare following joint replacement surgery: Secondary | ICD-10-CM | POA: Diagnosis not present

## 2018-08-07 DIAGNOSIS — D649 Anemia, unspecified: Secondary | ICD-10-CM | POA: Diagnosis not present

## 2018-08-07 DIAGNOSIS — I2510 Atherosclerotic heart disease of native coronary artery without angina pectoris: Secondary | ICD-10-CM | POA: Diagnosis not present

## 2018-08-07 DIAGNOSIS — Z96652 Presence of left artificial knee joint: Secondary | ICD-10-CM

## 2018-08-07 DIAGNOSIS — I4891 Unspecified atrial fibrillation: Secondary | ICD-10-CM | POA: Diagnosis not present

## 2018-08-07 DIAGNOSIS — M79605 Pain in left leg: Secondary | ICD-10-CM | POA: Diagnosis not present

## 2018-08-07 DIAGNOSIS — I5032 Chronic diastolic (congestive) heart failure: Secondary | ICD-10-CM | POA: Diagnosis not present

## 2018-08-07 DIAGNOSIS — I110 Hypertensive heart disease with heart failure: Secondary | ICD-10-CM | POA: Diagnosis not present

## 2018-08-07 NOTE — Progress Notes (Addendum)
Post-Op Visit Note   Patient: Sara [name]           Date of Birth: 22-Dec-1925           MRN: [id number] Visit Date: 08/07/2018 PCP: Townsend Roger, MD   Assessment & Plan: Post left total knee arthroplasty.  Patient went to skilled facility after leaving the hospital was at [place] and then states she developed pneumonia and was treated for this.  She is now back home having considerable problems with swelling in her leg and ankle.  She also had some problems with numbness in her hands related to using her walker.  Incision looks good swelling is down in her knee she flexes the 90 and has about a 4 degree extension lag.  She needs venous Doppler rule out DVT she has teds at home she can use.  Chief Complaint:  Chief Complaint  Patient presents with  . Left Leg - Pain  . Left Ankle - Pain   Visit Diagnoses:  1. Pain in left ankle and joints of left foot   2. Pain in left leg     Plan: Continue therapy.  Venous Doppler rule out DVT due to her significant leg swelling.  Recheck 1 month.  Follow-Up Instructions: No follow-ups on file.   Orders:  Orders Placed This Encounter  Procedures  . XR Tibia/Fibula Left  . XR Ankle 2 Views Left   No orders of the defined types were placed in this encounter.   Imaging: No results found.  PMFS History: Patient Active Problem List   Diagnosis Date Noted  . Arthritis of left knee 07/08/2018  . Left hand pain 04/23/2018  . Claudication in peripheral vascular disease ([place]) 03/18/2018  . Peripheral arterial disease ([place]) 02/27/2018  . Preop cardiovascular exam 01/19/2018  . Unilateral primary osteoarthritis, left knee 01/08/2018  . Status post total left knee replacement 11/16/2017  . Hypokalemia   . Chronic diastolic CHF (congestive heart failure), NYHA class 1 ([place]) 05/16/2016  . Gait abnormality 04/11/2016  . History of breast cancer 04/11/2016  . Persistent atrial fibrillation;; CHA2DS2-VASc Score and unadjusted Ischemic  Stroke Rate (% per year) is equal to 4.8 % stroke rate/year from a score of 4 03/29/2016  . Paroxysmal atrial fibrillation ([place]) 03/29/2016  . Atypical syncope 03/26/2016  . 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 Obesity (BMI 30-39.9) 03/18/2013  . Essential hypertension 03/18/2013   Past Medical History:  Diagnosis Date  . Ankle edema    Mild, which is intermittent, usually mildly dependent, left slightly greater than the right.  . Anxiety   . Arthritis    In her knees  . Balance problem    Due to weakened knee  . Chronic diastolic heart failure (HCC)    Class I-II -- exacerbated by Afib RVR  . Corneal edema    Severe corneal edema in right eye with multiple folds. Developed after cataract surgery 05/03/09  . Dysrhythmia    a-fib    since aghe 85 yrs old  . GERD (gastroesophageal reflux disease)   . Hematuria    Resolved after coumadin was stopped  . Hiatal hernia    With GERD  . History of breast cancer   . HOH (hard of hearing)   . Hyperglycemia    Random, without any history of diabetes  . Hyperlipidemia   . Hypertension    Difficult to control. Renal Doppler 06/02/10 showed less that 60% bilateral renal artery narrowing.  . Hypokalemia   .  PAD (peripheral artery disease) ([place])   . Permanent atrial fibrillation    Rate control.  ASA.  NOT on DOAC or warfarin due to concern for falls &bleeding.  Marland Kitchen Posthemorrhagic anemia   . Pseudophakia   . Syncope 02/22/11   During OV on 02/22/11 her INR was 7.3 and was given 2.5 mg of vitamin K. Later that day she became diaphoretic & fainted.  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 Cancer Mother        Mouth  . Hypertension Mother   . Coronary artery disease Brother   . Hypertension Brother   . Diabetes type II Brother   . Heart attack Sister   . Hypertension Sister     Past Surgical History:  Procedure Laterality Date  . ABDOMINAL HYSTERECTOMY    . BREAST  BIOPSY     3 biopsies  . CARDIOVERSION N/A 03/31/2016   Procedure: CARDIOVERSION;  Surgeon: Jerline Pain, MD;  Location: [facility] ENDOSCOPY;  Service: Cardiovascular: Successful cardioversion from A. fib to sinus rhythm  . CATARACT EXTRACTION  05/03/09  . CHOLECYSTECTOMY    . EYE SURGERY     cataract  . HEMORRHOID SURGERY    . LOWER EXTREMITY ANGIOGRAPHY Right 03/18/2018   Procedure: LOWER EXTREMITY ANGIOGRAPHY;  Surgeon: Lorretta Harp, MD;  Location: [place] CV LAB;  Service: Cardiovascular;  Laterality: Right;  . PERIPHERAL VASCULAR INTERVENTION  03/18/2018   Procedure: PERIPHERAL VASCULAR INTERVENTION;  Surgeon: Lorretta Harp, MD;  Location: [place] CV LAB;  Service: Cardiovascular;;  left SFA  . Remote hysterectomy    . TEE WITHOUT CARDIOVERSION N/A 03/31/2016   Procedure: TRANSESOPHAGEAL ECHOCARDIOGRAM (TEE);  Surgeon: Jerline Pain, MD;  Location: [place];  Service: Cardiovascular:  EF 55-60%. No vegetation or thrombus okay for DC CV  . TONSILLECTOMY    . TOTAL KNEE ARTHROPLASTY Right 08/26/10   By Dr. Elta Guadeloupe C. Kenidee Cregan. Cemented, computer assist  . TOTAL KNEE ARTHROPLASTY Left 07/08/2018   Procedure: LEFT TOTAL KNEE ARTHROPLASTY CEMENTED;  Surgeon: Marybelle Killings, MD;  Location: [place];  Service: Orthopedics;  Laterality: Left;  . TRANSTHORACIC ECHOCARDIOGRAM  03/2016    Mild LVH. EF 65-70%. High LVEDP no RWMA. Severe LA dilation. Peak PAP ~ 37 mmHg   Social History   Occupational History    Employer: RETIRED   Tobacco Use  . Smoking status: Never Smoker  . Smokeless tobacco: Never Used  Substance and Sexual Activity  . Alcohol use: No  . Drug use: No  . Sexual activity: Not on file

## 2018-08-08 ENCOUNTER — Ambulatory Visit (HOSPITAL_COMMUNITY)
Admission: RE | Admit: 2018-08-08 | Discharge: 2018-08-08 | Disposition: A | Discharge status: Home / Self Care | Payer: Medicare HMO | Source: Ambulatory Visit | Attending: Orthopaedic Surgery | Admitting: Orthopaedic Surgery

## 2018-08-08 DIAGNOSIS — D649 Anemia, unspecified: Secondary | ICD-10-CM | POA: Diagnosis not present

## 2018-08-08 DIAGNOSIS — I4891 Unspecified atrial fibrillation: Secondary | ICD-10-CM | POA: Diagnosis not present

## 2018-08-08 DIAGNOSIS — I5032 Chronic diastolic (congestive) heart failure: Secondary | ICD-10-CM | POA: Diagnosis not present

## 2018-08-08 DIAGNOSIS — Z471 Aftercare following joint replacement surgery: Secondary | ICD-10-CM | POA: Diagnosis not present

## 2018-08-08 DIAGNOSIS — M79605 Pain in left leg: Secondary | ICD-10-CM | POA: Insufficient documentation

## 2018-08-08 DIAGNOSIS — I2510 Atherosclerotic heart disease of native coronary artery without angina pectoris: Secondary | ICD-10-CM | POA: Diagnosis not present

## 2018-08-08 DIAGNOSIS — I110 Hypertensive heart disease with heart failure: Secondary | ICD-10-CM | POA: Diagnosis not present

## 2018-08-08 DIAGNOSIS — Z96652 Presence of left artificial knee joint: Secondary | ICD-10-CM | POA: Diagnosis not present

## 2018-08-08 DIAGNOSIS — S31000D Unspecified open wound of lower back and pelvis without penetration into retroperitoneum, subsequent encounter: Secondary | ICD-10-CM | POA: Diagnosis not present

## 2018-08-08 DIAGNOSIS — I739 Peripheral vascular disease, unspecified: Secondary | ICD-10-CM | POA: Diagnosis not present

## 2018-08-08 NOTE — Progress Notes (Signed)
Left lower extremity venous duplex has been completed.   Preliminary results in CV Proc.   Abram Sander 08/08/2018 9:42 AM

## 2018-08-09 ENCOUNTER — Telehealth (INDEPENDENT_AMBULATORY_CARE_PROVIDER_SITE_OTHER): Payer: Self-pay | Admitting: Radiology

## 2018-08-09 ENCOUNTER — Other Ambulatory Visit: Payer: Self-pay

## 2018-08-09 NOTE — Patient Outreach (Addendum)
[facility]) Care Management  08/09/2018  Sara [name] 01-01-1926 [id number]  Transition of care  Referral date: 08/06/18 Referral source: discharged from [facility] on 08/02/18 Insurance: [facility]  Telephone call to patient regarding transition of care referral.  HIPAA verified with patient. Explained reason for call. Patient confirmed she discharged from [facility] on 08/02/18. She states she had a left total knee replacement. Patient states her left knee has been swelling quite a bit.  Patient denies any draining, increased redness, or running a fever. She reports she called Dr. Lorin Mercy office this morning and is waiting for a return call from the nurse. Patient states she currently has her left leg elevated and is applying ice. Patient states she saw Dr. Lorin Mercy on 08/07/18 and her knee seemed to be fine. Patient reports she had a doppler test done on yesterday to rule out a blood clot. She states the test was negative.  Patient reports she is wearing the compression hose as advised.    Patient states she is receiving home physical therapy from [facility]. She reports she has a follow up appointment scheduled with her cardiologist on 08/12/18 and a follow up with her primary MD on 08/15/18.   Patient denied any further needs or concerns at this times. She states she will follow up with her orthopedic doctor regarding the knee swelling.  RNCM advised patient to notify MD of any changes in condition prior to scheduled appointment. RNCM provided contact name and number: [phone] or main office number [phone] and 24 hour nurse advise line [phone] by mail as discussed .  RNCM verified patient aware of 911 services for urgent/ emergent needs.  PLAN: RNCM will close patient due to patient being assessed and having no further needs RNCM will send patient [place] care management brochure/ magnet RNCM will send closure letter to patients primary  MD  Quinn Plowman RN,BSN,CCM [facility] Telephonic  [phone]

## 2018-08-09 NOTE — Telephone Encounter (Signed)
Patient called this morning. She had her doppler yesterday which was negative for DVT. She is wearing her compression stocking, elevating her leg, and using ice. She states that the swelling continues to get worse and that she can hardly walk or sleep. She would like to know if there is anything else that she can do?  Pt CB [phone]

## 2018-08-09 NOTE — Telephone Encounter (Signed)
I called patient and advised. 

## 2018-08-09 NOTE — Telephone Encounter (Signed)
Elevate it more , keep it above her heart level and swelling will go down. ucall thanks

## 2018-08-12 ENCOUNTER — Ambulatory Visit: Payer: Medicare HMO | Admitting: Cardiology

## 2018-08-12 ENCOUNTER — Encounter: Payer: Self-pay | Admitting: Cardiology

## 2018-08-12 VITALS — BP 140/84 | HR 129 | Ht 60.0 in | Wt 146.5 lb

## 2018-08-12 DIAGNOSIS — I739 Peripheral vascular disease, unspecified: Secondary | ICD-10-CM

## 2018-08-12 DIAGNOSIS — I5032 Chronic diastolic (congestive) heart failure: Secondary | ICD-10-CM

## 2018-08-12 DIAGNOSIS — I4819 Other persistent atrial fibrillation: Secondary | ICD-10-CM | POA: Diagnosis not present

## 2018-08-12 DIAGNOSIS — I10 Essential (primary) hypertension: Secondary | ICD-10-CM | POA: Diagnosis not present

## 2018-08-12 MED ORDER — CARVEDILOL 12.5 MG PO TABS: 12.5000 mg | ORAL_TABLET | Freq: Two times a day (BID) | ORAL | 3 refills | Status: DC

## 2018-08-12 NOTE — Patient Instructions (Addendum)
Medication Instructions:  START TO TONIGHT AND TOMORROW MORNING NEXT TAKE WHOLE TABLET (12.5 MG )  , STARTING TOMORROW EVENING (07/24/18) TAKE 1/2 TABLET  (WILL EQUAL 6.25 MG) TWICE A DAY FOR 5 DAYS , IF  YOUR HEART INCREASE WHILE TAKING A 1/2 TABLET  YOU MAY TAKE A EXTRA 1/2 TABLET DOSE  THEN INCREASE TO  WHOLE FULL TABLET ( WILL EQUAL 12.5 MG)  TWICE A DAY.  If you need a refill on your cardiac medications before your next appointment, please call your pharmacy.   Lab work:  Not needed  If you have labs (blood work) drawn today and your tests are completely normal, you will receive your results only by: Marland Kitchen MyChart Message (if you have MyChart) OR . A paper copy in the mail If you have any lab test that is abnormal or we need to change your treatment, we will call you to review the results.  Testing/Procedures: Not needed  Follow-Up: At [facility], you and your health needs are our priority.  As part of our continuing mission to provide you with exceptional heart care, we have created designated Provider Care Teams.  These Care Teams include your primary Cardiologist (physician) and Advanced Practice Providers (APPs -  Physician Assistants and Nurse Practitioners) who all work together to provide you with the care you need, when you need it. . Your physician recommends that you schedule a follow-up appointment in: end of next week with Sara Domino DNP. Marland Kitchen Your physician recommends that you schedule a follow-up appointment in [place]. .   Any Other Special Instructions Will Be Listed Below (If Applicable).

## 2018-08-12 NOTE — Progress Notes (Signed)
PCP: Townsend Roger, MD  Orthopedic Surgeon: Dr. Rodell Perna ([place])  Clinic Note: Chief Complaint  Patient presents with  . Hospitalization Follow-up    First cardiology visit following knee surgery  . Atrial Fibrillation    RVR  . Hypertension    Not controlled    HPI: Sara [name] is a 85 y.o. female with a PMH of persistent rate controlled atrial fibrillation and PAD who returns for roughly 2-61-month follow-up--postop follow-up.Marland Kitchen    PAD: March 18, 2018 --> Dr. Gwenlyn Found performed a peripheral arterial angiography and PTCA-PCI of the SFA.  Plan was to 3 months of Plavix prior to being able to stop for surgery.  Geetika Laborde was last seen on May 22, 2018 --she had severe left-sided knee pain.  Unable to walk or stand.  Not taking additional torsemide.  Stable blood pressure diltiazem and carvedilol -successfully increase carvedilol, but did start low-dose digoxin.  Added diltiazem 180 mg daily for preop tachycardia.  Recent Hospitalizations:   Total knee arthroplasty July 08, 2018  Seen by Dr. Harrington Challenger in cardiology consultation while in the hospital --> was noted to be dizzy and bradycardic.  -->  Apparently she did not reduce to 0.125 mg dose of digoxin and was taking the full 0.25 mg --> digoxin level was high).  She was taken the diltiazem because of tachycardia prior to the hospitalization.  This was stopped as I suggested it could be.  Carvedilol was initially cut back as well as her IV fluids. ->  Dr. Harrington Challenger recommended starting metoprolol 25 mg twice daily which was not started --> unfortunately, she was discharged on NO CARDIAC MEDICATIONS other than aspirin and Plavix.  Unfortunately, plans are having her being seen closer than this visit never were completed and she is waited a whole month being off medications.  Studies Personally Reviewed - (if available, images/films reviewed: From Epic Chart or Care Everywhere)  Follow-up ABIs October 2019:  Marked improvement in left ABI up from 0.57-1.12.Marland Kitchen  Also normal TBI.  Stable right ABI.   Interval History: Charika returns here today just a little bit frustrated because she has heart heart rates in the 100-140s s since getting to the skilled nursing facility.  There is a documented orthopedics visit with a heart rate of 123 beats minute and blood pressure 156/85.  Despite this recording, and recordings clearly at the skilled nursing facility that had to have tachycardia with rates as high as 150s and hypertension, nothing was started.  Our office was not contacted and she has been tachycardic now for several weeks.  She now also notes worsening lower extremity edema mostly on the left leg.  She has been dyspneic.  Unfortunately her course was complicated by postop pneumonia (prior to her February 19 Ortho visit)-she went to [facility].  Even there, no medicines were restarted.  At this point, she seems to be doing relatively well with her rehab but is limited partially because the left leg below her operated knee is still quite swollen.  She is wearing a support stocking manner but it still seems to be quite stiff and tight.  Thankfully, she denies any significant orthopnea or PND symptoms, but she does feel short of breath with doing some anything, partly because she feels her heart rate going very fast.  When it gets going quite fast she may be noticeable discomfort in her chest but otherwise no real chest pain or pressure. No syncope or near syncope.  No  TIA or amaurosis fugax. No bleeding on aspirin and Plavix  Not walking enough to note claudication.  ROS: A comprehensive was performed. Review of Systems  Constitutional: Positive for malaise/fatigue (Has had no energy). Negative for chills and fever.  HENT: Negative for congestion and nosebleeds.   Respiratory: Positive for cough (Seems to be getting over her "pneumonia pretty well.) and shortness of breath (With exertion). Negative  for wheezing.   Gastrointestinal: Negative for blood in stool.  Genitourinary: Negative for hematuria.  Musculoskeletal: Positive for joint pain (Notably better knee pain, but still hurts and is swollen.). Negative for falls.  Neurological: Positive for dizziness (Probably more balance related.) and weakness (Both legs). Negative for focal weakness.  Psychiatric/Behavioral: Positive for memory loss. Negative for depression. The patient is not nervous/anxious.     I have reviewed and (if needed) personally updated the patient's problem list, medications, allergies, past medical and surgical history, social and family history.   Past Medical History:  Diagnosis Date  . Ankle edema    Mild, which is intermittent, usually mildly dependent, left slightly greater than the right.  . Anxiety   . Arthritis    In her knees  . Balance problem    Due to weakened knee  . Chronic diastolic heart failure (HCC)    Class I-II -- exacerbated by Afib RVR  . Corneal edema    Severe corneal edema in right eye with multiple folds. Developed after cataract surgery 05/03/09  . Dysrhythmia    a-fib    since aghe 85 yrs old  . GERD (gastroesophageal reflux disease)   . Hematuria    Resolved after coumadin was stopped  . Hiatal hernia    With GERD  . History of breast cancer   . HOH (hard of hearing)   . Hyperglycemia    Random, without any history of diabetes  . Hyperlipidemia   . Hypertension    Difficult to control. Renal Doppler 06/02/10 showed less that 60% bilateral renal artery narrowing.  . Hypokalemia   . PAD (peripheral artery disease) ([place])   . Permanent atrial fibrillation    Rate control.  ASA.  NOT on DOAC or warfarin due to concern for falls &bleeding.  Marland Kitchen Posthemorrhagic anemia   . Pseudophakia   . Syncope 02/22/11   During OV on 02/22/11 her INR was 7.3 and was given 2.5 mg of vitamin K. Later that day she became diaphoretic & fainted.  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 ABDOMINAL HYSTERECTOMY    . BREAST BIOPSY     3 biopsies  . CARDIOVERSION N/A 03/31/2016   Procedure: CARDIOVERSION;  Surgeon: Jerline Pain, MD;  Location: [facility] ENDOSCOPY;  Service: Cardiovascular: Successful cardioversion from A. fib to sinus rhythm  . CATARACT EXTRACTION  05/03/09  . CHOLECYSTECTOMY    . EYE SURGERY     cataract  . HEMORRHOID SURGERY    . LOWER EXTREMITY ANGIOGRAPHY Right 03/18/2018   Procedure: LOWER EXTREMITY ANGIOGRAPHY;  Surgeon: Lorretta Harp, MD;  Location: [place] CV LAB;  Service: Cardiovascular;  Laterality: Right;  . PERIPHERAL VASCULAR INTERVENTION  03/18/2018   Procedure: PERIPHERAL VASCULAR INTERVENTION;  Surgeon: Lorretta Harp, MD;  Location: [place] CV LAB;  Service: Cardiovascular;;  left SFA  . Remote hysterectomy    . TEE WITHOUT CARDIOVERSION N/A 03/31/2016   Procedure: TRANSESOPHAGEAL ECHOCARDIOGRAM (TEE);  Surgeon: Jerline Pain, MD;  Location: [facility] ENDOSCOPY;  Service: Cardiovascular:  EF 55-60%. No vegetation or thrombus okay for DC CV  . TONSILLECTOMY    . TOTAL KNEE ARTHROPLASTY Right 08/26/10   By Dr. Elta Guadeloupe C. Yates. Cemented, computer assist  . TOTAL KNEE ARTHROPLASTY Left 07/08/2018   Procedure: LEFT TOTAL KNEE ARTHROPLASTY CEMENTED;  Surgeon: Marybelle Killings, MD;  Location: [facility];  Service: Orthopedics;  Laterality: Left;  . TRANSTHORACIC ECHOCARDIOGRAM  03/2016    Mild LVH. EF 65-70%. High LVEDP no RWMA. Severe LA dilation. Peak PAP ~ 37 mmHg    Current Meds  Medication Sig  . aspirin EC 81 MG tablet Take 81 mg by mouth daily.  . cholecalciferol (VITAMIN D3) 25 MCG (1000 UT) tablet Take 2,000 Units by mouth daily.  . clopidogrel (PLAVIX) 75 MG tablet Take 1 tablet (75 mg total) by mouth daily with breakfast.  . furosemide (LASIX) 40 MG tablet TAKE ONE TABLET BY MOUTH TWICE DAILY  . potassium chloride SA (K-DUR,KLOR-CON) 20 MEQ tablet Take 2 tablets (40 mEq total) by mouth  2 (two) times daily.  Marland Kitchen rOPINIRole (REQUIP) 1 MG tablet Take 1 tablet by mouth at bedtime.     Allergies  Allergen Reactions  . Amiodarone Shortness Of Breath and Other (See Comments)    Stopped in Feb 2017 secondary to pulmonary complications  . Aliskiren Other (See Comments) and Nausea Only    unknown  . Amlodipine Besylate-Valsartan Other (See Comments) and Nausea Only    Unclear  . Bystolic [Nebivolol Hcl] Other (See Comments)    unknown  . Clonidine Derivatives Other (See Comments)    unknown  . Dronedarone Nausea And Vomiting and Nausea Only  . Hctz [Hydrochlorothiazide] Other (See Comments)    Currently taking without problem; question if this has to do with hypokalemia  . Hydralazine Other (See Comments)    Also tolerated, but had orthostatic changes on standing medication  . Lisinopril Other (See Comments) and Nausea Only    unknown  . Nebivolol Nausea Only  . Olmesartan Nausea Only and Other (See Comments)    Unclear of the intolerance  . Oxycodone     Patient states decreased heart rate, decreased blood pressure. List as allergy.  . Rythmol [Propafenone] Other (See Comments)    unknown  . Statins Other (See Comments) and Nausea Only    unknown  . Carvedilol Other (See Comments) and Nausea And Vomiting    fatigue  . Prednisone Nausea And Vomiting and Other (See Comments)    Jittery     Social History   Tobacco Use  . Smoking status: Never Smoker  . Smokeless tobacco: Never Used  Substance Use Topics  . Alcohol use: No  . Drug use: No   Social History   Social History Narrative   She is a widowed mother of one and grandmother of one.  She does not exercise.  She does not smoke and does not drink alcohol.    family history includes Cancer in her mother; Coronary artery disease in her brother; Diabetes type II in her brother; Heart attack in her sister; Hypertension in her brother, mother, and sister.  Wt Readings from Last 3 Encounters:  08/12/18 146 lb  8 oz (66.5 kg)  08/07/18 147 lb (66.7 kg)  07/23/18 147 lb (66.7 kg)    PHYSICAL EXAM BP 140/84   Pulse (!) 129   Ht 5' (1.524 m)   Wt 146 lb 8 oz (66.5 kg)   SpO2 99%  BMI 28.61 kg/m  Physical Exam  Constitutional: She is oriented to person, place, and time. She appears well-developed and well-nourished. No distress.  Well-groomed.  HENT:  Head: Normocephalic and atraumatic.  Neck: Normal range of motion. Neck supple. No hepatojugular reflux and no JVD present. Carotid bruit is not present.  Cardiovascular: S1 normal and S2 normal. An irregularly irregular rhythm present. Tachycardia present. PMI is not displaced. Exam reveals distant heart sounds and decreased pulses (Decreased but now palpable bilateral pedal). Exam reveals no gallop and no friction rub.  No murmur heard. Pulmonary/Chest: Effort normal. No respiratory distress. She has no wheezes. She has no rales. She exhibits tenderness (Along the sternal border).  Mild interstitial breath sounds  Abdominal: Soft. Bowel sounds are normal. She exhibits no distension. There is no abdominal tenderness. There is no rebound.  No HSM  Musculoskeletal: Normal range of motion.        General: Edema (Left leg below the knee has at least 2-3+ edema with TED hose in place.  The knee is also quite swollen which seems to be more postoperative.) present.  Neurological: She is alert and oriented to person, place, and time.  Psychiatric: She has a normal mood and affect. Her behavior is normal.  Both the patient and her sister seem to have poor memory and recall.  Somewhat difficult to get a full story.  Vitals reviewed.   Adult ECG Report  Rate: 129 ;  Rhythm: atrial fibrillation and Rapid ventricular rate.  RBBB.  Associated T wave inversions in the inferolateral leads, cannot exclude acute ischemia.;   Narrative Interpretation: Abnormal EKG, but similar to previous EKG --just faster rate   Other studies Reviewed: Additional studies/  records that were reviewed today include:  Recent Labs:   Lab Results  Component Value Date   CREATININE 0.82 07/11/2018   BUN 13 07/11/2018   NA 136 07/11/2018   K 4.4 07/11/2018   CL 105 07/11/2018   CO2 26 07/11/2018   No results found for: CHOL, HDL, LDLCALC, LDLDIRECT, TRIG, CHOLHDL  ASSESSMENT / PLAN: Problem List Items Addressed This Visit    Chronic diastolic CHF (congestive heart failure), NYHA class 1 ([place]) (Chronic)    It is hard to say that she is euvolemic because she has significant swelling in the left leg, but it is unilateral and this is the postop leg.  She really otherwise is not no PND or orthopnea.  She is on furosemide and we talked about maybe taking a couple extra doses of furosemide a couple days a week to see if we can reduce the swelling. Otherwise though I think the foot elevation and the support hose are probably the best option.      Relevant Medications   aspirin EC 81 MG tablet   carvedilol (COREG) 12.5 MG tablet   Other Relevant Orders   EKG 12-Lead   Essential hypertension (Chronic)    She has been hypertensive and tachycardic.  Blood pressure is still relatively high today.  Because she had some issues with hypotension in the hospital I will go immediately back to the 12.5 mg twice daily of carvedilol but will give her at least 2 loading doses before going back to 6.25 twice daily for 1 week.  We will need to gradually titrate her back to a stable regimen.      Relevant Medications   aspirin EC 81 MG tablet   carvedilol (COREG) 12.5 MG tablet   Peripheral arterial disease (HCC) (Chronic)  ABIs being followed by Dr. Quay Burow      Relevant Medications   aspirin EC 81 MG tablet   carvedilol (COREG) 12.5 MG tablet   Persistent atrial fibrillation;; CHA2DS2-VASc Score and unadjusted Ischemic Stroke Rate (% per year) is equal to 4.8 % stroke rate/year from a score of 4 - Primary (Chronic)    Unfortunately, all of her medications were  stopped in the hospital which makes sense because of the hypotension and bradycardia, but there was no plan for restarting medications in a patient who has had a history of very difficult to control blood pressure and A. fib heart rate.  Justifiably, digoxin was stopped because of elevated levels, but that was user error in taking the wrong dose. Because of her hypertension I have chosen to use carvedilol instead of metoprolol so I will therefore restart her carvedilol and gradually titrate up.  The plan will be for her to take 2 doses of her original 12.5 mg twice daily dosing I will then have her go back to 1/2 tablet twice daily for 5 days and then reevaluate symptoms of dizziness and heart rate.  If doing well, I want her to increase to a full tablet twice daily.  I do want her to follow-up shortly around the time of this up titration with 1 of my advanced practitioners to ensure a smooth transition.  In the past, she never tolerated going up to 18.75 mg of carvedilol.  Based on the fact that she was not accurately taking her digoxin, I think we probably would not continue digoxin.  In the past she did not do well with diltiazem long-term, but I think the bradycardia and hypotension in the hospital was probably more related to postop issues and digoxin.      Relevant Medications   aspirin EC 81 MG tablet   carvedilol (COREG) 12.5 MG tablet   Other Relevant Orders   EKG 12-Lead     I spent a total of 4minutes with the patient and chart review. >  50% of the time was spent in direct patient consultation.  We spent a long time talking about her hospital course.  We reviewed consultation notes and plan of what happened in the hospital.  We then discussed her discharge any activity after discharge including her hospitalization for pneumonia.  We then talked a long time about how to manage her A. fib going forward. In addition to the time directly with the patient, I spent close to 45 additional  minutes in chart review of clinic notes, hospital rounding notes and consult notes etc.  Current medicines are reviewed at length with the patient today.  (+/- concerns)  -why is my heart rate going faster The following changes have been made:  See below  Patient Instructions  Medication Instructions:  START TO TONIGHT AND TOMORROW MORNING NEXT TAKE WHOLE TABLET (12.5 MG )  , STARTING TOMORROW EVENING (07/24/18) TAKE 1/2 TABLET  (WILL EQUAL 6.25 MG) TWICE A DAY FOR 5 DAYS , IF  YOUR HEART INCREASE WHILE TAKING A 1/2 TABLET  YOU MAY TAKE A EXTRA 1/2 TABLET DOSE  THEN INCREASE TO  WHOLE FULL TABLET ( WILL EQUAL 12.5 MG)  TWICE A DAY.  If you need a refill on your cardiac medications before your next appointment, please call your pharmacy.   Lab work:  Not needed  If you have labs (blood work) drawn today and your tests are completely normal, you will receive your results only by: Marland Kitchen  MyChart Message (if you have MyChart) OR . A paper copy in the mail If you have any lab test that is abnormal or we need to change your treatment, we will call you to review the results.  Testing/Procedures: Not needed  Follow-Up: At [facility], you and your health needs are our priority.  As part of our continuing mission to provide you with exceptional heart care, we have created designated Provider Care Teams.  These Care Teams include your primary Cardiologist (physician) and Advanced Practice Providers (APPs -  Physician Assistants and Nurse Practitioners) who all work together to provide you with the care you need, when you need it. . Your physician recommends that you schedule a follow-up appointment in: end of next week with Bunnie Domino DNP. Marland Kitchen Your physician recommends that you schedule a follow-up appointment in [place]. .   Any Other Special Instructions Will Be Listed Below (If Applicable).    Studies Ordered:   Orders Placed This Encounter  Procedures  . EKG 12-Lead       Glenetta Hew, M.D., M.S. Interventional Cardiologist   Pager # [phone] Phone # [phone] [address] [place], [place] [address]   Thank you for choosing Heartcare at [facility]!!

## 2018-08-13 ENCOUNTER — Encounter: Payer: Self-pay | Admitting: Cardiology

## 2018-08-13 NOTE — Assessment & Plan Note (Signed)
Unfortunately, all of her medications were stopped in the hospital which makes sense because of the hypotension and bradycardia, but there was no plan for restarting medications in a patient who has had a history of very difficult to control blood pressure and A. fib heart rate.  Justifiably, digoxin was stopped because of elevated levels, but that was user error in taking the wrong dose. Because of her hypertension I have chosen to use carvedilol instead of metoprolol so I will therefore restart her carvedilol and gradually titrate up.  The plan will be for her to take 2 doses of her original 12.5 mg twice daily dosing I will then have her go back to 1/2 tablet twice daily for 5 days and then reevaluate symptoms of dizziness and heart rate.  If doing well, I want her to increase to a full tablet twice daily.  I do want her to follow-up shortly around the time of this up titration with 1 of my advanced practitioners to ensure a smooth transition.  In the past, she never tolerated going up to 18.75 mg of carvedilol.  Based on the fact that she was not accurately taking her digoxin, I think we probably would not continue digoxin.  In the past she did not do well with diltiazem long-term, but I think the bradycardia and hypotension in the hospital was probably more related to postop issues and digoxin.

## 2018-08-13 NOTE — Assessment & Plan Note (Signed)
ABIs being followed by Dr. Quay Burow

## 2018-08-13 NOTE — Assessment & Plan Note (Signed)
She has been hypertensive and tachycardic.  Blood pressure is still relatively high today.  Because she had some issues with hypotension in the hospital I will go immediately back to the 12.5 mg twice daily of carvedilol but will give her at least 2 loading doses before going back to 6.25 twice daily for 1 week.  We will need to gradually titrate her back to a stable regimen.

## 2018-08-13 NOTE — Assessment & Plan Note (Signed)
It is hard to say that she is euvolemic because she has significant swelling in the left leg, but it is unilateral and this is the postop leg.  She really otherwise is not no PND or orthopnea.  She is on furosemide and we talked about maybe taking a couple extra doses of furosemide a couple days a week to see if we can reduce the swelling. Otherwise though I think the foot elevation and the support hose are probably the best option.

## 2018-08-14 DIAGNOSIS — D649 Anemia, unspecified: Secondary | ICD-10-CM | POA: Diagnosis not present

## 2018-08-14 DIAGNOSIS — S31000D Unspecified open wound of lower back and pelvis without penetration into retroperitoneum, subsequent encounter: Secondary | ICD-10-CM | POA: Diagnosis not present

## 2018-08-14 DIAGNOSIS — I739 Peripheral vascular disease, unspecified: Secondary | ICD-10-CM | POA: Diagnosis not present

## 2018-08-14 DIAGNOSIS — I4891 Unspecified atrial fibrillation: Secondary | ICD-10-CM | POA: Diagnosis not present

## 2018-08-14 DIAGNOSIS — I5032 Chronic diastolic (congestive) heart failure: Secondary | ICD-10-CM | POA: Diagnosis not present

## 2018-08-14 DIAGNOSIS — Z471 Aftercare following joint replacement surgery: Secondary | ICD-10-CM | POA: Diagnosis not present

## 2018-08-14 DIAGNOSIS — Z96652 Presence of left artificial knee joint: Secondary | ICD-10-CM | POA: Diagnosis not present

## 2018-08-14 DIAGNOSIS — I2510 Atherosclerotic heart disease of native coronary artery without angina pectoris: Secondary | ICD-10-CM | POA: Diagnosis not present

## 2018-08-14 DIAGNOSIS — I110 Hypertensive heart disease with heart failure: Secondary | ICD-10-CM | POA: Diagnosis not present

## 2018-08-16 DIAGNOSIS — Z471 Aftercare following joint replacement surgery: Secondary | ICD-10-CM | POA: Diagnosis not present

## 2018-08-16 DIAGNOSIS — I739 Peripheral vascular disease, unspecified: Secondary | ICD-10-CM | POA: Diagnosis not present

## 2018-08-16 DIAGNOSIS — S31000D Unspecified open wound of lower back and pelvis without penetration into retroperitoneum, subsequent encounter: Secondary | ICD-10-CM | POA: Diagnosis not present

## 2018-08-16 DIAGNOSIS — D649 Anemia, unspecified: Secondary | ICD-10-CM | POA: Diagnosis not present

## 2018-08-16 DIAGNOSIS — I110 Hypertensive heart disease with heart failure: Secondary | ICD-10-CM | POA: Diagnosis not present

## 2018-08-16 DIAGNOSIS — I2510 Atherosclerotic heart disease of native coronary artery without angina pectoris: Secondary | ICD-10-CM | POA: Diagnosis not present

## 2018-08-16 DIAGNOSIS — Z96652 Presence of left artificial knee joint: Secondary | ICD-10-CM | POA: Diagnosis not present

## 2018-08-16 DIAGNOSIS — I5032 Chronic diastolic (congestive) heart failure: Secondary | ICD-10-CM | POA: Diagnosis not present

## 2018-08-16 DIAGNOSIS — I4891 Unspecified atrial fibrillation: Secondary | ICD-10-CM | POA: Diagnosis not present

## 2018-08-19 DIAGNOSIS — R269 Unspecified abnormalities of gait and mobility: Secondary | ICD-10-CM | POA: Diagnosis not present

## 2018-08-20 ENCOUNTER — Ambulatory Visit (INDEPENDENT_AMBULATORY_CARE_PROVIDER_SITE_OTHER): Payer: Medicare HMO | Admitting: Orthopaedic Surgery

## 2018-08-21 DIAGNOSIS — S31000D Unspecified open wound of lower back and pelvis without penetration into retroperitoneum, subsequent encounter: Secondary | ICD-10-CM | POA: Diagnosis not present

## 2018-08-21 DIAGNOSIS — I4891 Unspecified atrial fibrillation: Secondary | ICD-10-CM | POA: Diagnosis not present

## 2018-08-21 DIAGNOSIS — Z471 Aftercare following joint replacement surgery: Secondary | ICD-10-CM | POA: Diagnosis not present

## 2018-08-21 DIAGNOSIS — Z96652 Presence of left artificial knee joint: Secondary | ICD-10-CM | POA: Diagnosis not present

## 2018-08-21 DIAGNOSIS — D649 Anemia, unspecified: Secondary | ICD-10-CM | POA: Diagnosis not present

## 2018-08-21 DIAGNOSIS — I2510 Atherosclerotic heart disease of native coronary artery without angina pectoris: Secondary | ICD-10-CM | POA: Diagnosis not present

## 2018-08-21 DIAGNOSIS — I110 Hypertensive heart disease with heart failure: Secondary | ICD-10-CM | POA: Diagnosis not present

## 2018-08-21 DIAGNOSIS — I5032 Chronic diastolic (congestive) heart failure: Secondary | ICD-10-CM | POA: Diagnosis not present

## 2018-08-21 DIAGNOSIS — I739 Peripheral vascular disease, unspecified: Secondary | ICD-10-CM | POA: Diagnosis not present

## 2018-08-23 ENCOUNTER — Ambulatory Visit: Payer: Medicare HMO | Admitting: Physician Assistant

## 2018-08-23 ENCOUNTER — Encounter: Payer: Self-pay | Admitting: Physician Assistant

## 2018-08-23 VITALS — BP 142/85 | HR 94 | Ht 60.0 in | Wt 146.0 lb

## 2018-08-23 DIAGNOSIS — Z79899 Other long term (current) drug therapy: Secondary | ICD-10-CM | POA: Diagnosis not present

## 2018-08-23 DIAGNOSIS — E785 Hyperlipidemia, unspecified: Secondary | ICD-10-CM | POA: Diagnosis not present

## 2018-08-23 DIAGNOSIS — I5032 Chronic diastolic (congestive) heart failure: Secondary | ICD-10-CM | POA: Diagnosis not present

## 2018-08-23 DIAGNOSIS — I2510 Atherosclerotic heart disease of native coronary artery without angina pectoris: Secondary | ICD-10-CM | POA: Diagnosis not present

## 2018-08-23 DIAGNOSIS — Z471 Aftercare following joint replacement surgery: Secondary | ICD-10-CM | POA: Diagnosis not present

## 2018-08-23 DIAGNOSIS — I482 Chronic atrial fibrillation, unspecified: Secondary | ICD-10-CM | POA: Diagnosis not present

## 2018-08-23 DIAGNOSIS — I10 Essential (primary) hypertension: Secondary | ICD-10-CM

## 2018-08-23 DIAGNOSIS — Z96652 Presence of left artificial knee joint: Secondary | ICD-10-CM | POA: Diagnosis not present

## 2018-08-23 DIAGNOSIS — I739 Peripheral vascular disease, unspecified: Secondary | ICD-10-CM

## 2018-08-23 DIAGNOSIS — I110 Hypertensive heart disease with heart failure: Secondary | ICD-10-CM | POA: Diagnosis not present

## 2018-08-23 DIAGNOSIS — D649 Anemia, unspecified: Secondary | ICD-10-CM | POA: Diagnosis not present

## 2018-08-23 DIAGNOSIS — I4891 Unspecified atrial fibrillation: Secondary | ICD-10-CM | POA: Diagnosis not present

## 2018-08-23 DIAGNOSIS — S31000D Unspecified open wound of lower back and pelvis without penetration into retroperitoneum, subsequent encounter: Secondary | ICD-10-CM | POA: Diagnosis not present

## 2018-08-23 MED ORDER — CARVEDILOL 12.5 MG PO TABS: 18.7500 mg | ORAL_TABLET | Freq: Two times a day (BID) | ORAL | 3 refills | Status: DC

## 2018-08-23 NOTE — Patient Instructions (Addendum)
Medication Instructions:  INCREASE Lasix to 80 Mg 2 times a day for 2 days  INCREASE Potassium 40 MEQ to 3 times a day for 2 days  After the 2 days go back to taking your LASIX 40 Mg 2 times a day and your POTASSIUM to 40 MEQ 2 times a day INCREASE CARVEDILOL TO 18.75MG  1.5 TABLETS IN THE MORNINGS AND 1.5 TABLETS IN THE EVENINGS   If you need a refill on your cardiac medications before your next appointment, please call your pharmacy.   Lab work: You will have labs (blood work) drawn today:  CBC  BMET  If you have labs (blood work) drawn today and your tests are completely normal, you will receive your results only by: Marland Kitchen MyChart Message (if you have MyChart) OR . A paper copy in the mail If you have any lab test that is abnormal or we need to change your treatment, we will call you to review the results.  Testing/Procedures: NONE   Follow-Up: Your physician recommends that you KEEP your follow-up appointment on August 30, 2018 at 12 PM with Sara Deforest, PA-C   Any Other Special Instructions Will Be Listed Below (If Applicable).

## 2018-08-23 NOTE — Progress Notes (Signed)
Cardiology Office Note    Date:  08/25/2018   ID:  Sara [name], Sara [name] 28-Oct-1925, MRN [id number]  PCP:  Sara Roger, MD  Cardiologist: Dr. Ellyn Hack  Chief Complaint  Patient presents with  . Follow-up    seen for Dr. Ellyn Hack.     History of Present Illness:  Sara [name] is a 85 y.o. female with past medical history of chronic diastolic heart failure exacerbated by A. fib, chronic atrial fibrillation, hypertension, hyperlipidemia, PAD, and a remote history of syncope.  She is not on systemic anticoagulation therapy due to her advanced age and fall risk.  Patient underwent peripheral arterial angiography and PTCA/PCI of SFA in September 2019.  Patient was instructed to continue Plavix for at least 3 months.  Patient eventually underwent total knee arthroplasty in January 2020.  Postop course was complicated by dizziness and bradycardia.  Digoxin level was high, as patient was taking the wrong dose and had never reduced to the low-dose.  Diltiazem was stopped.  Carvedilol was cut back initially.  Dr. Harrington Challenger recommended started on metoprolol 25 mg twice daily, however this was not started.  Patient was discharged on no cardiac medication other than aspirin and Plavix.  She did go to the [facility] in February for pneumonia.  However none of her cardiac medication was restarted.  By the time she returned for follow-up on 08/12/2018, she was tachycardic in atrial fibrillation with heart rate ranging in the 100-140s.  Carvedilol was restarted at 6.25 mg twice daily and was eventually increased to 12.5 mg twice daily.  He has had left lower extremity edema after the previous surgery. Recent venous Doppler of the lower extremity obtained on 08/08/2018 was negative for DVT.  Patient returns today for follow-up.  She says the Plavix gave her the shakes.  I checked with Dr. Gwenlyn Found, it is recommended she continue the Plavix for at least 6 to 12 months after lower extremity stent placement.  I  managed to convince her to continue on the Plavix for the time being.  Her heart rate remained poorly controlled, I will increase her carvedilol to 18.75 mg twice daily.  She had a previous history of intolerance with a higher dose of carvedilol.  At this time I wish to hold off on restarting the digoxin or the diltiazem.  I will attempt to use the carvedilol alone to rate control her.  She presents to the office along with her sister today.  On physical exam, she does have more swelling in the left lower extremity.  I suspect she is mildly volume overloaded even though she has chronic swelling.  I increased her Lasix to 80 mg twice daily for 2 days before going back to the previous dose.  For a 85 year old, I am hesitant to permanently increase the diuretic dosage.    Past Medical History:  Diagnosis Date  . Ankle edema    Mild, which is intermittent, usually mildly dependent, left slightly greater than the right.  . Anxiety   . Arthritis    In her knees  . Balance problem    Due to weakened knee  . Chronic diastolic heart failure (HCC)    Class I-II -- exacerbated by Afib RVR  . Corneal edema    Severe corneal edema in right eye with multiple folds. Developed after cataract surgery 05/03/09  . Dysrhythmia    a-fib    since aghe 85 yrs old  . GERD (gastroesophageal reflux disease)   .  Hematuria    Resolved after coumadin was stopped  . Hiatal hernia    With GERD  . History of breast cancer   . HOH (hard of hearing)   . Hyperglycemia    Random, without any history of diabetes  . Hyperlipidemia   . Hypertension    Difficult to control. Renal Doppler 06/02/10 showed less that 60% bilateral renal artery narrowing.  . Hypokalemia   . PAD (peripheral artery disease) ([place])   . Permanent atrial fibrillation    Rate control.  ASA.  NOT on DOAC or warfarin due to concern for falls &bleeding.  Marland Kitchen Posthemorrhagic anemia   . Pseudophakia   . Syncope 02/22/11   During OV on 02/22/11 her INR was  7.3 and was given 2.5 mg of vitamin K. Later that day she became diaphoretic & fainted.  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 ABDOMINAL HYSTERECTOMY    . BREAST BIOPSY     3 biopsies  . CARDIOVERSION N/A 03/31/2016   Procedure: CARDIOVERSION;  Surgeon: Jerline Pain, MD;  Location: [facility] ENDOSCOPY;  Service: Cardiovascular: Successful cardioversion from A. fib to sinus rhythm  . CATARACT EXTRACTION  05/03/09  . CHOLECYSTECTOMY    . EYE SURGERY     cataract  . HEMORRHOID SURGERY    . LOWER EXTREMITY ANGIOGRAPHY Right 03/18/2018   Procedure: LOWER EXTREMITY ANGIOGRAPHY;  Surgeon: Lorretta Harp, MD;  Location: [place] CV [facility];  Service: Cardiovascular;  Laterality: Right;  . PERIPHERAL VASCULAR INTERVENTION  03/18/2018   Procedure: PERIPHERAL VASCULAR INTERVENTION;  Surgeon: Lorretta Harp, MD;  Location: [place] [facility];  Service: Cardiovascular;;  left SFA  . Remote hysterectomy    . TEE WITHOUT CARDIOVERSION N/A 03/31/2016   Procedure: TRANSESOPHAGEAL ECHOCARDIOGRAM (TEE);  Surgeon: Jerline Pain, MD;  Location: [place];  Service: Cardiovascular:  EF 55-60%. No vegetation or thrombus okay for DC CV  . TONSILLECTOMY    . TOTAL KNEE ARTHROPLASTY Right 08/26/10   By Dr. Elta Guadeloupe C. Yates. Cemented, computer assist  . TOTAL KNEE ARTHROPLASTY Left 07/08/2018   Procedure: LEFT TOTAL KNEE ARTHROPLASTY CEMENTED;  Surgeon: Marybelle Killings, MD;  Location: [place];  Service: Orthopedics;  Laterality: Left;  . TRANSTHORACIC ECHOCARDIOGRAM  03/2016    Mild LVH. EF 65-70%. High LVEDP no RWMA. Severe LA dilation. Peak PAP ~ 37 mmHg    Current Medications: Outpatient Medications Prior to Visit  Medication Sig Dispense Refill  . aspirin EC 81 MG tablet Take 81 mg by mouth daily.    . cholecalciferol (VITAMIN D3) 25 MCG (1000 UT) tablet Take 2,000 Units by mouth daily.    . clopidogrel (PLAVIX) 75 MG tablet Take 1 tablet (75 mg  total) by mouth daily with breakfast. 90 tablet 9  . furosemide (LASIX) 40 MG tablet TAKE ONE TABLET BY MOUTH TWICE DAILY 60 tablet 6  . potassium chloride SA (K-DUR,KLOR-CON) 20 MEQ tablet Take 2 tablets (40 mEq total) by mouth 2 (two) times daily. 180 tablet 4  . rOPINIRole (REQUIP) 1 MG tablet Take 1 tablet by mouth at bedtime.   3  . carvedilol (COREG) 12.5 MG tablet Take 1 tablet (12.5 mg total) by mouth 2 (two) times daily. 180 tablet 3   No facility-administered medications prior to visit.      Allergies:   Amiodarone; Aliskiren; Amlodipine besylate-valsartan; Bystolic [nebivolol hcl]; Clonidine derivatives; Dronedarone; Hctz [hydrochlorothiazide]; Hydralazine; Lisinopril; Nebivolol;  Olmesartan; Oxycodone; Rythmol [propafenone]; Statins; Carvedilol; and Prednisone   Social History   Socioeconomic History  . Marital status: Widowed    Spouse name: Not on file  . Number of children: 1  . Years of education: Not on file  . Highest education level: Not on file  Occupational History    Employer: RETIRED   Social Needs  . Financial resource strain: Not on file  . Food insecurity:    Worry: Not on file    Inability: Not on file  . Transportation needs:    Medical: Not on file    Non-medical: Not on file  Tobacco Use  . Smoking status: Never Smoker  . Smokeless tobacco: Never Used  Substance and Sexual Activity  . Alcohol use: No  . Drug use: No  . Sexual activity: Not on file  Lifestyle  . Physical activity:    Days per week: Not on file    Minutes per session: Not on file  . Stress: Not on file  Relationships  . Social connections:    Talks on phone: Not on file    Gets together: Not on file    Attends religious service: Not on file    Active member of club or organization: Not on file    Attends meetings of clubs or organizations: Not on file    Relationship status: Not on file  Other Topics Concern  . Not on file  Social History Narrative   She is a widowed  mother of one and grandmother of one.  She does not exercise.  She does not smoke and does not drink alcohol.     Family History:  The patient's family history includes Cancer in her mother; Coronary artery disease in her brother; Diabetes type II in her brother; Heart attack in her sister; Hypertension in her brother, mother, and sister.   ROS:   Please see the history of present illness.    ROS All other systems reviewed and are negative.   PHYSICAL EXAM:   VS:  BP (!) 142/85   Pulse 94   Ht 5' (1.524 m)   Wt 146 lb (66.2 kg)   BMI 28.51 kg/m    GEN: Well nourished, well developed, in no acute distress  HEENT: normal  Neck: no JVD, carotid bruits, or masses Cardiac: Irregularly irregular; no murmurs, rubs, or gallops. LLE edema  Respiratory:  clear to auscultation bilaterally, normal work of breathing GI: soft, nontender, nondistended, + BS MS: no deformity or atrophy  Skin: warm and dry, no rash Neuro:  Alert and Oriented x 3, Strength and sensation are intact Psych: euthymic mood, full affect  Wt Readings from Last 3 Encounters:  08/23/18 146 lb (66.2 kg)  08/12/18 146 lb 8 oz (66.5 kg)  08/07/18 147 lb (66.7 kg)      Studies/Labs Reviewed:   EKG:  EKG is not ordered today.    Recent Labs: 01/17/2018: Magnesium 2.4 02/27/2018: TSH 1.620 06/28/2018: ALT 16 08/23/2018: BUN 17; Creatinine, Ser 0.72; Hemoglobin 11.5; Platelets 313; Potassium 4.4; Sodium 145   Lipid Panel No results found for: CHOL, TRIG, HDL, CHOLHDL, VLDL, LDLCALC, LDLDIRECT  Additional studies/ records that were reviewed today include:   TEE 03/31/2016 LV EF: 55% -   60%  ------------------------------------------------------------------- Indications:      Atrial fibrillation - 427.31.  ------------------------------------------------------------------- Study Conclusions  - Left ventricle: Systolic function was normal. The estimated   ejection fraction was in the range of 55% to 60%. -  Aortic valve: No evidence of vegetation. - Mitral valve: No evidence of vegetation. There was mild   regurgitation. - Left atrium: The atrium was mildly dilated. No evidence of   thrombus in the appendage. No evidence of thrombus in the   appendage. - Right atrium: No evidence of thrombus in the atrial cavity or   appendage. - Tricuspid valve: No evidence of vegetation. There was mild   regurgitation. - Pulmonic valve: No evidence of vegetation. - Superior vena cava: The study excluded a thrombus.    ASSESSMENT:    1. Chronic diastolic CHF (congestive heart failure), NYHA class 1 ([place])   2. Medication management   3. Peripheral arterial disease ([place])   4. Atrial fibrillation, chronic   5. Essential hypertension   6. Hyperlipidemia LDL goal <70      PLAN:  In order of problems listed above:  1. Chronic diastolic heart failure: She appears to be mildly volume overloaded.  Even though her left lower extremity edema is likely related to the surgery.  Recent venous Doppler was negative for DVT.  I think she is mildly volume overloaded, therefore I recommended for her to increase the Lasix and potassium supplement for 2 days before going back to the previous dose.  2. Chronic atrial fibrillation: Heart rate remain poorly controlled, I will increase carvedilol to 18.75 mg twice daily.  She has a remote history of intolerance with a higher dose of carvedilol.  She also had hypotension and bradycardia recently, prompting the discontinuation of diltiazem and digoxin.  3. Hypertension: Blood pressure borderline elevated, increase carvedilol to 18.75 mg twice daily  4. Peripheral arterial disease: No significant leg pain  5. Hyperlipidemia: Intolerant to statins.    Medication Adjustments/Labs and Tests Ordered: Current medicines are reviewed at length with the patient today.  Concerns regarding medicines are outlined above.  Medication changes, Labs and Tests ordered today are listed  in the Patient Instructions below. Patient Instructions  Medication Instructions:  INCREASE Lasix to 80 Mg 2 times a day for 2 days  INCREASE Potassium 40 MEQ to 3 times a day for 2 days  After the 2 days go back to taking your LASIX 40 Mg 2 times a day and your POTASSIUM to 40 MEQ 2 times a day INCREASE CARVEDILOL TO 18.75MG  1.5 TABLETS IN THE MORNINGS AND 1.5 TABLETS IN THE EVENINGS   If you need a refill on your cardiac medications before your next appointment, please call your pharmacy.   Lab work: You will have labs (blood work) drawn today:  CBC  BMET  If you have labs (blood work) drawn today and your tests are completely normal, you will receive your results only by: Marland Kitchen MyChart Message (if you have MyChart) OR . A paper copy in the mail If you have any lab test that is abnormal or we need to change your treatment, we will call you to review the results.  Testing/Procedures: NONE   Follow-Up: Your physician recommends that you KEEP your follow-up appointment on August 30, 2018 at 12 PM with Almyra Deforest, PA-C   Any Other Special Instructions Will Be Listed Below (If Applicable).       Hilbert Corrigan, [place]  08/25/2018 11:27 PM    [facility] [place], [place], [place]  [address] Phone: [phone]; Fax: [phone]

## 2018-08-24 LAB — CBC
Hematocrit: 36.9 % (ref 34.0–46.6)
Hemoglobin: 11.5 g/dL (ref 11.1–15.9)
MCH: 27 pg (ref 26.6–33.0)
MCHC: 31.2 g/dL — ABNORMAL LOW (ref 31.5–35.7)
MCV: 87 fL (ref 79–97)
Platelets: 313 10*3/uL (ref 150–450)
RBC: 4.26 x10E6/uL (ref 3.77–5.28)
RDW: 13.7 % (ref 11.7–15.4)
WBC: 5.5 10*3/uL (ref 3.4–10.8)

## 2018-08-24 LAB — BASIC METABOLIC PANEL
BUN/Creatinine Ratio: 24 (ref 12–28)
BUN: 17 mg/dL (ref 10–36)
CO2: 22 mmol/L (ref 20–29)
Calcium: 9.3 mg/dL (ref 8.7–10.3)
Chloride: 107 mmol/L — ABNORMAL HIGH (ref 96–106)
Creatinine, Ser: 0.72 mg/dL (ref 0.57–1.00)
GFR calc Af Amer: 85 mL/min/{1.73_m2} (ref >59)
GFR calc non Af Amer: 74 mL/min/{1.73_m2} (ref >59)
Glucose: 89 mg/dL (ref 65–99)
Potassium: 4.4 mmol/L (ref 3.5–5.2)
Sodium: 145 mmol/L — ABNORMAL HIGH (ref 134–144)

## 2018-08-25 ENCOUNTER — Encounter: Payer: Self-pay | Admitting: Physician Assistant

## 2018-08-27 ENCOUNTER — Telehealth (INDEPENDENT_AMBULATORY_CARE_PROVIDER_SITE_OTHER): Payer: Self-pay | Admitting: Orthopaedic Surgery

## 2018-08-27 DIAGNOSIS — I5032 Chronic diastolic (congestive) heart failure: Secondary | ICD-10-CM | POA: Diagnosis not present

## 2018-08-27 DIAGNOSIS — I110 Hypertensive heart disease with heart failure: Secondary | ICD-10-CM | POA: Diagnosis not present

## 2018-08-27 DIAGNOSIS — I739 Peripheral vascular disease, unspecified: Secondary | ICD-10-CM | POA: Diagnosis not present

## 2018-08-27 DIAGNOSIS — I2510 Atherosclerotic heart disease of native coronary artery without angina pectoris: Secondary | ICD-10-CM | POA: Diagnosis not present

## 2018-08-27 DIAGNOSIS — D649 Anemia, unspecified: Secondary | ICD-10-CM | POA: Diagnosis not present

## 2018-08-27 DIAGNOSIS — Z96652 Presence of left artificial knee joint: Secondary | ICD-10-CM | POA: Diagnosis not present

## 2018-08-27 DIAGNOSIS — Z471 Aftercare following joint replacement surgery: Secondary | ICD-10-CM | POA: Diagnosis not present

## 2018-08-27 DIAGNOSIS — S31000D Unspecified open wound of lower back and pelvis without penetration into retroperitoneum, subsequent encounter: Secondary | ICD-10-CM | POA: Diagnosis not present

## 2018-08-27 DIAGNOSIS — I4891 Unspecified atrial fibrillation: Secondary | ICD-10-CM | POA: Diagnosis not present

## 2018-08-27 NOTE — Progress Notes (Signed)
The patient has been notified of the result and verbalized understanding.  All questions (if any) were answered. Sara [name], [place] 08/27/2018 5:05 PM

## 2018-08-27 NOTE — Telephone Encounter (Signed)
Ok for orders? 

## 2018-08-27 NOTE — Telephone Encounter (Signed)
Ok thanks 

## 2018-08-27 NOTE — Telephone Encounter (Signed)
Magda Paganini from [facility] called to request VO for the following:  1x a week for1 week 2x a week for 2 weeks 1x a week for 2 weeks  CB#[phone].  You can leave a message if she does not answer.  Thank you

## 2018-08-28 ENCOUNTER — Telehealth (INDEPENDENT_AMBULATORY_CARE_PROVIDER_SITE_OTHER): Payer: Self-pay | Admitting: Radiology

## 2018-08-28 DIAGNOSIS — I110 Hypertensive heart disease with heart failure: Secondary | ICD-10-CM | POA: Diagnosis not present

## 2018-08-28 DIAGNOSIS — Z471 Aftercare following joint replacement surgery: Secondary | ICD-10-CM | POA: Diagnosis not present

## 2018-08-28 DIAGNOSIS — Z96652 Presence of left artificial knee joint: Secondary | ICD-10-CM | POA: Diagnosis not present

## 2018-08-28 DIAGNOSIS — I2510 Atherosclerotic heart disease of native coronary artery without angina pectoris: Secondary | ICD-10-CM | POA: Diagnosis not present

## 2018-08-28 DIAGNOSIS — I5032 Chronic diastolic (congestive) heart failure: Secondary | ICD-10-CM | POA: Diagnosis not present

## 2018-08-28 DIAGNOSIS — I739 Peripheral vascular disease, unspecified: Secondary | ICD-10-CM | POA: Diagnosis not present

## 2018-08-28 DIAGNOSIS — S31000D Unspecified open wound of lower back and pelvis without penetration into retroperitoneum, subsequent encounter: Secondary | ICD-10-CM | POA: Diagnosis not present

## 2018-08-28 DIAGNOSIS — D649 Anemia, unspecified: Secondary | ICD-10-CM | POA: Diagnosis not present

## 2018-08-28 DIAGNOSIS — I4891 Unspecified atrial fibrillation: Secondary | ICD-10-CM | POA: Diagnosis not present

## 2018-08-28 NOTE — Telephone Encounter (Signed)
Corene Cornea called and wanted to update patients status.  He states that she is walking 120 feet at a time, she is balancing 30 seconds eyes open and feet together, she is doing 6 sit to stands in 30 seconds.  Corene Cornea states that since her balance is not that great that he would like to continue HHPT 2 times a week for the 4 more weeks.--He was hard to understand to breaking up in the phone connection.

## 2018-08-28 NOTE — Telephone Encounter (Signed)
IC verbal given.  

## 2018-08-28 NOTE — Telephone Encounter (Signed)
Please advise. Thanks.  

## 2018-08-29 DIAGNOSIS — I5032 Chronic diastolic (congestive) heart failure: Secondary | ICD-10-CM | POA: Diagnosis not present

## 2018-08-29 DIAGNOSIS — I4891 Unspecified atrial fibrillation: Secondary | ICD-10-CM | POA: Diagnosis not present

## 2018-08-29 DIAGNOSIS — S31000D Unspecified open wound of lower back and pelvis without penetration into retroperitoneum, subsequent encounter: Secondary | ICD-10-CM | POA: Diagnosis not present

## 2018-08-29 DIAGNOSIS — I110 Hypertensive heart disease with heart failure: Secondary | ICD-10-CM | POA: Diagnosis not present

## 2018-08-29 DIAGNOSIS — D649 Anemia, unspecified: Secondary | ICD-10-CM | POA: Diagnosis not present

## 2018-08-29 DIAGNOSIS — I2510 Atherosclerotic heart disease of native coronary artery without angina pectoris: Secondary | ICD-10-CM | POA: Diagnosis not present

## 2018-08-29 DIAGNOSIS — I739 Peripheral vascular disease, unspecified: Secondary | ICD-10-CM | POA: Diagnosis not present

## 2018-08-29 DIAGNOSIS — Z96652 Presence of left artificial knee joint: Secondary | ICD-10-CM | POA: Diagnosis not present

## 2018-08-29 DIAGNOSIS — Z471 Aftercare following joint replacement surgery: Secondary | ICD-10-CM | POA: Diagnosis not present

## 2018-08-29 NOTE — Telephone Encounter (Signed)
Ok

## 2018-08-30 ENCOUNTER — Ambulatory Visit: Payer: Medicare HMO | Admitting: Physician Assistant

## 2018-08-30 ENCOUNTER — Encounter: Payer: Self-pay | Admitting: Physician Assistant

## 2018-08-30 ENCOUNTER — Other Ambulatory Visit: Payer: Self-pay

## 2018-08-30 VITALS — BP 118/62 | HR 89 | Ht 60.0 in | Wt 150.8 lb

## 2018-08-30 DIAGNOSIS — I5032 Chronic diastolic (congestive) heart failure: Secondary | ICD-10-CM

## 2018-08-30 DIAGNOSIS — R600 Localized edema: Secondary | ICD-10-CM | POA: Diagnosis not present

## 2018-08-30 DIAGNOSIS — I739 Peripheral vascular disease, unspecified: Secondary | ICD-10-CM | POA: Diagnosis not present

## 2018-08-30 DIAGNOSIS — I10 Essential (primary) hypertension: Secondary | ICD-10-CM | POA: Diagnosis not present

## 2018-08-30 DIAGNOSIS — E785 Hyperlipidemia, unspecified: Secondary | ICD-10-CM | POA: Diagnosis not present

## 2018-08-30 DIAGNOSIS — Z79899 Other long term (current) drug therapy: Secondary | ICD-10-CM | POA: Diagnosis not present

## 2018-08-30 DIAGNOSIS — I4811 Longstanding persistent atrial fibrillation: Secondary | ICD-10-CM

## 2018-08-30 MED ORDER — FUROSEMIDE 40 MG PO TABS: ORAL_TABLET | ORAL | 6 refills | Status: DC

## 2018-08-30 NOTE — Progress Notes (Signed)
Cardiology Office Note    Date:  09/01/2018   ID:  Thailyn Khalid, DOB 10/24/1925, MRN [id number]  PCP:  Townsend Roger, MD  Cardiologist:  Dr. Ellyn Hack   Chief Complaint  Patient presents with  . Follow-up    seen for Dr. Ellyn Hack    History of Present Illness:  Sara [name] is a 85 y.o. female with past medical history of chronic diastolic heart failure exacerbated by A. fib, chronic atrial fibrillation, hypertension, hyperlipidemia, PAD, and a remote history of syncope.  She is not on systemic anticoagulation therapy due to her advanced age and fall risk.  Patient underwent peripheral arterial angiography and PTCA/PCI of SFA in September 2019.  Patient was instructed to continue Plavix for at least 3 months.  She eventually underwent total knee arthroplasty in January 2020.  Postop course was complicated by dizziness and bradycardia.  Digoxin level was high, as patient was taking the wrong dose and had never reduced to the low-dose.  Diltiazem was stopped.  Carvedilol was cut back initially.  Dr. Harrington Challenger recommended started on metoprolol 25 mg twice daily, however this was not started.  Patient was discharged on no cardiac medication other than aspirin and Plavix.  She did go to the [facility] in February for pneumonia.  However none of her cardiac medication was restarted.  By the time she returned for follow-up on 08/12/2018, she was tachycardic in atrial fibrillation with heart rate ranging in the 100-140s.  Carvedilol was restarted at 6.25 mg twice daily and was eventually increased to 12.5 mg twice daily.  She has had left lower extremity edema after the previous surgery. Recent venous Doppler of the lower extremity obtained on 08/08/2018 was negative for DVT.  I last saw the patient on 08/23/2018, her heart rate was poorly controlled, I increased her carvedilol to 18.75 mg twice daily.  I also increased her Lasix to 80 mg twice daily for 2 days before going back to the previous dose  due to lower extremity edema.  Due to her advanced age, I was hesitant to permanently increase the diuretic dosage.  Patient presents today for follow-up along with her sister.  She continued to have left lower extremity edema.  I increased her Lasix to 80 mg a.m. and 40 mg p.m. permanently.  She will need to have basic metabolic panel next week.  Although I instructed her to increase carvedilol to 18.75 mg twice daily, she only did this for 2 days before converting back to the previous dose.  I asked her to resume 18.75 mg twice daily dosing.   Past Medical History:  Diagnosis Date  . Ankle edema    Mild, which is intermittent, usually mildly dependent, left slightly greater than the right.  . Anxiety   . Arthritis    In her knees  . Balance problem    Due to weakened knee  . Chronic diastolic heart failure (HCC)    Class I-II -- exacerbated by Afib RVR  . Corneal edema    Severe corneal edema in right eye with multiple folds. Developed after cataract surgery 05/03/09  . Dysrhythmia    a-fib    since aghe 85 yrs old  . GERD (gastroesophageal reflux disease)   . Hematuria    Resolved after coumadin was stopped  . Hiatal hernia    With GERD  . History of breast cancer   . HOH (hard of hearing)   . Hyperglycemia    Random, without any  history of diabetes  . Hyperlipidemia   . Hypertension    Difficult to control. Renal Doppler 06/02/10 showed less that 60% bilateral renal artery narrowing.  . Hypokalemia   . PAD (peripheral artery disease) ([place])   . Permanent atrial fibrillation    Rate control.  ASA.  NOT on DOAC or warfarin due to concern for falls &bleeding.  Marland Kitchen Posthemorrhagic anemia   . Pseudophakia   . Syncope 02/22/11   During OV on 02/22/11 her INR was 7.3 and was given 2.5 mg of vitamin K. Later that day she became diaphoretic & fainted.  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 ABDOMINAL HYSTERECTOMY    .  BREAST BIOPSY     3 biopsies  . CARDIOVERSION N/A 03/31/2016   Procedure: CARDIOVERSION;  Surgeon: Jerline Pain, MD;  Location: [facility] ENDOSCOPY;  Service: Cardiovascular: Successful cardioversion from A. fib to sinus rhythm  . CATARACT EXTRACTION  05/03/09  . CHOLECYSTECTOMY    . EYE SURGERY     cataract  . HEMORRHOID SURGERY    . LOWER EXTREMITY ANGIOGRAPHY Right 03/18/2018   Procedure: LOWER EXTREMITY ANGIOGRAPHY;  Surgeon: Lorretta Harp, MD;  Location: [place] [facility];  Service: Cardiovascular;  Laterality: Right;  . PERIPHERAL VASCULAR INTERVENTION  03/18/2018   Procedure: PERIPHERAL VASCULAR INTERVENTION;  Surgeon: Lorretta Harp, MD;  Location: [place] [facility];  Service: Cardiovascular;;  left SFA  . Remote hysterectomy    . TEE WITHOUT CARDIOVERSION N/A 03/31/2016   Procedure: TRANSESOPHAGEAL ECHOCARDIOGRAM (TEE);  Surgeon: Jerline Pain, MD;  Location: [place];  Service: Cardiovascular:  EF 55-60%. No vegetation or thrombus okay for DC CV  . TONSILLECTOMY    . TOTAL KNEE ARTHROPLASTY Right 08/26/10   By Dr. Elta Guadeloupe C. Yates. Cemented, computer assist  . TOTAL KNEE ARTHROPLASTY Left 07/08/2018   Procedure: LEFT TOTAL KNEE ARTHROPLASTY CEMENTED;  Surgeon: Marybelle Killings, MD;  Location: [place];  Service: Orthopedics;  Laterality: Left;  . TRANSTHORACIC ECHOCARDIOGRAM  03/2016    Mild LVH. EF 65-70%. High LVEDP no RWMA. Severe LA dilation. Peak PAP ~ 37 mmHg    Current Medications: Outpatient Medications Prior to Visit  Medication Sig Dispense Refill  . aspirin EC 81 MG tablet Take 81 mg by mouth daily.    . carvedilol (COREG) 12.5 MG tablet Take 1.5 tablets (18.75 mg total) by mouth 2 (two) times daily. 180 tablet 3  . cholecalciferol (VITAMIN D3) 25 MCG (1000 UT) tablet Take 2,000 Units by mouth daily.    . clopidogrel (PLAVIX) 75 MG tablet Take 1 tablet (75 mg total) by mouth daily with breakfast. 90 tablet 9  . potassium chloride SA (K-DUR,KLOR-CON) 20 MEQ tablet Take 2  tablets (40 mEq total) by mouth 2 (two) times daily. 180 tablet 4  . rOPINIRole (REQUIP) 1 MG tablet Take 1 tablet by mouth at bedtime.   3  . furosemide (LASIX) 40 MG tablet TAKE ONE TABLET BY MOUTH TWICE DAILY 60 tablet 6   No facility-administered medications prior to visit.      Allergies:   Amiodarone; Aliskiren; Amlodipine besylate-valsartan; Bystolic [nebivolol hcl]; Clonidine derivatives; Dronedarone; Hctz [hydrochlorothiazide]; Hydralazine; Lisinopril; Nebivolol; Olmesartan; Oxycodone; Rythmol [propafenone]; Statins; Carvedilol; and Prednisone   Social History   Socioeconomic History  . Marital status: Widowed    Spouse name: Not on file  . Number of children: 1  . Years of education: Not  on file  . Highest education level: Not on file  Occupational History    Employer: RETIRED   Social Needs  . Financial resource strain: Not on file  . Food insecurity:    Worry: Not on file    Inability: Not on file  . Transportation needs:    Medical: Not on file    Non-medical: Not on file  Tobacco Use  . Smoking status: Never Smoker  . Smokeless tobacco: Never Used  Substance and Sexual Activity  . Alcohol use: No  . Drug use: No  . Sexual activity: Not on file  Lifestyle  . Physical activity:    Days per week: Not on file    Minutes per session: Not on file  . Stress: Not on file  Relationships  . Social connections:    Talks on phone: Not on file    Gets together: Not on file    Attends religious service: Not on file    Active member of club or organization: Not on file    Attends meetings of clubs or organizations: Not on file    Relationship status: Not on file  Other Topics Concern  . Not on file  Social History Narrative   She is a widowed mother of one and grandmother of one.  She does not exercise.  She does not smoke and does not drink alcohol.     Family History:  The patient's family history includes Cancer in her mother; Coronary artery disease in her  brother; Diabetes type II in her brother; Heart attack in her sister; Hypertension in her brother, mother, and sister.   ROS:   Please see the history of present illness.    ROS All other systems reviewed and are negative.   PHYSICAL EXAM:   VS:  BP 118/62   Pulse 89   Ht 5' (1.524 m)   Wt 150 lb 12.8 oz (68.4 kg)   SpO2 98%   BMI 29.45 kg/m    GEN: Well nourished, well developed, in no acute distress  HEENT: normal  Neck: no JVD, carotid bruits, or masses Cardiac: Irregularly irregular; no murmurs, rubs, or gallops. left leg edema  Respiratory:  clear to auscultation bilaterally, normal work of breathing GI: soft, nontender, nondistended, + BS MS: no deformity or atrophy  Skin: warm and dry, no rash Neuro:  Alert and Oriented x 3, Strength and sensation are intact Psych: euthymic mood, full affect  Wt Readings from Last 3 Encounters:  08/30/18 150 lb 12.8 oz (68.4 kg)  08/23/18 146 lb (66.2 kg)  08/12/18 146 lb 8 oz (66.5 kg)      Studies/Labs Reviewed:   EKG:  EKG is ordered today.  The ekg ordered today demonstrates atrial fibrillation with RBBB  Recent Labs: 01/17/2018: Magnesium 2.4 02/27/2018: TSH 1.620 06/28/2018: ALT 16 08/23/2018: BUN 17; Creatinine, Ser 0.72; Hemoglobin 11.5; Platelets 313; Potassium 4.4; Sodium 145   Lipid Panel No results found for: CHOL, TRIG, HDL, CHOLHDL, VLDL, LDLCALC, LDLDIRECT  Additional studies/ records that were reviewed today include:   Echo 03/30/2016 LV EF: 65% -   70%  ------------------------------------------------------------------- Indications:      Atrial fibrillation - 427.31.  ------------------------------------------------------------------- History:   PMH:   Atrial fibrillation.  Risk factors: Hypertension.  ------------------------------------------------------------------- Study Conclusions  - Left ventricle: The cavity size was normal. Wall thickness was   increased in a pattern of mild LVH. Systolic  function was   vigorous. The estimated ejection fraction was in the  range of 65%   to 70%. Wall motion was normal; there were no regional wall   motion abnormalities. Doppler parameters are consistent with high   ventricular filling pressure. - Aortic valve: There was trivial regurgitation. - Mitral valve: Calcified annulus. Mildly thickened leaflets . - Left atrium: The atrium was severely dilated. - Right atrium: The atrium was mildly dilated. - Pulmonary arteries: Systolic pressure was mildly increased. PA   peak pressure: 37 mm Hg (S).  Impressions:  - Vigorous LV systolic function with intracavitary gradient of 1.7   m/s; mild LVH; elevated LV filling pressure; biatrial   enlargement; trace AI; mild TR with mildly elevated pulmonary   pressure.    ASSESSMENT:    1. Longstanding persistent atrial fibrillation   2. Medication management   3. Chronic diastolic (congestive) heart failure ([place])   4. Essential hypertension   5. Hyperlipidemia, unspecified hyperlipidemia type   6. PAD (peripheral artery disease) (HCC)   7. Leg edema, left      PLAN:  In order of problems listed above:  1. Chronic atrial fibrillation: Not on any systemic anticoagulation due to her advanced age and bleeding risk.  Heart rate very well controlled  2. Left leg edema: This occurred after her knee surgery in January.  It likely will self resolve in the future.  I did recommend increase Lasix to 80 mg a.m. and 40 mg p.m. as a trial to see if it will improve the swelling.  She will need a basic metabolic panel next week  3. Chronic diastolic heart failure: Mostly appears to be euvolemic.  Left lower extremity swelling likely related to knee surgery in January  4. Hypertension: Blood pressure stable  5. Hyperlipidemia: She is not on statin due to intolerance.  At this point, statin is unlikely to benefit her    Medication Adjustments/Labs and Tests Ordered: Current medicines are reviewed at  length with the patient today.  Concerns regarding medicines are outlined above.  Medication changes, Labs and Tests ordered today are listed in the Patient Instructions below. Patient Instructions  Medication Instructions:  Take Lasix 80 Mg in the mornings and 40 Mg in the evenings  If you need a refill on your cardiac medications before your next appointment, please call your pharmacy.   Lab work: You will need to have labs (blood work) drawn in 1 week:  BMET   If you have labs (blood work) drawn today and your tests are completely normal, you will receive your results only by: Marland Kitchen MyChart Message (if you have MyChart) OR . A paper copy in the mail If you have any lab test that is abnormal or we need to change your treatment, we will call you to review the results.  Testing/Procedures: NONE  Follow-Up: . Your physician recommends that you KEEP scheduled follow-up appointment with Dr. Ellyn Hack   Any Other Special Instructions Will Be Listed Below (If Applicable). Remember to take your Carvedilol (Coreg) 18.75 Mg (1.5 tablets) by mouth 2 times a day.       Hilbert Corrigan, [place]  09/01/2018 [address], [place], [place]  [address] Phone: [phone]; Fax: [phone]

## 2018-08-30 NOTE — Patient Instructions (Addendum)
Medication Instructions:  Take Lasix 80 Mg in the mornings and 40 Mg in the evenings  If you need a refill on your cardiac medications before your next appointment, please call your pharmacy.   Lab work: You will need to have labs (blood work) drawn in 1 week:  BMET   If you have labs (blood work) drawn today and your tests are completely normal, you will receive your results only by: Marland Kitchen MyChart Message (if you have MyChart) OR . A paper copy in the mail If you have any lab test that is abnormal or we need to change your treatment, we will call you to review the results.  Testing/Procedures: NONE  Follow-Up: . Your physician recommends that you KEEP scheduled follow-up appointment with Dr. Ellyn Hack   Any Other Special Instructions Will Be Listed Below (If Applicable). Remember to take your Carvedilol (Coreg) 18.75 Mg (1.5 tablets) by mouth 2 times a day.

## 2018-09-01 ENCOUNTER — Encounter: Payer: Self-pay | Admitting: Physician Assistant

## 2018-09-02 DIAGNOSIS — D649 Anemia, unspecified: Secondary | ICD-10-CM | POA: Diagnosis not present

## 2018-09-02 DIAGNOSIS — I2510 Atherosclerotic heart disease of native coronary artery without angina pectoris: Secondary | ICD-10-CM | POA: Diagnosis not present

## 2018-09-02 DIAGNOSIS — Z96652 Presence of left artificial knee joint: Secondary | ICD-10-CM | POA: Diagnosis not present

## 2018-09-02 DIAGNOSIS — M810 Age-related osteoporosis without current pathological fracture: Secondary | ICD-10-CM | POA: Diagnosis not present

## 2018-09-02 DIAGNOSIS — I739 Peripheral vascular disease, unspecified: Secondary | ICD-10-CM | POA: Diagnosis not present

## 2018-09-02 DIAGNOSIS — S31000D Unspecified open wound of lower back and pelvis without penetration into retroperitoneum, subsequent encounter: Secondary | ICD-10-CM | POA: Diagnosis not present

## 2018-09-02 DIAGNOSIS — Z471 Aftercare following joint replacement surgery: Secondary | ICD-10-CM | POA: Diagnosis not present

## 2018-09-02 DIAGNOSIS — I5032 Chronic diastolic (congestive) heart failure: Secondary | ICD-10-CM | POA: Diagnosis not present

## 2018-09-02 DIAGNOSIS — I110 Hypertensive heart disease with heart failure: Secondary | ICD-10-CM | POA: Diagnosis not present

## 2018-09-02 DIAGNOSIS — I4891 Unspecified atrial fibrillation: Secondary | ICD-10-CM | POA: Diagnosis not present

## 2018-09-03 DIAGNOSIS — I739 Peripheral vascular disease, unspecified: Secondary | ICD-10-CM | POA: Diagnosis not present

## 2018-09-03 DIAGNOSIS — Z96652 Presence of left artificial knee joint: Secondary | ICD-10-CM | POA: Diagnosis not present

## 2018-09-03 DIAGNOSIS — I4891 Unspecified atrial fibrillation: Secondary | ICD-10-CM | POA: Diagnosis not present

## 2018-09-03 DIAGNOSIS — D649 Anemia, unspecified: Secondary | ICD-10-CM | POA: Diagnosis not present

## 2018-09-03 DIAGNOSIS — M810 Age-related osteoporosis without current pathological fracture: Secondary | ICD-10-CM | POA: Diagnosis not present

## 2018-09-03 DIAGNOSIS — I110 Hypertensive heart disease with heart failure: Secondary | ICD-10-CM | POA: Diagnosis not present

## 2018-09-03 DIAGNOSIS — I5032 Chronic diastolic (congestive) heart failure: Secondary | ICD-10-CM | POA: Diagnosis not present

## 2018-09-03 DIAGNOSIS — I2510 Atherosclerotic heart disease of native coronary artery without angina pectoris: Secondary | ICD-10-CM | POA: Diagnosis not present

## 2018-09-03 DIAGNOSIS — Z471 Aftercare following joint replacement surgery: Secondary | ICD-10-CM | POA: Diagnosis not present

## 2018-09-04 DIAGNOSIS — M810 Age-related osteoporosis without current pathological fracture: Secondary | ICD-10-CM | POA: Diagnosis not present

## 2018-09-04 DIAGNOSIS — D649 Anemia, unspecified: Secondary | ICD-10-CM | POA: Diagnosis not present

## 2018-09-04 DIAGNOSIS — I739 Peripheral vascular disease, unspecified: Secondary | ICD-10-CM | POA: Diagnosis not present

## 2018-09-04 DIAGNOSIS — I2510 Atherosclerotic heart disease of native coronary artery without angina pectoris: Secondary | ICD-10-CM | POA: Diagnosis not present

## 2018-09-04 DIAGNOSIS — Z96652 Presence of left artificial knee joint: Secondary | ICD-10-CM | POA: Diagnosis not present

## 2018-09-04 DIAGNOSIS — Z471 Aftercare following joint replacement surgery: Secondary | ICD-10-CM | POA: Diagnosis not present

## 2018-09-04 DIAGNOSIS — I110 Hypertensive heart disease with heart failure: Secondary | ICD-10-CM | POA: Diagnosis not present

## 2018-09-04 DIAGNOSIS — I4891 Unspecified atrial fibrillation: Secondary | ICD-10-CM | POA: Diagnosis not present

## 2018-09-04 DIAGNOSIS — I5032 Chronic diastolic (congestive) heart failure: Secondary | ICD-10-CM | POA: Diagnosis not present

## 2018-09-05 DIAGNOSIS — Z79899 Other long term (current) drug therapy: Secondary | ICD-10-CM | POA: Diagnosis not present

## 2018-09-05 LAB — BASIC METABOLIC PANEL
BUN / CREAT RATIO: 23 (ref 12–28)
BUN: 19 mg/dL (ref 10–36)
CO2: 26 mmol/L (ref 20–29)
Calcium: 9.3 mg/dL (ref 8.7–10.3)
Chloride: 106 mmol/L (ref 96–106)
Creatinine, Ser: 0.83 mg/dL (ref 0.57–1.00)
GFR calc Af Amer: 72 mL/min/{1.73_m2} (ref >59)
GFR calc non Af Amer: 62 mL/min/{1.73_m2} (ref >59)
Glucose: 164 mg/dL — ABNORMAL HIGH (ref 65–99)
Potassium: 3.5 mmol/L (ref 3.5–5.2)
Sodium: 140 mmol/L (ref 134–144)

## 2018-09-06 DIAGNOSIS — I739 Peripheral vascular disease, unspecified: Secondary | ICD-10-CM | POA: Diagnosis not present

## 2018-09-06 DIAGNOSIS — I110 Hypertensive heart disease with heart failure: Secondary | ICD-10-CM | POA: Diagnosis not present

## 2018-09-06 DIAGNOSIS — D649 Anemia, unspecified: Secondary | ICD-10-CM | POA: Diagnosis not present

## 2018-09-06 DIAGNOSIS — Z96652 Presence of left artificial knee joint: Secondary | ICD-10-CM | POA: Diagnosis not present

## 2018-09-06 DIAGNOSIS — I2510 Atherosclerotic heart disease of native coronary artery without angina pectoris: Secondary | ICD-10-CM | POA: Diagnosis not present

## 2018-09-06 DIAGNOSIS — I4891 Unspecified atrial fibrillation: Secondary | ICD-10-CM | POA: Diagnosis not present

## 2018-09-06 DIAGNOSIS — Z471 Aftercare following joint replacement surgery: Secondary | ICD-10-CM | POA: Diagnosis not present

## 2018-09-06 DIAGNOSIS — I5032 Chronic diastolic (congestive) heart failure: Secondary | ICD-10-CM | POA: Diagnosis not present

## 2018-09-06 DIAGNOSIS — M810 Age-related osteoporosis without current pathological fracture: Secondary | ICD-10-CM | POA: Diagnosis not present

## 2018-09-09 ENCOUNTER — Telehealth (INDEPENDENT_AMBULATORY_CARE_PROVIDER_SITE_OTHER): Payer: Self-pay

## 2018-09-09 ENCOUNTER — Telehealth: Payer: Self-pay

## 2018-09-09 NOTE — Telephone Encounter (Addendum)
Left voice message for the patient to cal back for lab reults

## 2018-09-09 NOTE — Progress Notes (Signed)
Tried calling the patient back telephone line is busy.

## 2018-09-09 NOTE — Telephone Encounter (Signed)
Tried calling back on home telephone the line is busy. Will try back later

## 2018-09-09 NOTE — Telephone Encounter (Signed)
Patient returning call for results 

## 2018-09-09 NOTE — Telephone Encounter (Signed)
Called patient and asked the screening questions.  Do you have now or have you had in the past 7 days a fever and/or chills? NO  Do you have now or have you had in the past 7 days a cough? NO  Do you have now or have you had in the last 7 days nausea, vomiting or abdominal pain? NO  Have you been exposed to anyone who has tested positive for COVID-19? NO  Have you or anyone who lives with you traveled within the last month? NO 

## 2018-09-09 NOTE — Progress Notes (Signed)
Left voice message for the patient to call back for lab resutls.

## 2018-09-10 ENCOUNTER — Ambulatory Visit (INDEPENDENT_AMBULATORY_CARE_PROVIDER_SITE_OTHER): Payer: Medicare HMO | Admitting: Orthopaedic Surgery

## 2018-09-10 NOTE — Progress Notes (Signed)
The patient has been notified of the result and verbalized understanding.  All questions (if any) were answered. [name], [place] 09/10/2018 10:33 AM

## 2018-09-20 ENCOUNTER — Telehealth: Payer: Self-pay | Admitting: *Deleted

## 2018-09-20 NOTE — Telephone Encounter (Signed)
She is now > 6 months from peripheral stent -- we can stop Plavix. -- I am pretty sure that this is not the culprit.  This leaves her unprotected for Afib.  Will have to accept the CVA risks.  Glenetta Hew, MD

## 2018-09-20 NOTE — Telephone Encounter (Signed)
SPOKE TO PATIENT      Primary Cardiologist:  Glenetta Hew, MD   Patient contacted.  History reviewed.  No symptoms to suggest any unstable cardiac conditions.  Based on discussion, with current pandemic situation, we will be postponing this appointment for Sara [name] with a plan for f/u in  6 to 12 weeks wks or sooner if feasible/necessary.  If symptoms change, she has been instructed to contact our office.   Routing to C19 CANCEL pool for tracking  Ines Bloomer  09/20/2018 4:55 PM         .

## 2018-09-20 NOTE — Telephone Encounter (Signed)
Spoke with patient about cancelling/postpone appt 4/6 due to covid  Protocol. Patient wanted doctor to know . She thinks she is having an issue with clopidogrel.  She states since taking  Medication she is short of breathe and anxious.  RN informed her that the medication does not give her give symptoms like those. She  states "it is the medicine it has been like that since I started it." RN  informed patient to continue, will send to dr harding for  His opinion.

## 2018-09-23 ENCOUNTER — Telehealth: Payer: Medicare HMO | Admitting: Cardiology

## 2018-09-23 ENCOUNTER — Ambulatory Visit: Payer: Medicare HMO | Admitting: Cardiology

## 2018-09-24 NOTE — Telephone Encounter (Signed)
Spoke with patient . She staees she will continue with plavix until next appointment and discuss with dr harding at that time

## 2018-09-25 ENCOUNTER — Telehealth (INDEPENDENT_AMBULATORY_CARE_PROVIDER_SITE_OTHER): Payer: Self-pay | Admitting: Radiology

## 2018-09-25 DIAGNOSIS — I739 Peripheral vascular disease, unspecified: Secondary | ICD-10-CM | POA: Diagnosis not present

## 2018-09-25 DIAGNOSIS — I2510 Atherosclerotic heart disease of native coronary artery without angina pectoris: Secondary | ICD-10-CM | POA: Diagnosis not present

## 2018-09-25 DIAGNOSIS — Z96652 Presence of left artificial knee joint: Secondary | ICD-10-CM | POA: Diagnosis not present

## 2018-09-25 DIAGNOSIS — I5032 Chronic diastolic (congestive) heart failure: Secondary | ICD-10-CM | POA: Diagnosis not present

## 2018-09-25 DIAGNOSIS — I110 Hypertensive heart disease with heart failure: Secondary | ICD-10-CM | POA: Diagnosis not present

## 2018-09-25 DIAGNOSIS — M810 Age-related osteoporosis without current pathological fracture: Secondary | ICD-10-CM | POA: Diagnosis not present

## 2018-09-25 DIAGNOSIS — D649 Anemia, unspecified: Secondary | ICD-10-CM | POA: Diagnosis not present

## 2018-09-25 DIAGNOSIS — Z471 Aftercare following joint replacement surgery: Secondary | ICD-10-CM | POA: Diagnosis not present

## 2018-09-25 DIAGNOSIS — I4891 Unspecified atrial fibrillation: Secondary | ICD-10-CM | POA: Diagnosis not present

## 2018-09-25 NOTE — Telephone Encounter (Signed)
Sara [name] from [facility] PT wanted to inform Dr. Lorin Mercy, patient had last virtual visit today. Requesting extending virtual visits and verbal order for recertification visit next week.  Also during virtual visit patient mentioned having increased acid reflux, pharmacy advised Nexium. However patient is taking asprin which showed [place] as a major drug interaction with medications together. Is Dr. Lorin Mercy ok with this or advise anything different.

## 2018-09-26 NOTE — Telephone Encounter (Signed)
Please see below and advise.

## 2018-09-26 NOTE — Telephone Encounter (Signed)
Ok more PT. They will need pt to talk with PCP about reflux tx thanks

## 2018-09-26 NOTE — Telephone Encounter (Signed)
I called Daleen Snook and advised. She will have patient call her PCP to discuss medication.

## 2018-09-30 DIAGNOSIS — Z471 Aftercare following joint replacement surgery: Secondary | ICD-10-CM | POA: Diagnosis not present

## 2018-09-30 DIAGNOSIS — I2510 Atherosclerotic heart disease of native coronary artery without angina pectoris: Secondary | ICD-10-CM | POA: Diagnosis not present

## 2018-09-30 DIAGNOSIS — Z96652 Presence of left artificial knee joint: Secondary | ICD-10-CM | POA: Diagnosis not present

## 2018-09-30 DIAGNOSIS — I110 Hypertensive heart disease with heart failure: Secondary | ICD-10-CM | POA: Diagnosis not present

## 2018-09-30 DIAGNOSIS — I739 Peripheral vascular disease, unspecified: Secondary | ICD-10-CM | POA: Diagnosis not present

## 2018-09-30 DIAGNOSIS — D649 Anemia, unspecified: Secondary | ICD-10-CM | POA: Diagnosis not present

## 2018-09-30 DIAGNOSIS — M810 Age-related osteoporosis without current pathological fracture: Secondary | ICD-10-CM | POA: Diagnosis not present

## 2018-09-30 DIAGNOSIS — I5032 Chronic diastolic (congestive) heart failure: Secondary | ICD-10-CM | POA: Diagnosis not present

## 2018-09-30 DIAGNOSIS — I4891 Unspecified atrial fibrillation: Secondary | ICD-10-CM | POA: Diagnosis not present

## 2018-10-01 ENCOUNTER — Telehealth (INDEPENDENT_AMBULATORY_CARE_PROVIDER_SITE_OTHER): Payer: Self-pay

## 2018-10-01 NOTE — Telephone Encounter (Signed)
Please advise 

## 2018-10-01 NOTE — Telephone Encounter (Signed)
Patient states she would like to Cx her appt. She does not want to come out right now due to COVID-19. States she is still having swelling and legs and would like to know what she needs to do. Would like a CB.    CB [phone]

## 2018-10-03 ENCOUNTER — Telehealth (INDEPENDENT_AMBULATORY_CARE_PROVIDER_SITE_OTHER): Payer: Self-pay | Admitting: Radiology

## 2018-10-03 NOTE — Telephone Encounter (Signed)
Per note in chart from Dr. Lorin Mercy, ok to continue PT.

## 2018-10-03 NOTE — Telephone Encounter (Signed)
I called Sara [name] and advised.

## 2018-10-03 NOTE — Telephone Encounter (Signed)
Sara [name] needs verbal orders cont PT, 1 week x 9 weeks, with some video visits and then resuming in house once COVID-19 decreases.

## 2018-10-04 ENCOUNTER — Ambulatory Visit (INDEPENDENT_AMBULATORY_CARE_PROVIDER_SITE_OTHER): Payer: Medicare HMO | Admitting: Orthopaedic Surgery

## 2018-10-04 NOTE — Telephone Encounter (Signed)
Called for 3 days , finally reached her, she still has leg edema on TKA side, other none. Still using walker, doing therapy, doppler was neg.

## 2018-10-08 ENCOUNTER — Encounter (HOSPITAL_COMMUNITY): Payer: Medicare HMO

## 2018-10-10 DIAGNOSIS — I4891 Unspecified atrial fibrillation: Secondary | ICD-10-CM | POA: Diagnosis not present

## 2018-10-10 DIAGNOSIS — I739 Peripheral vascular disease, unspecified: Secondary | ICD-10-CM | POA: Diagnosis not present

## 2018-10-10 DIAGNOSIS — Z96652 Presence of left artificial knee joint: Secondary | ICD-10-CM | POA: Diagnosis not present

## 2018-10-10 DIAGNOSIS — D649 Anemia, unspecified: Secondary | ICD-10-CM | POA: Diagnosis not present

## 2018-10-10 DIAGNOSIS — I2510 Atherosclerotic heart disease of native coronary artery without angina pectoris: Secondary | ICD-10-CM | POA: Diagnosis not present

## 2018-10-10 DIAGNOSIS — M810 Age-related osteoporosis without current pathological fracture: Secondary | ICD-10-CM | POA: Diagnosis not present

## 2018-10-10 DIAGNOSIS — I110 Hypertensive heart disease with heart failure: Secondary | ICD-10-CM | POA: Diagnosis not present

## 2018-10-10 DIAGNOSIS — I5032 Chronic diastolic (congestive) heart failure: Secondary | ICD-10-CM | POA: Diagnosis not present

## 2018-10-10 DIAGNOSIS — Z471 Aftercare following joint replacement surgery: Secondary | ICD-10-CM | POA: Diagnosis not present

## 2018-10-24 DIAGNOSIS — M810 Age-related osteoporosis without current pathological fracture: Secondary | ICD-10-CM | POA: Diagnosis not present

## 2018-10-24 DIAGNOSIS — I4891 Unspecified atrial fibrillation: Secondary | ICD-10-CM | POA: Diagnosis not present

## 2018-10-24 DIAGNOSIS — I110 Hypertensive heart disease with heart failure: Secondary | ICD-10-CM | POA: Diagnosis not present

## 2018-10-24 DIAGNOSIS — I739 Peripheral vascular disease, unspecified: Secondary | ICD-10-CM | POA: Diagnosis not present

## 2018-10-24 DIAGNOSIS — D649 Anemia, unspecified: Secondary | ICD-10-CM | POA: Diagnosis not present

## 2018-10-24 DIAGNOSIS — I2510 Atherosclerotic heart disease of native coronary artery without angina pectoris: Secondary | ICD-10-CM | POA: Diagnosis not present

## 2018-10-24 DIAGNOSIS — Z471 Aftercare following joint replacement surgery: Secondary | ICD-10-CM | POA: Diagnosis not present

## 2018-10-24 DIAGNOSIS — Z96652 Presence of left artificial knee joint: Secondary | ICD-10-CM | POA: Diagnosis not present

## 2018-10-24 DIAGNOSIS — I5032 Chronic diastolic (congestive) heart failure: Secondary | ICD-10-CM | POA: Diagnosis not present

## 2018-10-28 DIAGNOSIS — I110 Hypertensive heart disease with heart failure: Secondary | ICD-10-CM | POA: Diagnosis not present

## 2018-10-28 DIAGNOSIS — M810 Age-related osteoporosis without current pathological fracture: Secondary | ICD-10-CM | POA: Diagnosis not present

## 2018-10-28 DIAGNOSIS — I739 Peripheral vascular disease, unspecified: Secondary | ICD-10-CM | POA: Diagnosis not present

## 2018-10-28 DIAGNOSIS — D649 Anemia, unspecified: Secondary | ICD-10-CM | POA: Diagnosis not present

## 2018-10-28 DIAGNOSIS — I2510 Atherosclerotic heart disease of native coronary artery without angina pectoris: Secondary | ICD-10-CM | POA: Diagnosis not present

## 2018-10-28 DIAGNOSIS — I4891 Unspecified atrial fibrillation: Secondary | ICD-10-CM | POA: Diagnosis not present

## 2018-10-28 DIAGNOSIS — Z471 Aftercare following joint replacement surgery: Secondary | ICD-10-CM | POA: Diagnosis not present

## 2018-10-28 DIAGNOSIS — I5032 Chronic diastolic (congestive) heart failure: Secondary | ICD-10-CM | POA: Diagnosis not present

## 2018-10-28 DIAGNOSIS — Z96652 Presence of left artificial knee joint: Secondary | ICD-10-CM | POA: Diagnosis not present

## 2018-11-04 ENCOUNTER — Other Ambulatory Visit (HOSPITAL_COMMUNITY): Payer: Self-pay | Admitting: Cardiovascular Disease

## 2018-11-04 DIAGNOSIS — I739 Peripheral vascular disease, unspecified: Secondary | ICD-10-CM

## 2018-11-06 ENCOUNTER — Telehealth: Payer: Self-pay | Admitting: *Deleted

## 2018-11-06 NOTE — Telephone Encounter (Signed)
Unable to leave a message,no answer/busy.

## 2018-11-08 DIAGNOSIS — Z96652 Presence of left artificial knee joint: Secondary | ICD-10-CM | POA: Diagnosis not present

## 2018-11-08 DIAGNOSIS — M810 Age-related osteoporosis without current pathological fracture: Secondary | ICD-10-CM | POA: Diagnosis not present

## 2018-11-08 DIAGNOSIS — Z471 Aftercare following joint replacement surgery: Secondary | ICD-10-CM | POA: Diagnosis not present

## 2018-11-08 DIAGNOSIS — I4891 Unspecified atrial fibrillation: Secondary | ICD-10-CM | POA: Diagnosis not present

## 2018-11-08 DIAGNOSIS — D649 Anemia, unspecified: Secondary | ICD-10-CM | POA: Diagnosis not present

## 2018-11-08 DIAGNOSIS — I2510 Atherosclerotic heart disease of native coronary artery without angina pectoris: Secondary | ICD-10-CM | POA: Diagnosis not present

## 2018-11-08 DIAGNOSIS — I5032 Chronic diastolic (congestive) heart failure: Secondary | ICD-10-CM | POA: Diagnosis not present

## 2018-11-08 DIAGNOSIS — I739 Peripheral vascular disease, unspecified: Secondary | ICD-10-CM | POA: Diagnosis not present

## 2018-11-08 DIAGNOSIS — I110 Hypertensive heart disease with heart failure: Secondary | ICD-10-CM | POA: Diagnosis not present

## 2018-11-15 DIAGNOSIS — I739 Peripheral vascular disease, unspecified: Secondary | ICD-10-CM | POA: Diagnosis not present

## 2018-11-15 DIAGNOSIS — D649 Anemia, unspecified: Secondary | ICD-10-CM | POA: Diagnosis not present

## 2018-11-15 DIAGNOSIS — Z471 Aftercare following joint replacement surgery: Secondary | ICD-10-CM | POA: Diagnosis not present

## 2018-11-15 DIAGNOSIS — I5032 Chronic diastolic (congestive) heart failure: Secondary | ICD-10-CM | POA: Diagnosis not present

## 2018-11-15 DIAGNOSIS — Z96652 Presence of left artificial knee joint: Secondary | ICD-10-CM | POA: Diagnosis not present

## 2018-11-15 DIAGNOSIS — I4891 Unspecified atrial fibrillation: Secondary | ICD-10-CM | POA: Diagnosis not present

## 2018-11-15 DIAGNOSIS — I2510 Atherosclerotic heart disease of native coronary artery without angina pectoris: Secondary | ICD-10-CM | POA: Diagnosis not present

## 2018-11-15 DIAGNOSIS — I110 Hypertensive heart disease with heart failure: Secondary | ICD-10-CM | POA: Diagnosis not present

## 2018-11-15 DIAGNOSIS — M810 Age-related osteoporosis without current pathological fracture: Secondary | ICD-10-CM | POA: Diagnosis not present

## 2018-11-19 DIAGNOSIS — I5032 Chronic diastolic (congestive) heart failure: Secondary | ICD-10-CM | POA: Diagnosis not present

## 2018-11-19 DIAGNOSIS — I739 Peripheral vascular disease, unspecified: Secondary | ICD-10-CM | POA: Diagnosis not present

## 2018-11-19 DIAGNOSIS — Z471 Aftercare following joint replacement surgery: Secondary | ICD-10-CM | POA: Diagnosis not present

## 2018-11-19 DIAGNOSIS — Z96652 Presence of left artificial knee joint: Secondary | ICD-10-CM | POA: Diagnosis not present

## 2018-11-19 DIAGNOSIS — D649 Anemia, unspecified: Secondary | ICD-10-CM | POA: Diagnosis not present

## 2018-11-19 DIAGNOSIS — I4891 Unspecified atrial fibrillation: Secondary | ICD-10-CM | POA: Diagnosis not present

## 2018-11-19 DIAGNOSIS — I110 Hypertensive heart disease with heart failure: Secondary | ICD-10-CM | POA: Diagnosis not present

## 2018-11-19 DIAGNOSIS — M810 Age-related osteoporosis without current pathological fracture: Secondary | ICD-10-CM | POA: Diagnosis not present

## 2018-11-19 DIAGNOSIS — I2510 Atherosclerotic heart disease of native coronary artery without angina pectoris: Secondary | ICD-10-CM | POA: Diagnosis not present

## 2018-11-22 ENCOUNTER — Ambulatory Visit (HOSPITAL_COMMUNITY)
Admission: RE | Admit: 2018-11-22 | Discharge: 2018-11-22 | Disposition: A | Discharge status: Home / Self Care | Payer: Medicare HMO | Source: Ambulatory Visit | Attending: Cardiovascular Disease | Admitting: Cardiovascular Disease

## 2018-11-22 ENCOUNTER — Other Ambulatory Visit (HOSPITAL_COMMUNITY): Payer: Self-pay | Admitting: Cardiovascular Disease

## 2018-11-22 ENCOUNTER — Other Ambulatory Visit: Payer: Self-pay

## 2018-11-22 DIAGNOSIS — I739 Peripheral vascular disease, unspecified: Secondary | ICD-10-CM | POA: Insufficient documentation

## 2018-11-25 DIAGNOSIS — I5032 Chronic diastolic (congestive) heart failure: Secondary | ICD-10-CM | POA: Diagnosis not present

## 2018-11-25 DIAGNOSIS — M810 Age-related osteoporosis without current pathological fracture: Secondary | ICD-10-CM | POA: Diagnosis not present

## 2018-11-25 DIAGNOSIS — D649 Anemia, unspecified: Secondary | ICD-10-CM | POA: Diagnosis not present

## 2018-11-25 DIAGNOSIS — I110 Hypertensive heart disease with heart failure: Secondary | ICD-10-CM | POA: Diagnosis not present

## 2018-11-25 DIAGNOSIS — I2510 Atherosclerotic heart disease of native coronary artery without angina pectoris: Secondary | ICD-10-CM | POA: Diagnosis not present

## 2018-11-25 DIAGNOSIS — Z96652 Presence of left artificial knee joint: Secondary | ICD-10-CM | POA: Diagnosis not present

## 2018-11-25 DIAGNOSIS — I4891 Unspecified atrial fibrillation: Secondary | ICD-10-CM | POA: Diagnosis not present

## 2018-11-25 DIAGNOSIS — I739 Peripheral vascular disease, unspecified: Secondary | ICD-10-CM | POA: Diagnosis not present

## 2018-11-25 DIAGNOSIS — Z471 Aftercare following joint replacement surgery: Secondary | ICD-10-CM | POA: Diagnosis not present

## 2018-12-02 DIAGNOSIS — I4891 Unspecified atrial fibrillation: Secondary | ICD-10-CM | POA: Diagnosis not present

## 2018-12-04 ENCOUNTER — Other Ambulatory Visit: Payer: Self-pay

## 2018-12-04 ENCOUNTER — Encounter: Payer: Self-pay | Admitting: Cardiovascular Disease

## 2018-12-04 ENCOUNTER — Ambulatory Visit (INDEPENDENT_AMBULATORY_CARE_PROVIDER_SITE_OTHER): Payer: Medicare HMO | Admitting: Cardiovascular Disease

## 2018-12-04 DIAGNOSIS — I739 Peripheral vascular disease, unspecified: Secondary | ICD-10-CM | POA: Diagnosis not present

## 2018-12-04 NOTE — Progress Notes (Signed)
12/04/2018 Sara [name]   03-07-1926  [id number]  Primary Physician Townsend Roger, MD Primary Cardiologist: Lorretta Harp MD FACP, [place], [place], [place]  HPI:  Sara [name] is a 85 y.o.  moderately overweight widowed Caucasian female mother of 65, grandmother one grandchild who I last saw in the office 04/12/2018. She was referred by Dr. Glenetta Hew for peripheral vascular evaluation because of an abnormal lower extremity arterial Doppler study performed in preoperative evaluation prior to elective left total knee replacement by Dr. Lorin Mercy . She has a history of hypertension and chronic atrial fibrillation. She has been wheelchair-bound for 2 months because of pain in her left knee. Dopplers performed 01/29/2018 revealed a right ABI 0.76 and left 0.57 with an occluded left distal SFA and popliteal artery. She did complain of some claudication prior to this. There is no evidence of critical limb ischemia.  I performed peripheral angiography and intervention on her 03/18/2018 with PTA and drug-eluting stenting of left SFA CTO with two-vessel runoff.  Post procedure Dopplers performed 03/26/2018 were markedly improved.  Claudication has improved as well.  She does have some mild lower extremity edema as result of revascularization of a CTO.  We will keep her on dual antiplatelet therapy for 3 months after which she can hold her Plavix for elective left total knee replacement.  She ultimately underwent left total knee replacement by Dr. Inda Merlin 07/08/2018 with an excellent result.  He is just finishing physical therapy.  Doppler studies performed 11/22/2018 revealed a right ABI 0.81 and a left of 1.07 with a widely patent left SFA stent.  She remains on aspirin and Plavix.   No outpatient medications have been marked as taking for the 12/04/18 encounter (Office Visit) with Lorretta Harp, MD.     Allergies  Allergen Reactions  . Amiodarone Shortness Of Breath and Other (See Comments)    Stopped in Feb 2017 secondary to pulmonary complications  . Aliskiren Other (See Comments) and Nausea Only    unknown  . Amlodipine Besylate-Valsartan Other (See Comments) and Nausea Only    Unclear  . Bystolic [Nebivolol Hcl] Other (See Comments)    unknown  . Clonidine Derivatives Other (See Comments)    unknown  . Dronedarone Nausea And Vomiting and Nausea Only  . Hctz [Hydrochlorothiazide] Other (See Comments)    Currently taking without problem; question if this has to do with hypokalemia  . Hydralazine Other (See Comments)    Also tolerated, but had orthostatic changes on standing medication  . Lisinopril Other (See Comments) and Nausea Only    unknown  . Nebivolol Nausea Only  . Olmesartan Nausea Only and Other (See Comments)    Unclear of the intolerance  . Oxycodone     Patient states decreased heart rate, decreased blood pressure. List as allergy.  . Rythmol [Propafenone] Other (See Comments)    unknown  . Statins Other (See Comments) and Nausea Only    unknown  . Carvedilol Other (See Comments) and Nausea And Vomiting    fatigue  . Prednisone Nausea And Vomiting and Other (See Comments)    Jittery     Social History   Socioeconomic History  . Marital status: Widowed    Spouse name: Not on file  . Number of children: 1  . Years of education: Not on file  . Highest education level: Not on file  Occupational History    Employer: RETIRED   Social Needs  . Financial resource strain:  Not on file  . Food insecurity    Worry: Not on file    Inability: Not on file  . Transportation needs    Medical: Not on file    Non-medical: Not on file  Tobacco Use  . Smoking status: Never Smoker  . Smokeless tobacco: Never Used  Substance and Sexual Activity  . Alcohol use: No  . Drug use: No  . Sexual activity: Not on file  Lifestyle  . Physical activity    Days per week: Not on file    Minutes per session: Not on file  . Stress: Not on file  Relationships  .  Social Herbalist on phone: Not on file    Gets together: Not on file    Attends religious service: Not on file    Active member of club or organization: Not on file    Attends meetings of clubs or organizations: Not on file    Relationship status: Not on file  . Intimate partner violence    Fear of current or ex partner: Not on file    Emotionally abused: Not on file    Physically abused: Not on file    Forced sexual activity: Not on file  Other Topics Concern  . Not on file  Social History Narrative   She is a widowed mother of one and grandmother of one.  She does not exercise.  She does not smoke and does not drink alcohol.     Review of Systems: General: negative for chills, fever, night sweats or weight changes.  Cardiovascular: negative for chest pain, dyspnea on exertion, edema, orthopnea, palpitations, paroxysmal nocturnal dyspnea or shortness of breath Dermatological: negative for rash Respiratory: negative for cough or wheezing Urologic: negative for hematuria Abdominal: negative for nausea, vomiting, diarrhea, bright red blood per rectum, melena, or hematemesis Neurologic: negative for visual changes, syncope, or dizziness All other systems reviewed and are otherwise negative except as noted above.    Blood pressure 118/74, pulse 76, temperature 98.2 F (36.8 C), height 5' (1.524 m), weight 144 lb (65.3 kg).  General appearance: alert and no distress Neck: no adenopathy, no carotid bruit, no JVD, supple, symmetrical, trachea midline and thyroid not enlarged, symmetric, no tenderness/mass/nodules Lungs: clear to auscultation bilaterally Heart: regular rate and rhythm, S1, S2 normal, no murmur, click, rub or gallop Extremities: extremities normal, atraumatic, no cyanosis or edema Pulses: 2+ and symmetric Skin: Skin color, texture, turgor normal. No rashes or lesions Neurologic: Alert and oriented X 3, normal strength and tone. Normal symmetric reflexes.  Normal coordination and gait  EKG not performed today  ASSESSMENT AND PLAN:   Peripheral arterial disease ([facility]) History of peripheral arterial disease status post left SFA CTO PTA and stenting using overlapping drug-eluting stents with two-vessel runoff 03/18/2018.  She subsequently underwent left total knee replacement by Dr. Lorin Mercy 07/08/2018 and is just finishing physical therapy and is able to walk with a walker.  Her most recent lower extremity arterial Doppler studies performed 11/22/2018 revealed a right ABI of 0.81 and a left of 1.07 with a widely patent stent.      Lorretta Harp MD FACP,FACC,FAHA, [facility] 12/04/2018 4:24 PM

## 2018-12-04 NOTE — Assessment & Plan Note (Signed)
History of peripheral arterial disease status post left SFA CTO PTA and stenting using overlapping drug-eluting stents with two-vessel runoff 03/18/2018.  She subsequently underwent left total knee replacement by Dr. Lorin Mercy 07/08/2018 and is just finishing physical therapy and is able to walk with a walker.  Her most recent lower extremity arterial Doppler studies performed 11/22/2018 revealed a right ABI of 0.81 and a left of 1.07 with a widely patent stent.

## 2018-12-04 NOTE — Patient Instructions (Signed)
Medication Instructions:  Your physician recommends that you continue on your current medications as directed. Please refer to the Current Medication list given to you today.  If you need a refill on your cardiac medications before your next appointment, please call your pharmacy.   Lab work: NONE If you have labs (blood work) drawn today and your tests are completely normal, you will receive your results only by: Marland Kitchen MyChart Message (if you have MyChart) OR . A paper copy in the mail If you have any lab test that is abnormal or we need to change your treatment, we will call you to review the results.  Testing/Procedures: Your physician has requested that you have a lower or upper extremity arterial duplex. This test is an ultrasound of the arteries in the legs or arms. It looks at arterial blood flow in the legs and arms. Allow one hour for Lower and Upper Arterial scans. There are no restrictions or special instructions. TO BE SCHEDULED FOR June 2021.  Your physician has requested that you have an ankle brachial index (ABI). During this test an ultrasound and blood pressure cuff are used to evaluate the arteries that supply the arms and legs with blood. Allow thirty minutes for this exam. There are no restrictions or special instructions. TO BE SCHEDULED FOR June 2021.   Follow-Up: At [facility], you and your health needs are our priority.  As part of our continuing mission to provide you with exceptional heart care, we have created designated Provider Care Teams.  These Care Teams include your primary Cardiologist (physician) and Advanced Practice Providers (APPs -  Physician Assistants and Nurse Practitioners) who all work together to provide you with the care you need, when you need it. You will need a follow up appointment in 12 months.  Please call our office 2 months in advance to schedule this appointment.  PLEASE HAVE YOUR ULTRASOUND STUDIES DONE BEFORE YOUR FOLLOW UP APPOINTMENT  WITH DR. Gwenlyn Found.

## 2018-12-05 ENCOUNTER — Other Ambulatory Visit: Payer: Self-pay

## 2018-12-05 NOTE — Progress Notes (Signed)
Notes recorded by Lorretta Harp, MD on 11/23/2018 at 11:41 AM EDT  No change from prior study. Repeat in 12 months. Dx:  PVD (peripheral vascular disease)

## 2018-12-31 ENCOUNTER — Other Ambulatory Visit: Payer: Self-pay | Admitting: *Deleted

## 2018-12-31 ENCOUNTER — Other Ambulatory Visit: Payer: Self-pay | Admitting: Cardiology

## 2018-12-31 NOTE — Telephone Encounter (Signed)
Left message for patient to return call to verify allergies.

## 2018-12-31 NOTE — Telephone Encounter (Signed)
New Message   *STAT* If patient is at the pharmacy, call can be transferred to refill team.   1. Which medications need to be refilled? (please list name of each medication and dose if known)  carvedilol (COREG) 12.5 MG tablet(Expired)  2. Which pharmacy/location (including street and city if local pharmacy) is medication to be sent to? [place], [place]  3. Do they need a 30 day or 90 day supply?  90 day supply

## 2019-01-01 ENCOUNTER — Other Ambulatory Visit: Payer: Self-pay | Admitting: Cardiology

## 2019-01-01 MED ORDER — CARVEDILOL 12.5 MG PO TABS: 18.7500 mg | ORAL_TABLET | Freq: Two times a day (BID) | ORAL | 0 refills | Status: DC

## 2019-01-01 NOTE — Addendum Note (Signed)
Addended by: Ashok Norris on: 01/01/2019 01:25 PM   Modules accepted: Orders

## 2019-01-01 NOTE — Telephone Encounter (Signed)
Patient called back please see additional encounter. Medication refilled.

## 2019-01-01 NOTE — Telephone Encounter (Signed)
°*  STAT* If patient is at the pharmacy, call can be transferred to refill team.   1. Which medications need to be refilled? (please list name of each medication and dose if known) carvedilol 12.5  2. Which pharmacy/location (including street and city if local pharmacy) is medication to be sent to? [facility]    3. Do they need a 30 day or 90 day supply? [place]

## 2019-01-01 NOTE — Telephone Encounter (Signed)
Called patient to confirm allergy to carvedilol. She states has taken carvedilol for a long time with no issues. Carvedilol 12.5 mg (1 1/2 tablet twice daily) refilled.

## 2019-01-24 DIAGNOSIS — B372 Candidiasis of skin and nail: Secondary | ICD-10-CM | POA: Diagnosis not present

## 2019-03-11 DIAGNOSIS — R21 Rash and other nonspecific skin eruption: Secondary | ICD-10-CM | POA: Diagnosis not present

## 2019-03-28 ENCOUNTER — Other Ambulatory Visit: Payer: Self-pay | Admitting: Cardiology

## 2019-03-28 MED ORDER — CARVEDILOL 12.5 MG PO TABS: 18.7500 mg | ORAL_TABLET | Freq: Two times a day (BID) | ORAL | 2 refills | Status: DC

## 2019-03-28 NOTE — Telephone Encounter (Signed)
 *  STAT* If patient is at the pharmacy, call can be transferred to refill team.   1. Which medications need to be refilled? (please list name of each medication and dose if known) carvedilol (COREG) 12.5 MG tablet  2. Which pharmacy/location (including street and city if local pharmacy) is medication to be sent to? [facility]  3. Do they need a 30 day or 90 day supply? 90  Almost completely out

## 2019-03-31 ENCOUNTER — Other Ambulatory Visit: Payer: Self-pay | Admitting: Cardiovascular Disease

## 2019-03-31 ENCOUNTER — Other Ambulatory Visit: Payer: Self-pay

## 2019-03-31 NOTE — Telephone Encounter (Signed)
° ° ° °  Patient is out of medication   1. Which medications need to be refilled? (please list name of each medication and dose if known) carvedilol (COREG) 12.5 MG tablet  2. Which pharmacy/location (including street and city if local pharmacy) is medication to be sent to? [place]  3. Do they need a 30 day or 90 day supply? [place]

## 2019-04-01 MED ORDER — CARVEDILOL 12.5 MG PO TABS: 18.7500 mg | ORAL_TABLET | Freq: Two times a day (BID) | ORAL | 2 refills | Status: DC

## 2019-04-01 NOTE — Telephone Encounter (Signed)
Refill sent.

## 2019-04-07 ENCOUNTER — Encounter: Payer: Self-pay | Admitting: Cardiovascular Disease

## 2019-04-07 DIAGNOSIS — I739 Peripheral vascular disease, unspecified: Secondary | ICD-10-CM | POA: Diagnosis not present

## 2019-04-07 DIAGNOSIS — I4891 Unspecified atrial fibrillation: Secondary | ICD-10-CM | POA: Diagnosis not present

## 2019-04-07 DIAGNOSIS — I5032 Chronic diastolic (congestive) heart failure: Secondary | ICD-10-CM | POA: Diagnosis not present

## 2019-04-07 DIAGNOSIS — Z23 Encounter for immunization: Secondary | ICD-10-CM | POA: Diagnosis not present

## 2019-04-07 DIAGNOSIS — Z0000 Encounter for general adult medical examination without abnormal findings: Secondary | ICD-10-CM | POA: Diagnosis not present

## 2019-04-07 DIAGNOSIS — Z1389 Encounter for screening for other disorder: Secondary | ICD-10-CM | POA: Diagnosis not present

## 2019-04-07 DIAGNOSIS — I10 Essential (primary) hypertension: Secondary | ICD-10-CM | POA: Diagnosis not present

## 2019-04-07 DIAGNOSIS — G2581 Restless legs syndrome: Secondary | ICD-10-CM | POA: Diagnosis not present

## 2019-04-07 DIAGNOSIS — E7800 Pure hypercholesterolemia, unspecified: Secondary | ICD-10-CM | POA: Diagnosis not present

## 2019-05-21 ENCOUNTER — Telehealth: Payer: Self-pay | Admitting: Cardiology

## 2019-05-21 ENCOUNTER — Other Ambulatory Visit: Payer: Self-pay

## 2019-05-21 MED ORDER — FUROSEMIDE 40 MG PO TABS: ORAL_TABLET | ORAL | 0 refills | Status: DC

## 2019-05-21 MED ORDER — POTASSIUM CHLORIDE CRYS ER 20 MEQ PO TBCR: 40.0000 meq | EXTENDED_RELEASE_TABLET | Freq: Two times a day (BID) | ORAL | 0 refills | Status: DC

## 2019-05-21 NOTE — Telephone Encounter (Signed)
°*  STAT* If patient is at the pharmacy, call can be transferred to refill team.   1. Which medications need to be refilled? (please list name of each medication and dose if known)   potassium chloride SA (K-DUR,KLOR-CON) 20 MEQ tablet  furosemide (LASIX) 40 MG tablet 2. Which pharmacy/location (including street and city if local pharmacy) is medication to be sent to? [place], [place]  3. Do they need a 30 day or 90 day supply? 90 day   Patient is out of    potassium chloride SA (K-DUR,KLOR-CON) 20 MEQ tablet     & only has a few tablets of furosemide (LASIX) 40 MG tablet left.

## 2019-05-21 NOTE — Telephone Encounter (Signed)
Medication sent to pharmacy  

## 2019-05-23 ENCOUNTER — Other Ambulatory Visit: Payer: Self-pay | Admitting: Cardiology

## 2019-05-29 ENCOUNTER — Ambulatory Visit: Payer: Medicare HMO | Admitting: Neurology

## 2019-07-15 ENCOUNTER — Ambulatory Visit: Payer: Medicare HMO | Admitting: Neurology

## 2019-07-28 DIAGNOSIS — R21 Rash and other nonspecific skin eruption: Secondary | ICD-10-CM | POA: Diagnosis not present

## 2019-07-28 DIAGNOSIS — J302 Other seasonal allergic rhinitis: Secondary | ICD-10-CM | POA: Diagnosis not present

## 2019-07-28 DIAGNOSIS — I10 Essential (primary) hypertension: Secondary | ICD-10-CM | POA: Diagnosis not present

## 2019-10-27 DIAGNOSIS — I10 Essential (primary) hypertension: Secondary | ICD-10-CM | POA: Diagnosis not present

## 2019-12-04 ENCOUNTER — Encounter (HOSPITAL_COMMUNITY): Payer: Medicare HMO

## 2019-12-04 ENCOUNTER — Other Ambulatory Visit: Payer: Self-pay | Admitting: *Deleted

## 2019-12-04 MED ORDER — FUROSEMIDE 40 MG PO TABS: ORAL_TABLET | ORAL | 1 refills | Status: DC

## 2019-12-04 NOTE — Telephone Encounter (Signed)
Rx has been sent to the pharmacy electronically. ° °

## 2019-12-05 ENCOUNTER — Encounter (HOSPITAL_COMMUNITY): Payer: Self-pay | Admitting: Emergency Medicine

## 2019-12-05 ENCOUNTER — Emergency Department (HOSPITAL_COMMUNITY): Payer: Medicare HMO

## 2019-12-05 ENCOUNTER — Inpatient Hospital Stay (HOSPITAL_COMMUNITY): Payer: Medicare HMO

## 2019-12-05 ENCOUNTER — Other Ambulatory Visit: Payer: Self-pay

## 2019-12-05 ENCOUNTER — Inpatient Hospital Stay (HOSPITAL_COMMUNITY)
Admit: 2019-12-05 | Discharge: 2019-12-12 | DRG: 065 | Disposition: A | Discharge status: Skilled Nursing Facility | Payer: Medicare HMO | Attending: Internal Medicine | Admitting: Internal Medicine

## 2019-12-05 DIAGNOSIS — K922 Gastrointestinal hemorrhage, unspecified: Secondary | ICD-10-CM | POA: Diagnosis not present

## 2019-12-05 DIAGNOSIS — R278 Other lack of coordination: Secondary | ICD-10-CM | POA: Diagnosis not present

## 2019-12-05 DIAGNOSIS — I351 Nonrheumatic aortic (valve) insufficiency: Secondary | ICD-10-CM | POA: Diagnosis not present

## 2019-12-05 DIAGNOSIS — Z8673 Personal history of transient ischemic attack (TIA), and cerebral infarction without residual deficits: Secondary | ICD-10-CM | POA: Insufficient documentation

## 2019-12-05 DIAGNOSIS — I110 Hypertensive heart disease with heart failure: Secondary | ICD-10-CM | POA: Diagnosis not present

## 2019-12-05 DIAGNOSIS — I340 Nonrheumatic mitral (valve) insufficiency: Secondary | ICD-10-CM

## 2019-12-05 DIAGNOSIS — Z9181 History of falling: Secondary | ICD-10-CM

## 2019-12-05 DIAGNOSIS — Z20822 Contact with and (suspected) exposure to covid-19: Secondary | ICD-10-CM | POA: Diagnosis present

## 2019-12-05 DIAGNOSIS — F411 Generalized anxiety disorder: Secondary | ICD-10-CM | POA: Diagnosis present

## 2019-12-05 DIAGNOSIS — Z7401 Bed confinement status: Secondary | ICD-10-CM | POA: Diagnosis not present

## 2019-12-05 DIAGNOSIS — Z79899 Other long term (current) drug therapy: Secondary | ICD-10-CM

## 2019-12-05 DIAGNOSIS — Z8249 Family history of ischemic heart disease and other diseases of the circulatory system: Secondary | ICD-10-CM

## 2019-12-05 DIAGNOSIS — R29704 NIHSS score 4: Secondary | ICD-10-CM | POA: Diagnosis not present

## 2019-12-05 DIAGNOSIS — M1710 Unilateral primary osteoarthritis, unspecified knee: Secondary | ICD-10-CM | POA: Diagnosis present

## 2019-12-05 DIAGNOSIS — G2581 Restless legs syndrome: Secondary | ICD-10-CM | POA: Diagnosis present

## 2019-12-05 DIAGNOSIS — K219 Gastro-esophageal reflux disease without esophagitis: Secondary | ICD-10-CM | POA: Diagnosis not present

## 2019-12-05 DIAGNOSIS — Z96653 Presence of artificial knee joint, bilateral: Secondary | ICD-10-CM | POA: Diagnosis present

## 2019-12-05 DIAGNOSIS — R531 Weakness: Secondary | ICD-10-CM | POA: Diagnosis not present

## 2019-12-05 DIAGNOSIS — I10 Essential (primary) hypertension: Secondary | ICD-10-CM | POA: Diagnosis not present

## 2019-12-05 DIAGNOSIS — E782 Mixed hyperlipidemia: Secondary | ICD-10-CM

## 2019-12-05 DIAGNOSIS — I69351 Hemiplegia and hemiparesis following cerebral infarction affecting right dominant side: Secondary | ICD-10-CM | POA: Diagnosis not present

## 2019-12-05 DIAGNOSIS — I5032 Chronic diastolic (congestive) heart failure: Secondary | ICD-10-CM | POA: Diagnosis not present

## 2019-12-05 DIAGNOSIS — I739 Peripheral vascular disease, unspecified: Secondary | ICD-10-CM | POA: Diagnosis not present

## 2019-12-05 DIAGNOSIS — G8191 Hemiplegia, unspecified affecting right dominant side: Secondary | ICD-10-CM | POA: Diagnosis not present

## 2019-12-05 DIAGNOSIS — I4891 Unspecified atrial fibrillation: Secondary | ICD-10-CM

## 2019-12-05 DIAGNOSIS — I63412 Cerebral infarction due to embolism of left middle cerebral artery: Principal | ICD-10-CM | POA: Diagnosis present

## 2019-12-05 DIAGNOSIS — R739 Hyperglycemia, unspecified: Secondary | ICD-10-CM | POA: Diagnosis present

## 2019-12-05 DIAGNOSIS — F05 Delirium due to known physiological condition: Secondary | ICD-10-CM | POA: Diagnosis not present

## 2019-12-05 DIAGNOSIS — R29711 NIHSS score 11: Secondary | ICD-10-CM | POA: Diagnosis not present

## 2019-12-05 DIAGNOSIS — E876 Hypokalemia: Secondary | ICD-10-CM | POA: Diagnosis present

## 2019-12-05 DIAGNOSIS — I639 Cerebral infarction, unspecified: Secondary | ICD-10-CM

## 2019-12-05 DIAGNOSIS — R2689 Other abnormalities of gait and mobility: Secondary | ICD-10-CM | POA: Diagnosis not present

## 2019-12-05 DIAGNOSIS — I4821 Permanent atrial fibrillation: Secondary | ICD-10-CM | POA: Diagnosis present

## 2019-12-05 DIAGNOSIS — Z833 Family history of diabetes mellitus: Secondary | ICD-10-CM

## 2019-12-05 DIAGNOSIS — R41841 Cognitive communication deficit: Secondary | ICD-10-CM | POA: Diagnosis not present

## 2019-12-05 DIAGNOSIS — I361 Nonrheumatic tricuspid (valve) insufficiency: Secondary | ICD-10-CM | POA: Diagnosis not present

## 2019-12-05 DIAGNOSIS — Z90710 Acquired absence of both cervix and uterus: Secondary | ICD-10-CM | POA: Diagnosis not present

## 2019-12-05 DIAGNOSIS — R29818 Other symptoms and signs involving the nervous system: Secondary | ICD-10-CM | POA: Diagnosis not present

## 2019-12-05 DIAGNOSIS — M6281 Muscle weakness (generalized): Secondary | ICD-10-CM | POA: Diagnosis not present

## 2019-12-05 DIAGNOSIS — I6522 Occlusion and stenosis of left carotid artery: Secondary | ICD-10-CM

## 2019-12-05 DIAGNOSIS — G459 Transient cerebral ischemic attack, unspecified: Secondary | ICD-10-CM | POA: Diagnosis not present

## 2019-12-05 DIAGNOSIS — R471 Dysarthria and anarthria: Secondary | ICD-10-CM | POA: Diagnosis present

## 2019-12-05 DIAGNOSIS — Z8719 Personal history of other diseases of the digestive system: Secondary | ICD-10-CM | POA: Diagnosis not present

## 2019-12-05 DIAGNOSIS — E785 Hyperlipidemia, unspecified: Secondary | ICD-10-CM | POA: Diagnosis present

## 2019-12-05 DIAGNOSIS — R0902 Hypoxemia: Secondary | ICD-10-CM | POA: Diagnosis not present

## 2019-12-05 DIAGNOSIS — Z7901 Long term (current) use of anticoagulants: Secondary | ICD-10-CM

## 2019-12-05 DIAGNOSIS — H9190 Unspecified hearing loss, unspecified ear: Secondary | ICD-10-CM | POA: Diagnosis present

## 2019-12-05 DIAGNOSIS — I69322 Dysarthria following cerebral infarction: Secondary | ICD-10-CM | POA: Diagnosis not present

## 2019-12-05 DIAGNOSIS — R55 Syncope and collapse: Secondary | ICD-10-CM

## 2019-12-05 DIAGNOSIS — R279 Unspecified lack of coordination: Secondary | ICD-10-CM | POA: Diagnosis not present

## 2019-12-05 DIAGNOSIS — Z853 Personal history of malignant neoplasm of breast: Secondary | ICD-10-CM

## 2019-12-05 DIAGNOSIS — I63212 Cerebral infarction due to unspecified occlusion or stenosis of left vertebral arteries: Secondary | ICD-10-CM | POA: Diagnosis not present

## 2019-12-05 DIAGNOSIS — I5030 Unspecified diastolic (congestive) heart failure: Secondary | ICD-10-CM | POA: Diagnosis not present

## 2019-12-05 DIAGNOSIS — R4781 Slurred speech: Secondary | ICD-10-CM | POA: Diagnosis not present

## 2019-12-05 DIAGNOSIS — R519 Headache, unspecified: Secondary | ICD-10-CM | POA: Diagnosis not present

## 2019-12-05 DIAGNOSIS — Z961 Presence of intraocular lens: Secondary | ICD-10-CM | POA: Diagnosis present

## 2019-12-05 DIAGNOSIS — R000 Tachycardia, unspecified: Secondary | ICD-10-CM | POA: Diagnosis not present

## 2019-12-05 DIAGNOSIS — Z7982 Long term (current) use of aspirin: Secondary | ICD-10-CM

## 2019-12-05 DIAGNOSIS — M2550 Pain in unspecified joint: Secondary | ICD-10-CM | POA: Diagnosis not present

## 2019-12-05 LAB — ECHOCARDIOGRAM COMPLETE
Height: 60 in
Weight: 2419.77 oz

## 2019-12-05 LAB — CBC
HCT: 46.2 % — ABNORMAL HIGH (ref 36.0–46.0)
HCT: 45.2 % (ref 36.0–46.0)
Hemoglobin: 15 g/dL (ref 12.0–15.0)
Hemoglobin: 14.9 g/dL (ref 12.0–15.0)
MCH: 29.1 pg (ref 26.0–34.0)
MCH: 29.6 pg (ref 26.0–34.0)
MCHC: 32.5 g/dL (ref 30.0–36.0)
MCHC: 33 g/dL (ref 30.0–36.0)
MCV: 89.7 fL (ref 80.0–100.0)
MCV: 89.7 fL (ref 80.0–100.0)
Platelets: 205 10*3/uL (ref 150–400)
Platelets: 211 10*3/uL (ref 150–400)
RBC: 5.15 MIL/uL — ABNORMAL HIGH (ref 3.87–5.11)
RBC: 5.04 MIL/uL (ref 3.87–5.11)
RDW: 13.8 % (ref 11.5–15.5)
RDW: 13.9 % (ref 11.5–15.5)
WBC: 7.7 10*3/uL (ref 4.0–10.5)
WBC: 6.5 10*3/uL (ref 4.0–10.5)
nRBC: 0 % (ref 0.0–0.2)
nRBC: 0 % (ref 0.0–0.2)

## 2019-12-05 LAB — COMPREHENSIVE METABOLIC PANEL
ALT: 15 U/L (ref 0–44)
AST: 24 U/L (ref 15–41)
Albumin: 3.8 g/dL (ref 3.5–5.0)
Alkaline Phosphatase: 77 U/L (ref 38–126)
Anion gap: 9 (ref 5–15)
BUN: 19 mg/dL (ref 8–23)
CO2: 24 mmol/L (ref 22–32)
Calcium: 9.4 mg/dL (ref 8.9–10.3)
Chloride: 109 mmol/L (ref 98–111)
Creatinine, Ser: 0.78 mg/dL (ref 0.44–1.00)
GFR calc Af Amer: >60 mL/min (ref >60)
GFR calc non Af Amer: >60 mL/min (ref >60)
Glucose, Bld: 124 mg/dL — ABNORMAL HIGH (ref 70–99)
Potassium: 3.4 mmol/L — ABNORMAL LOW (ref 3.5–5.1)
Sodium: 142 mmol/L (ref 135–145)
Total Bilirubin: 1.3 mg/dL — ABNORMAL HIGH (ref 0.3–1.2)
Total Protein: 7 g/dL (ref 6.5–8.1)

## 2019-12-05 LAB — I-STAT CHEM 8, ED
BUN: 20 mg/dL (ref 8–23)
Calcium, Ion: 1.14 mmol/L — ABNORMAL LOW (ref 1.15–1.40)
Chloride: 108 mmol/L (ref 98–111)
Creatinine, Ser: 0.8 mg/dL (ref 0.44–1.00)
Glucose, Bld: 124 mg/dL — ABNORMAL HIGH (ref 70–99)
HCT: 43 % (ref 36.0–46.0)
Hemoglobin: 14.6 g/dL (ref 12.0–15.0)
Potassium: 3 mmol/L — ABNORMAL LOW (ref 3.5–5.1)
Sodium: 145 mmol/L (ref 135–145)
TCO2: 24 mmol/L (ref 22–32)

## 2019-12-05 LAB — DIFFERENTIAL
Abs Immature Granulocytes: 0.02 10*3/uL (ref 0.00–0.07)
Basophils Absolute: 0.1 10*3/uL (ref 0.0–0.1)
Basophils Relative: 1 %
Eosinophils Absolute: 0.2 10*3/uL (ref 0.0–0.5)
Eosinophils Relative: 3 %
Immature Granulocytes: 0 %
Lymphocytes Relative: 19 %
Lymphs Abs: 1.5 10*3/uL (ref 0.7–4.0)
Monocytes Absolute: 0.5 10*3/uL (ref 0.1–1.0)
Monocytes Relative: 7 %
Neutro Abs: 5.4 10*3/uL (ref 1.7–7.7)
Neutrophils Relative %: 70 %

## 2019-12-05 LAB — TSH: TSH: 1.769 u[IU]/mL (ref 0.350–4.500)

## 2019-12-05 LAB — PROTIME-INR
INR: 1.1 (ref 0.8–1.2)
Prothrombin Time: 13.7 seconds (ref 11.4–15.2)

## 2019-12-05 LAB — APTT: aPTT: 32 seconds (ref 24–36)

## 2019-12-05 LAB — CBG MONITORING, ED: Glucose-Capillary: 104 mg/dL — ABNORMAL HIGH (ref 70–99)

## 2019-12-05 LAB — LIPID PANEL
Cholesterol: 182 mg/dL (ref 0–200)
HDL: 44 mg/dL (ref >40)
LDL Cholesterol: 126 mg/dL — ABNORMAL HIGH (ref 0–99)
Total CHOL/HDL Ratio: 4.1 RATIO
Triglycerides: 58 mg/dL (ref <150)
VLDL: 12 mg/dL (ref 0–40)

## 2019-12-05 LAB — SARS CORONAVIRUS 2 BY RT PCR (HOSPITAL ORDER, PERFORMED IN ~~LOC~~ HOSPITAL LAB): SARS Coronavirus 2: NEGATIVE

## 2019-12-05 LAB — CREATININE, SERUM
Creatinine, Ser: 0.7 mg/dL (ref 0.44–1.00)
GFR calc Af Amer: >60 mL/min (ref >60)
GFR calc non Af Amer: >60 mL/min (ref >60)

## 2019-12-05 LAB — GLUCOSE, CAPILLARY: Glucose-Capillary: 83 mg/dL (ref 70–99)

## 2019-12-05 LAB — ETHANOL: Alcohol, Ethyl (B): <10 mg/dL (ref <10)

## 2019-12-05 LAB — TROPONIN I (HIGH SENSITIVITY): Troponin I (High Sensitivity): 19 ng/L — ABNORMAL HIGH (ref <18)

## 2019-12-05 MED ORDER — ASPIRIN EC 81 MG PO TBEC: 81.0000 mg | DELAYED_RELEASE_TABLET | Freq: Every day | ORAL | Status: DC

## 2019-12-05 MED ORDER — DILTIAZEM HCL-DEXTROSE 125-5 MG/125ML-% IV SOLN (PREMIX)
5.0000 mg/h | INTRAVENOUS | Status: DC
  Administered 2019-12-05 (×2): 5 mg/h via INTRAVENOUS
  Filled 2019-12-05: qty 125

## 2019-12-05 MED ORDER — STROKE: EARLY STAGES OF RECOVERY BOOK
Freq: Once | Status: AC
  Filled 2019-12-05: qty 1

## 2019-12-05 MED ORDER — ACETAMINOPHEN 325 MG PO TABS
650.0000 mg | ORAL_TABLET | ORAL | Status: DC | PRN
  Administered 2019-12-05 – 2019-12-12 (×7): 650 mg via ORAL
  Filled 2019-12-05 (×7): qty 2

## 2019-12-05 MED ORDER — ENOXAPARIN SODIUM 40 MG/0.4ML ~~LOC~~ SOLN
40.0000 mg | Freq: Every day | SUBCUTANEOUS | Status: DC
  Administered 2019-12-05 – 2019-12-07 (×3): 40 mg via SUBCUTANEOUS
  Filled 2019-12-05 (×3): qty 0.4

## 2019-12-05 MED ORDER — EZETIMIBE 10 MG PO TABS
10.0000 mg | ORAL_TABLET | Freq: Every day | ORAL | Status: DC
  Administered 2019-12-05 – 2019-12-12 (×8): 10 mg via ORAL
  Filled 2019-12-05 (×8): qty 1

## 2019-12-05 MED ORDER — GABAPENTIN 100 MG PO CAPS
300.0000 mg | ORAL_CAPSULE | Freq: Once | ORAL | Status: AC
  Administered 2019-12-05: 300 mg via ORAL
  Filled 2019-12-05: qty 3

## 2019-12-05 MED ORDER — POTASSIUM CHLORIDE IN NACL 20-0.9 MEQ/L-% IV SOLN
INTRAVENOUS | Status: AC
  Filled 2019-12-05 (×2): qty 1000

## 2019-12-05 MED ORDER — ACETAMINOPHEN 160 MG/5ML PO SOLN: 650.0000 mg | ORAL | Status: DC | PRN

## 2019-12-05 MED ORDER — ROPINIROLE HCL 1 MG PO TABS
1.0000 mg | ORAL_TABLET | Freq: Every day | ORAL | Status: DC
  Administered 2019-12-05 – 2019-12-07 (×3): 1 mg via ORAL
  Filled 2019-12-05 (×3): qty 1

## 2019-12-05 MED ORDER — CARVEDILOL 6.25 MG PO TABS
18.7500 mg | ORAL_TABLET | Freq: Two times a day (BID) | ORAL | Status: DC
  Administered 2019-12-05 – 2019-12-09 (×8): 18.75 mg via ORAL
  Filled 2019-12-05 (×9): qty 1

## 2019-12-05 MED ORDER — ACETAMINOPHEN 650 MG RE SUPP: 650.0000 mg | RECTAL | Status: DC | PRN

## 2019-12-05 MED ORDER — ASPIRIN 325 MG PO TABS: 325.0000 mg | ORAL_TABLET | Freq: Every day | ORAL | Status: DC

## 2019-12-05 MED ORDER — SODIUM CHLORIDE 0.9 % IV SOLN: INTRAVENOUS | Status: DC

## 2019-12-05 MED ORDER — LORAZEPAM 2 MG/ML IJ SOLN
0.5000 mg | Freq: Once | INTRAMUSCULAR | Status: AC
  Administered 2019-12-05: 0.5 mg via INTRAVENOUS
  Filled 2019-12-05: qty 1

## 2019-12-05 MED ORDER — ASPIRIN EC 325 MG PO TBEC
325.0000 mg | DELAYED_RELEASE_TABLET | Freq: Every day | ORAL | Status: DC
  Administered 2019-12-06 – 2019-12-07 (×2): 325 mg via ORAL
  Filled 2019-12-05 (×2): qty 1

## 2019-12-05 MED ORDER — ASPIRIN 300 MG RE SUPP
300.0000 mg | Freq: Every day | RECTAL | Status: DC
  Administered 2019-12-05: 300 mg via RECTAL
  Filled 2019-12-05: qty 1

## 2019-12-05 MED ORDER — DILTIAZEM LOAD VIA INFUSION
10.0000 mg | Freq: Once | INTRAVENOUS | Status: AC
  Administered 2019-12-05: 10 mg via INTRAVENOUS
  Filled 2019-12-05: qty 10

## 2019-12-05 MED ORDER — POTASSIUM CHLORIDE CRYS ER 20 MEQ PO TBCR: 40.0000 meq | EXTENDED_RELEASE_TABLET | Freq: Once | ORAL | Status: DC

## 2019-12-05 NOTE — Progress Notes (Signed)
Paged on-call provider to clarify that pt can have nighttime dose of carvedilol. Chart says pt is allergic, but son claims the pt takes the med at home. [facility] gave med without any complications. Verbal order from provider to continue with PM dose of carvedilol.

## 2019-12-05 NOTE — Progress Notes (Signed)
Paged Dr Pietro Cassis through [facility] twice to see what he wants to do about carvedilol med dose.  She has allergy to Carvedilol and it says she has nausea and vomiting with it but the message popped up to discuss with physician before proceeding.  This nurse did page MD to see if OK to give, because pt said she did not think she was taking it at home, although it was in her home med list.  Her son did say that she does take it at home and the Dr was ok with her receiving it, as it is her home med.  She did drop down below 60 on some occasions with her heart rate so her cardizem drip was dcd.   She said she is sleepy because she did not get any sleep in the noisy ED last night.  She does awaken to participate with the assessment.

## 2019-12-05 NOTE — ED Notes (Signed)
Called  Shannin Stedge (Nuc Med) to [facility]

## 2019-12-05 NOTE — ED Triage Notes (Signed)
Pt BIB [facility] d/t CODE STROKE.   Last seen normal by son 6/17 1300. Pt states she was cooking dinner yesterday 6/17 1700 began to experience right side numbness and decreased mobility of right arm/leg. Pt states her symptoms have now improved continues to complain of decreased sensation of right side. EMS reports strength equal and bilateral.   Pt takes ASA daily, discontinued Plavix d/t suspected GI bleed.   EMS VS  BP 160/100 HR 90 Afib RR 16 SpO2 95 RA T 98.5 F CBG 133

## 2019-12-05 NOTE — Consult Note (Signed)
Referring Physician: Dr. Randal Buba    Chief Complaint: Acute onset of right sided weakness and slurred speech  HPI: Sara [name] is an 86 y.o. female presenting from home via EMS as a Code Stroke for acute onset of right sided weakness and slurred speech. Son dropped her off at home at 30 following a visit to her hairdresser. She states she felt normal then and continued to feel normal until 5 PM, when she abruptly became weak on the right and dropped the food she was cooking. When her son arrived home at 6 PM, he noticed slurred speech. After symptoms did not resolve, he called EMS at about 1:45 AM. On arrival by EMS, they noticed mild slurring of speech, but no focal weakness. Vitals en route: CBG 133, BP 160s/100, O2 sat 95% on RA, A-fib on rhythm strip.  She takes ASA at home. She was taken off Plavix 3 weeks ago for a GIB. Not on an anticoagulant. She has no prior history of stroke.   Her PMHx includes chronic diastolic HF, A-fib, history of breast CA, HLD, HTN and PAD.   LSN: [id number] TOSO: 1700 tPA Given: No: Out of time window  Past Medical History:  Diagnosis Date   Ankle edema    Mild, which is intermittent, usually mildly dependent, left slightly greater than the right.   Anxiety    Arthritis    In her knees   Balance problem    Due to weakened knee   Chronic diastolic heart failure (HCC)    Class I-II -- exacerbated by Afib RVR   Corneal edema    Severe corneal edema in right eye with multiple folds. Developed after cataract surgery 05/03/09   Dysrhythmia    a-fib    since aghe 86 yrs old   GERD (gastroesophageal reflux disease)    Hematuria    Resolved after coumadin was stopped   Hiatal hernia    With GERD   History of breast cancer    HOH (hard of hearing)    Hyperglycemia    Random, without any history of diabetes   Hyperlipidemia    Hypertension    Difficult to control. Renal Doppler 06/02/10 showed less that 60% bilateral renal artery  narrowing.   Hypokalemia    PAD (peripheral artery disease) (HCC)    Permanent atrial fibrillation    Rate control.  ASA.  NOT on DOAC or warfarin due to concern for falls &bleeding.   Posthemorrhagic anemia    Pseudophakia    Syncope 02/22/11   During OV on 02/22/11 her INR was 7.3 and was given 2.5 mg of vitamin K. Later that day she became diaphoretic & fainted.   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 fib to sinus rhythm   CATARACT EXTRACTION  05/03/09   CHOLECYSTECTOMY     EYE SURGERY     cataract   HEMORRHOID SURGERY     LOWER EXTREMITY ANGIOGRAPHY Right 03/18/2018   Procedure: LOWER EXTREMITY ANGIOGRAPHY;  Surgeon: Lorretta Harp, MD;  Location: [place];  Service: Cardiovascular;  Laterality: Right;   PERIPHERAL VASCULAR INTERVENTION  03/18/2018   Procedure: PERIPHERAL VASCULAR INTERVENTION;  Surgeon: Lorretta Harp,  MD;  Location: [place];  Service: Cardiovascular;;  left SFA   Remote hysterectomy     TEE WITHOUT CARDIOVERSION N/A 03/31/2016   Procedure: TRANSESOPHAGEAL ECHOCARDIOGRAM (TEE);  Surgeon: Jerline Pain, MD;  Location: [facility];  Service: Cardiovascular:  EF 55-60%. No vegetation or thrombus okay for DC CV   TONSILLECTOMY     TOTAL KNEE ARTHROPLASTY Right 08/26/10   By Dr. Elta Guadeloupe C. Yates. Cemented, computer assist   TOTAL KNEE ARTHROPLASTY Left 07/08/2018   Procedure: LEFT TOTAL KNEE ARTHROPLASTY CEMENTED;  Surgeon: Marybelle Killings, MD;  Location: [facility];  Service: Orthopedics;  Laterality: Left;   TRANSTHORACIC ECHOCARDIOGRAM  03/2016    Mild LVH. EF 65-70%. High LVEDP no RWMA. Severe LA dilation. Peak PAP ~ 37 mmHg    Family History   Problem Relation Age of Onset   Cancer Mother        Mouth   Hypertension Mother    Coronary artery disease Brother    Hypertension Brother    Diabetes type II Brother    Heart attack Sister    Hypertension Sister    Social History:  reports that she has never smoked. She has never used smokeless tobacco. She reports that she does not drink alcohol and does not use drugs.  Allergies:  Allergies  Allergen Reactions   Amiodarone Shortness Of Breath and Other (See Comments)    Stopped in Feb 2017 secondary to pulmonary complications   Aliskiren Other (See Comments) and Nausea Only    unknown   Amlodipine Besylate-Valsartan Other (See Comments) and Nausea Only    Unclear   Bystolic [Nebivolol Hcl] Other (See Comments)    unknown   Clonidine Derivatives Other (See Comments)    unknown   Dronedarone Nausea And Vomiting and Nausea Only   Hctz [Hydrochlorothiazide] Other (See Comments)    Currently taking without problem; question if this has to do with hypokalemia   Hydralazine Other (See Comments)    Also tolerated, but had orthostatic changes on standing medication   Lisinopril Other (See Comments) and Nausea Only    unknown   Nebivolol Nausea Only   Olmesartan Nausea Only and Other (See Comments)    Unclear of the intolerance   Oxycodone     Patient states decreased heart rate, decreased blood pressure. List as allergy.   Rythmol [Propafenone] Other (See Comments)    unknown   Statins Other (See Comments) and Nausea Only    unknown   Carvedilol Other (See Comments) and Nausea And Vomiting    fatigue   Prednisone Nausea And Vomiting and Other (See Comments)    Jittery     Home Medications: No current facility-administered medications on file prior to encounter.   Current Outpatient Medications on File Prior to Encounter  Medication Sig Dispense Refill   aspirin EC 81 MG tablet Take 81 mg by mouth daily.     carvedilol (COREG) 12.5 MG tablet  Take 1.5 tablets (18.75 mg total) by mouth 2 (two) times daily. 270 tablet 2   cholecalciferol (VITAMIN D3) 25 MCG (1000 UT) tablet Take 2,000 Units by mouth daily.     clopidogrel (PLAVIX) 75 MG tablet TAKE 1 TABLET BY MOUTH ONCE (1) DAILY WITH BREAKFAST 90 tablet 2   furosemide (LASIX) 40 MG tablet Take 80 mg in the mornings and 40 mg in the evenings 270 tablet 1   potassium chloride SA (KLOR-CON) 20 MEQ tablet Take 2 tablets (40 mEq total) by mouth 2 (  two) times daily. 360 tablet 0   rOPINIRole (REQUIP) 1 MG tablet Take 1 tablet by mouth at bedtime.   3  Note: Plavix was stopped 3 weeks ago   ROS: As per HPI. Does not endorse any additional symptoms.   Physical Examination: There were no vitals taken for this visit.  HEENT: [place]/AT Lungs: Respirations unlabored Ext: Warm and well perfused  Neurologic Examination: Mental Status: Awake and alert. Speech fluent with mild/subtle dysarthria. Able to follow all simple motor commands and answer basic questions regarding her recent symptoms. Could not follow command to repeat a phrase despite several attempts. Also could not follow a 3 step command. Could name thumb and pinky, but not forefinger or badge. Speech fluent without errors of grammar or syntax. Oriented to self, location, city, state, month, year and day of the week.  Cranial Nerves: II:  Visual fields intact with no extinction to DSS. PERRL.  III,IV, VI: No ptosis. EOMI. No nystagmus.   V,VII: Smile symmetric, facial temp sensation equal bilaterally VIII: HOH IX,X: No hypophonia XI: Symmetric shoulder shrug XII: Midline tongue extension  Motor: RUE with 4/5 strength proximally and distally. RUE also drifts slowly downwards when testing for pronator drift.  RLE 2-3/5 hip flexion, 4/5 knee extension and knee flexion LUE and LLE 5/5 Sensory: Temp and light touch intact throughout, bilaterally. No extinction to DSS.  Deep Tendon Reflexes:  1+ bilateral biceps and  brachioradialis Hypoactive patellar reflexes 0 achilles bilaterally Toes upgoing bilaterally Cerebellar: Slow FNF bilaterally without ataxia.  Gait: Deferred  Results for orders placed or performed during the hospital encounter of 12/05/19 (from the past 48 hour(s))  CBG monitoring, ED     Status: Abnormal   Collection Time: 12/05/19  2:28 AM  Result Value Ref Range   Glucose-Capillary 104 (H) 70 - 99 mg/dL    Comment: Glucose reference range applies only to samples taken after fasting for at least 8 hours.   No results found.  Assessment: 86 y.o. female presenting with acute onset of right sided weakness and dysarthria 1. In the context of her symptoms, exam findings best localize as a small stroke in the left hemisphere 2. CT head with no acute abnormality. Mild age-related cerebral atrophy with chronic small vessel ischemic disease is noted.  3. Not a tPA candidate based on time criteria. 4. Clinical presentation not consistent with LVO 5. Stroke Risk Factors - chronic diastolic HF, A-fib, history of breast CA, HLD, HTN and PAD.  6. She takes ASA at home. She was taken off Plavix 3 weeks ago for a GIB. Not on an anticoagulant due to falls risk.   Recommendations: 1. HgbA1c, fasting lipid panel 2. MRI, MRA of the brain without contrast 3. PT consult, OT consult, Speech consult 4. Echocardiogram 5. Carotid dopplers 6. Prophylactic therapy- Continue ASA 7. Risk factor modification 8. Telemetry monitoring 9. Frequent neuro checks 10. Modified permissive HTN protocol given advanced age. Treat if SBP > 180  @Electronically  signed: Dr. Kerney Elbe  12/05/2019, 2:31 AM

## 2019-12-05 NOTE — Progress Notes (Signed)
Tele called to report wide QRS for 3 to 4 beats. Upon assessment pt was drowsy and exhibiting slurred speech. After about 2 mins the slurred speech resolved. Paged Dr. Cheral Marker who will review pt chart. Pt is currently alert & oriented x4. Vitals taken.    12/05/19 2307  Vitals  Temp 98.9 F (37.2 C)  Temp Source Oral  BP 101/60  MAP (mmHg) 74  BP Location Right Arm  BP Method Automatic  Patient Position (if appropriate) Lying  Pulse Rate (!) 101  Pulse Rate Source Monitor  ECG Heart Rate 84  Resp 20  Level of Consciousness  Level of Consciousness Alert  Oxygen Therapy  SpO2 97 %  O2 Device Nasal Cannula  O2 Flow Rate (L/min) 3 L/min  Glasgow Coma Scale  Eye Opening 4  Best Verbal Response (NON-intubated) 5  Best Motor Response 6  Glasgow Coma Scale Score 15  MEWS Score  MEWS Temp 0  MEWS Systolic 0  MEWS Pulse 0  MEWS RR 0  MEWS LOC 0  MEWS Score 0  MEWS Score Color Green

## 2019-12-05 NOTE — Evaluation (Signed)
Physical Therapy Evaluation Patient Details Name: Sara [name] MRN: [id number] DOB: 1926-02-08 Today's Date: 12/05/2019   History of Present Illness  86 yo admitted with right weakness outside the window for tPA with scattered Left MCA infarcts. PMHx: HTN, AFib, CHF, GIB, breast CA, HOH  Clinical Impression  Pt pleasant but lethargic on arrival with decreased attention and cues to attend to task and questions. Pt with decreased strength RUE and RLE with inconsistent strength demonstration of 1-2-/5 on entire right side. Pt with decreased ability with transfers, mobility, function and cognition who will benefit from acute therapy to maximize mobility, safety and strength to decrease burden of care.   BP 130/80 (93) HR 76 94% RA    Follow Up Recommendations CIR    Equipment Recommendations  Other (comment) (TBD)    Recommendations for Other Services OT consult;Rehab consult     Precautions / Restrictions Precautions Precautions: Fall Precaution Comments: right hemiparesis Restrictions Weight Bearing Restrictions: No      Mobility  Bed Mobility Overal bed mobility: Needs Assistance Bed Mobility: Supine to Sit;Sit to Supine     Supine to sit: Max assist Sit to supine: Max assist   General bed mobility comments: max assist to transition legs off EOB and elevate trunk. Return to supine with assist to control trunk and legs and total +2 to slide toward HOB.  Transfers                 General transfer comment: unsafe to attempt without +2 assist  Ambulation/Gait                Stairs            Wheelchair Mobility    Modified Rankin (Stroke Patients Only) Modified Rankin (Stroke Patients Only) Pre-Morbid Rankin Score: Slight disability Modified Rankin: Severe disability     Balance Overall balance assessment: Needs assistance Sitting-balance support: Feet supported;Single extremity supported Sitting balance-Leahy Scale: Poor Sitting  balance - Comments: mod assist with pt with posterior right LOB in sitting                                     Pertinent Vitals/Pain Pain Assessment: No/denies pain    Home Living Family/patient expects to be discharged to:: Private residence Living Arrangements: Children Available Help at Discharge: (P) Family Type of Home: (P) House Home Access: Ramped entrance     Home Layout: One level Home Equipment: Environmental consultant - 2 wheels;Wheelchair - manual;Cane - single point Additional Comments: pt lives with son who is currently not working    Prior Function Level of Independence: Independent with assistive device(s)               Hand Dominance        Extremity/Trunk Assessment   Upper Extremity Assessment Upper Extremity Assessment: RUE deficits/detail RUE Deficits / Details: pt with 3/5 elbow flexion and grip strenght . initially 1/5 shoulder flexion with progression to 2-/5 during session with inconsistent movement. pt reports intact light touch did not fully assess    Lower Extremity Assessment Lower Extremity Assessment: RLE deficits/detail RLE Deficits / Details: gravity eliminated hip flexion 2-/5 and knee flexion 2-/5 in sitting only 1/5 hip flexion and knee extension    Cervical / Trunk Assessment Cervical / Trunk Assessment: Kyphotic (right posterior lean in sitting)  Communication   Communication: HOH  Cognition Arousal/Alertness: Lethargic Behavior During Therapy: WFL for tasks  assessed/performed Overall Cognitive Status: Impaired/Different from baseline Area of Impairment: Following commands;Safety/judgement;Orientation                 Orientation Level: Time     Following Commands: Follows one step commands inconsistently Safety/Judgement: Decreased awareness of deficits     General Comments: pt oriented to day not month, pt unaware of position of RUE in bed and sitting. Pt initially stating son works then reported he has been out  of work since [employer] Comments      Exercises     Assessment/Plan    PT Assessment Patient needs continued PT services  PT Problem List Decreased strength;Decreased mobility;Decreased range of motion;Decreased activity tolerance;Decreased balance;Decreased knowledge of use of DME;Decreased cognition       PT Treatment Interventions DME instruction;Therapeutic exercise;Balance training;Functional mobility training;Therapeutic activities;Patient/family education;Neuromuscular re-education    PT Goals (Current goals can be found in the Care Plan section)  Acute Rehab PT Goals Patient Stated Goal: be able to move and return home PT Goal Formulation: With patient Time For Goal Achievement: 12/19/19 Potential to Achieve Goals: Fair    Frequency Min 4X/week   Barriers to discharge        Co-evaluation               AM-PAC PT "6 Clicks" Mobility  Outcome Measure Help needed turning from your back to your side while in a flat bed without using bedrails?: A Lot Help needed moving from lying on your back to sitting on the side of a flat bed without using bedrails?: A Lot Help needed moving to and from a bed to a chair (including a wheelchair)?: Total Help needed standing up from a chair using your arms (e.g., wheelchair or bedside chair)?: Total Help needed to walk in hospital room?: Total Help needed climbing 3-5 steps with a railing? : Total 6 Click Score: 8    End of Session   Activity Tolerance: Patient limited by lethargy Patient left: in bed;with call bell/phone within reach;with nursing/sitter in room;with bed alarm set Nurse Communication: Mobility status PT Visit Diagnosis: Other abnormalities of gait and mobility (R26.89);Difficulty in walking, not elsewhere classified (R26.2);Hemiplegia and hemiparesis Hemiplegia - Right/Left: Right Hemiplegia - dominant/non-dominant: Dominant Hemiplegia - caused by: Cerebral infarction    Time: 1150-1212 PT  Time Calculation (min) (ACUTE ONLY): 22 min   Charges:   PT Evaluation $PT Eval Moderate Complexity: 1 Mod          Treniya Lobb P, PT Acute Rehabilitation Services Pager: [phone] Office: [phone]   Sandy Salaam Precilla Purnell 12/05/2019, 12:29 PM

## 2019-12-05 NOTE — ED Provider Notes (Addendum)
[facility] EMERGENCY DEPARTMENT Provider Note   CSN: [id number] Arrival date & time: 12/05/19  0225  An emergency department physician performed an initial assessment on this suspected stroke patient at [facility].  History Chief Complaint  Patient presents with  . Code Stroke    Saphronia Ozdemir is a 86 y.o. female.  The history is provided by the patient and the EMS personnel.  Cerebrovascular Accident This is a new problem. The current episode started 6 to 12 hours ago. The problem occurs constantly. The problem has been gradually improving. Pertinent negatives include no chest pain, no abdominal pain, no headaches and no shortness of breath. Nothing aggravates the symptoms. Nothing relieves the symptoms. She has tried nothing for the symptoms. The treatment provided mild relief.  Patient with AFIB off anticoagulation d/t GIB presents with R sided weakness and speech difficulty. LSN by family at 1 pm but patient reports was normal until 6 pm when she could not hold a pan cooking dinner.       Past Medical History:  Diagnosis Date  . Ankle edema    Mild, which is intermittent, usually mildly dependent, left slightly greater than the right.  . Anxiety   . Arthritis    In her knees  . Balance problem    Due to weakened knee  . Chronic diastolic heart failure (HCC)    Class I-II -- exacerbated by Afib RVR  . Corneal edema    Severe corneal edema in right eye with multiple folds. Developed after cataract surgery 05/03/09  . Dysrhythmia    a-fib    since aghe 86 yrs old  . GERD (gastroesophageal reflux disease)   . Hematuria    Resolved after coumadin was stopped  . Hiatal hernia    With GERD  . History of breast cancer   . HOH (hard of hearing)   . Hyperglycemia    Random, without any history of diabetes  . Hyperlipidemia   . Hypertension    Difficult to control. Renal Doppler 06/02/10 showed less that 60% bilateral renal artery narrowing.  . Hypokalemia     . PAD (peripheral artery disease) ([place])   . Permanent atrial fibrillation (HCC)    Rate control.  ASA.  NOT on DOAC or warfarin due to concern for falls &bleeding.  Marland Kitchen Posthemorrhagic anemia   . Pseudophakia   . Syncope 02/22/11   During OV on 02/22/11 her INR was 7.3 and was given 2.5 mg of vitamin K. Later that day she became diaphoretic & fainted.  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 Arthritis of left knee 07/08/2018  . Left hand pain 04/23/2018  . Claudication in peripheral vascular disease ([place]) 03/18/2018  . Peripheral arterial disease ([place]) 02/27/2018  . Preop cardiovascular exam 01/19/2018  . Unilateral primary osteoarthritis, left knee 01/08/2018  . Status post total left knee replacement 11/16/2017  . Hypokalemia   . Chronic diastolic CHF (congestive heart failure), NYHA class 1 ([place]) 05/16/2016  . Gait abnormality 04/11/2016  . History of breast cancer 04/11/2016  . Persistent atrial fibrillation;; CHA2DS2-VASc Score and unadjusted Ischemic Stroke Rate (% per year) is equal to 4.8 % stroke rate/year from a score of 4 03/29/2016  . Atypical syncope 03/26/2016  . 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 Obesity (BMI 30-39.9) 03/18/2013  . Essential hypertension 03/18/2013    Past Surgical History:  Procedure Laterality Date  .  ABDOMINAL HYSTERECTOMY    . BREAST BIOPSY     3 biopsies  . CARDIOVERSION N/A 03/31/2016   Procedure: CARDIOVERSION;  Surgeon: Jerline Pain, MD;  Location: [facility] ENDOSCOPY;  Service: Cardiovascular: Successful cardioversion from A. fib to sinus rhythm  . CATARACT EXTRACTION  05/03/09  . CHOLECYSTECTOMY    . EYE SURGERY     cataract  . HEMORRHOID SURGERY    . LOWER EXTREMITY ANGIOGRAPHY Right 03/18/2018   Procedure: LOWER EXTREMITY ANGIOGRAPHY;  Surgeon: Lorretta Harp, MD;  Location: [facility] CV LAB;  Service: Cardiovascular;  Laterality: Right;  . PERIPHERAL VASCULAR  INTERVENTION  03/18/2018   Procedure: PERIPHERAL VASCULAR INTERVENTION;  Surgeon: Lorretta Harp, MD;  Location: [place] CV LAB;  Service: Cardiovascular;;  left SFA  . Remote hysterectomy    . TEE WITHOUT CARDIOVERSION N/A 03/31/2016   Procedure: TRANSESOPHAGEAL ECHOCARDIOGRAM (TEE);  Surgeon: Jerline Pain, MD;  Location: [facility];  Service: Cardiovascular:  EF 55-60%. No vegetation or thrombus okay for DC CV  . TONSILLECTOMY    . TOTAL KNEE ARTHROPLASTY Right 08/26/10   By Dr. Elta Guadeloupe C. Yates. Cemented, computer assist  . TOTAL KNEE ARTHROPLASTY Left 07/08/2018   Procedure: LEFT TOTAL KNEE ARTHROPLASTY CEMENTED;  Surgeon: Marybelle Killings, MD;  Location: [facility];  Service: Orthopedics;  Laterality: Left;  . TRANSTHORACIC ECHOCARDIOGRAM  03/2016    Mild LVH. EF 65-70%. High LVEDP no RWMA. Severe LA dilation. Peak PAP ~ 37 mmHg     OB History   No obstetric history on file.     Family History  Problem Relation Age of Onset  . Cancer Mother        Mouth  . Hypertension Mother   . Coronary artery disease Brother   . Hypertension Brother   . Diabetes type II Brother   . Heart attack Sister   . Hypertension Sister     Social History   Tobacco Use  . Smoking status: Never Smoker  . Smokeless tobacco: Never Used  Vaping Use  . Vaping Use: Never used  Substance Use Topics  . Alcohol use: No  . Drug use: No    Home Medications Prior to Admission medications   Medication Sig Start Date End Date Taking? Authorizing Provider  aspirin EC 81 MG tablet Take 81 mg by mouth daily.    [provider]  carvedilol (COREG) 12.5 MG tablet Take 1.5 tablets (18.75 mg total) by mouth 2 (two) times daily. 04/01/19 06/30/19  Lorretta Harp, MD  cholecalciferol (VITAMIN D3) 25 MCG (1000 UT) tablet Take 2,000 Units by mouth daily.    [provider]  clopidogrel (PLAVIX) 75 MG tablet TAKE 1 TABLET BY MOUTH ONCE (1) DAILY WITH BREAKFAST 05/23/19   Kathyrn Drown D, NP    furosemide (LASIX) 40 MG tablet Take 80 mg in the mornings and 40 mg in the evenings 12/04/19   Lorretta Harp, MD  potassium chloride SA (KLOR-CON) 20 MEQ tablet Take 2 tablets (40 mEq total) by mouth 2 (two) times daily. 05/21/19   Lorretta Harp, MD  rOPINIRole (REQUIP) 1 MG tablet Take 1 tablet by mouth at bedtime.  02/18/16   [provider]    Allergies    Amiodarone, Aliskiren, Amlodipine besylate-valsartan, Bystolic [nebivolol hcl], Clonidine derivatives, Dronedarone, Hctz [hydrochlorothiazide], Hydralazine, Lisinopril, Nebivolol, Olmesartan, Oxycodone, Rythmol [propafenone], Statins, Carvedilol, and Prednisone  Review of Systems   Review of Systems  Unable to perform ROS: Acuity of condition  Constitutional:  Negative for fever.  Eyes: Negative for visual disturbance.  Respiratory: Negative for shortness of breath.   Cardiovascular: Negative for chest pain.  Gastrointestinal: Negative for abdominal pain.  Neurological: Positive for speech difficulty and weakness. Negative for headaches.    Physical Exam Updated Vital Signs BP (!) 154/92   Pulse 100   Temp (!) 97.5 F (36.4 C) (Oral)   Resp 16   SpO2 96%   Physical Exam Vitals and nursing note reviewed.  Constitutional:      General: She is not in acute distress.    Appearance: Normal appearance.  HENT:     Head: Normocephalic and atraumatic.     Nose: Nose normal.     Mouth/Throat:     Mouth: Mucous membranes are moist.     Pharynx: Oropharynx is clear.  Eyes:     Conjunctiva/sclera: Conjunctivae normal.     Pupils: Pupils are equal, round, and reactive to light.  Cardiovascular:     Rate and Rhythm: Normal rate. Rhythm irregular.     Pulses: Normal pulses.     Heart sounds: Normal heart sounds.  Pulmonary:     Effort: Pulmonary effort is normal. No respiratory distress.     Breath sounds: Normal breath sounds. No wheezing or rales.  Abdominal:     General: Abdomen is flat. Bowel sounds are  normal.     Tenderness: There is no abdominal tenderness. There is no guarding.  Musculoskeletal:        General: Normal range of motion.     Cervical back: Normal range of motion and neck supple.     Right lower leg: No edema.     Left lower leg: No edema.  Skin:    General: Skin is warm and dry.     Capillary Refill: Capillary refill takes less than 2 seconds.  Neurological:     Mental Status: She is alert.     Deep Tendon Reflexes: Reflexes normal.     Comments: Word finding difficulty   Psychiatric:        Mood and Affect: Mood normal.     ED Results / Procedures / Treatments   Labs (all labs ordered are listed, but only abnormal results are displayed) Results for orders placed or performed during the hospital encounter of 12/05/19  Protime-INR  Result Value Ref Range   Prothrombin Time 13.7 11.4 - 15.2 seconds   INR 1.1 0.8 - 1.2  APTT  Result Value Ref Range   aPTT 32 24 - 36 seconds  CBC  Result Value Ref Range   WBC 7.7 4.0 - 10.5 K/uL   RBC 5.15 (H) 3.87 - 5.11 MIL/uL   Hemoglobin 15.0 12.0 - 15.0 g/dL   HCT 46.2 (H) 36 - 46 %   MCV 89.7 80.0 - 100.0 fL   MCH 29.1 26.0 - 34.0 pg   MCHC 32.5 30.0 - 36.0 g/dL   RDW 13.8 11.5 - 15.5 %   Platelets 205 150 - 400 K/uL   nRBC 0.0 0.0 - 0.2 %  Differential  Result Value Ref Range   Neutrophils Relative % 70 %   Neutro Abs 5.4 1.7 - 7.7 K/uL   Lymphocytes Relative 19 %   Lymphs Abs 1.5 0.7 - 4.0 K/uL   Monocytes Relative 7 %   Monocytes Absolute 0.5 0 - 1 K/uL   Eosinophils Relative 3 %   Eosinophils Absolute 0.2 0 - 0 K/uL   Basophils Relative 1 %   Basophils Absolute  0.1 0 - 0 K/uL   Immature Granulocytes 0 %   Abs Immature Granulocytes 0.02 0.00 - 0.07 K/uL  I-stat chem 8, ED  Result Value Ref Range   Sodium 145 135 - 145 mmol/L   Potassium 3.0 (L) 3.5 - 5.1 mmol/L   Chloride 108 98 - 111 mmol/L   BUN 20 8 - 23 mg/dL   Creatinine, Ser 0.80 0.44 - 1.00 mg/dL   Glucose, Bld 124 (H) 70 - 99 mg/dL    Calcium, Ion 1.14 (L) 1.15 - 1.40 mmol/L   TCO2 24 22 - 32 mmol/L   Hemoglobin 14.6 12.0 - 15.0 g/dL   HCT 43.0 36 - 46 %  CBG monitoring, ED  Result Value Ref Range   Glucose-Capillary 104 (H) 70 - 99 mg/dL   CT HEAD CODE STROKE WO CONTRAST  Result Date: 12/05/2019 CLINICAL DATA:  Code stroke. Initial evaluation for acute slurred speech, weakness. EXAM: CT HEAD WITHOUT CONTRAST TECHNIQUE: Contiguous axial images were obtained from the base of the skull through the vertex without intravenous contrast. COMPARISON:  Prior study from 08/21/2015. FINDINGS: Brain: Generalized age-related cerebral atrophy with mild chronic small vessel ischemic disease. No acute intracranial hemorrhage. No acute large vessel territory infarct. No mass lesion, midline shift or mass effect. No hydrocephalus or extra-axial fluid collection. Vascular: No hyperdense vessel. Calcified atherosclerosis present at the skull base. Skull: Scalp soft tissues and calvarium within normal limits. Sinuses/Orbits: Globes and orbital soft tissues within normal limits. Paranasal sinuses are clear. No mastoid effusion. Other: None. ASPECTS [facility] Stroke Program Early CT Score) - Ganglionic level infarction (caudate, lentiform nuclei, internal capsule, insula, M1-M3 cortex): 7 - Supraganglionic infarction (M4-M6 cortex): 3 Total score (0-10 with 10 being normal): 10 IMPRESSION: 1. No acute intracranial infarct or other abnormality. 2. ASPECTS is 10. 3. Mild age-related cerebral atrophy with chronic small vessel ischemic disease. These results were communicated to Dr. Cheral Marker at 2:47 amon 6/18/2021by text page via the [facility] messaging system. Electronically Signed   By: Jeannine Boga M.D.   On: 12/05/2019 02:48    EKG EKG Interpretation  Date/Time:  Friday December 05 2019 02:47:53 EDT Ventricular Rate:  113 PR Interval:    QRS Duration: 139 QT Interval:  370 QTC Calculation: 508 R Axis:   77 Text Interpretation: afib with RVR Right  bundle branch block ST depression, consider ischemia, diffuse lds Confirmed by Randal Buba, Dani Wallner ([id number]) on 12/05/2019 3:03:00 AM   Radiology CT HEAD CODE STROKE WO CONTRAST  Result Date: 12/05/2019 CLINICAL DATA:  Code stroke. Initial evaluation for acute slurred speech, weakness. EXAM: CT HEAD WITHOUT CONTRAST TECHNIQUE: Contiguous axial images were obtained from the base of the skull through the vertex without intravenous contrast. COMPARISON:  Prior study from 08/21/2015. FINDINGS: Brain: Generalized age-related cerebral atrophy with mild chronic small vessel ischemic disease. No acute intracranial hemorrhage. No acute large vessel territory infarct. No mass lesion, midline shift or mass effect. No hydrocephalus or extra-axial fluid collection. Vascular: No hyperdense vessel. Calcified atherosclerosis present at the skull base. Skull: Scalp soft tissues and calvarium within normal limits. Sinuses/Orbits: Globes and orbital soft tissues within normal limits. Paranasal sinuses are clear. No mastoid effusion. Other: None. ASPECTS [facility] Stroke Program Early CT Score) - Ganglionic level infarction (caudate, lentiform nuclei, internal capsule, insula, M1-M3 cortex): 7 - Supraganglionic infarction (M4-M6 cortex): 3 Total score (0-10 with 10 being normal): 10 IMPRESSION: 1. No acute intracranial infarct or other abnormality. 2. ASPECTS is 10. 3. Mild age-related cerebral atrophy with  chronic small vessel ischemic disease. These results were communicated to Dr. Cheral Marker at 2:47 amon 6/18/2021by text page via the [facility] messaging system. Electronically Signed   By: Jeannine Boga M.D.   On: 12/05/2019 02:48    Procedures Procedures (including critical care time)  Medications Ordered in ED Medications  LORazepam (ATIVAN) injection 0.5 mg (0 mg Intravenous Hold 12/05/19 0259)  diltiazem (CARDIZEM) 1 mg/mL load via infusion 10 mg (has no administration in time range)    And  diltiazem (CARDIZEM) 125 mg in  dextrose 5% 125 mL (1 mg/mL) infusion (has no administration in time range)    MDM Reviewed: nursing note and vitals Interpretation: labs, ECG and CT scan (negative covid no bleed on CT by me ) Total time providing critical care: 30-74 minutes (diltiazem drip). This excludes time spent performing separately reportable procedures and services. Consults: admitting MD and neurology  CRITICAL CARE Performed by: Dayjah Selman K Marleta Lapierre-Rasch Total critical care time: 60 minutes Critical care time was exclusive of separately billable procedures and treating other patients. Critical care was necessary to treat or prevent imminent or life-threatening deterioration. Critical care was time spent personally by me on the following activities: development of treatment plan with patient and/or surrogate as well as nursing, discussions with consultants, evaluation of patient's response to treatment, examination of patient, obtaining history from patient or surrogate, ordering and performing treatments and interventions, ordering and review of laboratory studies, ordering and review of radiographic studies, pulse oximetry and re-evaluation of patient's condition.  ED Course  I have reviewed the triage vital signs and the nursing notes.  Pertinent labs & imaging results that were available during my care of the patient were reviewed by me and considered in my medical decision making (see chart for details).    Will admit for further work up.  Covid sent.  Patient is going to MRI at this time  Final Clinical Impression(s) / ED Diagnoses [place], Aayansh Codispoti, MD 12/05/19 [place], Azaylah Stailey, MD 12/05/19 1021

## 2019-12-05 NOTE — Progress Notes (Signed)
Rehab Admissions Coordinator Note:  Per PT recommendation, this patient was screened by Raechel Ache for appropriateness for an Inpatient Acute Rehab Consult.  At this time, we are recommending Inpatient Rehab consult. AC will place consult order per protocol.   Raechel Ache 12/05/2019, 1:47 PM  I can be reached at [phone].

## 2019-12-05 NOTE — ED Notes (Signed)
Pt to MRI

## 2019-12-05 NOTE — H&P (Signed)
History and Physical    Sara [name] HAL:[id number] DOB: [birth year] DOA: 12/05/2019  PCP: Sara Roger, MD  Patient coming from: Home.  Chief Complaint: Right-sided weakness.  HPI: Sara [name] is a 86 y.o. female with history of A. fib not on anticoagulation with risk of falls with history of hypertension CHF who was recently taken off Plavix patient is still on aspirin because of GI bleed 3 weeks ago was suddenly experiencing right lower extremity weakness around 5 PM last evening when patient was cooking.  Patient also had decreased grip strength in the right upper extremity and some slurred speech as noticed by patient's son.  Since the symptoms persisted patient was brought to the ER later at midnight.  ED Course: In the ER on exam patient has 3 x 5 strength in the right lower extremity and has decreased grip strength and positive for the right upper extremity.  Patient failed swallow evaluation.  CT head was unremarkable EKG showed A. fib with RVR for which patient was started on Cardizem infusion.  MRI brain is read by the neurologist as showing acute stroke.  Labs show potassium 3.4 otherwise largely unremarkable.  Review of Systems: As per HPI, rest all negative.   Past Medical History:  Diagnosis Date   Ankle edema    Mild, which is intermittent, usually mildly dependent, left slightly greater than the right.   Anxiety    Arthritis    In her knees   Balance problem    Due to weakened knee   Chronic diastolic heart failure (HCC)    Class I-II -- exacerbated by Afib RVR   Corneal edema    Severe corneal edema in right eye with multiple folds. Developed after cataract surgery 05/03/09   Dysrhythmia    a-fib    since aghe 86 yrs old   GERD (gastroesophageal reflux disease)    Hematuria    Resolved after coumadin was stopped   Hiatal hernia    With GERD   History of breast cancer    HOH (hard of hearing)    Hyperglycemia    Random, without any  history of diabetes   Hyperlipidemia    Hypertension    Difficult to control. Renal Doppler 06/02/10 showed less that 60% bilateral renal artery narrowing.   Hypokalemia    PAD (peripheral artery disease) (HCC)    Permanent atrial fibrillation (HCC)    Rate control.  ASA.  NOT on DOAC or warfarin due to concern for falls &bleeding.   Posthemorrhagic anemia    Pseudophakia    Syncope 02/22/11   During OV on 02/22/11 her INR was 7.3 and was given 2.5 mg of vitamin K. Later that day she became diaphoretic & fainted.   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 fib to sinus rhythm   CATARACT EXTRACTION  05/03/09   CHOLECYSTECTOMY     EYE SURGERY     cataract   HEMORRHOID SURGERY     LOWER EXTREMITY ANGIOGRAPHY Right 03/18/2018   Procedure: LOWER EXTREMITY ANGIOGRAPHY;  Surgeon: Lorretta Harp, MD;  Location: [facility];  Service: Cardiovascular;  Laterality: Right;   PERIPHERAL VASCULAR INTERVENTION  03/18/2018   Procedure: PERIPHERAL VASCULAR INTERVENTION;  Surgeon: Lorretta Harp, MD;  Location: [facility];  Service: Cardiovascular;;  left SFA   Remote hysterectomy     TEE WITHOUT CARDIOVERSION N/A 03/31/2016   Procedure: TRANSESOPHAGEAL ECHOCARDIOGRAM (TEE);  Surgeon: Jerline Pain, MD;  Location: [facility];  Service: Cardiovascular:  EF 55-60%. No vegetation or thrombus okay for DC CV   TONSILLECTOMY     TOTAL KNEE ARTHROPLASTY Right 08/26/10   By Dr. Elta Guadeloupe C. Yates. Cemented, computer assist   TOTAL KNEE ARTHROPLASTY Left 07/08/2018   Procedure: LEFT TOTAL KNEE ARTHROPLASTY CEMENTED;  Surgeon: Marybelle Killings, MD;  Location: [facility];  Service: Orthopedics;   Laterality: Left;   TRANSTHORACIC ECHOCARDIOGRAM  03/2016    Mild LVH. EF 65-70%. High LVEDP no RWMA. Severe LA dilation. Peak PAP ~ 37 mmHg     reports that she has never smoked. She has never used smokeless tobacco. She reports that she does not drink alcohol and does not use drugs.  Allergies  Allergen Reactions   Amiodarone Shortness Of Breath and Other (See Comments)    Stopped in Feb 2017 secondary to pulmonary complications   Aliskiren Other (See Comments) and Nausea Only    unknown   Amlodipine Besylate-Valsartan Other (See Comments) and Nausea Only    Unclear   Bystolic [Nebivolol Hcl] Other (See Comments)    unknown   Clonidine Derivatives Other (See Comments)    unknown   Dronedarone Nausea And Vomiting and Nausea Only   Hctz [Hydrochlorothiazide] Other (See Comments)    Currently taking without problem; question if this has to do with hypokalemia   Hydralazine Other (See Comments)    Also tolerated, but had orthostatic changes on standing medication   Lisinopril Other (See Comments) and Nausea Only    unknown   Nebivolol Nausea Only   Olmesartan Nausea Only and Other (See Comments)    Unclear of the intolerance   Oxycodone     Patient states decreased heart rate, decreased blood pressure. List as allergy.   Rythmol [Propafenone] Other (See Comments)    unknown   Statins Other (See Comments) and Nausea Only    unknown   Carvedilol Other (See Comments) and Nausea And Vomiting    fatigue   Prednisone Nausea And Vomiting and Other (See Comments)    Jittery     Family History  Problem Relation Age of Onset   Cancer Mother        Mouth   Hypertension Mother    Coronary artery disease Brother    Hypertension Brother    Diabetes type II Brother    Heart attack Sister    Hypertension Sister     Prior to Admission medications   Medication Sig Start Date End Date Taking? Authorizing Provider  aspirin EC 81 MG tablet Take 81 mg by mouth  daily.    [provider]  carvedilol (COREG) 12.5 MG tablet Take 1.5 tablets (18.75 mg total) by mouth 2 (two) times daily. 04/01/19 06/30/19  Lorretta Harp, MD  cholecalciferol (VITAMIN D3) 25 MCG (1000 UT) tablet Take 2,000 Units by mouth daily.    [provider]  clopidogrel (PLAVIX) 75 MG tablet TAKE 1 TABLET BY MOUTH ONCE (1) DAILY WITH BREAKFAST 05/23/19   Kathyrn Drown D, NP  furosemide (LASIX) 40 MG tablet Take 80 mg in the mornings and 40 mg in the evenings 12/04/19   Lorretta Harp, MD  potassium chloride SA (KLOR-CON)  20 MEQ tablet Take 2 tablets (40 mEq total) by mouth 2 (two) times daily. 05/21/19   Lorretta Harp, MD  rOPINIRole (REQUIP) 1 MG tablet Take 1 tablet by mouth at bedtime.  02/18/16   [provider]    Physical Exam: Constitutional: Moderately built and nourished. Vitals:   12/05/19 0400 12/05/19 0442 12/05/19 0458 12/05/19 0620  BP: (!) 150/77 (!) 142/89 139/61 (!) 140/59  Pulse: 71 94 71 82  Resp: 17 15 14 19   Temp:      TempSrc:      SpO2: 95% 96% 92% 94%   Eyes: Anicteric no pallor. ENMT: No discharge from the ears eyes nose or mouth. Neck: No mass felt.  No neck rigidity. Respiratory: No rhonchi or crepitations. Cardiovascular: S1-S2 heard. Abdomen: Soft nontender bowel sounds present. Musculoskeletal: No edema. Skin: No rash. Neurologic: Alert awake oriented time place and person.  Has right pronator drift right lower extremity is 3 x 5 in strength.  Left upper and lower extremities 5 x 5 strength.  Pupils are equal reacting to light.  No facial asymmetry. Psychiatric: Appears normal.   Labs on Admission: I have personally reviewed following labs and imaging studies  CBC: Recent Labs  Lab 12/05/19 0230 12/05/19 0235  WBC 7.7  --   NEUTROABS 5.4  --   HGB 15.0 14.6  HCT 46.2* 43.0  MCV 89.7  --   PLT 205  --    Basic Metabolic Panel: Recent Labs  Lab 12/05/19 0230 12/05/19 0235  NA 142 145  K 3.4*  3.0*  CL 109 108  CO2 24  --   GLUCOSE 124* 124*  BUN 19 20  CREATININE 0.78 0.80  CALCIUM 9.4  --    GFR: CrCl cannot be calculated (Unknown ideal weight.). Liver Function Tests: Recent Labs  Lab 12/05/19 0230  AST 24  ALT 15  ALKPHOS 77  BILITOT 1.3*  PROT 7.0  ALBUMIN 3.8   No results for input(s): LIPASE, AMYLASE in the last 168 hours. No results for input(s): AMMONIA in the last 168 hours. Coagulation Profile: Recent Labs  Lab 12/05/19 0230  INR 1.1   Cardiac Enzymes: No results for input(s): CKTOTAL, CKMB, CKMBINDEX, TROPONINI in the last 168 hours. BNP (last 3 results) No results for input(s): PROBNP in the last 8760 hours. HbA1C: No results for input(s): HGBA1C in the last 72 hours. CBG: Recent Labs  Lab 12/05/19 0228  GLUCAP 104*   Lipid Profile: No results for input(s): CHOL, HDL, LDLCALC, TRIG, CHOLHDL, LDLDIRECT in the last 72 hours. Thyroid Function Tests: No results for input(s): TSH, T4TOTAL, FREET4, T3FREE, THYROIDAB in the last 72 hours. Anemia Panel: No results for input(s): VITAMINB12, FOLATE, FERRITIN, TIBC, IRON, RETICCTPCT in the last 72 hours. Urine analysis:    Component Value Date/Time   COLORURINE YELLOW 06/28/2018 [place] 06/28/2018 1453   LABSPEC 1.019 06/28/2018 1453   PHURINE 6.0 06/28/2018 1453   GLUCOSEU NEGATIVE 06/28/2018 1453   HGBUR NEGATIVE 06/28/2018 1453   BILIRUBINUR NEGATIVE 06/28/2018 1453   KETONESUR NEGATIVE 06/28/2018 1453   PROTEINUR NEGATIVE 06/28/2018 1453   UROBILINOGEN 0.2 08/23/2010 1257   NITRITE NEGATIVE 06/28/2018 1453   LEUKOCYTESUR NEGATIVE 06/28/2018 1453   Sepsis Labs: @LABRCNTIP (procalcitonin:4,lacticidven:4) ) Recent Results (from the past 240 hour(s))  SARS Coronavirus 2 by RT PCR (hospital order, performed in [facility] hospital lab) Nasopharyngeal Nasopharyngeal Swab     Status: None   Collection Time: 12/05/19  2:49 AM  Specimen: Nasopharyngeal Swab  Result Value  Ref Range Status   SARS Coronavirus 2 NEGATIVE NEGATIVE Final    Comment: (NOTE) SARS-CoV-2 target nucleic acids are NOT DETECTED.  The SARS-CoV-2 RNA is generally detectable in upper and lower respiratory specimens during the acute phase of infection. The lowest concentration of SARS-CoV-2 viral copies this assay can detect is 250 copies / mL. A negative result does not preclude SARS-CoV-2 infection and should not be used as the sole basis for treatment or other patient management decisions.  A negative result may occur with improper specimen collection / handling, submission of specimen other than nasopharyngeal swab, presence of viral mutation(s) within the areas targeted by this assay, and inadequate number of viral copies (<250 copies / mL). A negative result must be combined with clinical observations, patient history, and epidemiological information.  Fact Sheet for Patients:   StrictlyIdeas.no  Fact Sheet for Healthcare Providers: BankingDealers.co.za  This test is not yet approved or  cleared by the [place] FDA and has been authorized for detection and/or diagnosis of SARS-CoV-2 by FDA under an Emergency Use Authorization (EUA).  This EUA will remain in effect (meaning this test can be used) for the duration of the COVID-19 declaration under Section 564(b)(1) of the Act, 21 U.S.C. section 360bbb-3(b)(1), unless the authorization is terminated or revoked sooner.  Performed at [facility], [place] [address] [place], [place] [address]      Radiological Exams on Admission: CT HEAD CODE STROKE WO CONTRAST  Result Date: 12/05/2019 CLINICAL DATA:  Code stroke. Initial evaluation for acute slurred speech, weakness. EXAM: CT HEAD WITHOUT CONTRAST TECHNIQUE: Contiguous axial images were obtained from the base of the skull through the vertex without intravenous contrast. COMPARISON:  Prior study from 08/21/2015. FINDINGS:  Brain: Generalized age-related cerebral atrophy with mild chronic small vessel ischemic disease. No acute intracranial hemorrhage. No acute large vessel territory infarct. No mass lesion, midline shift or mass effect. No hydrocephalus or extra-axial fluid collection. Vascular: No hyperdense vessel. Calcified atherosclerosis present at the skull base. Skull: Scalp soft tissues and calvarium within normal limits. Sinuses/Orbits: Globes and orbital soft tissues within normal limits. Paranasal sinuses are clear. No mastoid effusion. Other: None. ASPECTS [facility] Stroke Program Early CT Score) - Ganglionic level infarction (caudate, lentiform nuclei, internal capsule, insula, M1-M3 cortex): 7 - Supraganglionic infarction (M4-M6 cortex): 3 Total score (0-10 with 10 being normal): 10 IMPRESSION: 1. No acute intracranial infarct or other abnormality. 2. ASPECTS is 10. 3. Mild age-related cerebral atrophy with chronic small vessel ischemic disease. These results were communicated to Dr. Cheral Marker at 2:47 amon 6/18/2021by text page via the [facility] messaging system. Electronically Signed   By: Jeannine Boga M.D.   On: 12/05/2019 02:48    EKG: Independently reviewed.  A. fib with RVR.  Assessment/Plan Principal Problem:   Acute CVA (cerebrovascular accident) [facility]) Active Problems:   Essential hypertension   Atrial fibrillation (HCC)   Chronic diastolic CHF (congestive heart failure), NYHA class 1 ([place])    1. Acute CVA -discussed with Dr. Cheral Marker on-call neurologist.  At this time recommended aspirin.  When patient passes swallow may start statins.  Neurochecks patient failed swallow for which we have requested speech therapy.  Modified permissive hypertension up to 604 systolic.  Check hemoglobin A1c lipid panel carotid Doppler 2D echo. 2. A. fib with RVR on Cardizem drip.  Not on anticoagulation with risk for falls.  On aspirin.  Check TSH cardiac markers 2D echo. 3. Hypertension with history  of stroke will  allow for modified permissive hypertension as recommended by Dr. Cheral Marker neurologist.  Presently on Cardizem drip. 4. History of CHF appears compensated.  Given the acute CVA patient will need inpatient status.   DVT prophylaxis: Lovenox. Code Status: Full code. Family Communication: We will need to discuss with patient's son. Disposition Plan: To be determined. Consults called: Neurologist and speech therapist and physical therapy. Admission status: Inpatient.   Rise Patience MD [facility] Pager [phone].  If 7PM-7AM, please contact night-coverage [email or web address] Password [facility]  12/05/2019, 6:36 AM

## 2019-12-05 NOTE — Evaluation (Signed)
Speech Language Pathology Evaluation Patient Details Name: Sara [name] MRN: [id number] DOB: Oct 31, 1925 Today's Date: 12/05/2019 Time: 2841-3244 SLP Time Calculation (min) (ACUTE ONLY): 14 min  Problem List:  Patient Active Problem List   Diagnosis Date Noted  . Acute CVA (cerebrovascular accident) ([place]) 12/05/2019  . Arthritis of left knee 07/08/2018  . Left hand pain 04/23/2018  . Claudication in peripheral vascular disease ([place]) 03/18/2018  . Peripheral arterial disease ([place]) 02/27/2018  . Preop cardiovascular exam 01/19/2018  . Unilateral primary osteoarthritis, left knee 01/08/2018  . Status post total left knee replacement 11/16/2017  . Hypokalemia   . Chronic diastolic CHF (congestive heart failure), NYHA class 1 ([place]) 05/16/2016  . Gait abnormality 04/11/2016  . History of breast cancer 04/11/2016  . Atrial fibrillation ([place]) 03/29/2016  . Atypical syncope 03/26/2016  . 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 Obesity (BMI 30-39.9) 03/18/2013  . Essential hypertension 03/18/2013   Past Medical History:  Past Medical History:  Diagnosis Date  . Ankle edema    Mild, which is intermittent, usually mildly dependent, left slightly greater than the right.  . Anxiety   . Arthritis    In her knees  . Balance problem    Due to weakened knee  . Chronic diastolic heart failure (HCC)    Class I-II -- exacerbated by Afib RVR  . Corneal edema    Severe corneal edema in right eye with multiple folds. Developed after cataract surgery 05/03/09  . Dysrhythmia    a-fib    since aghe 86 yrs old  . GERD (gastroesophageal reflux disease)   . Hematuria    Resolved after coumadin was stopped  . Hiatal hernia    With GERD  . History of breast cancer   . HOH (hard of hearing)   . Hyperglycemia    Random, without any history of diabetes  . Hyperlipidemia   . Hypertension    Difficult to control. Renal Doppler 06/02/10 showed less that 60% bilateral renal artery narrowing.   . Hypokalemia   . PAD (peripheral artery disease) ([place])   . Permanent atrial fibrillation (HCC)    Rate control.  ASA.  NOT on DOAC or warfarin due to concern for falls &bleeding.  Marland Kitchen Posthemorrhagic anemia   . Pseudophakia   . Syncope 02/22/11   During OV on 02/22/11 her INR was 7.3 and was given 2.5 mg of vitamin K. Later that day she became diaphoretic & fainted.  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 ABDOMINAL HYSTERECTOMY    . BREAST BIOPSY     3 biopsies  . CARDIOVERSION N/A 03/31/2016   Procedure: CARDIOVERSION;  Surgeon: Jerline Pain, MD;  Location: [facility];  Service: Cardiovascular: Successful cardioversion from A. fib to sinus rhythm  . CATARACT EXTRACTION  05/03/09  . CHOLECYSTECTOMY    . EYE SURGERY     cataract  . HEMORRHOID SURGERY    . LOWER EXTREMITY ANGIOGRAPHY Right 03/18/2018   Procedure: LOWER EXTREMITY ANGIOGRAPHY;  Surgeon: Lorretta Harp, MD;  Location: [facility];  Service: Cardiovascular;  Laterality: Right;  . PERIPHERAL VASCULAR INTERVENTION  03/18/2018   Procedure: PERIPHERAL VASCULAR INTERVENTION;  Surgeon: Lorretta Harp, MD;  Location: [facility];  Service: Cardiovascular;;  left SFA  . Remote hysterectomy    . TEE WITHOUT CARDIOVERSION N/A 03/31/2016   Procedure: TRANSESOPHAGEAL ECHOCARDIOGRAM (TEE);  Surgeon: Thana Farr  Marlou Porch, MD;  Location: [facility];  Service: Cardiovascular:  EF 55-60%. No vegetation or thrombus okay for DC CV  . TONSILLECTOMY    . TOTAL KNEE ARTHROPLASTY Right 08/26/10   By Dr. Elta Guadeloupe C. Yates. Cemented, computer assist  . TOTAL KNEE ARTHROPLASTY Left 07/08/2018   Procedure: LEFT TOTAL KNEE ARTHROPLASTY CEMENTED;  Surgeon: Marybelle Killings, MD;  Location: [facility];  Service: Orthopedics;  Laterality: Left;  . TRANSTHORACIC ECHOCARDIOGRAM  03/2016    Mild LVH. EF 65-70%. High LVEDP no RWMA. Severe LA dilation. Peak PAP ~ 37 mmHg    HPI:   86 y.o. female presenting from home 6/18 via EMS as a Code Stroke for acute onset of right sided weakness and slurred speech.  PMHx includes chronic diastolic HF, A-fib, history of breast CA, HLD, HTN and PAD. MRI Positive for scattered acute infarcts in the Left MCA territory, primarily cortical. No associated hemorrhage or mass effect. Subtle involvement of the left MCA/ACA watershed..   Assessment / Plan / Recommendation Clinical Impression  Pt prsents with normal expressive/receptive language with no aphasia s/p left MCA territory CVA.  Speech is clear, without dysarthria, and fluent.  Pt does present with mild cognitive impairments, potentially present at baseline, but no family present to ascertain prior function.  She demonstrates fluctuating short-term recall, difficulty with selective attention, and impairments in verbal problem-solving and judgment.  SLP to follow briefly during acute care admission for the aforementioned deficits.  Pt will need 24 hour supervision.      SLP Assessment  SLP Recommendation/Assessment: Patient needs continued Speech Lanaguage Pathology Services SLP Visit Diagnosis: Cognitive communication deficit (R41.841)    Follow Up Recommendations  24 hour supervision/assistance    Frequency and Duration min 2x/week         SLP Evaluation Cognition  Overall Cognitive Status: Impaired/Different from baseline Arousal/Alertness: Awake/alert Orientation Level: Oriented X4 Attention: Sustained;Selective Sustained Attention: Appears intact Selective Attention: Impaired Selective Attention Impairment: Verbal basic Memory: Impaired Memory Impairment: Retrieval deficit Awareness: Impaired Problem Solving: Impaired Problem Solving Impairment: Verbal basic Safety/Judgment: Impaired       Comprehension  Auditory Comprehension Overall Auditory Comprehension: Appears within functional limits for tasks assessed Yes/No Questions: Within Functional  Limits Commands: Within Functional Limits Conversation: Simple Visual Recognition/Discrimination Discrimination: Within Function Limits Reading Comprehension Reading Status: Within funtional limits    Expression Expression Primary Mode of Expression: Verbal Verbal Expression Overall Verbal Expression: Appears within functional limits for tasks assessed Written Expression Dominant Hand: Right Written Expression: Not tested   Oral / Motor  Oral Motor/Sensory Function Overall Oral Motor/Sensory Function: Within functional limits Motor Speech Overall Motor Speech: Appears within functional limits for tasks assessed   GO                    Juan Quam Laurice 12/05/2019, 12:48 PM  Verneal Wiers L. Tivis Ringer, [place] Office number [phone] Pager [phone]

## 2019-12-05 NOTE — Progress Notes (Signed)
STROKE TEAM PROGRESS NOTE   INTERVAL HISTORY No family is at the bedside.  She is lying in bed, awake alert and orientated. She still has right side weakness. She denies any fall at home for the last one year. She does have afib and she is only on ASA. Her plavix was taken off due to LGIB. She said it was 3 weeks ago but when I called her son, he said it was almost one year ago and her doctor stopped plavix and referred to GI, but she had bad experience with GI doctor in [place], so no really GI work up done. But since off plavix, she had no LGIB so the issue was dropped off. However, given the current stroke, I discussed with son and he is in agreement to have eliquis trial. I also discussed with Dr. Pietro Cassis.    OBJECTIVE Vitals:   12/05/19 1501 12/05/19 1505 12/05/19 1523 12/05/19 1641  BP: 124/70  126/68 140/75  Pulse: (!) 56 (!) 58 72 79  Resp: 18 18 18 20   Temp: 97.8 F (36.6 C)  97.8 F (36.6 C) 97.8 F (36.6 C)  TempSrc: Oral  Oral Oral  SpO2: 96% 95% 96% 98%  Weight:      Height:        CBC:  Recent Labs  Lab 12/05/19 0230 12/05/19 0230 12/05/19 0235 12/05/19 1057  WBC 7.7  --   --  6.5  NEUTROABS 5.4  --   --   --   HGB 15.0   < > 14.6 14.9  HCT 46.2*   < > 43.0 45.2  MCV 89.7  --   --  89.7  PLT 205  --   --  211   < > = values in this interval not displayed.    Basic Metabolic Panel:  Recent Labs  Lab 12/05/19 0230 12/05/19 0230 12/05/19 0235 12/05/19 0751  NA 142  --  145  --   K 3.4*  --  3.0*  --   CL 109  --  108  --   CO2 24  --   --   --   GLUCOSE 124*  --  124*  --   BUN 19  --  20  --   CREATININE 0.78   < > 0.80 0.70  CALCIUM 9.4  --   --   --    < > = values in this interval not displayed.    Lipid Panel:     Component Value Date/Time   CHOL 182 12/05/2019 0751   TRIG 58 12/05/2019 0751   HDL 44 12/05/2019 0751   CHOLHDL 4.1 12/05/2019 0751   VLDL 12 12/05/2019 0751   LDLCALC 126 (H) 12/05/2019 0751   HgbA1c:  Lab Results   Component Value Date   HGBA1C  05/03/2009    5.6 (NOTE) The ADA recommends the following therapeutic goal for glycemic control related to Hgb A1c measurement: Goal of therapy: <6.5 Hgb A1c  Reference: American Diabetes Association: Clinical Practice Recommendations 2010, Diabetes Care, 2010, 33: (Suppl  1).   Urine Drug Screen: No results found for: LABOPIA, COCAINSCRNUR, LABBENZ, AMPHETMU, THCU, LABBARB  Alcohol Level     Component Value Date/Time   ETH <10 12/05/2019 0230    IMAGING  MR ANGIO HEAD WO CONTRAST 12/05/2019 IMPRESSION:  1. Negative for large vessel occlusion, but positive for extensive intracranial atherosclerosis:  2. Suspected High-grade distal Left ICA stenosis in the supraclinoid segment. But no significant Left MCA or  ACA stenosis or branch occlusion is identified.  3. Severely stenotic non dominant distal Right Vertebral Artery, with up to moderate stenosis at the junction of the dominant Left Vertebrobasilar junction.   MR BRAIN WO CONTRAST 12/05/2019 IMPRESSION:  1. Positive for scattered acute infarcts in the Left MCA territory, primarily cortical. No associated hemorrhage or mass effect. Subtle involvement of the left MCA/ACA watershed. See MRA today reported separately.  2. Otherwise stable since 2016 and largely negative for age noncontrast MRI appearance of the brain.   CT HEAD CODE STROKE WO CONTRAST 12/05/2019 IMPRESSION:  1. No acute intracranial infarct or other abnormality.  2. ASPECTS is 10.  3. Mild age-related cerebral atrophy with chronic small vessel ischemic disease.   Transthoracic Echocardiogram  1. Left ventricular ejection fraction, by estimation, is 60 to 65%. The  left ventricle has normal function. The left ventricle has no regional  wall motion abnormalities. There is mild left ventricular hypertrophy.  Left ventricular diastolic function  could not be evaluated.  2. Right ventricular systolic function is mildly reduced. The  right  ventricular size is normal. There is severely elevated pulmonary artery  systolic pressure. The estimated right ventricular systolic pressure is  94.7 mmHg.  3. Left atrial size was moderately dilated.  4. The mitral valve is normal in structure. Mild to moderate mitral valve  regurgitation. No evidence of mitral stenosis.  5. The tricuspid valve is myxomatous. Tricuspid valve regurgitation is  mild to moderate.  6. The aortic valve is normal in structure. Aortic valve regurgitation is  mild. No aortic stenosis is present.  7. The inferior vena cava is dilated in size with <50% respiratory  variability, suggesting right atrial pressure of 15 mmHg.   Bilateral Carotid Dopplers  Right Carotid: Velocities in the right ICA are consistent with a 1-39%  stenosis.   Left Carotid: Velocities in the left ICA are consistent with a 1-39%  stenosis. Upper end of range.     PHYSICAL EXAM  Temp:  [97.5 F (36.4 C)-98.7 F (37.1 C)] 97.8 F (36.6 C) (06/18 1641) Pulse Rate:  [56-112] 79 (06/18 1641) Resp:  [14-26] 20 (06/18 1641) BP: (122-160)/(59-115) 140/75 (06/18 1641) SpO2:  [90 %-98 %] 98 % (06/18 1641) Weight:  [68.6 kg] 68.6 kg (06/18 1027)  General - Well nourished, well developed, in no apparent distress, mildly lethargic.  Ophthalmologic - fundi not visualized due to noncooperation.  Cardiovascular - irregularly irregular heart rate and rhythm.  Mental Status -  Level of arousal and orientation to time, place, and person were intact. Language including expression, naming, repetition, comprehension was assessed and found intact. Mild dysarthria  Cranial Nerves II - XII - II - Visual field intact OU. III, IV, VI - Extraocular movements intact. V - Facial sensation intact bilaterally. VII - right nasolabial fold flattening. VIII - Hearing & vestibular intact bilaterally. X - Palate elevates symmetrically. XI - Chin turning & shoulder shrug intact  bilaterally. XII - Tongue protrusion intact.  Motor Strength - The patient's strength was normal in LUE and LLE extremities but RUE proximal 0/5 and bicep 2/5 and finger grip 2/5. RLE 1/5 but with pain, 2/5 withdraw.  Bulk was normal and fasciculations were absent.   Motor Tone - Muscle tone was assessed at the neck and appendages and was normal.  Reflexes - The patient's reflexes were symmetrical in all extremities and she had no pathological reflexes.  Sensory - Light touch, temperature/pinprick were assessed and were symmetrical.  Coordination - The patient had normal movements in the left hand with no ataxia or dysmetria.  Tremor was absent.  Gait and Station - deferred.   ASSESSMENT/PLAN [name] is a 86 y.o. female with history of chronic diastolic HF, A-fib (not anticoagulated, history of breast CA, HLD, HTN and PAD presenting with right sided weakness and slurred speech. She did not receive IV t-PA due to late presentation (>4.5 hours from time of onset).  Stroke: L MCA and ACA territory scattered infarcts - embolic - carotid stenosis vs atrial fibrillation not on AC.   CT Head - No acute intracranial infarct or other abnormality. ASPECTS is 10.   MRI head - scattered acute infarcts in the Left MCA territory, primarily cortical. No associated hemorrhage or mass effect. Subtle involvement of the left MCA/ACA watershed.    MRA head - Negative for large vessel occlusion, but positive for extensive intracranial atherosclerosis: High-grade distal Left ICA stenosis in the supraclinoid segment. Severely stenotic non dominant distal Right Vertebral Artery, with up to moderate stenosis at the junction of the dominant Left Vertebrobasilar junction.   Carotid Doppler unremarkable  2D Echo EF 60-65%  [employer] Virus 2 - negative  LDL - 126  HgbA1c pending  VTE prophylaxis - Lovenox  aspirin 81 mg daily and clopidogrel 75 mg daily prior to admission, now on ASA 325.  Recommend eliquis 3 days post stroke for stroke prevention. ASA can be discontinued then.   Patient counseled to be compliant with her antithrombotic medications  Ongoing aggressive stroke risk factor management  Therapy recommendations: CIR  Disposition:  Pending  Chronic afib  Long standing afib  Was on coumadin long time ago, but off due to GIB  Not on [facility] currently  Follows with cardiology Dr. Ellyn Hack and Dr. Gwenlyn Found  Was on cardizem drip -> now off  Discussed with son, will have eliquis trial. Will start in 3 days and stop ASA then  LGIB  Many years ago coumadin d/c due to LGIB  One year ago, plavix was d/c due to LGIB  No actual GI work up in the past  Currently no LGIB for a while  Discussed with son, we will start eliquis trial in 3 days given stroke with afib.   Hb stable 14.6->14.9  Close monitoring  Hypertension  Home BP meds: Coreg  Current BP meds: Coreg, cardizem IV  Stable . Long-term BP goal 130-150 given severe left ICA siphon stenosis  Hyperlipidemia  Home Lipid lowering medication: none   LDL 126, goal < 70  Current lipid lowering medication: none due to statin intolerance  Put on zetia   Continue zetia at discharge  Other Stroke Risk Factors  Advanced age  Overweight, Body mass index is 29.54 kg/m., recommend weight loss, diet and exercise as appropriate   Congestive Heart Failure  Other Active Problems  Code status - Full code  Hypokalemia - 3.4->3.0  Hospital day # 0  Neurology will sign off. Please call with questions. Pt will follow up with stroke clinic NP at [facility] in about 4 weeks. Thanks for the consult.  Rosalin Hawking, MD PhD Stroke Neurology 12/05/2019 6:19 PM    To contact Stroke Continuity provider, please refer to [email or web address] After hours, contact General Neurology

## 2019-12-05 NOTE — Consult Note (Signed)
Physical Medicine and Rehabilitation Consult Reason for Consult: CVA Referring Physician: Terrilee Croak, MD   HPI: Sara [name] is a 86 y.o. female with past medical/past surgical history of syncope, permanent atrial fibrillation (not on anticoag), PAD, hypertension, hyperglycemia, breast cancer, GERD, chronic diastolic patient plans to return home with her son she may not have 24/7 support.  Congestive heart failure, arthritis, anxiety, GI bleed, bilateral THA, cholecystectomy, hysterectomy presented on 12/05/19 with right hemiparesis and dysarthria.  History taken from chart review and son. In the ED she was noted to have weakness and failed swallow evaluation. CT head negative. ECG revealed A fib with RVR and she was started on Cardizem infusion. Neurology was consulted and MRI was ordered.  MRI personally reviewed, showing multiple left brain infarcts. Neuro recommended initating statin with further workup.  In addition to Eliquis H post stroke and stop aspirin neurology recommended long-term BP goal of 130-150 given significant left ICA stenosis.  Hospital course further complicated by hypokalemia.  Echocardiogram ejection fraction 65% with no wall abnormalities.  Mild left ventricular hypertrophy.  Unable to assess left ventricular dysfunction.  Mildly reduced right ventricular systolic function.  Severely elevated pulmonary artery pressure.  Mild/moderate tricuspid regurg.  Patient with associated right-sided weakness.   Review of Systems  Constitutional: Negative for chills and fever.  HENT: Positive for hearing loss.   Eyes: Negative.   Respiratory: Negative.   Cardiovascular: Negative.   Musculoskeletal: Positive for joint pain and myalgias.  Neurological: Positive for focal weakness and weakness. Negative for speech change.  All other systems reviewed and are negative.  Past Medical History:  Diagnosis Date  . Ankle edema    Mild, which is intermittent, usually mildly  dependent, left slightly greater than the right.  . Anxiety   . Arthritis    In her knees  . Balance problem    Due to weakened knee  . Chronic diastolic heart failure (HCC)    Class I-II -- exacerbated by Afib RVR  . Corneal edema    Severe corneal edema in right eye with multiple folds. Developed after cataract surgery 05/03/09  . Dysrhythmia    a-fib    since aghe 86 yrs old  . GERD (gastroesophageal reflux disease)   . Hematuria    Resolved after coumadin was stopped  . Hiatal hernia    With GERD  . History of breast cancer   . HOH (hard of hearing)   . Hyperglycemia    Random, without any history of diabetes  . Hyperlipidemia   . Hypertension    Difficult to control. Renal Doppler 06/02/10 showed less that 60% bilateral renal artery narrowing.  . Hypokalemia   . PAD (peripheral artery disease) ([place])   . Permanent atrial fibrillation (HCC)    Rate control.  ASA.  NOT on DOAC or warfarin due to concern for falls &bleeding.  Marland Kitchen Posthemorrhagic anemia   . Pseudophakia   . Syncope 02/22/11   During OV on 02/22/11 her INR was 7.3 and was given 2.5 mg of vitamin K. Later that day she became diaphoretic & fainted.  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 ABDOMINAL HYSTERECTOMY    . BREAST BIOPSY     3 biopsies  . CARDIOVERSION N/A 03/31/2016   Procedure: CARDIOVERSION;  Surgeon: Jerline Pain, MD;  Location: [facility] ENDOSCOPY;  Service: Cardiovascular: Successful cardioversion from A. fib to sinus rhythm  .  CATARACT EXTRACTION  05/03/09  . CHOLECYSTECTOMY    . EYE SURGERY     cataract  . HEMORRHOID SURGERY    . LOWER EXTREMITY ANGIOGRAPHY Right 03/18/2018   Procedure: LOWER EXTREMITY ANGIOGRAPHY;  Surgeon: Lorretta Harp, MD;  Location: [place] CV LAB;  Service: Cardiovascular;  Laterality: Right;  . PERIPHERAL VASCULAR INTERVENTION  03/18/2018   Procedure: PERIPHERAL VASCULAR INTERVENTION;  Surgeon: Lorretta Harp, MD;  Location: [place] CV LAB;  Service: Cardiovascular;;  left SFA  . Remote hysterectomy    . TEE WITHOUT CARDIOVERSION N/A 03/31/2016   Procedure: TRANSESOPHAGEAL ECHOCARDIOGRAM (TEE);  Surgeon: Jerline Pain, MD;  Location: [place];  Service: Cardiovascular:  EF 55-60%. No vegetation or thrombus okay for DC CV  . TONSILLECTOMY    . TOTAL KNEE ARTHROPLASTY Right 08/26/10   By Dr. Elta Guadeloupe C. Yates. Cemented, computer assist  . TOTAL KNEE ARTHROPLASTY Left 07/08/2018   Procedure: LEFT TOTAL KNEE ARTHROPLASTY CEMENTED;  Surgeon: Marybelle Killings, MD;  Location: [place];  Service: Orthopedics;  Laterality: Left;  . TRANSTHORACIC ECHOCARDIOGRAM  03/2016    Mild LVH. EF 65-70%. High LVEDP no RWMA. Severe LA dilation. Peak PAP ~ 37 mmHg   Family History  Problem Relation Age of Onset  . Cancer Mother        Mouth  . Hypertension Mother   . Coronary artery disease Brother   . Hypertension Brother   . Diabetes type II Brother   . Heart attack Sister   . Hypertension Sister    Social History:  reports that she has never smoked. She has never used smokeless tobacco. She reports that she does not drink alcohol and does not use drugs. Allergies:  Allergies  Allergen Reactions  . Amiodarone Shortness Of Breath and Other (See Comments)    Stopped in Feb 2017 secondary to pulmonary complications  . Aliskiren Other (See Comments) and Nausea Only    unknown  . Amlodipine Besylate-Valsartan Other (See Comments) and Nausea Only    Unclear  . Bystolic [Nebivolol Hcl] Other (See Comments)    unknown  . Clonidine Derivatives Other (See Comments)    unknown  . Dronedarone Nausea And Vomiting and Nausea Only  . Hctz [Hydrochlorothiazide] Other (See Comments)    Currently taking without problem; question if this has to do with hypokalemia  . Hydralazine Other (See Comments)    Also tolerated, but had orthostatic changes on standing medication  . Lisinopril Other (See Comments) and Nausea Only     unknown  . Nebivolol Nausea Only  . Olmesartan Nausea Only and Other (See Comments)    Unclear of the intolerance  . Oxycodone     Patient states decreased heart rate, decreased blood pressure. List as allergy.  . Rythmol [Propafenone] Other (See Comments)    unknown  . Statins Other (See Comments) and Nausea Only    unknown  . Carvedilol Other (See Comments) and Nausea And Vomiting    fatigue  . Prednisone Nausea And Vomiting and Other (See Comments)    Jittery    Medications Prior to Admission  Medication Sig Dispense Refill  . aspirin EC 81 MG tablet Take 81 mg by mouth daily.    . carvedilol (COREG) 12.5 MG tablet Take 1.5 tablets (18.75 mg total) by mouth 2 (two) times daily. 270 tablet 2  . furosemide (LASIX) 40 MG tablet Take 80 mg in the mornings and 40 mg in the evenings (Patient taking differently: Take 40-80 mg  by mouth See admin instructions. Take 80 mg in the mornings and 40 mg in the evenings) 270 tablet 1  . potassium chloride SA (KLOR-CON) 20 MEQ tablet Take 2 tablets (40 mEq total) by mouth 2 (two) times daily. (Patient taking differently: Take 20 mEq by mouth 2 (two) times daily. ) 360 tablet 0  . rOPINIRole (REQUIP) 1 MG tablet Take 1 tablet by mouth at bedtime.   3  . clopidogrel (PLAVIX) 75 MG tablet TAKE 1 TABLET BY MOUTH ONCE (1) DAILY WITH BREAKFAST (Patient not taking: Reported on 12/05/2019) 90 tablet 2    Home: [place] expects to be discharged to:: Private residence Living Arrangements: Children Available Help at Discharge: Family Type of Home: House Home Access: [place]: One level Bathroom Shower/Tub: Chiropodist: Handicapped height Home Equipment: Environmental consultant - 2 wheels, Wheelchair - manual, Radio producer - single point Additional Comments: pt lives with son who is currently not working  Lives With: Son  Functional History: Prior Function Level of Independence: Independent with assistive  device(s) Functional Status:  Mobility: Bed Mobility Overal bed mobility: Needs Assistance Bed Mobility: Supine to Sit, Sit to Supine Supine to sit: Max assist Sit to supine: Max assist General bed mobility comments: max assist to transition legs off EOB and elevate trunk. Return to supine with assist to control trunk and legs and total +2 to slide toward HOB. Transfers General transfer comment: unsafe to attempt without +2 assist      ADL:    Cognition: Cognition Overall Cognitive Status: Impaired/Different from baseline Arousal/Alertness: Awake/alert Orientation Level: Oriented X4 Attention: Sustained, Selective Sustained Attention: Appears intact Selective Attention: Impaired Selective Attention Impairment: Verbal basic Memory: Impaired Memory Impairment: Retrieval deficit Awareness: Impaired Problem Solving: Impaired Problem Solving Impairment: Verbal basic Safety/Judgment: Impaired Cognition Arousal/Alertness: Lethargic Behavior During Therapy: WFL for tasks assessed/performed Overall Cognitive Status: Impaired/Different from baseline Area of Impairment: Following commands, Safety/judgement, Orientation Orientation Level: Time Following Commands: Follows one step commands inconsistently Safety/Judgement: Decreased awareness of deficits General Comments: pt oriented to day not month, pt unaware of position of RUE in bed and sitting. Pt initially stating son works then reported he has been out of work since covid  Blood pressure 116/78, pulse 83, temperature 98.4 F (36.9 C), temperature source Oral, resp. rate (!) 23, height 5' (1.524 m), weight 68.6 kg, SpO2 96 %. Physical Exam  Vitals reviewed. Constitutional: She is cooperative. No distress.  HENT:  Head: Normocephalic and atraumatic.  Right Ear: External ear normal.  Left Ear: External ear normal.  Nose: Nose normal.  Eyes: Right eye exhibits no discharge. Left eye exhibits no discharge.  Respiratory:  Effort normal. No stridor. No respiratory distress.  GI: Normal appearance. She exhibits no distension.  Musculoskeletal:     Cervical back: Normal range of motion.     Comments: No edema or tenderness in extremities  Neurological: She is alert.  Motor: RUE/RLE: 0/5 proximal distal LUE/LLE: 5/5 proximal distal Sensation intact light touch HOH  Skin: Skin is warm and dry.  Psychiatric: She has a flat affect.    Results for orders placed or performed during the hospital encounter of 12/05/19 (from the past 24 hour(s))  CBG monitoring, ED     Status: Abnormal   Collection Time: 12/05/19  2:28 AM  Result Value Ref Range   Glucose-Capillary 104 (H) 70 - 99 mg/dL  Ethanol     Status: None   Collection Time: 12/05/19  2:30 AM  Result Value  Ref Range   Alcohol, Ethyl (B) <10 <10 mg/dL  Protime-INR     Status: None   Collection Time: 12/05/19  2:30 AM  Result Value Ref Range   Prothrombin Time 13.7 11.4 - 15.2 seconds   INR 1.1 0.8 - 1.2  APTT     Status: None   Collection Time: 12/05/19  2:30 AM  Result Value Ref Range   aPTT 32 24 - 36 seconds  CBC     Status: Abnormal   Collection Time: 12/05/19  2:30 AM  Result Value Ref Range   WBC 7.7 4.0 - 10.5 K/uL   RBC 5.15 (H) 3.87 - 5.11 MIL/uL   Hemoglobin 15.0 12.0 - 15.0 g/dL   HCT 46.2 (H) 36 - 46 %   MCV 89.7 80.0 - 100.0 fL   MCH 29.1 26.0 - 34.0 pg   MCHC 32.5 30.0 - 36.0 g/dL   RDW 13.8 11.5 - 15.5 %   Platelets 205 150 - 400 K/uL   nRBC 0.0 0.0 - 0.2 %  Differential     Status: None   Collection Time: 12/05/19  2:30 AM  Result Value Ref Range   Neutrophils Relative % 70 %   Neutro Abs 5.4 1.7 - 7.7 K/uL   Lymphocytes Relative 19 %   Lymphs Abs 1.5 0.7 - 4.0 K/uL   Monocytes Relative 7 %   Monocytes Absolute 0.5 0 - 1 K/uL   Eosinophils Relative 3 %   Eosinophils Absolute 0.2 0 - 0 K/uL   Basophils Relative 1 %   Basophils Absolute 0.1 0 - 0 K/uL   Immature Granulocytes 0 %   Abs Immature Granulocytes 0.02 0.00 -  0.07 K/uL  Comprehensive metabolic panel     Status: Abnormal   Collection Time: 12/05/19  2:30 AM  Result Value Ref Range   Sodium 142 135 - 145 mmol/L   Potassium 3.4 (L) 3.5 - 5.1 mmol/L   Chloride 109 98 - 111 mmol/L   CO2 24 22 - 32 mmol/L   Glucose, Bld 124 (H) 70 - 99 mg/dL   BUN 19 8 - 23 mg/dL   Creatinine, Ser 0.78 0.44 - 1.00 mg/dL   Calcium 9.4 8.9 - 10.3 mg/dL   Total Protein 7.0 6.5 - 8.1 g/dL   Albumin 3.8 3.5 - 5.0 g/dL   AST 24 15 - 41 U/L   ALT 15 0 - 44 U/L   Alkaline Phosphatase 77 38 - 126 U/L   Total Bilirubin 1.3 (H) 0.3 - 1.2 mg/dL   GFR calc non Af Amer >60 >60 mL/min   GFR calc Af Amer >60 >60 mL/min   Anion gap 9 5 - 15  I-stat chem 8, ED     Status: Abnormal   Collection Time: 12/05/19  2:35 AM  Result Value Ref Range   Sodium 145 135 - 145 mmol/L   Potassium 3.0 (L) 3.5 - 5.1 mmol/L   Chloride 108 98 - 111 mmol/L   BUN 20 8 - 23 mg/dL   Creatinine, Ser 0.80 0.44 - 1.00 mg/dL   Glucose, Bld 124 (H) 70 - 99 mg/dL   Calcium, Ion 1.14 (L) 1.15 - 1.40 mmol/L   TCO2 24 22 - 32 mmol/L   Hemoglobin 14.6 12.0 - 15.0 g/dL   HCT 43.0 36 - 46 %  SARS Coronavirus 2 by RT PCR (hospital order, performed in [place] hospital lab) Nasopharyngeal Nasopharyngeal Swab     Status: None   Collection  Time: 12/05/19  2:49 AM   Specimen: Nasopharyngeal Swab  Result Value Ref Range   SARS Coronavirus 2 NEGATIVE NEGATIVE  Creatinine, serum     Status: None   Collection Time: 12/05/19  7:51 AM  Result Value Ref Range   Creatinine, Ser 0.70 0.44 - 1.00 mg/dL   GFR calc non Af Amer >60 >60 mL/min   GFR calc Af Amer >60 >60 mL/min  TSH     Status: None   Collection Time: 12/05/19  7:51 AM  Result Value Ref Range   TSH 1.769 0.350 - 4.500 uIU/mL  Troponin I (High Sensitivity)     Status: Abnormal   Collection Time: 12/05/19  7:51 AM  Result Value Ref Range   Troponin I (High Sensitivity) 19 (H) <18 ng/L  Lipid panel     Status: Abnormal   Collection Time:  12/05/19  7:51 AM  Result Value Ref Range   Cholesterol 182 0 - 200 mg/dL   Triglycerides 58 <150 mg/dL   HDL 44 >40 mg/dL   Total CHOL/HDL Ratio 4.1 RATIO   VLDL 12 0 - 40 mg/dL   LDL Cholesterol 126 (H) 0 - 99 mg/dL  CBC     Status: None   Collection Time: 12/05/19 10:57 AM  Result Value Ref Range   WBC 6.5 4.0 - 10.5 K/uL   RBC 5.04 3.87 - 5.11 MIL/uL   Hemoglobin 14.9 12.0 - 15.0 g/dL   HCT 45.2 36 - 46 %   MCV 89.7 80.0 - 100.0 fL   MCH 29.6 26.0 - 34.0 pg   MCHC 33.0 30.0 - 36.0 g/dL   RDW 13.9 11.5 - 15.5 %   Platelets 211 150 - 400 K/uL   nRBC 0.0 0.0 - 0.2 %  Glucose, capillary     Status: None   Collection Time: 12/05/19 11:08 AM  Result Value Ref Range   Glucose-Capillary 83 70 - 99 mg/dL   MR ANGIO HEAD WO CONTRAST  Result Date: 12/05/2019 CLINICAL DATA:  86 year old female code stroke presentation. Slurred speech and weakness. Scattered left MCA cortical infarcts and subtle left MCA/ACA watershed involvement on MRI today. EXAM: MRA HEAD WITHOUT CONTRAST TECHNIQUE: Angiographic images of the Circle of Willis were obtained using MRA technique without intravenous contrast. COMPARISON:  MRI today reported separately. FINDINGS: Mildly degraded by motion artifact despite repeated imaging attempts. Antegrade flow in the posterior circulation with dominant appearing left vertebral artery and extensive irregularity and stenosis of the distal right vertebral artery. Tandem high-grade right V4 stenoses, but the right vertebrobasilar junction is patent. There is a mild to moderate stenosis also at the left vertebrobasilar junction. Irregular and somewhat diminutive basilar artery. No high-grade basilar stenosis. Patent basilar tip. Fetal type right PCA origin. Tortuous left P1 segment. Bilateral PCA branches are mildly to moderately irregular, including both P2 segments. Antegrade flow in both ICA siphons. Bilateral siphon irregularity in keeping with atherosclerosis but there is  asymmetric signal loss in the left supraclinoid ICA (series 1039, image 14) suggesting high-grade stenosis there. Carotid termini remain patent. MCA and ACA origins are patent. Anterior communicating artery and proximal A2 segments appear normal. The visible left ACA remains patent, with only mild left A2 stenosis. Right MCA bifurcates early without stenosis. No right MCA branch occlusion identified. Left MCA M1 and left MCA bifurcation appear patent without convincing stenosis. No left MCA branch occlusion is evident. IMPRESSION: 1. Negative for large vessel occlusion, but positive for extensive intracranial atherosclerosis: 2. Suspected High-grade distal  Left ICA stenosis in the supraclinoid segment. But no significant Left MCA or ACA stenosis or branch occlusion is identified. 3. Severely stenotic non dominant distal Right Vertebral Artery, with up to moderate stenosis at the junction of the dominant Left Vertebrobasilar junction. Electronically Signed   By: Genevie Ann M.D.   On: 12/05/2019 06:43   MR BRAIN WO CONTRAST  Result Date: 12/05/2019 CLINICAL DATA:  86 year old female code stroke presentation. Slurred speech and weakness. EXAM: MRI HEAD WITHOUT CONTRAST TECHNIQUE: Multiplanar, multiecho pulse sequences of the brain and surrounding structures were obtained without intravenous contrast. COMPARISON:  Head CT earlier this morning. [facility] Brain MRI 01/28/2015. FINDINGS: Brain: Positive for multiple scattered areas of mostly cortical restricted diffusion in the left frontal and posterior temporal lobes. There is a somewhat confluent area of involvement at the left inferior frontal gyrus on series 7, image 62. There is also some medial left superior frontal gyrus involvement suggested on image 83. Mild posterior left insula involvement. Occasional periventricular white matter involvement. No definite contralateral or posterior fossa restricted diffusion. Subtle T2 and FLAIR hyperintensity in the  acutely affected areas with no hemorrhage or mass effect. Elsewhere stable since 2016 gray and white matter signal throughout the brain including increased perivascular spaces and mild for age nonspecific white matter and thalamic T2 heterogeneity. No chronic cortical encephalomalacia or chronic cerebral blood products. No midline shift, mass effect, evidence of mass lesion, ventriculomegaly, extra-axial collection or acute intracranial hemorrhage. Cervicomedullary junction and pituitary are within normal limits. Vascular: Major intracranial vascular flow voids are stable since 2016. Skull and upper cervical spine: Negative for age visible cervical spine, bone marrow signal. Sinuses/Orbits: Stable since 2016 and negative. Other: Stable mild right mastoid effusion. Visible internal auditory structures appear normal. Scalp and face soft tissues appear negative. IMPRESSION: 1. Positive for scattered acute infarcts in the Left MCA territory, primarily cortical. No associated hemorrhage or mass effect. Subtle involvement of the left MCA/ACA watershed. See MRA today reported separately. 2. Otherwise stable since 2016 and largely negative for age noncontrast MRI appearance of the brain. Electronically Signed   By: Genevie Ann M.D.   On: 12/05/2019 06:36   ECHOCARDIOGRAM COMPLETE  Result Date: 12/05/2019    ECHOCARDIOGRAM REPORT   Patient Name:   Sara [name] Date of Exam: 12/05/2019 Medical Rec #:  [id number]        Height:       60.0 in Accession #:    [id number]       Weight:       151.2 lb Date of Birth:  [birth year]       BSA:          1.658 m Patient Age:    58 years         BP:           126/68 mmHg Patient Gender: F                HR:           74 bpm. Exam Location:  Inpatient Procedure: 2D Echo, Cardiac Doppler and Color Doppler Indications:    Stroke  History:        Patient has prior history of Echocardiogram examinations, most                 recent 03/31/2016. CHF, Arrythmias:Atrial Fibrillation; Risk                  Factors:Hypertension. Acute CVA.  Sonographer:  Clayton Lefort RDCS (AE) Referring Phys: [id number] [place]  1. Left ventricular ejection fraction, by estimation, is 60 to 65%. The left ventricle has normal function. The left ventricle has no regional wall motion abnormalities. There is mild left ventricular hypertrophy. Left ventricular diastolic function could not be evaluated.  2. Right ventricular systolic function is mildly reduced. The right ventricular size is normal. There is severely elevated pulmonary artery systolic pressure. The estimated right ventricular systolic pressure is 73.7 mmHg.  3. Left atrial size was moderately dilated.  4. The mitral valve is normal in structure. Mild to moderate mitral valve regurgitation. No evidence of mitral stenosis.  5. The tricuspid valve is myxomatous. Tricuspid valve regurgitation is mild to moderate.  6. The aortic valve is normal in structure. Aortic valve regurgitation is mild. No aortic stenosis is present.  7. The inferior vena cava is dilated in size with <50% respiratory variability, suggesting right atrial pressure of 15 mmHg. FINDINGS  Left Ventricle: Left ventricular ejection fraction, by estimation, is 60 to 65%. The left ventricle has normal function. The left ventricle has no regional wall motion abnormalities. The left ventricular internal cavity size was normal in size. There is  mild left ventricular hypertrophy. Left ventricular diastolic function could not be evaluated due to atrial fibrillation. Left ventricular diastolic function could not be evaluated. Right Ventricle: The right ventricular size is normal. No increase in right ventricular wall thickness. Right ventricular systolic function is mildly reduced. There is severely elevated pulmonary artery systolic pressure. The tricuspid regurgitant velocity is 3.54 m/s, and with an assumed right atrial pressure of 15 mmHg, the estimated right ventricular systolic pressure is 10.6  mmHg. Left Atrium: Left atrial size was moderately dilated. Right Atrium: Right atrial size was normal in size. Pericardium: There is no evidence of pericardial effusion. Mitral Valve: The mitral valve is normal in structure. There is mild thickening of the mitral valve leaflet(s). Normal mobility of the mitral valve leaflets. Mild mitral annular calcification. Mild to moderate mitral valve regurgitation, with centrally-directed jet. No evidence of mitral valve stenosis. Tricuspid Valve: The tricuspid valve is myxomatous. Tricuspid valve regurgitation is mild to moderate. No evidence of tricuspid stenosis. Aortic Valve: The aortic valve is normal in structure. Aortic valve regurgitation is mild. Aortic regurgitation PHT measures 586 msec. No aortic stenosis is present. Aortic valve mean gradient measures 3.0 mmHg. Aortic valve peak gradient measures 6.7 mmHg. Aortic valve area, by VTI measures 1.72 cm. Pulmonic Valve: The pulmonic valve was normal in structure. Pulmonic valve regurgitation is trivial. No evidence of pulmonic stenosis. Aorta: The aortic root is normal in size and structure. Venous: The inferior vena cava is dilated in size with less than 50% respiratory variability, suggesting right atrial pressure of 15 mmHg. IAS/Shunts: No atrial level shunt detected by color flow Doppler.  LEFT VENTRICLE PLAX 2D LVIDd:         3.80 cm LVIDs:         2.60 cm LV PW:         1.40 cm LV IVS:        1.30 cm LVOT diam:     1.70 cm LV SV:         45 LV SV Index:   27 LVOT Area:     2.27 cm  RIGHT VENTRICLE            IVC RV S prime:     7.18 cm/s  IVC diam: 2.80 cm TAPSE (M-mode): 1.6 cm  LEFT ATRIUM              Index       RIGHT ATRIUM           Index LA diam:        4.30 cm  2.59 cm/m  RA Area:     20.20 cm LA Vol (A2C):   106.0 ml 63.95 ml/m RA Volume:   53.00 ml  31.97 ml/m LA Vol (A4C):   90.5 ml  54.60 ml/m LA Biplane Vol: 99.0 ml  59.73 ml/m  AORTIC VALVE AV Area (Vmax):    1.45 cm AV Area (Vmean):   1.42  cm AV Area (VTI):     1.72 cm AV Vmax:           129.00 cm/s AV Vmean:          86.000 cm/s AV VTI:            0.262 m AV Peak Grad:      6.7 mmHg AV Mean Grad:      3.0 mmHg LVOT Vmax:         82.40 cm/s LVOT Vmean:        53.700 cm/s LVOT VTI:          0.198 m LVOT/AV VTI ratio: 0.76 AI PHT:            586 msec  AORTA Ao Root diam: 3.20 cm Ao Asc diam:  3.20 cm TRICUSPID VALVE TR Peak grad:   50.1 mmHg TR Vmax:        354.00 cm/s  SHUNTS Systemic VTI:  0.20 m Systemic Diam: 1.70 cm Dani Gobble Croitoru MD Electronically signed by Sanda Klein MD Signature Date/Time: 12/05/2019/4:55:37 PM    Final    CT HEAD CODE STROKE WO CONTRAST  Result Date: 12/05/2019 CLINICAL DATA:  Code stroke. Initial evaluation for acute slurred speech, weakness. EXAM: CT HEAD WITHOUT CONTRAST TECHNIQUE: Contiguous axial images were obtained from the base of the skull through the vertex without intravenous contrast. COMPARISON:  Prior study from 08/21/2015. FINDINGS: Brain: Generalized age-related cerebral atrophy with mild chronic small vessel ischemic disease. No acute intracranial hemorrhage. No acute large vessel territory infarct. No mass lesion, midline shift or mass effect. No hydrocephalus or extra-axial fluid collection. Vascular: No hyperdense vessel. Calcified atherosclerosis present at the skull base. Skull: Scalp soft tissues and calvarium within normal limits. Sinuses/Orbits: Globes and orbital soft tissues within normal limits. Paranasal sinuses are clear. No mastoid effusion. Other: None. ASPECTS [facility] Stroke Program Early CT Score) - Ganglionic level infarction (caudate, lentiform nuclei, internal capsule, insula, M1-M3 cortex): 7 - Supraganglionic infarction (M4-M6 cortex): 3 Total score (0-10 with 10 being normal): 10 IMPRESSION: 1. No acute intracranial infarct or other abnormality. 2. ASPECTS is 10. 3. Mild age-related cerebral atrophy with chronic small vessel ischemic disease. These results were communicated to  Dr. Cheral Marker at 2:47 amon 6/18/2021by text page via the [facility] messaging system. Electronically Signed   By: Jeannine Boga M.D.   On: 12/05/2019 02:48   VAS US CAROTID  Result Date: 12/05/2019 Carotid Arterial Duplex Study Indications:       CVA. Risk Factors:      Hypertension, hyperlipidemia. Other Factors:     Scattered acute infarcts in the Left MCA. Comparison Study:  none Performing Technologist: June Leap RDMS, RVT  Examination Guidelines: A complete evaluation includes B-mode imaging, spectral Doppler, color Doppler, and power Doppler as needed of all accessible portions of each vessel. Bilateral testing  is considered an integral part of a complete examination. Limited examinations for reoccurring indications may be performed as noted.  Right Carotid Findings: +----------+--------+--------+--------+------------------+---------+           PSV cm/sEDV cm/sStenosisPlaque DescriptionComments  +----------+--------+--------+--------+------------------+---------+ CCA Prox  98      13                                          +----------+--------+--------+--------+------------------+---------+ CCA Distal83      20                                          +----------+--------+--------+--------+------------------+---------+ ICA Prox  99      28      1-39%   calcific          Shadowing +----------+--------+--------+--------+------------------+---------+ ICA Distal82      18                                          +----------+--------+--------+--------+------------------+---------+ ECA       109     16                                          +----------+--------+--------+--------+------------------+---------+ +----------+--------+-------+----------------+-------------------+           PSV cm/sEDV cmsDescribe        Arm Pressure (mmHG) +----------+--------+-------+----------------+-------------------+ [id number]            Multiphasic, WNL                     +----------+--------+-------+----------------+-------------------+ +---------+--------+--+--------+-+---------+ VertebralPSV cm/s28EDV cm/s3Antegrade +---------+--------+--+--------+-+---------+  Left Carotid Findings: +----------+--------+--------+--------+------------------+---------+           PSV cm/sEDV cm/sStenosisPlaque DescriptionComments  +----------+--------+--------+--------+------------------+---------+ CCA Prox  80      17                                          +----------+--------+--------+--------+------------------+---------+ CCA Distal70      13                                          +----------+--------+--------+--------+------------------+---------+ ICA Prox  143     26      1-39%   calcific          Shadowing +----------+--------+--------+--------+------------------+---------+ ICA Distal103     24                                          +----------+--------+--------+--------+------------------+---------+ ECA       158     23                                          +----------+--------+--------+--------+------------------+---------+ +----------+--------+--------+----------------+-------------------+  PSV cm/sEDV cm/sDescribe        Arm Pressure (mmHG) +----------+--------+--------+----------------+-------------------+ [id number]              Multiphasic, WNL                    +----------+--------+--------+----------------+-------------------+ +---------+--------+--+--------+--+---------+ VertebralPSV cm/s56EDV cm/s14Antegrade +---------+--------+--+--------+--+---------+   Summary: Right Carotid: Velocities in the right ICA are consistent with a 1-39% stenosis. Left Carotid: Velocities in the left ICA are consistent with a 1-39% stenosis.               Upper end of range.  *See table(s) above for measurements and observations.     Preliminary     Assessment/Plan: Diagnosis: Left brain  infarcts Stroke: Continue secondary stroke prophylaxis and Risk Factor Modification listed below:   Antiplatelet therapy:   Blood Pressure Management:  Continue current medication with prn's with permisive HTN per primary team Statin Agent:   Right sided hemiparesis: fit for orthosis to prevent contractures (resting hand splint for day, wrist cock up splint at night, PRAFO, etc) PT/OT for mobility, ADL training  Motor recovery: Fluoxetine Labs and images (see above) independently reviewed.  Records reviewed and summated above.  1. Does the need for close, 24 hr/day medical supervision in concert with the patient's rehab needs make it unreasonable for this patient to be served in a less intensive setting? Yes  2. Co-Morbidities requiring supervision/potential complications: syncope, permanent atrial fibrillation (initiate Eliquis, monitor with increased exertion), PAD, HTN (monitor and provide prns in accordance with increased physical exertion and pain),hyperglycemia, breast cancer, GERD, chronic diastolic congestive heart failure (monitor weights, monitor for signs of fluid overload (,, arthritis, anxiety, GI bleed (follow hemoglobin), bilateral THA 3. Due to bladder management, bowel management, safety, skin/wound care, disease management, medication administration and patient education, does the patient require 24 hr/day rehab nursing? Yes 4. Does the patient require coordinated care of a physician, rehab nurse, PT (1-2 hrs/day, 1-2 days/week), OT (1-2 hrs/day, 5 days/week) and SLP (1-2 hrs/day, 5 days/week) to address physical and functional deficits in the context of the above medical diagnosis(es)? Yes Addressing deficits in the following areas: balance, endurance, locomotion, strength, transferring, bowel/bladder control, bathing, dressing, feeding, grooming, toileting, cognition, swallowing and psychosocial support 5. Can the patient actively participate in an intensive therapy program of at  least 3 hrs of therapy per day at least 5 days per week? Yes 6. The potential for patient to make measurable gains while on inpatient rehab is excellent 7. Anticipated functional outcomes upon discharge from inpatient rehab are supervision and min assist  with PT, supervision and min assist with OT, modified independent and supervision with SLP. 8. Estimated rehab length of stay to reach the above functional goals is: 18-23 days. 9. Anticipated D/C setting: Other 10. Anticipated post D/C treatments: HH therapy and Home excercise program 11. Overall Rehab/Functional Prognosis: good  RECOMMENDATIONS: This patient's condition is appropriate for continued rehabilitative care in the following setting: Recommend CIR, however son reports that patient will not have 24/7 support at discharge.  He states that it has been [facility] in [place], which is closer to their home and prefers this discharge disposition.  Patient in agreement.  Patient has agreed to participate in recommended program. Yes Note that insurance prior authorization may be required for reimbursement for recommended care.  Comment: Rehab Admissions Coordinator to follow up.   Delice Lesch, MD, Maxine Glenn 12/05/2019

## 2019-12-05 NOTE — ED Notes (Signed)
Paged admitting pt calling out complaining of sudden right lower leg pain. Sensation intact. Pain described as pulsating.

## 2019-12-05 NOTE — Evaluation (Addendum)
Clinical/Bedside Swallow Evaluation Patient Details  Name: Sara [name] MRN: [id number] Date of Birth: 1925-10-19  Today's Date: 12/05/2019 Time: SLP Start Time (ACUTE ONLY): 1215 SLP Stop Time (ACUTE ONLY): 1225 SLP Time Calculation (min) (ACUTE ONLY): 10 min  Past Medical History:  Past Medical History:  Diagnosis Date  . Ankle edema    Mild, which is intermittent, usually mildly dependent, left slightly greater than the right.  . Anxiety   . Arthritis    In her knees  . Balance problem    Due to weakened knee  . Chronic diastolic heart failure (HCC)    Class I-II -- exacerbated by Afib RVR  . Corneal edema    Severe corneal edema in right eye with multiple folds. Developed after cataract surgery 05/03/09  . Dysrhythmia    a-fib    since aghe 86 yrs old  . GERD (gastroesophageal reflux disease)   . Hematuria    Resolved after coumadin was stopped  . Hiatal hernia    With GERD  . History of breast cancer   . HOH (hard of hearing)   . Hyperglycemia    Random, without any history of diabetes  . Hyperlipidemia   . Hypertension    Difficult to control. Renal Doppler 06/02/10 showed less that 60% bilateral renal artery narrowing.  . Hypokalemia   . PAD (peripheral artery disease) ([place])   . Permanent atrial fibrillation (HCC)    Rate control.  ASA.  NOT on DOAC or warfarin due to concern for falls &bleeding.  Marland Kitchen Posthemorrhagic anemia   . Pseudophakia   . Syncope 02/22/11   During OV on 02/22/11 her INR was 7.3 and was given 2.5 mg of vitamin K. Later that day she became diaphoretic & fainted.  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 ABDOMINAL HYSTERECTOMY    . BREAST BIOPSY     3 biopsies  . CARDIOVERSION N/A 03/31/2016   Procedure: CARDIOVERSION;  Surgeon: Jerline Pain, MD;  Location: [facility] ENDOSCOPY;  Service: Cardiovascular: Successful cardioversion from A. fib to sinus rhythm  .  CATARACT EXTRACTION  05/03/09  . CHOLECYSTECTOMY    . EYE SURGERY     cataract  . HEMORRHOID SURGERY    . LOWER EXTREMITY ANGIOGRAPHY Right 03/18/2018   Procedure: LOWER EXTREMITY ANGIOGRAPHY;  Surgeon: Lorretta Harp, MD;  Location: [place] CV LAB;  Service: Cardiovascular;  Laterality: Right;  . PERIPHERAL VASCULAR INTERVENTION  03/18/2018   Procedure: PERIPHERAL VASCULAR INTERVENTION;  Surgeon: Lorretta Harp, MD;  Location: [employer] CV LAB;  Service: Cardiovascular;;  left SFA  . Remote hysterectomy    . TEE WITHOUT CARDIOVERSION N/A 03/31/2016   Procedure: TRANSESOPHAGEAL ECHOCARDIOGRAM (TEE);  Surgeon: Jerline Pain, MD;  Location: [place];  Service: Cardiovascular:  EF 55-60%. No vegetation or thrombus okay for DC CV  . TONSILLECTOMY    . TOTAL KNEE ARTHROPLASTY Right 08/26/10   By Dr. Elta Guadeloupe C. Yates. Cemented, computer assist  . TOTAL KNEE ARTHROPLASTY Left 07/08/2018   Procedure: LEFT TOTAL KNEE ARTHROPLASTY CEMENTED;  Surgeon: Marybelle Killings, MD;  Location: [place];  Service: Orthopedics;  Laterality: Left;  . TRANSTHORACIC ECHOCARDIOGRAM  03/2016    Mild LVH. EF 65-70%. High LVEDP no RWMA. Severe LA dilation. Peak PAP ~ 37 mmHg   HPI:   86 y.o. female presenting from home 6/18 via EMS as a Code Stroke for  acute onset of right sided weakness and slurred speech.  PMHx includes chronic diastolic HF, A-fib, history of breast CA, HLD, HTN and PAD. MRI Positive for scattered acute infarcts in the Left MCA territory, primarily cortical. No associated hemorrhage or mass effect. Subtle involvement of the left MCA/ACA watershed.  Assessment / Plan / Recommendation Clinical Impression  Pt presents with normal swallow function.  Oral exam unremarkable. Dentition present; no focal CN deficits.  Pt sleepy, but arousable and participatory. She demonstrated adequate mastication of regular solids; brisk swallow response; no s/s of aspiration, even with mixed solid/liquid consistencies.   Recommend resuming a regular consistency diet; meds whole in water.  No SLP f/u is needed.  SLP Visit Diagnosis: Dysphagia, unspecified (R13.10)    Aspiration Risk  No limitations    Diet Recommendation   regular solids, thin liquids  Medication Administration: Whole meds with liquid    Other  Recommendations Oral Care Recommendations: Oral care BID   Follow up Recommendations 24 hour supervision/assistance      Frequency and Duration            Prognosis        Swallow Study   General Date of Onset: 12/05/19 HPI:  86 y.o. female presenting from home 6/18 via EMS as a Code Stroke for acute onset of right sided weakness and slurred speech.  PMHx includes chronic diastolic HF, A-fib, history of breast CA, HLD, HTN and PAD. MRI Positive for scattered acute infarcts in the Left MCA territory, primarily cortical. No associated hemorrhage or mass effect. Subtle Type of Study: Bedside Swallow Evaluation Previous Swallow Assessment: none Diet Prior to this Study: NPO Temperature Spikes Noted: No Respiratory Status: Room air History of Recent Intubation: No Behavior/Cognition: Alert;Cooperative Oral Cavity Assessment: Within Functional Limits Oral Care Completed by SLP: Recent completion by staff Oral Cavity - Dentition: Adequate natural dentition Vision: Functional for self-feeding Self-Feeding Abilities: Able to feed self Patient Positioning: Upright in bed Baseline Vocal Quality: Normal Volitional Cough: Strong    Oral/Motor/Sensory Function Overall Oral Motor/Sensory Function: Within functional limits   Ice Chips Ice chips: Within functional limits   Thin Liquid Thin Liquid: Within functional limits    Nectar Thick Nectar Thick Liquid: Not tested   Honey Thick Honey Thick Liquid: Not tested   Puree Puree: Within functional limits   Solid     Solid: Within functional limits      Sara [name] Sara [name] 12/05/2019,12:37 PM  Sara [name] L. Sara [name], Sara [name] Office number [phone] Pager [phone]

## 2019-12-05 NOTE — Progress Notes (Signed)
PROGRESS NOTE  Sara [name]  DOB: 1926/05/25  PCP: Townsend Roger, MD WGN:[id number]  DOA: 12/05/2019  LOS: 0 days   Chief Complaint  Patient presents with  . Code Stroke   Brief narrative: Sara [name] is a 86 y.o. female with PMH of HTN, HLD, PAD, chronic diastolic CHF, A. fib, GI bleed, history of breast cancer. Patient presented to the ED on 12/05/2019 from home via EMS as a Code Stroke for acute onset of right sided weakness and slurred speech. Son dropped her off at home at 1 PM on 6/17 following a visit to her hairdresser. She was normal until 5 PM, when she abruptly became weak on the right and dropped the food she was cooking. When her son arrived home at 6 PM, he noticed slurred speech. After symptoms did not resolve in the next several hours, he called EMS at about 1:45 AM. On arrival by EMS, they noticed mild slurring of speech, but no focal weakness.  Vitals en route: CBG 133, BP 160s/100, O2 sat 95% on RA, A-fib on rhythm strip. She takes ASA at home. She was taken off Plavix 3 weeks ago for a GIB. Not on an anticoagulant. She has no prior history of stroke.   In the ER on exam patient has 3/5 strength in the right lower extremity and has decreased grip strength on the right upper extremity.  Patient failed swallow evaluation.   Seen by neurologist in ED. CT head was unremarkable. MRI brain was positive for scattered acute infarcts in the Left MCA territory, primarily cortical. No associated hemorrhage or mass effect. Subtle involvement of the left MCA/ACA watershed. MRA head and neck negative for large vessel occlusion, but positive for extensive intracranial atherosclerosis. Suspected High-grade distal Left ICA stenosis in the supraclinoid Segment. But no significant Left MCA or ACA stenosis or branch occlusion is identified. Severely stenotic non dominant distal Right Vertebral Artery, with up to moderate stenosis at the junction of the dominant Left Vertebrobasilar  junction. EKG showed A. fib with RVR for which patient was started on Cardizem infusion.   Patient was admitted under hospitalist service for stroke work-up   Subjective: Patient was seen and examined this morning. Lying on bed.  Opens eyes on verbal command.  She states he is tired because of being awake last night. Chart reviewed. Heart rate in 70s and 80s, blood pressure in 140s mostly. Morning labs with potassium level low at 3  Assessment/Plan: Acute stroke -Presented with acute onset right-sided weakness and dysarthria.  Outside of TPA window -MRI brain positive for multiple scattered acute infarcts in the left MCA territory -Pending echo, carotid duplex, PT/OT/ST eval -Lipid panel with LDL 126, pending A1c, TSH normal. -Continue neurochecks. -Modified permissive hypertension protocol given advanced age.  Treat if SBP more than 180. -Continue aspirin.  A. fib with RVR -Was started on Cardizem drip on admission. -Resumed Coreg this morning -Not on anticoagulation because of risk of fall and recent GI bleeding. -Pending echocardiogram.  History of diastolic CHF Essential hypertension -Home meds include Coreg 18.75 mg twice daily, Lasix 80 mg a.m. and 40 mg p.m. with potassium supplement -I would resume Coreg and keep Lasix on hold.  Restless legs -Resume Requip  Mobility: PT/OT eval Code Status:   Code Status: Full Code per chart DVT prophylaxis:  Lovenox subcu Antimicrobials:  None Fluid: None  Diet: N.p.o. for now Daily speech evaluation Diet Order  Diet NPO time specified  Diet effective now               Consultants: Neurology Family Communication:  None at bedside  Status is: Inpatient Remains inpatient appropriate because:Ongoing diagnostic testing needed not appropriate for outpatient work up  Dispo: The patient is from: Home              Anticipated d/c is to: Home              Anticipated d/c date is: 2 days              Patient  currently is not medically stable to d/c.        Infusions:  . sodium chloride    . diltiazem (CARDIZEM) infusion 5 mg/hr (12/05/19 0617)    Scheduled Meds: .  stroke: mapping our early stages of recovery book   Does not apply Once  . aspirin  300 mg Rectal Daily   Or  . aspirin  325 mg Oral Daily  . enoxaparin (LOVENOX) injection  40 mg Subcutaneous Daily    Antimicrobials: Anti-infectives (From admission, onward)   None      PRN meds: acetaminophen **OR** acetaminophen (TYLENOL) oral liquid 160 mg/5 mL **OR** acetaminophen   Objective: Vitals:   12/05/19 0744 12/05/19 0759  BP:    Pulse: 74 76  Resp: 19 16  Temp:    SpO2: 95% 92%   No intake or output data in the 24 hours ending 12/05/19 0822 There were no vitals filed for this visit. Weight change:  There is no height or weight on file to calculate BMI.   Physical Exam: General exam: Appears calm and comfortable.  No physical distress Skin: No rashes, lesions or ulcers. HEENT: Atraumatic, normocephalic, supple neck, no obvious bleeding Lungs: Clear to station bilaterally CVS: Rate controlled A. fib, no murmur GI/Abd soft, nontender, nondistended, bowel sound present CNS: Sleepy, wakes her name, able to follow motor follow motor commands. Psychiatry: Mood appropriate Extremities: No pedal edema, no calf tenderness  Data Review: I have personally reviewed the laboratory data and studies available.  Recent Labs  Lab 12/05/19 0230 12/05/19 0235  WBC 7.7  --   NEUTROABS 5.4  --   HGB 15.0 14.6  HCT 46.2* 43.0  MCV 89.7  --   PLT 205  --    Recent Labs  Lab 12/05/19 0230 12/05/19 0235  NA 142 145  K 3.4* 3.0*  CL 109 108  CO2 24  --   GLUCOSE 124* 124*  BUN 19 20  CREATININE 0.78 0.80  CALCIUM 9.4  --     Signed, Terrilee Croak, MD [facility] Pager: [phone] (Secure Chat preferred). 12/05/2019

## 2019-12-05 NOTE — Progress Notes (Signed)
Called for transient worsening of her right sided deficit and slurred speech. BP has been trending downwards.   A/R: Transient worsening of neurological deficit in 86 year old female with atrial fibrillation and left MCA and ACA territory scattered acute infarctions -- Most likely due to transient hypoperfusion in the setting of the high grade distal left ICA stenosis in conjunction with dropping BP.  -- Starting NS with 20 meq/L of K at 75 cc/hr. -- Continue to monitor   Electronically signed: Dr. Kerney Elbe

## 2019-12-05 NOTE — Progress Notes (Signed)
  Echocardiogram 2D Echocardiogram has been performed.  Marybelle Killings 12/05/2019, 4:38 PM

## 2019-12-05 NOTE — ED Notes (Signed)
Admitting MD at bedside.

## 2019-12-06 LAB — URINALYSIS, ROUTINE W REFLEX MICROSCOPIC
Bilirubin Urine: NEGATIVE
Glucose, UA: NEGATIVE mg/dL
Hgb urine dipstick: NEGATIVE
Ketones, ur: NEGATIVE mg/dL
Leukocytes,Ua: NEGATIVE
Nitrite: NEGATIVE
Protein, ur: NEGATIVE mg/dL
Specific Gravity, Urine: 1.027 (ref 1.005–1.030)
pH: 6 (ref 5.0–8.0)

## 2019-12-06 LAB — RAPID URINE DRUG SCREEN, HOSP PERFORMED
Amphetamines: NOT DETECTED
Barbiturates: NOT DETECTED
Benzodiazepines: NOT DETECTED
Cocaine: NOT DETECTED
Opiates: NOT DETECTED
Tetrahydrocannabinol: NOT DETECTED

## 2019-12-06 LAB — HEMOGLOBIN A1C
Hgb A1c MFr Bld: 5.7 % — ABNORMAL HIGH (ref 4.8–5.6)
Mean Plasma Glucose: 117 mg/dL

## 2019-12-06 NOTE — Progress Notes (Signed)
PROGRESS NOTE  Sara [name]  DOB: 10/22/1925  PCP: Townsend Roger, MD XIP:[id number]  Delta: 12/05/2019  LOS: 1 day   Chief Complaint  Patient presents with   Code Stroke   Brief narrative: Sara [name] is a 86 y.o. female with PMH of HTN, HLD, PAD, chronic diastolic CHF, A. fib, GI bleed, history of breast cancer. Patient presented to the ED on 12/05/2019 from home via EMS as a Code Stroke for acute onset of right sided weakness and slurred speech. Son dropped her off at home at 1 PM on 6/17 following a visit to her hairdresser. She was normal until 5 PM, when she abruptly became weak on the right and dropped the food she was cooking. When her son arrived home at 6 PM, he noticed slurred speech. After symptoms did not resolve in the next several hours, he called EMS at about 1:45 AM. On arrival by EMS, they noticed mild slurring of speech, but no focal weakness.  Vitals en route: CBG 133, BP 160s/100, O2 sat 95% on RA, A-fib on rhythm strip. She takes ASA at home. She was taken off Plavix 3 weeks ago for a GIB. Not on an anticoagulant. She has no prior history of stroke.   In the ER on exam patient has 3/5 strength in the right lower extremity and has decreased grip strength on the right upper extremity.  Patient failed swallow evaluation.   Seen by neurologist in ED. CT head was unremarkable. MRI brain was positive for scattered acute infarcts in the Left MCA territory, primarily cortical. No associated hemorrhage or mass effect. Subtle involvement of the left MCA/ACA watershed. MRA head and neck negative for large vessel occlusion, but positive for extensive intracranial atherosclerosis. Suspected High-grade distal Left ICA stenosis in the supraclinoid Segment. But no significant Left MCA or ACA stenosis or branch occlusion is identified. Severely stenotic non dominant distal Right Vertebral Artery, with up to moderate stenosis at the junction of the dominant Left Vertebrobasilar  junction. EKG showed A. fib with RVR for which patient was started on Cardizem infusion.   Patient was admitted under hospitalist service for stroke work-up   Subjective: Patient was seen and examined this morning.  Lying in bed.  Not in distress.  No new symptoms. Chart reviewed. Evidently patient had a transient worsening of her right-sided deficit and slurred speech last night. Evaluated by neurology last night.  Suspected to be secondary to transient hypoperfusion related to hypotension in the setting of high-grade distal left ICA stenosis.  Patient was started on normal saline at 75 mL/h.    Assessment/Plan: Acute stroke -Presented with acute onset right-sided weakness and dysarthria.  Outside of TPA window -MRI brain positive for multiple scattered acute infarcts in the left MCA territory -Echo 60 to 65% EF with mild LVH.   -Carotid duplex with nonsignificant stenosis bilaterally. - PT/OT/ST eval obtained.  SNF recommended. -Lipid panel with LDL 126, A1c 5.7.  TSH normal.   -Modified permissive hypertension protocol given advanced age.  Treat if SBP more than 180. -Continue aspirin and Zetia.  A. fib with RVR -Was started on Cardizem drip on admission ultimately stopped. -Coreg has been resumed. -Not on anticoagulation because of risk of fall and recent GI bleeding.  History of diastolic CHF Essential hypertension -Home meds include Coreg 18.75 mg twice daily, Lasix 80 mg a.m. and 40 mg p.m. with potassium supplement. -Echocardiogram with EF 60 to 65%, mild LVH, elevated right ventricular systolic pressure to 65. -  Patient had high-grade distal ICA stenosis and is at risk of cerebral hypoperfusion with strict blood pressure control.  Patient was hence started on normal/37/h last night.  Blood pressure is improving and is in 140s this morning.  I will stop IV fluid.  Continue to hold Lasix. -Continue Coreg.  Restless legs -Resume Requip  Mobility: PT/OT eval Code Status:    Code Status: Full Code per chart DVT prophylaxis:  Lovenox subcu Antimicrobials:  None Fluid: None  Diet: N.p.o. for now Daily speech evaluation Diet Order            Diet regular Room service appropriate? Yes with Assist; Fluid consistency: Thin  Diet effective now               Consultants: Neurology Family Communication:  None at bedside  Status is: Inpatient Remains inpatient appropriate because:Ongoing diagnostic testing needed not appropriate for outpatient work up  Dispo: The patient is from: Home              Anticipated d/c is to: SNF              Anticipated d/c date is: 2 days              Patient currently is not medically stable to d/c.   Infusions:   0.9 % NaCl with KCl 20 mEq / L 75 mL/hr at 12/06/19 0010   diltiazem (CARDIZEM) infusion Stopped (12/05/19 1507)    Scheduled Meds:  aspirin EC  325 mg Oral Daily   carvedilol  18.75 mg Oral BID   enoxaparin (LOVENOX) injection  40 mg Subcutaneous Daily   ezetimibe  10 mg Oral Daily   potassium chloride  40 mEq Oral Once   rOPINIRole  1 mg Oral QHS    Antimicrobials: Anti-infectives (From admission, onward)   None      PRN meds: acetaminophen **OR** acetaminophen (TYLENOL) oral liquid 160 mg/5 mL **OR** acetaminophen   Objective: Vitals:   12/06/19 0906 12/06/19 1142  BP: (!) 130/50 (!) 144/62  Pulse:  75  Resp:  19  Temp:  98.6 F (37 C)  SpO2: 99% 100%    Intake/Output Summary (Last 24 hours) at 12/06/2019 1528 Last data filed at 12/06/2019 1400 Gross per 24 hour  Intake 1159.1 ml  Output 250 ml  Net 909.1 ml   Filed Weights   12/05/19 1027  Weight: 68.6 kg   Weight change:  Body mass index is 29.54 kg/m.   Physical Exam: General exam: Appears calm and comfortable.  No physical distress Skin: No rashes, lesions or ulcers. HEENT: Atraumatic, normocephalic, supple neck, no obvious bleeding Lungs: Clear to auscultation bilaterally.  No crackles, no wheezing.   CVS: Rate  controlled A. fib, no murmur GI/Abd soft, nontender, nondistended, bowel sound present CNS: Sleepy, wakes her name, able to follow motor follow motor commands. Psychiatry: Mood appropriate Extremities: No pedal edema, no calf tenderness  Data Review: I have personally reviewed the laboratory data and studies available.  Recent Labs  Lab 12/05/19 0230 12/05/19 0235 12/05/19 1057  WBC 7.7  --  6.5  NEUTROABS 5.4  --   --   HGB 15.0 14.6 14.9  HCT 46.2* 43.0 45.2  MCV 89.7  --  89.7  PLT 205  --  211   Recent Labs  Lab 12/05/19 0230 12/05/19 0235 12/05/19 0751  NA 142 145  --   K 3.4* 3.0*  --   CL 109 108  --   CO2  24  --   --   GLUCOSE 124* 124*  --   BUN 19 20  --   CREATININE 0.78 0.80 0.70  CALCIUM 9.4  --   --     Signed, Terrilee Croak, MD [facility] Pager: [phone] (Secure Chat preferred). 12/06/2019

## 2019-12-06 NOTE — Plan of Care (Signed)

## 2019-12-06 NOTE — Progress Notes (Signed)
Inpatient Rehab Admissions:  Inpatient Rehab Consult received.  I met with pt at the bedside for rehabilitation assessment and to discuss goals and expectations of an inpatient rehab admission.  Pt appears to be an appropriate candidate for CIR. Pt gave me permission to call son, Patrick Jupiter.  Spoke with son, who is her primary caregiver, via phone. Due to son's job requirements, he would not be able to provide the recommended amount of supervision/assistance pt may require after d/c.  He prefers pt receive rehab at a SNF.  TOC team made aware.  I will sign off on this pt.  Signed: Gayland Curry, [place], [place] Admissions Coordinator [phone]

## 2019-12-06 NOTE — Progress Notes (Signed)
Occupational Therapy Evaluation Patient Details Name: Sara [name] MRN: [id number] DOB: Feb 15, 1926 Today's Date: 12/06/2019    History of Present Illness 86 yo admitted with right weakness outside the window for tPA with scattered Left MCA infarcts. PMHx: HTN, AFib, CHF, GIB, breast CA, HOH   Clinical Impression   Patient is very kind and motivated.  She is independent at baseline and walks with RW.  She lives with son and manages own medications and money.  Patient presenting with R hemiparesis and impaired cognition.  She was oriented and responding appropriately, though had trouble following multi step commands and processing speed was a little slow.  Patient requiring mod assist to get to EOB today and max assist to maintain seated balance.  Spontaneously moves R UE and LE, has more difficulty when asked specifically to attempt movement.  R UE shows strong potential to regain strength, having 2-/5 shoulder strength and full ROM with wrist and hand.  Requiring mod assist with UB dressing/bathing, min assist with grooming and max assist with LB ADLs at bed level.  Patient would benefit from CIR to increase function.  Will continue to follow with OT acutely to address the deficits listed below.      Follow Up Recommendations  CIR    Equipment Recommendations  Other (comment) (defer to next venue)    Recommendations for Other Services       Precautions / Restrictions Precautions Precautions: Fall Precaution Comments: right hemiparesis Restrictions Weight Bearing Restrictions: No      Mobility Bed Mobility Overal bed mobility: Needs Assistance Bed Mobility: Supine to Sit;Sit to Supine     Supine to sit: Mod assist Sit to supine: Max assist      Transfers Overall transfer level: Needs assistance               General transfer comment: unsafe to attempt without +2 assist    Balance Overall balance assessment: Needs assistance Sitting-balance support: Feet  supported;Single extremity supported Sitting balance-Leahy Scale: Poor Sitting balance - Comments: mod assist with pt with posterior right LOB in sitting Postural control: Right lateral lean;Posterior lean                                 ADL either performed or assessed with clinical judgement   ADL Overall ADL's : Needs assistance/impaired Eating/Feeding: Minimal assistance;Bed level   Grooming: Minimal assistance;Bed level   Upper Body Bathing: Bed level;Moderate assistance   Lower Body Bathing: Maximal assistance;Bed level   Upper Body Dressing : Bed level;Moderate assistance   Lower Body Dressing: Maximal assistance;Bed level   Toilet Transfer: Maximal assistance;+2 for physical assistance Toilet Transfer Details (indicate cue type and reason): clinical judgement, did not attempt stand at this time         Functional mobility during ADLs: Moderate assistance (bed mob)       Vision Baseline Vision/History: No visual deficits Patient Visual Report: No change from baseline       Perception     Praxis      Pertinent Vitals/Pain Pain Assessment: Faces Faces Pain Scale: Hurts little more Pain Location: R leg Pain Descriptors / Indicators: Aching;Sore Pain Intervention(s): Limited activity within patient's tolerance;Monitored during session;Repositioned     Hand Dominance Right   Extremity/Trunk Assessment Upper Extremity Assessment Upper Extremity Assessment: RUE deficits/detail;Generalized weakness RUE Deficits / Details: Wrist and hand strength 3/5, elbow flexion 3-/5 able to flex to 100 degrees.  Shoulder 2-/5.  Sensation reported as "different" though intact RUE Sensation: WNL ("different") RUE Coordination: decreased fine motor;decreased gross motor   Lower Extremity Assessment Lower Extremity Assessment: Defer to PT evaluation       Communication Communication Communication: HOH   Cognition Arousal/Alertness: Awake/alert Behavior  During Therapy: WFL for tasks assessed/performed Overall Cognitive Status: Impaired/Different from baseline Area of Impairment: Following commands;Problem solving                       Following Commands: Follows multi-step commands inconsistently     Problem Solving: Slow processing;Requires tactile cues General Comments: Patient needing some tactile cues for initiating movement with R UE.  She was oriented and respondes to questions appropriately.  Patient still pays own bills at home.   General Comments  3L O2 Mackey and SpO2 remained >97    Exercises     Shoulder Instructions      Home Living Family/patient expects to be discharged to:: Private residence Living Arrangements: Children Available Help at Discharge: Family Type of Home: House Home Access: Ramped entrance     Home Layout: One level     Bathroom Shower/Tub: Teacher, early years/pre: Handicapped height     Home Equipment: Environmental consultant - 2 wheels;Wheelchair - manual;Cane - single point   Additional Comments: pt lives with son who is currently not working  Lives With: Son    Prior Functioning/Environment Level of Independence: Independent with assistive device(s)        Comments: Uses RW at baseline        OT Problem List: Decreased strength;Decreased range of motion;Decreased activity tolerance;Impaired balance (sitting and/or standing);Decreased coordination;Decreased cognition;Decreased knowledge of use of DME or AE;Decreased knowledge of precautions;Impaired UE functional use;Pain      OT Treatment/Interventions: Self-care/ADL training;Therapeutic exercise;Neuromuscular education;DME and/or AE instruction;Therapeutic activities;Cognitive remediation/compensation;Balance training;Patient/family education    OT Goals(Current goals can be found in the care plan section) Acute Rehab OT Goals Patient Stated Goal: Use R hand again OT Goal Formulation: With patient Time For Goal Achievement:  12/20/19 Potential to Achieve Goals: Good  OT Frequency: Min 2X/week   Barriers to D/C:            Co-evaluation              AM-PAC OT "6 Clicks" Daily Activity     Outcome Measure Help from another person eating meals?: A Little Help from another person taking care of personal grooming?: A Little Help from another person toileting, which includes using toliet, bedpan, or urinal?: A Lot Help from another person bathing (including washing, rinsing, drying)?: A Lot Help from another person to put on and taking off regular upper body clothing?: A Lot Help from another person to put on and taking off regular lower body clothing?: A Lot 6 Click Score: 14   End of Session Equipment Utilized During Treatment: Oxygen Nurse Communication: Mobility status  Activity Tolerance: Patient tolerated treatment well Patient left: in bed;with call bell/phone within reach;with bed alarm set  OT Visit Diagnosis: Unsteadiness on feet (R26.81);Muscle weakness (generalized) (M62.81);Other symptoms and signs involving the nervous system (R29.898);Other symptoms and signs involving cognitive function;Hemiplegia and hemiparesis;Pain Hemiplegia - Right/Left: Right Hemiplegia - dominant/non-dominant: Dominant Hemiplegia - caused by: Cerebral infarction Pain - Right/Left: Right Pain - part of body: Leg                Time: 6237-6283 OT Time Calculation (min): 39 min Charges:  OT General Charges $OT  Visit: 1 Visit OT Evaluation $OT Eval Moderate Complexity: 1 Mod OT Treatments $Therapeutic Activity: 8-22 mins $Neuromuscular Re-education: 8-22 mins  August Luz, OTR/L   Phylliss Bob 12/06/2019, 1:11 PM

## 2019-12-06 NOTE — Progress Notes (Signed)
Attempted to collect urine sample via purewick, however pt has not urinated all night. Encouraged pt to void, pt unable to. Bladder scan of 365mL. MD gave one time order to in-and-out cath. Upon returning to the room pt had voided a small amount. Collected urine and performed residual bladder scan, volume now 237mL. Discussed with MD to hold off on in-and-out cath for now and monitor if fluids increase output.

## 2019-12-07 ENCOUNTER — Inpatient Hospital Stay (HOSPITAL_COMMUNITY): Payer: Medicare HMO

## 2019-12-07 MED ORDER — APIXABAN 5 MG PO TABS
5.0000 mg | ORAL_TABLET | Freq: Two times a day (BID) | ORAL | Status: DC
  Administered 2019-12-07 – 2019-12-12 (×10): 5 mg via ORAL
  Filled 2019-12-07 (×11): qty 1

## 2019-12-07 MED ORDER — HYDROXYZINE HCL 25 MG PO TABS
25.0000 mg | ORAL_TABLET | Freq: Three times a day (TID) | ORAL | Status: DC | PRN
  Administered 2019-12-07 – 2019-12-11 (×5): 25 mg via ORAL
  Filled 2019-12-07 (×5): qty 1

## 2019-12-07 MED ORDER — MELATONIN 5 MG PO TABS
5.0000 mg | ORAL_TABLET | Freq: Every evening | ORAL | Status: DC | PRN
  Administered 2019-12-07 – 2019-12-10 (×4): 5 mg via ORAL
  Filled 2019-12-07 (×4): qty 1

## 2019-12-07 NOTE — Progress Notes (Signed)
PROGRESS NOTE  Sara [name]  DOB: 14-Nov-1925  PCP: Townsend Roger, MD FBP:[id number]  DOA: 12/05/2019  LOS: 2 days   Chief Complaint  Patient presents with  . Code Stroke   Brief narrative: Sara [name] is a 86 y.o. female with PMH of HTN, HLD, PAD, chronic diastolic CHF, A. fib, GI bleed, history of breast cancer. Patient presented to the ED on 12/05/2019 from home via EMS as a Code Stroke for acute onset of right sided weakness and slurred speech. Son dropped her off at home at 1 PM on 6/17 following a visit to her hairdresser. She was normal until 5 PM, when she abruptly became weak on the right and dropped the food she was cooking. When her son arrived home at 6 PM, he noticed slurred speech. After symptoms did not resolve in the next several hours, he called EMS at about 1:45 AM. On arrival by EMS, they noticed mild slurring of speech, but no focal weakness.  Vitals en route: CBG 133, BP 160s/100, O2 sat 95% on RA, A-fib on rhythm strip. She takes ASA at home. She was taken off Plavix 3 weeks ago for a GIB. Not on an anticoagulant. She has no prior history of stroke.   In the ER on exam patient has 3/5 strength in the right lower extremity and has decreased grip strength on the right upper extremity.  Patient failed swallow evaluation.   Seen by neurologist in ED. CT head was unremarkable. MRI brain was positive for scattered acute infarcts in the Left MCA territory, primarily cortical. No associated hemorrhage or mass effect. Subtle involvement of the left MCA/ACA watershed. MRA head and neck negative for large vessel occlusion, but positive for extensive intracranial atherosclerosis. Suspected High-grade distal Left ICA stenosis in the supraclinoid Segment. But no significant Left MCA or ACA stenosis or branch occlusion is identified. Severely stenotic non dominant distal Right Vertebral Artery, with up to moderate stenosis at the junction of the dominant Left Vertebrobasilar  junction. EKG showed A. fib with RVR for which patient was started on Cardizem infusion.   Patient was admitted under hospitalist service for stroke work-up   Subjective: Patient was seen and examined this morning.  Lying in bed.  Not in distress.   Patient is concerned today that she noticed any weakness on her right lower extremity since yesterday afternoon.  I was not notified of this yesterday.    Assessment/Plan: Acute stroke -Presented with acute onset right-sided weakness and dysarthria.  Outside of TPA window -MRI brain positive for multiple scattered acute infarcts in the left MCA territory -6/20, patient complains of new right lower extremity weakness since the other day.  Repeat CT scan of head was obtained.  Did not show any new infarct.  It showed evolving nonhemorrhagic infarct from the previous insult. -Currently on aspirin and Zetia.  Neurology reconsulted. -Echo 60 to 65% EF with mild LVH.   -Carotid duplex with nonsignificant stenosis bilaterally. -PT/OT/ST eval obtained.  SNF recommended. -Lipid panel with LDL 126, A1c 5.7.  TSH normal.    A. fib with RVR -Was started on Cardizem drip on admission ultimately stopped. -Coreg has been resumed. -Not on anticoagulation because of risk of fall and recent GI bleeding.  History of diastolic CHF Essential hypertension -Home meds include Coreg 18.75 mg twice daily, Lasix 80 mg a.m. and 40 mg p.m. with potassium supplement. -Echocardiogram with EF 60 to 65%, mild LVH, elevated right ventricular systolic pressure to 65. -Patient had  high-grade distal ICA stenosis and is at risk of cerebral hypoperfusion with strict blood pressure control. Blood pressure is currently in 140s.  Lasix remains on hold. -Continue Coreg.  Hypokalemia -Potassium level low.  Replacement ordered this morning.  Restless legs -Resume Requip  Mobility: PT/OT eval Code Status:   Code Status: Full Code per chart DVT prophylaxis:  Lovenox  subcu Antimicrobials:  None Fluid: None  Diet: N.p.o. for now Daily speech evaluation Diet Order            Diet regular Room service appropriate? Yes with Assist; Fluid consistency: Thin  Diet effective now               Consultants: Neurology Family Communication:  None at bedside  Status is: Inpatient Remains inpatient appropriate because:Ongoing diagnostic testing needed not appropriate for outpatient work up  Dispo: The patient is from: Home              Anticipated d/c is to: SNF              Anticipated d/c date is: 2 days              Patient currently is not medically stable to d/c.  Infusions:  . diltiazem (CARDIZEM) infusion Stopped (12/05/19 1507)    Scheduled Meds: . apixaban  5 mg Oral BID  . carvedilol  18.75 mg Oral BID  . ezetimibe  10 mg Oral Daily  . potassium chloride  40 mEq Oral Once  . rOPINIRole  1 mg Oral QHS    Antimicrobials: Anti-infectives (From admission, onward)   None      PRN meds: acetaminophen **OR** acetaminophen (TYLENOL) oral liquid 160 mg/5 mL **OR** acetaminophen   Objective: Vitals:   12/07/19 0848 12/07/19 1149  BP: 138/65 (!) 159/67  Pulse: 87 90  Resp: 17 20  Temp:  98.1 F (36.7 C)  SpO2: 96% 100%    Intake/Output Summary (Last 24 hours) at 12/07/2019 1322 Last data filed at 12/07/2019 0831 Gross per 24 hour  Intake 659.91 ml  Output 350 ml  Net 309.91 ml   Filed Weights   12/05/19 1027  Weight: 68.6 kg   Weight change:  Body mass index is 29.54 kg/m.   Physical Exam: General exam: No physical distress. Skin: No rashes, lesions or ulcers. HEENT: Atraumatic, normocephalic, supple neck, no obvious bleeding Lungs: Clear to auscultation bilaterally.  No crackles, no wheezing.   CVS: Rate controlled A. fib, no murmur GI/Abd soft, nontender, nondistended, bowel sound present CNS: New right lower extremity weakness.  Continues to have weakness of the right upper extremity Psychiatry: Anxious  today Extremities: No pedal edema, no calf tenderness  Data Review: I have personally reviewed the laboratory data and studies available.  Recent Labs  Lab 12/05/19 0230 12/05/19 0235 12/05/19 1057  WBC 7.7  --  6.5  NEUTROABS 5.4  --   --   HGB 15.0 14.6 14.9  HCT 46.2* 43.0 45.2  MCV 89.7  --  89.7  PLT 205  --  211   Recent Labs  Lab 12/05/19 0230 12/05/19 0235 12/05/19 0751  NA 142 145  --   K 3.4* 3.0*  --   CL 109 108  --   CO2 24  --   --   GLUCOSE 124* 124*  --   BUN 19 20  --   CREATININE 0.78 0.80 0.70  CALCIUM 9.4  --   --     Signed, Terrilee Croak, MD  [facility] Pager: [phone] (Secure Chat preferred). 12/07/2019

## 2019-12-07 NOTE — NC FL2 (Signed)
[place] LEVEL OF CARE SCREENING TOOL     IDENTIFICATION  Patient Name: Sara [name] Birthdate: August 05, 1925 Sex: female Admission Date (Current Location): 12/05/2019  [facility] and [place] Number:  Herbalist and Address:  The . [facility], [place] [address] [place], [place] [address]      Provider Number: [id number]  Attending Physician Name and Address:  Terrilee Croak, MD  Relative Name and Phone Number:  [facility]    Current Level of Care: Hospital Recommended Level of Care: [place] Prior Approval Number:    Date Approved/Denied:   PASRR Number: Pending  Discharge Plan: SNF    Current Diagnoses: Patient Active Problem List   Diagnosis Date Noted   Acute CVA (cerebrovascular accident) ([place]) 12/05/2019   Cerebral infarction [facility])    Syncope    PAD (peripheral artery disease) ([place])    Benign essential HTN    Hyperglycemia    Hx of breast cancer    Gastroesophageal reflux disease    Chronic diastolic congestive heart failure ([place])    Anxiety state    History of GI bleed    Arthritis of left knee 07/08/2018   Left hand pain 04/23/2018   Claudication in peripheral vascular disease ([place]) 03/18/2018   Peripheral arterial disease ([place]) 02/27/2018   Preop cardiovascular exam 01/19/2018   Unilateral primary osteoarthritis, left knee 01/08/2018   Status post total left knee replacement 11/16/2017   Hypokalemia    Chronic diastolic CHF (congestive heart failure), NYHA class 1 ([place]) 05/16/2016   Gait abnormality 04/11/2016   History of breast cancer 04/11/2016   Atrial fibrillation ([place]) 03/29/2016   Atypical syncope 03/26/2016   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   Obesity (BMI 30-39.9) 03/18/2013   Essential hypertension 03/18/2013    Orientation RESPIRATION BLADDER Height & Weight     Self, Time, Situation, Place  Normal Incontinent, External catheter Weight: 151 lb 3.8 oz  (68.6 kg) Height:  5' (152.4 cm)  BEHAVIORAL SYMPTOMS/MOOD NEUROLOGICAL BOWEL NUTRITION STATUS      Incontinent Diet (See discharge summary)  AMBULATORY STATUS COMMUNICATION OF NEEDS Skin   Extensive Assist Verbally Normal                       Personal Care Assistance Level of Assistance  Bathing, Feeding, Dressing Bathing Assistance: Maximum assistance Feeding assistance: Limited assistance Dressing Assistance: Maximum assistance     Functional Limitations Info  Sight, Speech, Hearing Sight Info: Adequate Hearing Info: Adequate Speech Info: Adequate    SPECIAL CARE FACTORS FREQUENCY  PT (By licensed PT), OT (By licensed OT)     PT Frequency: 5x a week OT Frequency: 5x a week            Contractures Contractures Info: Not present    Additional Factors Info  Code Status, Allergies Code Status Info: FULL Allergies Info: Amiodarone, [place], Amlodipine Besylate-valsartan, Bystolic (Nebivolol Hcl), Clonidine Derivatives, Dronedarone, Hctz (Hydrochlorothiazide), Hydralazine, Lisinopril, Nebivolol, Olmesartan, Oxycodone, Rythmol (Propafenone), Statins, Carvedilol, Prednisone           Current Medications (12/07/2019):  This is the current hospital active medication list Current Facility-Administered Medications  Medication Dose Route Frequency Provider Last Rate Last Admin   acetaminophen (TYLENOL) tablet 650 mg  650 mg Oral Q4H PRN Rise Patience, MD   650 mg at 12/07/19 1154   Or   acetaminophen (TYLENOL) 160 MG/5ML solution 650 mg  650 mg Per Tube Q4H PRN Rise Patience,  MD       Or   acetaminophen (TYLENOL) suppository 650 mg  650 mg Rectal Q4H PRN Rise Patience, MD       apixaban Arne Cleveland) tablet 5 mg  5 mg Oral BID Phillips Odor, MD       carvedilol (COREG) tablet 18.75 mg  18.75 mg Oral BID Dahal, Marlowe Aschoff, MD   18.75 mg at 12/07/19 0927   diltiazem (CARDIZEM) 125 mg in dextrose 5% 125 mL (1 mg/mL) infusion  5-15 mg/hr Intravenous  Continuous Rise Patience, MD   Stopped at 12/05/19 1507   ezetimibe (ZETIA) tablet 10 mg  10 mg Oral Daily Rosalin Hawking, MD   10 mg at 12/07/19 4481   potassium chloride SA (KLOR-CON) CR tablet 40 mEq  40 mEq Oral Once Terrilee Croak, MD       rOPINIRole (REQUIP) tablet 1 mg  1 mg Oral QHS Dahal, Marlowe Aschoff, MD   1 mg at 12/06/19 2131     Discharge Medications: Please see discharge summary for a list of discharge medications.  Relevant Imaging Results:  Relevant Lab Results:   Additional Information SSN [id number]  Neysa Hotter [place], [place]

## 2019-12-07 NOTE — Progress Notes (Signed)
Pt c/o itchy rash on mid back. Pt states she thinks it is due to her first dose of eliquis from 1700 today. Holding tonight's dose of eliquis per on-call provider. Applied lotion to pt's back, pt states lotion is helping with the itching.

## 2019-12-07 NOTE — Plan of Care (Signed)
  Problem: Clinical Measurements: Goal: Will remain free from infection Outcome: Completed/Met Goal: Respiratory complications will improve Outcome: Completed/Met   Problem: Nutrition: Goal: Adequate nutrition will be maintained Outcome: Completed/Met   Problem: Elimination: Goal: Will not experience complications related to urinary retention Outcome: Completed/Met   Problem: Skin Integrity: Goal: Risk for impaired skin integrity will decrease Outcome: Completed/Met   

## 2019-12-07 NOTE — TOC Initial Note (Addendum)
Transition of Care [facility]) - Initial/Assessment Note    Patient Details  Name: Sara [name] MRN: [id number] Date of Birth: 1925/08/10  [facility] of [facility]) CM/SW Contact:    Jacquelynn Cree Phone Number: 12/07/2019, 4:17 PM  Clinical Narrative:                  CSW met with patient bedside to discuss PT recommendation of SNF placement at discharge. Patient expressed understanding and is in agreement with recommendation. Patient expressed she has a preference for [facility]. She expressed she does not want any other  SNFs. CSW explained insurance process and the need for the SNF to take [facility]. Patient provided permission for CSW to fax referrals to [facility] for secondary options.   PTA, patient lived with her son. CSW asked if patient's son Sara [name] can be contacted, patient expressed he would be visiting later in the day and asked CSW to provide Medicare list for him to review when he visits. Patient expressed no other questions at this time.  Unable to obtain Passr due to info not matching [facility] records. Will need to contact customer service during business hours.  Expected Discharge Plan: Skilled Nursing Facility Barriers to Discharge: Continued Medical Work up, Ship broker   Patient Goals and CMS Choice   CMS Medicare.gov Compare Post Acute Care list provided to:: Patient Choice offered to / list presented to : Patient  Expected Discharge Plan and Services Expected Discharge Plan: [place]       Living arrangements for the past 2 months: Single Family Home                                      Prior Living Arrangements/Services Living arrangements for the past 2 months: Single Family Home Lives with:: Adult Children Patient language and need for interpreter reviewed:: Yes Do you feel safe going back to the place where you live?: Yes      Need for Family Participation in Patient Care: No (Comment) Care  giver support system in place?: Yes (comment)   Criminal Activity/Legal Involvement Pertinent to Current Situation/Hospitalization: No - Comment as needed  Activities of Daily Living Home Assistive Devices/Equipment: None ADL Screening (condition at time of admission) Patient's cognitive ability adequate to safely complete daily activities?: Yes Is the patient deaf or have difficulty hearing?: No Does the patient have difficulty seeing, even when wearing glasses/contacts?: No Does the patient have difficulty concentrating, remembering, or making decisions?: No Patient able to express need for assistance with ADLs?: Yes Does the patient have difficulty dressing or bathing?: Yes Independently performs ADLs?: No Communication: Independent Dressing (OT): Needs assistance Is this a change from baseline?: Change from baseline, expected to last <3days Grooming: Needs assistance Is this a change from baseline?: Change from baseline, expected to last <3 days Feeding: Independent Bathing: Needs assistance Is this a change from baseline?: Change from baseline, expected to last <3 days Toileting: Needs assistance Is this a change from baseline?: Change from baseline, expected to last <3 days In/Out Bed: Needs assistance Is this a change from baseline?: Change from baseline, expected to last <3 days Walks in Home: Needs assistance Is this a change from baseline?: Change from baseline, expected to last <3 days Does the patient have difficulty walking or climbing stairs?: Yes Weakness of Legs: Right Weakness of Arms/Hands: Right  Permission Sought/Granted Permission sought to share information  with : Customer service manager, Family Supports Permission granted to share information with : Yes, Verbal Permission Granted  Share Information with NAME: Sara [name]  Permission granted to share info w AGENCY: SNFs  Permission granted to share info w Relationship: Son  Permission granted to share  info w Contact Information: [phone]  Emotional Assessment   Attitude/Demeanor/Rapport: Unable to Assess Affect (typically observed): Unable to Assess Orientation: : Oriented to Self, Oriented to Place, Oriented to  Time, Oriented to Situation Alcohol / Substance Use: Not Applicable Psych Involvement: No (comment)  Admission diagnosis:  Acute CVA (cerebrovascular accident) ([place]) [I63.9] Atrial fibrillation, unspecified type ([place]) [I48.91] Cerebral infarction, unspecified mechanism [facility]) [I63.9] Patient Active Problem List   Diagnosis Date Noted  . Acute CVA (cerebrovascular accident) ([place]) 12/05/2019  . Cerebral infarction ([place])   . Syncope   . PAD (peripheral artery disease) ([place])   . Benign essential HTN   . Hyperglycemia   . Hx of breast cancer   . Gastroesophageal reflux disease   . Chronic diastolic congestive heart failure ([place])   . Anxiety state   . History of GI bleed   . Arthritis of left knee 07/08/2018  . Left hand pain 04/23/2018  . Claudication in peripheral vascular disease ([place]) 03/18/2018  . Peripheral arterial disease ([place]) 02/27/2018  . Preop cardiovascular exam 01/19/2018  . Unilateral primary osteoarthritis, left knee 01/08/2018  . Status post total left knee replacement 11/16/2017  . Hypokalemia   . Chronic diastolic CHF (congestive heart failure), NYHA class 1 ([place]) 05/16/2016  . Gait abnormality 04/11/2016  . History of breast cancer 04/11/2016  . Atrial fibrillation ([place]) 03/29/2016  . Atypical syncope 03/26/2016  . 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 Obesity (BMI 30-39.9) 03/18/2013  . Essential hypertension 03/18/2013   PCP:  Townsend Roger, MD Pharmacy:   [place], [place] [address] [place] [address] Phone: [phone] Fax: [phone]     Social Determinants of Health (SDOH) Interventions    Readmission Risk Interventions No flowsheet data found.

## 2019-12-07 NOTE — Progress Notes (Signed)
Physical Therapy Treatment Patient Details Name: Sara [name] MRN: [id number] DOB: 04-06-1926 Today's Date: 12/07/2019    History of Present Illness 86 yo admitted with right weakness outside the window for tPA with scattered Left MCA infarcts. Repeat CT scan per pt report of increased RLE weakness 12/07/19 showed evolving nonhemorrhagic infarct from the previous insult. PMHx: HTN, AFib, CHF, GIB, breast CA, HOH    PT Comments    Pt presented in bed, A&Ox4, pleasant. Max(A) to sit EOB with pt assisting with LLE, LUE. Required max(A) to scoot EOB. Squat pivoted to recliner chair total(A)+2 for safety. Pt reported she felt good sitting in chair, nursing aware pt in chair and notified of need for lift equipment. Changed d/c recommendations to SNF, per son request. Will continue to follow acutely.   BP 149/73 supine  Spo2 100% 3L o2 beginning of session Spo2 94% RA end of session, nursing notified   Follow Up Recommendations  CIR (son prefers SNF)     Equipment Recommendations  Other (comment) (tbd)    Recommendations for Other Services       Precautions / Restrictions Precautions Precautions: Fall Precaution Comments: right hemiparesis    Mobility  Bed Mobility Overal bed mobility: Needs Assistance Bed Mobility: Supine to Sit     Supine to sit: Max assist     General bed mobility comments: Pt required max(A) for supine to sit, pt was able to initiate movement with LUE and LE and required max(A) for RLE, RUE and trunk elevation. Pt required max(A) to scoot EOB  Transfers Overall transfer level: Needs assistance   Transfers: Squat Pivot Transfers     Squat pivot transfers: +2 safety/equipment;Total assist     General transfer comment: Squat pivot to drop arm chair total(A).  Ambulation/Gait             General Gait Details: unable   Stairs             Wheelchair Mobility    Modified Rankin (Stroke Patients Only) Modified Rankin (Stroke  Patients Only) Pre-Morbid Rankin Score: Slight disability Modified Rankin: Severe disability     Balance Overall balance assessment: Needs assistance Sitting-balance support: Feet supported;Single extremity supported Sitting balance-Leahy Scale: Poor Sitting balance - Comments: mod assist with pt with posterior right LOB in sitting                                    Cognition Arousal/Alertness: Awake/alert Behavior During Therapy: WFL for tasks assessed/performed Overall Cognitive Status: Impaired/Different from baseline                         Following Commands: Follows multi-step commands inconsistently     Problem Solving: Slow processing;Requires tactile cues General Comments: Pt reqquired verbal and tactile cues for mobility. Pt required extra time for mobility.      Exercises      General Comments General comments (skin integrity, edema, etc.): Pt reports does not use o2 at home, 90-95% RA, nursing alerted and o2 left off. Pt reported she felt good sitting in chair and out of bed.      Pertinent Vitals/Pain Pain Assessment: No/denies pain    Home Living                      Prior Function            PT  Goals (current goals can now be found in the care plan section) Acute Rehab PT Goals PT Goal Formulation: With patient Time For Goal Achievement: 12/19/19 Potential to Achieve Goals: Fair Progress towards PT goals: Progressing toward goals    Frequency    Min 3X/week      PT Plan Current plan remains appropriate    Co-evaluation              AM-PAC PT "6 Clicks" Mobility   Outcome Measure  Help needed turning from your back to your side while in a flat bed without using bedrails?: A Lot Help needed moving from lying on your back to sitting on the side of a flat bed without using bedrails?: A Lot Help needed moving to and from a bed to a chair (including a wheelchair)?: Total Help needed standing up from a  chair using your arms (e.g., wheelchair or bedside chair)?: Total Help needed to walk in hospital room?: Total Help needed climbing 3-5 steps with a railing? : Total 6 Click Score: 8    End of Session Equipment Utilized During Treatment: Gait belt Activity Tolerance: Patient tolerated treatment well Patient left: in chair;with chair alarm set;with call bell/phone within reach Nurse Communication: Mobility status;Need for lift equipment PT Visit Diagnosis: Other abnormalities of gait and mobility (R26.89);Difficulty in walking, not elsewhere classified (R26.2);Hemiplegia and hemiparesis Hemiplegia - Right/Left: Right Hemiplegia - dominant/non-dominant: Dominant Hemiplegia - caused by: Cerebral infarction     Time: 1411-1439 PT Time Calculation (min) (ACUTE ONLY): 28 min  Charges:  $Therapeutic Activity: 23-37 mins                     [employer] SPT 12/07/2019   Rolland [place] 12/07/2019, 3:37 PM

## 2019-12-07 NOTE — Plan of Care (Signed)
  Problem: Clinical Measurements: Goal: Will remain free from infection Outcome: Completed/Met Goal: Respiratory complications will improve Outcome: Completed/Met   Problem: Clinical Measurements: Goal: Respiratory complications will improve Outcome: Completed/Met   Problem: Nutrition: Goal: Adequate nutrition will be maintained Outcome: Completed/Met   Problem: Elimination: Goal: Will not experience complications related to urinary retention Outcome: Completed/Met   Problem: Skin Integrity: Goal: Risk for impaired skin integrity will decrease Outcome: Completed/Met   Problem: Coping: Goal: Will verbalize positive feelings about self Outcome: Completed/Met

## 2019-12-07 NOTE — Discharge Instructions (Signed)

## 2019-12-07 NOTE — Progress Notes (Signed)
STROKE TEAM PROGRESS NOTE   INTERVAL HISTORY We are reconsulted on this patient because of worsening right hemiparesis.  She is on aspirin and has atrial fibrillation.  It appears she has still been on aspirin.   OBJECTIVE Vitals:   12/07/19 0833 12/07/19 0848 12/07/19 1149 12/07/19 1418  BP: (!) 130/46 138/65 (!) 159/67 (!) 149/73  Pulse: 78 87 90 99  Resp: (!) 33 17 20 19   Temp: 98.5 F (36.9 C)  98.1 F (36.7 C) 97.9 F (36.6 C)  TempSrc: Oral  Oral Oral  SpO2: 96% 96% 100% 97%  Weight:      Height:        CBC:  Recent Labs  Lab 12/05/19 0230 12/05/19 0230 12/05/19 0235 12/05/19 1057  WBC 7.7  --   --  6.5  NEUTROABS 5.4  --   --   --   HGB 15.0   < > 14.6 14.9  HCT 46.2*   < > 43.0 45.2  MCV 89.7  --   --  89.7  PLT 205  --   --  211   < > = values in this interval not displayed.    Basic Metabolic Panel:  Recent Labs  Lab 12/05/19 0230 12/05/19 0230 12/05/19 0235 12/05/19 0751  NA 142  --  145  --   K 3.4*  --  3.0*  --   CL 109  --  108  --   CO2 24  --   --   --   GLUCOSE 124*  --  124*  --   BUN 19  --  20  --   CREATININE 0.78   < > 0.80 0.70  CALCIUM 9.4  --   --   --    < > = values in this interval not displayed.    Lipid Panel:     Component Value Date/Time   CHOL 182 12/05/2019 0751   TRIG 58 12/05/2019 0751   HDL 44 12/05/2019 0751   CHOLHDL 4.1 12/05/2019 0751   VLDL 12 12/05/2019 0751   LDLCALC 126 (H) 12/05/2019 0751   HgbA1c:  Lab Results  Component Value Date   HGBA1C 5.7 (H) 12/05/2019   Urine Drug Screen:     Component Value Date/Time   LABOPIA NONE DETECTED 12/06/2019 0410   COCAINSCRNUR NONE DETECTED 12/06/2019 0410   LABBENZ NONE DETECTED 12/06/2019 0410   AMPHETMU NONE DETECTED 12/06/2019 0410   THCU NONE DETECTED 12/06/2019 0410   LABBARB NONE DETECTED 12/06/2019 0410    Alcohol Level     Component Value Date/Time   ETH <10 12/05/2019 0230    IMAGING  MR ANGIO HEAD WO CONTRAST 12/05/2019 IMPRESSION:   1. Negative for large vessel occlusion, but positive for extensive intracranial atherosclerosis:  2. Suspected High-grade distal Left ICA stenosis in the supraclinoid segment. But no significant Left MCA or ACA stenosis or branch occlusion is identified.  3. Severely stenotic non dominant distal Right Vertebral Artery, with up to moderate stenosis at the junction of the dominant Left Vertebrobasilar junction.   MR BRAIN WO CONTRAST 12/05/2019 IMPRESSION:  1. Positive for scattered acute infarcts in the Left MCA territory, primarily cortical. No associated hemorrhage or mass effect. Subtle involvement of the left MCA/ACA watershed. See MRA today reported separately.  2. Otherwise stable since 2016 and largely negative for age noncontrast MRI appearance of the brain.   CT HEAD CODE STROKE WO CONTRAST 12/05/2019 IMPRESSION:  1. No acute intracranial infarct or other  abnormality.  2. ASPECTS is 10.  3. Mild age-related cerebral atrophy with chronic small vessel ischemic disease.   Transthoracic Echocardiogram  1. Left ventricular ejection fraction, by estimation, is 60 to 65%. The  left ventricle has normal function. The left ventricle has no regional  wall motion abnormalities. There is mild left ventricular hypertrophy.  Left ventricular diastolic function  could not be evaluated.  2. Right ventricular systolic function is mildly reduced. The right  ventricular size is normal. There is severely elevated pulmonary artery  systolic pressure. The estimated right ventricular systolic pressure is  54.2 mmHg.  3. Left atrial size was moderately dilated.  4. The mitral valve is normal in structure. Mild to moderate mitral valve  regurgitation. No evidence of mitral stenosis.  5. The tricuspid valve is myxomatous. Tricuspid valve regurgitation is  mild to moderate.  6. The aortic valve is normal in structure. Aortic valve regurgitation is  mild. No aortic stenosis is present.  7. The  inferior vena cava is dilated in size with <50% respiratory  variability, suggesting right atrial pressure of 15 mmHg.   Bilateral Carotid Dopplers  Right Carotid: Velocities in the right ICA are consistent with a 1-39%  stenosis.   Left Carotid: Velocities in the left ICA are consistent with a 1-39%  stenosis. Upper end of range.     PHYSICAL EXAM  Temp:  [97.7 F (36.5 C)-98.5 F (36.9 C)] 97.9 F (36.6 C) (06/20 1418) Pulse Rate:  [67-103] 99 (06/20 1418) Resp:  [16-33] 19 (06/20 1418) BP: (113-159)/(46-87) 149/73 (06/20 1418) SpO2:  [96 %-100 %] 97 % (06/20 1418)  General - Well nourished, well developed, in no apparent distress, mildly lethargic.  Ophthalmologic - fundi not visualized due to noncooperation.  Cardiovascular - irregularly irregular heart rate and rhythm.  Mental Status -  Level of arousal and orientation to time, place, and person were intact. Language including expression, naming, repetition, comprehension was assessed and found intact. Mild dysarthria  Cranial Nerves II - XII - II - Visual field intact OU. III, IV, VI - Extraocular movements intact. V - Facial sensation intact bilaterally. VII - right nasolabial fold flattening. VIII - Hearing & vestibular intact bilaterally. X - Palate elevates symmetrically. XI - Chin turning & shoulder shrug intact bilaterally. XII - Tongue protrusion intact.  Motor Strength - The patient's strength was normal in LUE and LLE extremities but RUE proximal 0/5 and bicep 2/5 and finger grip 2/5. RLE 0/5 but with pain, 0/5 withdraw.  Bulk was normal and fasciculations were absent.   Motor Tone - Muscle tone was assessed at the neck and appendages and was normal.  Reflexes - The patient's reflexes were symmetrical in all extremities and she had no pathological reflexes.  Sensory - Light touch, temperature/pinprick were assessed and were symmetrical.    Coordination - The patient had normal movements in the left  hand with no ataxia or dysmetria.  Tremor was absent.  Gait and Station - deferred.   ASSESSMENT/PLAN Ms. Farha Dano is a 86 y.o. female with history of chronic diastolic HF, A-fib (not anticoagulated, history of breast CA, HLD, HTN and PAD presenting with right sided weakness and slurred speech. She did not receive IV t-PA due to late presentation (>4.5 hours from time of onset).  Stroke: L MCA and ACA territory scattered infarcts - embolic - carotid stenosis vs atrial fibrillation not on AC.   CT Head - No acute intracranial infarct or other abnormality. ASPECTS is 10.  MRI head - scattered acute infarcts in the Left MCA territory, primarily cortical. No associated hemorrhage or mass effect. Subtle involvement of the left MCA/ACA watershed.    MRA head - Negative for large vessel occlusion, but positive for extensive intracranial atherosclerosis: High-grade distal Left ICA stenosis in the supraclinoid segment. Severely stenotic non dominant distal Right Vertebral Artery, with up to moderate stenosis at the junction of the dominant Left Vertebrobasilar junction.   Carotid Doppler unremarkable  2D Echo EF 60-65%  [employer] Virus 2 - negative  LDL - 126  HgbA1c pending  VTE prophylaxis - Lovenox  aspirin 81 mg daily and clopidogrel 75 mg daily prior to admission, now on ASA 325. Recommend eliquis 3 days post stroke for stroke prevention. ASA can be discontinued then.   Patient counseled to be compliant with her antithrombotic medications  Ongoing aggressive stroke risk factor management  Therapy recommendations: CIR  Disposition:  Pending  Chronic afib  Long standing afib  Was on coumadin long time ago, but off due to GIB  Not on [facility] currently  Follows with cardiology Dr. Ellyn Hack and Dr. Gwenlyn Found  Was on cardizem drip -> now off  She was a started on Eliquis and aspirin will be discontinued.  LGIB  Many years ago coumadin d/c due to LGIB  One year ago,  plavix was d/c due to LGIB  No actual GI work up in the past  Currently no LGIB for a while  Discussed with son, we will start eliquis trial in 3 days given stroke with afib.   Hb stable 14.6->14.9  Close monitoring  Hypertension  Home BP meds: Coreg  Current BP meds: Coreg, cardizem IV  Stable . Long-term BP goal 130-150 given severe left ICA siphon stenosis  Hyperlipidemia  Home Lipid lowering medication: none   LDL 126, goal < 70  Current lipid lowering medication: none due to statin intolerance  Put on zetia   Continue zetia at discharge  Other Stroke Risk Factors  Advanced age  Overweight, Body mass index is 29.54 kg/m., recommend weight loss, diet and exercise as appropriate   Congestive Heart Failure  Other Active Problems  Code status - Full code  Hypokalemia - 3.4->3.0 -> supplemented  Hospital day # 2   Dr Erlinda Hong signed off 12/05/19  Dr Cheral Marker was called to see the pt 12/05/19 for transient worsening of her right sided deficit and slurred speech and recommended increased fluids for known high grade distal left ICA stenosis.  Asked to see pt again today for increased RLE weakness ->   Repeat CT Head today 12/07/19   IMPRESSION:  1. Evolving nonhemorrhagic infarcts of the left middle frontal gyrus and posterior left insular ribbon.  2. No significant new infarct evident by CT.  3. Stable atrophy and white matter disease.  4. Remote infarct of the left thalamus.  5. Atherosclerosis.  Eliquis trial started 12/07/19, today, 5 mg Bid for atrial fibrillation as per plan outlined above.  Repeat CT scan is reviewed in person and shows no acute changes.    To contact Stroke Continuity provider, please refer to [email or web address] After hours, contact General Neurology

## 2019-12-08 LAB — BASIC METABOLIC PANEL
Anion gap: 5 (ref 5–15)
BUN: 13 mg/dL (ref 8–23)
CO2: 23 mmol/L (ref 22–32)
Calcium: 9.3 mg/dL (ref 8.9–10.3)
Chloride: 114 mmol/L — ABNORMAL HIGH (ref 98–111)
Creatinine, Ser: 0.66 mg/dL (ref 0.44–1.00)
GFR calc Af Amer: >60 mL/min (ref >60)
GFR calc non Af Amer: >60 mL/min (ref >60)
Glucose, Bld: 109 mg/dL — ABNORMAL HIGH (ref 70–99)
Potassium: 4.8 mmol/L (ref 3.5–5.1)
Sodium: 142 mmol/L (ref 135–145)

## 2019-12-08 LAB — MAGNESIUM: Magnesium: 2.2 mg/dL (ref 1.7–2.4)

## 2019-12-08 MED ORDER — ROPINIROLE HCL 1 MG PO TABS
1.5000 mg | ORAL_TABLET | Freq: Every day | ORAL | Status: DC
  Administered 2019-12-08 – 2019-12-11 (×4): 1.5 mg via ORAL
  Filled 2019-12-08 (×4): qty 2

## 2019-12-08 MED ORDER — POTASSIUM CHLORIDE IN NACL 20-0.9 MEQ/L-% IV SOLN
INTRAVENOUS | Status: AC
  Filled 2019-12-08: qty 1000

## 2019-12-08 MED ORDER — METOPROLOL TARTRATE 5 MG/5ML IV SOLN
2.5000 mg | Freq: Four times a day (QID) | INTRAVENOUS | Status: DC
  Administered 2019-12-08 – 2019-12-11 (×10): 2.5 mg via INTRAVENOUS
  Filled 2019-12-08 (×10): qty 5

## 2019-12-08 MED ORDER — POTASSIUM CHLORIDE 20 MEQ PO PACK
20.0000 meq | PACK | Freq: Two times a day (BID) | ORAL | Status: DC
  Administered 2019-12-08 – 2019-12-12 (×10): 20 meq via ORAL
  Filled 2019-12-08 (×10): qty 1

## 2019-12-08 NOTE — Progress Notes (Signed)
STROKE TEAM PROGRESS NOTE   INTERVAL HISTORY Patient is sitting up in bed.  She states she is doing better though she still has weakness.  Vital signs are stable.  CT scan of the head done yesterday shows no acute abnormality but evolving nonhemorrhagic infarcts in the left middle frontal gyrus and posterior left insular ribbon..   OBJECTIVE Vitals:   12/08/19 0020 12/08/19 0338 12/08/19 0900 12/08/19 1100  BP: (!) 107/54 131/62 (!) 145/77 134/80  Pulse: 72 78 99 71  Resp: 19 (!) 21 20 20   Temp:  97.7 F (36.5 C) 97.9 F (36.6 C) 97.7 F (36.5 C)  TempSrc:  Oral Oral Oral  SpO2: 98% 99% 99% 96%  Weight:      Height:        CBC:  Recent Labs  Lab 12/05/19 0230 12/05/19 0230 12/05/19 0235 12/05/19 1057  WBC 7.7  --   --  6.5  NEUTROABS 5.4  --   --   --   HGB 15.0   < > 14.6 14.9  HCT 46.2*   < > 43.0 45.2  MCV 89.7  --   --  89.7  PLT 205  --   --  211   < > = values in this interval not displayed.    Basic Metabolic Panel:  Recent Labs  Lab 12/05/19 0230 12/05/19 0230 12/05/19 0235 12/05/19 0751  NA 142  --  145  --   K 3.4*  --  3.0*  --   CL 109  --  108  --   CO2 24  --   --   --   GLUCOSE 124*  --  124*  --   BUN 19  --  20  --   CREATININE 0.78   < > 0.80 0.70  CALCIUM 9.4  --   --   --    < > = values in this interval not displayed.    Lipid Panel:     Component Value Date/Time   CHOL 182 12/05/2019 0751   TRIG 58 12/05/2019 0751   HDL 44 12/05/2019 0751   CHOLHDL 4.1 12/05/2019 0751   VLDL 12 12/05/2019 0751   LDLCALC 126 (H) 12/05/2019 0751   HgbA1c:  Lab Results  Component Value Date   HGBA1C 5.7 (H) 12/05/2019   Urine Drug Screen:     Component Value Date/Time   LABOPIA NONE DETECTED 12/06/2019 0410   COCAINSCRNUR NONE DETECTED 12/06/2019 0410   LABBENZ NONE DETECTED 12/06/2019 0410   AMPHETMU NONE DETECTED 12/06/2019 0410   THCU NONE DETECTED 12/06/2019 0410   LABBARB NONE DETECTED 12/06/2019 0410    Alcohol Level      Component Value Date/Time   ETH <10 12/05/2019 0230    IMAGING  MR ANGIO HEAD WO CONTRAST 12/05/2019 IMPRESSION:  1. Negative for large vessel occlusion, but positive for extensive intracranial atherosclerosis:  2. Suspected High-grade distal Left ICA stenosis in the supraclinoid segment. But no significant Left MCA or ACA stenosis or branch occlusion is identified.  3. Severely stenotic non dominant distal Right Vertebral Artery, with up to moderate stenosis at the junction of the dominant Left Vertebrobasilar junction.   MR BRAIN WO CONTRAST 12/05/2019 IMPRESSION:  1. Positive for scattered acute infarcts in the Left MCA territory, primarily cortical. No associated hemorrhage or mass effect. Subtle involvement of the left MCA/ACA watershed. See MRA today reported separately.  2. Otherwise stable since 2016 and largely negative for age noncontrast MRI appearance of  the brain.   CT HEAD CODE STROKE WO CONTRAST 12/05/2019 IMPRESSION:  1. No acute intracranial infarct or other abnormality.  2. ASPECTS is 10.  3. Mild age-related cerebral atrophy with chronic small vessel ischemic disease.   Transthoracic Echocardiogram  1. Left ventricular ejection fraction, by estimation, is 60 to 65%. The  left ventricle has normal function. The left ventricle has no regional  wall motion abnormalities. There is mild left ventricular hypertrophy.  Left ventricular diastolic function  could not be evaluated.  2. Right ventricular systolic function is mildly reduced. The right  ventricular size is normal. There is severely elevated pulmonary artery  systolic pressure. The estimated right ventricular systolic pressure is  27.2 mmHg.  3. Left atrial size was moderately dilated.  4. The mitral valve is normal in structure. Mild to moderate mitral valve  regurgitation. No evidence of mitral stenosis.  5. The tricuspid valve is myxomatous. Tricuspid valve regurgitation is  mild to moderate.  6.  The aortic valve is normal in structure. Aortic valve regurgitation is  mild. No aortic stenosis is present.  7. The inferior vena cava is dilated in size with <50% respiratory  variability, suggesting right atrial pressure of 15 mmHg.   Bilateral Carotid Dopplers  Right Carotid: Velocities in the right ICA are consistent with a 1-39%  stenosis.   Left Carotid: Velocities in the left ICA are consistent with a 1-39%  stenosis. Upper end of range.     PHYSICAL EXAM  Temp:  [97.5 F (36.4 C)-97.9 F (36.6 C)] 97.7 F (36.5 C) (06/21 1100) Pulse Rate:  [71-99] 71 (06/21 1100) Resp:  [18-21] 20 (06/21 1100) BP: (96-145)/(54-90) 134/80 (06/21 1100) SpO2:  [92 %-99 %] 96 % (06/21 1100)  General -mildly obese elderly Caucasian lady, in no apparent distress,   Ophthalmologic - fundi not visualized due to noncooperation.  Cardiovascular - irregularly irregular heart rate and rhythm.  Mental Status -  Level of arousal and orientation to time, place, and person were intact. Language including expression, naming, repetition, comprehension was assessed and found intact. Mild dysarthria  Cranial Nerves II - XII - II - Visual field intact OU. III, IV, VI - Extraocular movements intact. V - Facial sensation intact bilaterally. VII - right nasolabial fold flattening. VIII - Hearing & vestibular intact bilaterally. X - Palate elevates symmetrically. XI - Chin turning & shoulder shrug intact bilaterally. XII - Tongue protrusion intact.  Motor Strength - The patient's strength was normal in LUE and LLE extremities but RUE proximal 0/5 and bicep 2/5 and finger grip 2/5. RLE 0/5 but with pain, 0/5 withdraw.  Bulk was normal and fasciculations were absent.   Motor Tone - Muscle tone was assessed at the neck and appendages and was normal.  Reflexes - The patient's reflexes were symmetrical in all extremities and she had no pathological reflexes.  Sensory - Light touch, temperature/pinprick  were assessed and were symmetrical.    Coordination - The patient had normal movements in the left hand with no ataxia or dysmetria.  Tremor was absent.  Gait and Station - deferred.   ASSESSMENT/PLAN [name] is a 86 y.o. female with history of chronic diastolic HF, A-fib (not anticoagulated, history of breast CA, HLD, HTN and PAD presenting with right sided weakness and slurred speech. She did not receive IV t-PA due to late presentation (>4.5 hours from time of onset).  Stroke: L MCA and ACA territory scattered infarcts - embolic - carotid stenosis vs atrial  fibrillation not on AC.   CT Head - No acute intracranial infarct or other abnormality. ASPECTS is 10.   MRI head - scattered acute infarcts in the Left MCA territory, primarily cortical. No associated hemorrhage or mass effect. Subtle involvement of the left MCA/ACA watershed.    MRA head - Negative for large vessel occlusion, but positive for extensive intracranial atherosclerosis: High-grade distal Left ICA stenosis in the supraclinoid segment. Severely stenotic non dominant distal Right Vertebral Artery, with up to moderate stenosis at the junction of the dominant Left Vertebrobasilar junction.   Carotid Doppler unremarkable  2D Echo EF 60-65%  [employer] Virus 2 - negative  LDL - 126  HgbA1c pending  VTE prophylaxis - Lovenox  aspirin 81 mg daily and clopidogrel 75 mg daily prior to admission, now on ASA 325. Recommend eliquis 3 days post stroke for stroke prevention. ASA can be discontinued then.   Patient counseled to be compliant with her antithrombotic medications  Ongoing aggressive stroke risk factor management  Therapy recommendations: CIR  Disposition:  Pending  Chronic afib  Long standing afib  Was on coumadin long time ago, but off due to GIB  Not on [facility] currently  Follows with cardiology Dr. Ellyn Hack and Dr. Gwenlyn Found  Was on cardizem drip -> now off  She was a started on Eliquis and  aspirin will be discontinued.  LGIB  Many years ago coumadin d/c due to LGIB  One year ago, plavix was d/c due to LGIB  No actual GI work up in the past  Currently no LGIB for a while  Discussed with son, we will start eliquis trial in 3 days given stroke with afib.   Hb stable 14.6->14.9  Close monitoring  Hypertension  Home BP meds: Coreg  Current BP meds: Coreg, cardizem IV  Stable . Long-term BP goal 130-150 given severe left ICA siphon stenosis  Hyperlipidemia  Home Lipid lowering medication: none   LDL 126, goal < 70  Current lipid lowering medication: none due to statin intolerance  Put on zetia   Continue zetia at discharge  Other Stroke Risk Factors  Advanced age  Overweight, Body mass index is 29.54 kg/m., recommend weight loss, diet and exercise as appropriate   Congestive Heart Failure  Other Active Problems  Code status - Full code  Hypokalemia - 3.4->3.0 -> supplemented  Hospital day # 3   Dr Erlinda Hong signed off 12/05/19  Dr Cheral Marker was called to see the pt 12/05/19 for transient worsening of her right sided deficit and slurred speech and recommended increased fluids for known high grade distal left ICA stenosis.  Asked to see pt again today for increased RLE weakness ->   Repeat CT Head today 12/07/19   IMPRESSION:  1. Evolving nonhemorrhagic infarcts of the left middle frontal gyrus and posterior left insular ribbon.  2. No significant new infarct evident by CT.  3. Stable atrophy and white matter disease.  4. Remote infarct of the left thalamus.  5. Atherosclerosis. Eliquis trial started 12/07/19, and continue, 5 mg Bid for atrial fibrillation as per plan outlined above.  Continue present management.  Transfer to rehab when bed available.  Stroke team will sign off.  Kindly call for questions.  Discussed with Dr. Luna Kitchens, MD  To contact Stroke Continuity provider, please refer to [email or web address] After hours, contact General  Neurology

## 2019-12-08 NOTE — Care Management Important Message (Signed)
Important Message  Patient Details  Name: Shakina Choy MRN: [id number] Date of Birth: 08/01/1925   Medicare Important Message Given:  Yes  Patient unable to sign.  Signed on patient behalf and copy left at the bedside.   Esthefany Herrig 12/08/2019, 3:24 PM

## 2019-12-08 NOTE — Progress Notes (Signed)
PROGRESS NOTE  Sara [name]  DOB: May 14, 1926  PCP: Townsend Roger, MD TMH:[id number]  DOA: 12/05/2019  LOS: 3 days   Chief Complaint  Patient presents with  . Code Stroke   Brief narrative: Sara [name] is a 86 y.o. female with PMH of HTN, HLD, PAD, chronic diastolic CHF, A. fib, GI bleed, history of breast cancer. Patient presented to the ED on 12/05/2019 from home via EMS as a Code Stroke for acute onset of right sided weakness and slurred speech. Son dropped her off at home at 1 PM on 6/17 following a visit to her hairdresser. She was normal until 5 PM, when she abruptly became weak on the right and dropped the food she was cooking. When her son arrived home at 6 PM, he noticed slurred speech. After symptoms did not resolve in the next several hours, he called EMS at about 1:45 AM. On arrival by EMS, they noticed mild slurring of speech, but no focal weakness.  Vitals en route: CBG 133, BP 160s/100, O2 sat 95% on RA, A-fib on rhythm strip. She takes ASA at home. She was taken off Plavix 3 weeks ago for a GIB. Not on an anticoagulant. She has no prior history of stroke.   In the ER on exam patient has 3/5 strength in the right lower extremity and has decreased grip strength on the right upper extremity.  Patient failed swallow evaluation.   Seen by neurologist in ED. CT head was unremarkable. MRI brain was positive for scattered acute infarcts in the Left MCA territory, primarily cortical. No associated hemorrhage or mass effect. Subtle involvement of the left MCA/ACA watershed. MRA head and neck negative for large vessel occlusion, but positive for extensive intracranial atherosclerosis. Suspected High-grade distal Left ICA stenosis in the supraclinoid Segment. But no significant Left MCA or ACA stenosis or branch occlusion is identified. Severely stenotic non dominant distal Right Vertebral Artery, with up to moderate stenosis at the junction of the dominant Left Vertebrobasilar  junction. EKG showed A. fib with RVR for which patient was started on Cardizem infusion.   Patient was admitted under hospitalist service for stroke work-up   Subjective: Patient was seen and examined this morning.  Lying in bed.  Not in distress.   Patient continues to have weakness in extremity today repeat CT scan of chest today did not show any acute infarct.    Assessment/Plan: Acute stroke -Presented with acute onset right-sided weakness and dysarthria.  Outside of TPA window -MRI brain positive for multiple scattered acute infarcts in the left MCA territory -6/20, patient complains of new right lower extremity weakness since the other day.  Repeat CT scan of head was obtained.  Did not show any new infarct.  It showed evolving nonhemorrhagic infarct from the previous insult. -Patient continues to have weakness. -Neurology consult appreciated.  Patient was started on Eliquis twice daily. -Continue Zetia. -Echo 60 to 65% EF with mild LVH.   -Carotid duplex with nonsignificant stenosis bilaterally. -PT/OT/ST eval obtained.  SNF recommended. -Lipid panel with LDL 126, A1c 5.7.  TSH normal.    A. fib with RVR -Was started on Cardizem drip on admission, ultimately stopped. -Coreg has been resumed. -Not on anticoagulation because of risk of fall and recent GI bleeding.  History of diastolic CHF Essential hypertension -Home meds include Coreg 18.75 mg twice daily, Lasix 80 mg a.m. and 40 mg p.m. with potassium supplement. -Echocardiogram with EF 60 to 65%, mild LVH, elevated right ventricular systolic  pressure to 65. -Patient had high-grade distal ICA stenosis and is at risk of cerebral hypoperfusion with strict blood pressure control. Blood pressure is currently in 140s.  Lasix remains on hold. -Continue Coreg.  Hypokalemia -Potassium level pending this morning.  Restless legs -Resume Requip  Mobility: PT/OT eval Code Status:   Code Status: Full Code per chart DVT  prophylaxis:  Lovenox subcu Antimicrobials:  None Fluid: None  Diet: N.p.o. for now Daily speech evaluation Diet Order            Diet regular Room service appropriate? Yes with Assist; Fluid consistency: Thin  Diet effective now               Consultants: Neurology Family Communication:  None at bedside  Status is: Inpatient Remains inpatient appropriate because:Ongoing diagnostic testing needed not appropriate for outpatient work up  Dispo: The patient is from: Home              Anticipated d/c is to: SNF              Anticipated d/c date is: 2 days              Patient currently is not medically stable to d/c.  Infusions:  . 0.9 % NaCl with KCl 20 mEq / L 75 mL/hr at 12/08/19 0346  . diltiazem (CARDIZEM) infusion Stopped (12/05/19 1507)    Scheduled Meds: . apixaban  5 mg Oral BID  . carvedilol  18.75 mg Oral BID  . ezetimibe  10 mg Oral Daily  . potassium chloride  20 mEq Oral BID  . potassium chloride  40 mEq Oral Once  . rOPINIRole  1.5 mg Oral QHS    Antimicrobials: Anti-infectives (From admission, onward)   None      PRN meds: acetaminophen **OR** acetaminophen (TYLENOL) oral liquid 160 mg/5 mL **OR** acetaminophen, hydrOXYzine, melatonin   Objective: Vitals:   12/08/19 0900 12/08/19 1100  BP: (!) 145/77 134/80  Pulse: 99 71  Resp: 20 20  Temp: 97.9 F (36.6 C) 97.7 F (36.5 C)  SpO2: 99% 96%    Intake/Output Summary (Last 24 hours) at 12/08/2019 1556 Last data filed at 12/08/2019 1400 Gross per 24 hour  Intake 1136.66 ml  Output 250 ml  Net 886.66 ml   Filed Weights   12/05/19 1027  Weight: 68.6 kg   Weight change:  Body mass index is 29.54 kg/m.   Physical Exam: General exam: No physical distress. Skin: No rashes, lesions or ulcers. HEENT: Atraumatic, normocephalic, supple neck, no obvious bleeding Lungs: Clear to auscultation bilaterally.  No crackles, no wheezing.   CVS: Rate controlled A. fib, no murmur GI/Abd soft, nontender,  nondistended, bowel sound present CNS: New right lower extremity weakness.  Continues to have weakness of the right upper extremity Psychiatry: Anxious today Extremities: No pedal edema, no calf tenderness  Data Review: I have personally reviewed the laboratory data and studies available.  Recent Labs  Lab 12/05/19 0230 12/05/19 0235 12/05/19 1057  WBC 7.7  --  6.5  NEUTROABS 5.4  --   --   HGB 15.0 14.6 14.9  HCT 46.2* 43.0 45.2  MCV 89.7  --  89.7  PLT 205  --  211   Recent Labs  Lab 12/05/19 0230 12/05/19 0235 12/05/19 0751  NA 142 145  --   K 3.4* 3.0*  --   CL 109 108  --   CO2 24  --   --   GLUCOSE 124*  124*  --   BUN 19 20  --   CREATININE 0.78 0.80 0.70  CALCIUM 9.4  --   --     Signed, Terrilee Croak, MD [facility] Pager: [phone] (Secure Chat preferred). 12/08/2019

## 2019-12-08 NOTE — Progress Notes (Signed)
   12/08/19 1640  Assess: MEWS Score  Temp 97.6 F (36.4 C)  BP (!) 182/111  Pulse Rate (!) 153  ECG Heart Rate (!) 120  Resp (!) 25  Level of Consciousness Alert  SpO2 96 %  Assess: MEWS Score  MEWS Temp 0  MEWS Systolic 0  MEWS Pulse 2  MEWS RR 1  MEWS LOC 0  MEWS Score 3  MEWS Score Color Yellow  Assess: if the MEWS score is Yellow or Red  Were vital signs taken at a resting state? No  Focused Assessment Documented focused assessment  Early Detection of Sepsis Score *See Row Information* Low  MEWS guidelines implemented *See Row Information* No, previously yellow, continue vital signs every 4 hours  Treat  MEWS Interventions Administered prn meds/treatments  Take Vital Signs  Increase Vital Sign Frequency  Yellow: Q 2hr X 2 then Q 4hr X 2, if remains yellow, continue Q 4hrs  Escalate  MEWS: Escalate Yellow: discuss with charge nurse/RN and consider discussing with provider and RRT  Notify: Charge Nurse/RN  Name of Charge Nurse/RN Notified Merleen Nicely  Date Charge Nurse/RN Notified 12/08/19  Time Charge Nurse/RN Notified 1642  Notify: Provider  Provider Name/Title Dr Pietro Cassis  Date Provider Notified 12/08/19  Time Provider Notified 1640  Notification Type Page  Notification Reason Other (Comment)  Response Other (Comment)

## 2019-12-08 NOTE — Plan of Care (Signed)
  Problem: Activity: Goal: Risk for activity intolerance will decrease Outcome: Progressing   Problem: Coping: Goal: Level of anxiety will decrease Outcome: Progressing   Problem: Pain Managment: Goal: General experience of comfort will improve Outcome: Progressing   

## 2019-12-08 NOTE — TOC Progression Note (Addendum)
Transition of Care [facility]) - Progression Note    Patient Details  Name: Sara [name] MRN: [id number] Date of Birth: 1926-04-12  Transition of [facility] CM/SW [place], [place] Phone Number: 12/08/2019, 4:33 PM  Clinical Narrative:   Referral for [facility] still pending. CSW asked them to review. CSW contacted [employer], and patient is not managed through them; insurance authorization will need to be sent through [facility] and initiated by SNF when chosen. CSW also contacted [employer] to look up patient's PASRR number. CSW to follow.    Expected Discharge Plan: [place] Barriers to Discharge: Continued Medical Work up, Ship broker  Expected Discharge Plan and Services Expected Discharge Plan: [place] arrangements for the past 2 months: Single Family Home                                       Social Determinants of Health (SDOH) Interventions    Readmission Risk Interventions No flowsheet data found.

## 2019-12-09 ENCOUNTER — Ambulatory Visit (HOSPITAL_COMMUNITY): Admission: RE | Admit: 2019-12-09 | Payer: Medicare HMO | Source: Ambulatory Visit

## 2019-12-09 ENCOUNTER — Ambulatory Visit: Payer: Medicare HMO | Admitting: Cardiovascular Disease

## 2019-12-09 LAB — SARS CORONAVIRUS 2 (TAT 6-24 HRS): SARS Coronavirus 2: NEGATIVE

## 2019-12-09 MED ORDER — CARVEDILOL 6.25 MG PO TABS
6.2500 mg | ORAL_TABLET | Freq: Once | ORAL | Status: AC
  Administered 2019-12-09: 6.25 mg via ORAL
  Filled 2019-12-09: qty 1

## 2019-12-09 MED ORDER — CARVEDILOL 6.25 MG PO TABS
18.7500 mg | ORAL_TABLET | Freq: Every day | ORAL | Status: DC
  Administered 2019-12-09: 18.75 mg via ORAL
  Filled 2019-12-09: qty 1

## 2019-12-09 MED ORDER — CARVEDILOL 12.5 MG PO TABS
25.0000 mg | ORAL_TABLET | Freq: Every day | ORAL | Status: DC
  Administered 2019-12-10 – 2019-12-11 (×2): 25 mg via ORAL
  Filled 2019-12-09 (×2): qty 2

## 2019-12-09 NOTE — Progress Notes (Signed)
Physical Therapy Treatment Patient Details Name: Sara [name] MRN: [id number] DOB: 10-13-1925 Today's Date: 12/09/2019    History of Present Illness 86 yo admitted with right weakness outside the window for tPA with scattered Left MCA infarcts. Repeat CT scan per pt report of increased RLE weakness 12/07/19 showed evolving nonhemorrhagic infarct from the previous insult. PMHx: HTN, AFib, CHF, GIB, breast CA, HOH    PT Comments    Pt restless verging on agitated upon PT and OT arrival to room, pt disoriented and visibly upset about family not calling her. Pt agreeable to mobility, especially once sitting up at EOB and states "that feeding chair I was in was uncomfortable (referring to bed)". Pt required max-total +2 for bed mobility and transfer to recliner via drop arm recliner and lateral scoot pivot with assist of bed pads. Pt limited by LE weakness, difficulty with command following as pt with tendency to lean R thus impeding hip translation toward R, and pushing with LUE. Noticeable flexor synergy RLE noted during PROM/AAROM activity. PT updated recommendation to reflect SNF, plans to d/c to [facility]. Will continue to follow acutely.    Follow Up Recommendations  SNF     Equipment Recommendations  Other (comment) (tbd)    Recommendations for Other Services       Precautions / Restrictions Precautions Precautions: Fall Precaution Comments: right hemiparesis, RLE flexor synergy with mobility Restrictions Weight Bearing Restrictions: No    Mobility  Bed Mobility Overal bed mobility: Needs Assistance Bed Mobility: Supine to Sit     Supine to sit: Max assist;+2 for physical assistance;+2 for safety/equipment;HOB elevated     General bed mobility comments: Max assist +2 for trunk and LE management, used helicopter technique with bed pads as pt not following cues to participate in supine>sit. Increased time, effort, and bed pad assist for scooting to EOB.  Transfers Overall  transfer level: Needs assistance   Transfers: Lateral/Scoot Transfers          Lateral/Scoot Transfers: Total assist;+2 physical assistance;+2 safety/equipment;From elevated surface General transfer comment: total +2 for series of lateral scoots towards R (towards weak side due to drop arm recliner being drop arm on L only), performed with PT assisting anteriorly for trunk translation and head-hips relationship and OT assisting posteriorly for guiding hips into recliner. Increased time, total +2 once in recliner for positioning with bed pads.  Ambulation/Gait             General Gait Details: unable   Stairs             Wheelchair Mobility    Modified Rankin (Stroke Patients Only) Modified Rankin (Stroke Patients Only) Pre-Morbid Rankin Score: Slight disability Modified Rankin: Severe disability     Balance Overall balance assessment: Needs assistance Sitting-balance support: Feet supported;Single extremity supported Sitting balance-Leahy Scale: Poor Sitting balance - Comments: brief periods of min guard assist only to maintain sitting, lasting seconds only. Overall poor sitting balance especially with dynamic tasks     Standing balance-Leahy Scale: Zero                              Cognition Arousal/Alertness: Awake/alert Behavior During Therapy: Restless Overall Cognitive Status: Impaired/Different from baseline Area of Impairment: Orientation;Memory;Attention;Following commands;Safety/judgement                 Orientation Level: Disoriented to;Place;Time;Situation Current Attention Level: Sustained Memory: Decreased short-term memory Following Commands: Follows one step commands inconsistently Safety/Judgement:  Decreased awareness of safety;Decreased awareness of deficits   Problem Solving: Slow processing;Requires tactile cues;Decreased initiation;Difficulty sequencing;Requires verbal cues General Comments: Pt fidgety and irritable  upon arrival to room, states "I can't get out of this feeding chair". Pt states she is in the nursing home, PT oriented pt to hospital and pt states "I know". 2 minutes later states she is in the nursing home and people have been coming into her room and scaring her at night. Pt requires multiomdal cuing for safety with mobility, and frequent cues.      Exercises General Exercises - Lower Extremity Long Arc Quad: PROM;Right;10 reps;Seated (hypertonicity noted, decreased with repeated knee extension)    General Comments        Pertinent Vitals/Pain Pain Assessment: Faces Faces Pain Scale: Hurts little more Pain Location: R leg, "from my hip fracture" Pain Descriptors / Indicators: Sore;Aching Pain Intervention(s): Monitored during session;Repositioned;Limited activity within patient's tolerance    Home Living                      Prior Function            PT Goals (current goals can now be found in the care plan section) Acute Rehab PT Goals PT Goal Formulation: With patient Time For Goal Achievement: 12/19/19 Potential to Achieve Goals: Fair Progress towards PT goals: Progressing toward goals    Frequency    Min 3X/week      PT Plan Discharge plan needs to be updated    Co-evaluation PT/OT/SLP Co-Evaluation/Treatment: Yes Reason for Co-Treatment: For patient/therapist safety;Necessary to address cognition/behavior during functional activity;To address functional/ADL transfers PT goals addressed during session: Mobility/safety with mobility;Balance;Strengthening/ROM        AM-PAC PT "6 Clicks" Mobility   Outcome Measure  Help needed turning from your back to your side while in a flat bed without using bedrails?: A Lot Help needed moving from lying on your back to sitting on the side of a flat bed without using bedrails?: Total Help needed moving to and from a bed to a chair (including a wheelchair)?: Total Help needed standing up from a chair using your  arms (e.g., wheelchair or bedside chair)?: Total Help needed to walk in hospital room?: Total Help needed climbing 3-5 steps with a railing? : Total 6 Click Score: 7    End of Session Equipment Utilized During Treatment: Gait belt Activity Tolerance: Patient tolerated treatment well;Patient limited by fatigue Patient left: in chair;with chair alarm set;with call bell/phone within reach;with SCD's reapplied Nurse Communication: Mobility status;Other (comment) (scoot +2 with drop arm recliner towards left for back to bed, vs maximove) PT Visit Diagnosis: Other abnormalities of gait and mobility (R26.89);Difficulty in walking, not elsewhere classified (R26.2);Hemiplegia and hemiparesis Hemiplegia - Right/Left: Right Hemiplegia - dominant/non-dominant: Dominant Hemiplegia - caused by: Cerebral infarction     Time: 1447-1520 PT Time Calculation (min) (ACUTE ONLY): 33 min  Charges:  $Therapeutic Activity: 8-22 mins                    Rowland Ericsson E, PT Acute Rehabilitation Services Pager [phone]  Office [phone]   Damiah Mcdonald D Elonda Husky 12/09/2019, 4:18 PM

## 2019-12-09 NOTE — Progress Notes (Signed)
Occupational Therapy Treatment Patient Details Name: Sara [name] MRN: [id number] DOB: 08/24/1925 Today's Date: 12/09/2019    History of present illness 86 yo admitted with right weakness outside the window for tPA with scattered Left MCA infarcts. Repeat CT scan per pt report of increased RLE weakness 12/07/19 showed evolving nonhemorrhagic infarct from the previous insult. PMHx: HTN, AFib, CHF, GIB, breast CA, HOH   OT comments  Pt seen in conjunction with PT to maximize participation and activity tolerance. Upon arrival, pt very restless in bed thinking she was in her "high chair" at the nursing home. Pt very disgruntled and agreeable to OOB. Overall, pt required total A +2 for lateral scoot transfer to recliner to pts R side. Pt with increased RLE flexor tone this session. Pt with poor sitting balance overall needing at least Min guard for sitting balance EOB. Pt completed seated grooming tasks from recliner with supervision- set up assist. Will follow.   Follow Up Recommendations  CIR    Equipment Recommendations  Other (comment) (defer to next venue of care)    Recommendations for Other Services      Precautions / Restrictions Precautions Precautions: Fall Precaution Comments: right hemiparesis, RLE flexor synergy with mobility Restrictions Weight Bearing Restrictions: No       Mobility Bed Mobility Overal bed mobility: Needs Assistance Bed Mobility: Supine to Sit     Supine to sit: Max assist;+2 for physical assistance;+2 for safety/equipment;HOB elevated     General bed mobility comments: Max assist +2 for trunk and LE management, used helicopter technique with bed pads as pt not following cues to participate in supine>sit. Increased time, effort, and bed pad assist for scooting to EOB.  Transfers Overall transfer level: Needs assistance   Transfers: Lateral/Scoot Transfers          Lateral/Scoot Transfers: Total assist;+2 physical assistance;+2  safety/equipment;From elevated surface General transfer comment: total +2 for series of lateral scoots towards R (towards weak side due to drop arm recliner being drop arm on L only), performed with PT assisting anteriorly for trunk translation and head-hips relationship and OT assisting posteriorly for guiding hips into recliner. Increased time, total +2 once in recliner for positioning with bed pads.    Balance Overall balance assessment: Needs assistance Sitting-balance support: Feet supported;Single extremity supported Sitting balance-Leahy Scale: Poor Sitting balance - Comments: brief periods of min guard assist only to maintain sitting, lasting seconds only. Overall poor sitting balance especially with dynamic tasks     Standing balance-Leahy Scale: Zero                             ADL either performed or assessed with clinical judgement   ADL Overall ADL's : Needs assistance/impaired     Grooming: Wash/dry face;Oral care;Sitting;Supervision/safety;Set up Grooming Details (indicate cue type and reason): sitting in recliner                 Toilet Transfer: Maximal assistance;+2 for physical assistance Toilet Transfer Details (indicate cue type and reason): simulated via lateral scoot transfer to recliner         Functional mobility during ADLs: Maximal assistance;+2 for physical assistance General ADL Comments: pt continues to present with decreased activity tolerance, RUE weakness and decreased ROM, and cognitive impairments impacting pts ability to engage in [place]: Awake/alert  Behavior During Therapy: Restless Overall Cognitive Status: Impaired/Different from baseline Area of Impairment: Orientation;Memory;Attention;Following commands;Safety/judgement                 Orientation Level: Disoriented to;Place;Time;Situation (stating the NT from her nursing home was in her  room last night) Current Attention Level: Sustained Memory: Decreased short-term memory Following Commands: Follows one step commands inconsistently Safety/Judgement: Decreased awareness of safety;Decreased awareness of deficits   Problem Solving: Slow processing;Requires tactile cues;Decreased initiation;Difficulty sequencing;Requires verbal cues General Comments: Pt fidgety and irritable upon arrival to room, states "I can't get out of this feeding chair". Pt states she is in the nursing home, PT oriented pt to hospital and pt states "I know". 2 minutes later states she is in the nursing home and people have been coming into her room and scaring her at night. Pt requires multiomdal cuing for safety with mobility, and frequent cues.        Exercises  Other Exercises Other Exercises: PROM to RUE elbow, wrist and hand. encouraged Self ROM. pt with 3-/5 elbow flexion,   Shoulder Instructions       General Comments      Pertinent Vitals/ Pain       Pain Assessment: Faces Faces Pain Scale: Hurts little more Pain Location: R leg, "from my hip fracture" Pain Descriptors / Indicators: Sore;Aching Pain Intervention(s): Limited activity within patient's tolerance;Monitored during session;Repositioned  Home Living                                          Prior Functioning/Environment              Frequency  Min 2X/week        Progress Toward Goals  OT Goals(current goals can now be found in the care plan section)  Progress towards OT goals: Progressing toward goals  Acute Rehab OT Goals Patient Stated Goal: Use R hand again OT Goal Formulation: With patient Time For Goal Achievement: 12/20/19 Potential to Achieve Goals: Good  Plan Discharge plan remains appropriate;Frequency remains appropriate    Co-evaluation    PT/OT/SLP Co-Evaluation/Treatment: Yes Reason for Co-Treatment: For patient/therapist safety;To address functional/ADL  transfers;Necessary to address cognition/behavior during functional activity PT goals addressed during session: Mobility/safety with mobility;Balance;Strengthening/ROM OT goals addressed during session: ADL's and self-care      AM-PAC OT "6 Clicks" Daily Activity     Outcome Measure   Help from another person eating meals?: A Little Help from another person taking care of personal grooming?: A Little Help from another person toileting, which includes using toliet, bedpan, or urinal?: A Lot Help from another person bathing (including washing, rinsing, drying)?: A Lot Help from another person to put on and taking off regular upper body clothing?: A Lot Help from another person to put on and taking off regular lower body clothing?: A Lot 6 Click Score: 14    End of Session    OT Visit Diagnosis: Unsteadiness on feet (R26.81);Muscle weakness (generalized) (M62.81);Other symptoms and signs involving the nervous system (R29.898);Other symptoms and signs involving cognitive function;Hemiplegia and hemiparesis;Pain Hemiplegia - Right/Left: Right Hemiplegia - dominant/non-dominant: Dominant Hemiplegia - caused by: Cerebral infarction Pain - Right/Left: Right Pain - part of body: Leg   Activity Tolerance Patient tolerated treatment well   Patient Left in chair;with call bell/phone within reach;with chair alarm set   Nurse Communication Mobility status  Time: 8719-9412 OT Time Calculation (min): 29 min  Charges: OT General Charges $OT Visit: 1 Visit OT Treatments $Self Care/Home Management : 8-22 mins  Lanier Clam., COTA/L Acute Rehabilitation Services [phone]    Sara [name] 12/09/2019, 4:57 PM

## 2019-12-09 NOTE — TOC Progression Note (Signed)
Transition of Care [facility]) - Progression Note    Patient Details  Name: Sara [name] MRN: [id number] Date of Birth: [birth year]  Transition of Care [facility]) CM/SW Contact  Pollie Friar, RN Phone Number: 12/09/2019, 2:53 PM  Clinical Narrative:    [facility] can accept for rehab. Pt is regular [employer] so the facility has to obtain the insurance authorization. Will ask MD for new covid test since last was 6/18.  TOC following.   Expected Discharge Plan: [place] Barriers to Discharge: Continued Medical Work up, Ship broker  Expected Discharge Plan and Services Expected Discharge Plan: [place] arrangements for the past 2 months: Single Family Home                                       Social Determinants of Health (SDOH) Interventions    Readmission Risk Interventions No flowsheet data found.

## 2019-12-09 NOTE — Plan of Care (Signed)
  Problem: Activity: Goal: Risk for activity intolerance will decrease Outcome: Progressing   

## 2019-12-09 NOTE — Progress Notes (Addendum)
PROGRESS NOTE  Sara [name]  DOB: [birth year]  PCP: Townsend Roger, MD YBO:[id number]  DOA: 12/05/2019  LOS: 4 days   Chief Complaint  Patient presents with   Code Stroke   Brief narrative: Sara [name] is a 86 y.o. female with PMH of HTN, HLD, PAD, chronic diastolic CHF, A. fib, GI bleed, history of breast cancer. Patient presented to the ED on 12/05/2019 from home via EMS as a Code Stroke for acute onset of right sided weakness and slurred speech. Son dropped her off at home at 1 PM on 6/17 following a visit to her hairdresser. She was normal until 5 PM, when she abruptly became weak on the right and dropped the food she was cooking. When her son arrived home at 6 PM, he noticed slurred speech. After symptoms did not resolve in the next several hours, he called EMS at about 1:45 AM. On arrival by EMS, they noticed mild slurring of speech, but no focal weakness.  Vitals en route: CBG 133, BP 160s/100, O2 sat 95% on RA, A-fib on rhythm strip. She takes ASA at home. She was taken off Plavix 3 weeks ago for a GIB. Not on an anticoagulant. She has no prior history of stroke.   In the ER on exam patient has 3/5 strength in the right lower extremity and has decreased grip strength on the right upper extremity.  Patient failed swallow evaluation.   Seen by neurologist in ED. CT head was unremarkable. MRI brain was positive for scattered acute infarcts in the Left MCA territory, primarily cortical. No associated hemorrhage or mass effect. Subtle involvement of the left MCA/ACA watershed. MRA head and neck negative for large vessel occlusion, but positive for extensive intracranial atherosclerosis. Suspected High-grade distal Left ICA stenosis in the supraclinoid Segment. But no significant Left MCA or ACA stenosis or branch occlusion is identified. Severely stenotic non dominant distal Right Vertebral Artery, with up to moderate stenosis at the junction of the dominant Left Vertebrobasilar  junction. EKG showed A. fib with RVR for which patient was started on Cardizem infusion.   Patient was admitted under hospitalist service for stroke work-up   Subjective: Patient was seen and examined this morning.  Lying in bed.  Not in distress.   Patient continues to look weak every day on my assessment.  She continues to have right upper extremity as well as lower extremity weakness and dysarthria as well.  Mentation intact. She is in A. fib, she has episodes of RVR.  Tachycardic again this morning.  Assessment/Plan: Acute stroke -Presented with acute onset right-sided weakness and dysarthria.  Outside of TPA window -MRI brain positive for multiple scattered acute infarcts in the left MCA territory -6/20, patient complains of new right lower extremity weakness since the other day.  Repeat CT scan of head was obtained.  Did not show any new infarct.  It showed evolving nonhemorrhagic infarct from the previous insult. -Patient continues to have weakness. -Neurology consult and follow-up appreciated.  Patient was upgraded from aspirin to Eliquis.  Currently on Eliquis twice daily.  -Continue Zetia. -Echo 60 to 65% EF with mild LVH.   -Carotid duplex with nonsignificant stenosis bilaterally. -PT/OT/ST eval obtained.  SNF recommended. -Lipid panel with LDL 126, A1c 5.7.  TSH normal.    A. fib with RVR -Was started on Cardizem drip on admission, ultimately stopped. -Coreg was resumed at home dose of 18.75 mg twice daily. -For the episodes of tachycardia, this morning I decided to  increase the morning dose to 25 mg.  Evening dose to remain at 18.75 mg.  Continue to monitor heart rate and blood pressure. -Not on anticoagulation because of risk of fall and recent GI bleeding.  History of diastolic CHF Essential hypertension -Home meds include Coreg, Lasix 80 mg a.m. and 40 mg p.m. with potassium supplement. -Echocardiogram with EF 60 to 65%, mild LVH, elevated right ventricular systolic  pressure to 65. -Patient had high-grade distal ICA stenosis and is at risk of cerebral hypoperfusion with strict blood pressure control. Blood pressure is currently in 140s.  Lasix remains on hold. -Continue Coreg.  With increase in dose, monitor blood pressure trend, a low blood pressure may precipitate hypoperfusion and strokelike symptoms again.  Hypokalemia -Potassium level normal at 4.8 yesterday.  Repeat tomorrow.  Restless legs -Resume Requip  Mobility: PT/OT eval Code Status:   Code Status: Full Code per chart DVT prophylaxis:   apixaban (ELIQUIS) tablet 5 mg  Antimicrobials:  None Fluid: None  Diet:  Diet Order            Diet regular Room service appropriate? Yes with Assist; Fluid consistency: Thin  Diet effective now               Consultants: Neurology Family Communication:  None at bedside.  Called and left a voicemail for patient's son [name] this morning.  Status is: Inpatient Remains inpatient appropriate because of episodes of tachycardia, hypotension leading to recurrence of strokelike symptoms. Dispo: The patient is from: Home              Anticipated d/c is to: SNF              Anticipated d/c date is: 2 days              Patient currently is not medically stable to d/c.  Infusions:    Scheduled Meds:  apixaban  5 mg Oral BID   carvedilol  18.75 mg Oral QHS   [START ON 12/10/2019] carvedilol  25 mg Oral Daily   ezetimibe  10 mg Oral Daily   metoprolol tartrate  2.5 mg Intravenous Q6H   potassium chloride  20 mEq Oral BID   potassium chloride  40 mEq Oral Once   rOPINIRole  1.5 mg Oral QHS    Antimicrobials: Anti-infectives (From admission, onward)   None      PRN meds: acetaminophen **OR** acetaminophen (TYLENOL) oral liquid 160 mg/5 mL **OR** acetaminophen, hydrOXYzine, melatonin   Objective: Vitals:   12/09/19 0733 12/09/19 1020  BP: (!) 156/88 (!) 117/53  Pulse: 97 (!) 101  Resp: 19 18  Temp: (!) 97.4 F (36.3 C)  98.2 F (36.8 C)  SpO2: 96% 97%    Intake/Output Summary (Last 24 hours) at 12/09/2019 1046 Last data filed at 12/09/2019 1020 Gross per 24 hour  Intake 1138.27 ml  Output 825 ml  Net 313.27 ml   Filed Weights   12/05/19 1027  Weight: 68.6 kg   Weight change:  Body mass index is 29.54 kg/m.   Physical Exam: General exam: No physical distress.  Looks weak overall. Skin: No rashes, lesions or ulcers. HEENT: Atraumatic, normocephalic, supple neck, no obvious bleeding Lungs: Clear to auscultation bilaterally.  No crackles, no wheezing.   CVS: Rate controlled A. fib, no murmur GI/Abd soft, nontender, nondistended, bowel sound present CNS: Continues to have weakness of the right upper and lower extremity Psychiatry: Anxious  Extremities: No pedal edema, no calf tenderness  Data Review: I have personally reviewed the laboratory data and studies available.  Recent Labs  Lab 12/05/19 0230 12/05/19 0235 12/05/19 1057  WBC 7.7  --  6.5  NEUTROABS 5.4  --   --   HGB 15.0 14.6 14.9  HCT 46.2* 43.0 45.2  MCV 89.7  --  89.7  PLT 205  --  211   Recent Labs  Lab 12/05/19 0230 12/05/19 0235 12/05/19 0751 12/08/19 1506  NA 142 145  --  142  K 3.4* 3.0*  --  4.8  CL 109 108  --  114*  CO2 24  --   --  23  GLUCOSE 124* 124*  --  109*  BUN 19 20  --  13  CREATININE 0.78 0.80 0.70 0.66  CALCIUM 9.4  --   --  9.3  MG  --   --   --  2.2   No results for input(s): HGBA1C in the last 72 hours.   Lipid Panel     Component Value Date/Time   CHOL 182 12/05/2019 0751   TRIG 58 12/05/2019 0751   HDL 44 12/05/2019 0751   CHOLHDL 4.1 12/05/2019 0751   VLDL 12 12/05/2019 0751   LDLCALC 126 (H) 12/05/2019 0751    Signed, Terrilee Croak, MD [facility] Pager: [phone] (Secure Chat preferred). 12/09/2019

## 2019-12-10 DIAGNOSIS — I4891 Unspecified atrial fibrillation: Secondary | ICD-10-CM

## 2019-12-10 LAB — BASIC METABOLIC PANEL
Anion gap: 8 (ref 5–15)
BUN: 15 mg/dL (ref 8–23)
CO2: 25 mmol/L (ref 22–32)
Calcium: 9.8 mg/dL (ref 8.9–10.3)
Chloride: 110 mmol/L (ref 98–111)
Creatinine, Ser: 0.73 mg/dL (ref 0.44–1.00)
GFR calc Af Amer: >60 mL/min (ref >60)
GFR calc non Af Amer: >60 mL/min (ref >60)
Glucose, Bld: 116 mg/dL — ABNORMAL HIGH (ref 70–99)
Potassium: 4.1 mmol/L (ref 3.5–5.1)
Sodium: 143 mmol/L (ref 135–145)

## 2019-12-10 NOTE — TOC Progression Note (Signed)
Transition of Care [facility]) - Progression Note    Patient Details  Name: Sara [name] MRN: [id number] Date of Birth: 1925/12/01  Transition of Care [facility]) CM/SW [place], [place] Phone Number: 12/10/2019, 11:12 AM  Clinical Narrative:   CSW received update from Ethel that [facility] asked for updated therapy notes. CSW sent therapy notes from yesterday. Authorization still pending. CSW to follow.    Expected Discharge Plan: [place] Barriers to Discharge: Continued Medical Work up, Ship broker  Expected Discharge Plan and Services Expected Discharge Plan: [place] arrangements for the past 2 months: Single Family Home                                       Social Determinants of Health (SDOH) Interventions    Readmission Risk Interventions No flowsheet data found.

## 2019-12-10 NOTE — Plan of Care (Signed)
  Problem: Activity: Goal: Risk for activity intolerance will decrease Outcome: Progressing   Problem: Coping: Goal: Level of anxiety will decrease Outcome: Progressing   Problem: Pain Managment: Goal: General experience of comfort will improve Outcome: Progressing   Problem: Safety: Goal: Ability to remain free from injury will improve Outcome: Progressing   

## 2019-12-10 NOTE — Consult Note (Addendum)
Cardiology Consultation:   Patient ID: Janie Capp; [id number]; 19-Feb-1926   Admit date: 12/05/2019 Date of Consult: 12/10/2019  Primary Care Provider: Townsend Roger, MD Primary Cardiologist: Dr. Ellyn Hack, MD  Patient Profile:   Sanaz Scarlett is a 86 y.o. female with a hx of chronic diastolic heart failure exacerbated by Afib,chronicatrial fibrillation (not on anticoagulation due to her advanced age and fall risk), hypertension, hyperlipidemia, PAD, and a remote history of syncope who is being seen today for the evaluation of atrial fibrillation with CVA at the request of Dr. Pietro Cassis.  History of Present Illness:   Ms. Ponder is a 86 year old female with a history stated above who presented to [facility] on 12/05/2019 from home as a code stroke for acute onset right-sided weakness and slurred speech.  Per chart review, son dropped her off at home 1 PM on 12/04/2019 following a visit to her hairdresser and she was normal until 5 PM however abruptly became weak on the right side.  At the time of her son arrival by 6 PM he noticed slurred speech which did not resolve within several hours therefore EMS was called approximately 1:45 AM.  On ER arrival, head CT was found to be unremarkable however brain MRI was positive for scattered acute infarcts in the left MCA territory with no associated hemorrhage or mass-effect.  Echocardiogram performed with LVEF at 60 to 65% and mild LVH.  Carotid duplex arterial study with nonsignificant stenosis bilaterally.  Neurology is following.  Patient found to be in atrial fibrillation with RVR (however has known chronic atrial fibrillation) previously not on anticoagulation secondary to advanced age and high fall risk. She was placed on IV diltiazem infusion on admission which was ultimately stopped.  Her PTA carvedilol was resumed however was increased from home dose of 18.75 to 25 mg p.o. twice daily.  Cardiology asked to follow for atrial fibrillation with RVR.  Today she states that she is doing fairly well. She has no specific complaints of SOB, CP, dizziness, orthopnea, or LE edema. She is having issues with her restless leg syndrome. Continues to be in AF with rates in the low 100-130 range.   Ms. Holton is followed by Dr. Ellyn Hack for her general cardiology care as well as Dr. Gwenlyn Found for PAD.  She initially underwent peripheral arterial angiography and PTCA/PCI of the SFA 02/2018 at which time she was instructed to continue Plavix for at least 3 months.  She eventually underwent total knee arthroplasty 01/2955 with a complicated postoperative course including dizziness and bradycardia.  At that time, digoxin levels were found to be elevated therefore plan was to reduce digoxin dosing.  Her diltiazem was stopped and her carvedilol was decreased due to dizziness and bradycardia.  She was ultimately discharged on no cardiac medications other than ASA and Plavix.  At return cardiology follow-up 07/2018 patient was tachycardic with atrial fibrillation with RVR therefore carvedilol was restarted.  She has not been on anticoagulation for years mostly because the patient has initially refused, and then there was concern for bleeding risk and falls.  When she was placed on aspirin and Plavix for PAD, this was felt to be adequate for some protection as she was acceptable with aspirin Plavix.  However, she had what sounded like a large bloody stool about 3 weeks ago and self stopped her Plavix.  She was then seen in follow-up 08/23/2018 at which time her heart rate was poorly controlled.  Her carvedilol was increased to 18.75 mg  p.o. twice daily along with an increase in her Lasix to 80 mg twice daily for 2 days before going back to previous dosing to assist with lower extremity edema.  Unfortunately she only had temporary improvement therefore this was eventually increased to 80 mg in the a.m. and 40 mg in the p.m. permanently.    She was then referred to Dr. Gwenlyn Found for her  peripheral vascular disease due to abnormal lower extremity arterial Doppler studies performed and preoperative evaluation for elective total knee replacement.  She underwent peripheral angiography with intervention 03/18/2018 with PTA and DES to the left SFA CTO with two-vessel runoff.  Last follow-up with Dr. Gwenlyn Found, repeat lower extremity arterial Doppler studies from 11/22/2018 revealed a right ABI of 0.81 in the left at 1.07 with widely patent stenting. She has not been seen by her service since that time.  Past Medical History:  Diagnosis Date  . Ankle edema    Mild, which is intermittent, usually mildly dependent, left slightly greater than the right.  . Anxiety   . Arthritis    In her knees  . Balance problem    Due to weakened knee  . Chronic diastolic heart failure (HCC)    Class I-II -- exacerbated by Afib RVR  . Corneal edema    Severe corneal edema in right eye with multiple folds. Developed after cataract surgery 05/03/09  . Dysrhythmia    a-fib    since aghe 86 yrs old  . GERD (gastroesophageal reflux disease)   . Hematuria    Resolved after coumadin was stopped  . Hiatal hernia    With GERD  . History of breast cancer   . HOH (hard of hearing)   . Hyperglycemia    Random, without any history of diabetes  . Hyperlipidemia   . Hypertension    Difficult to control. Renal Doppler 06/02/10 showed less that 60% bilateral renal artery narrowing.  . Hypokalemia   . PAD (peripheral artery disease) ([place])   . Permanent atrial fibrillation (HCC)    Rate control.  ASA.  NOT on DOAC or warfarin due to concern for falls &bleeding.  Marland Kitchen Posthemorrhagic anemia   . Pseudophakia   . Syncope 02/22/11   During OV on 02/22/11 her INR was 7.3 and was given 2.5 mg of vitamin K. Later that day she became diaphoretic & fainted.  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 ABDOMINAL HYSTERECTOMY    . BREAST BIOPSY     3 biopsies  .  CARDIOVERSION N/A 03/31/2016   Procedure: CARDIOVERSION;  Surgeon: Jerline Pain, MD;  Location: [facility] ENDOSCOPY;  Service: Cardiovascular: Successful cardioversion from A. fib to sinus rhythm  . CATARACT EXTRACTION  05/03/09  . CHOLECYSTECTOMY    . EYE SURGERY     cataract  . HEMORRHOID SURGERY    . LOWER EXTREMITY ANGIOGRAPHY Right 03/18/2018   Procedure: LOWER EXTREMITY ANGIOGRAPHY;  Surgeon: Lorretta Harp, MD;  Location: [place] CV LAB;  Service: Cardiovascular;  Laterality: Right;  . PERIPHERAL VASCULAR INTERVENTION  03/18/2018   Procedure: PERIPHERAL VASCULAR INTERVENTION;  Surgeon: Lorretta Harp, MD;  Location: [place] CV LAB;  Service: Cardiovascular;;  left SFA  . Remote hysterectomy    . TEE WITHOUT CARDIOVERSION N/A 03/31/2016   Procedure: TRANSESOPHAGEAL ECHOCARDIOGRAM (TEE);  Surgeon: Jerline Pain, MD;  Location: [place];  Service: Cardiovascular:  EF 55-60%. No vegetation or thrombus okay  for DC CV  . TONSILLECTOMY    . TOTAL KNEE ARTHROPLASTY Right 08/26/10   By Dr. Elta Guadeloupe C. Yates. Cemented, computer assist  . TOTAL KNEE ARTHROPLASTY Left 07/08/2018   Procedure: LEFT TOTAL KNEE ARTHROPLASTY CEMENTED;  Surgeon: Marybelle Killings, MD;  Location: [place];  Service: Orthopedics;  Laterality: Left;  . TRANSTHORACIC ECHOCARDIOGRAM  03/2016    Mild LVH. EF 65-70%. High LVEDP no RWMA. Severe LA dilation. Peak PAP ~ 37 mmHg     Prior to Admission medications   Medication Sig Start Date End Date Taking? Authorizing Provider  aspirin EC 81 MG tablet Take 81 mg by mouth daily.   Yes [provider]  carvedilol (COREG) 12.5 MG tablet Take 1.5 tablets (18.75 mg total) by mouth 2 (two) times daily. 04/01/19 12/05/19 Yes Lorretta Harp, MD  furosemide (LASIX) 40 MG tablet Take 80 mg in the mornings and 40 mg in the evenings Patient taking differently: Take 40-80 mg by mouth See admin instructions. Take 80 mg in the mornings and 40 mg in the evenings 12/04/19  Yes Lorretta Harp, MD  potassium chloride SA (KLOR-CON) 20 MEQ tablet Take 2 tablets (40 mEq total) by mouth 2 (two) times daily. Patient taking differently: Take 20 mEq by mouth 2 (two) times daily.  05/21/19  Yes Lorretta Harp, MD  rOPINIRole (REQUIP) 1 MG tablet Take 1 tablet by mouth at bedtime.  02/18/16  Yes [provider]  clopidogrel (PLAVIX) 75 MG tablet TAKE 1 TABLET BY MOUTH ONCE (1) DAILY WITH BREAKFAST Patient not taking: Reported on 12/05/2019 05/23/19   Tommie Raymond, NP    Inpatient Medications: Scheduled Meds: . apixaban  5 mg Oral BID  . carvedilol  18.75 mg Oral QHS  . carvedilol  25 mg Oral Daily  . ezetimibe  10 mg Oral Daily  . metoprolol tartrate  2.5 mg Intravenous Q6H  . potassium chloride  20 mEq Oral BID  . potassium chloride  40 mEq Oral Once  . rOPINIRole  1.5 mg Oral QHS   Continuous Infusions:  PRN Meds: acetaminophen **OR** acetaminophen (TYLENOL) oral liquid 160 mg/5 mL **OR** acetaminophen, hydrOXYzine, melatonin  Allergies:    Allergies  Allergen Reactions  . Amiodarone Shortness Of Breath and Other (See Comments)    Stopped in Feb 2017 secondary to pulmonary complications  . Aliskiren Other (See Comments) and Nausea Only    unknown  . Amlodipine Besylate-Valsartan Other (See Comments) and Nausea Only    Unclear  . Bystolic [Nebivolol Hcl] Other (See Comments)    unknown  . Clonidine Derivatives Other (See Comments)    unknown  . Dronedarone Nausea And Vomiting and Nausea Only  . Hctz [Hydrochlorothiazide] Other (See Comments)    Currently taking without problem; question if this has to do with hypokalemia  . Hydralazine Other (See Comments)    Also tolerated, but had orthostatic changes on standing medication  . Lisinopril Other (See Comments) and Nausea Only    unknown  . Nebivolol Nausea Only  . Olmesartan Nausea Only and Other (See Comments)    Unclear of the intolerance  . Oxycodone     Patient states decreased heart rate,  decreased blood pressure. List as allergy.  . Rythmol [Propafenone] Other (See Comments)    unknown  . Statins Other (See Comments) and Nausea Only    unknown  . Carvedilol Other (See Comments) and Nausea And Vomiting    fatigue  . Prednisone Nausea And Vomiting  and Other (See Comments)    Jittery     Social History:   Social History   Socioeconomic History  . Marital status: Widowed    Spouse name: Not on file  . Number of children: 1  . Years of education: Not on file  . Highest education level: Not on file  Occupational History    Employer: RETIRED   Tobacco Use  . Smoking status: Never Smoker  . Smokeless tobacco: Never Used  Vaping Use  . Vaping Use: Never used  Substance and Sexual Activity  . Alcohol use: No  . Drug use: No  . Sexual activity: Not on file  Other Topics Concern  . Not on file  Social History Narrative   She is a widowed mother of one and grandmother of one.  She does not exercise.  She does not smoke and does not drink alcohol.   Social Determinants of Health   Financial Resource Strain:   . Difficulty of Paying Living Expenses:   Food Insecurity:   . Worried About Charity fundraiser in the Last Year:   . Arboriculturist in the Last Year:   Transportation Needs:   . Film/video editor (Medical):   Marland Kitchen Lack of Transportation (Non-Medical):   Physical Activity:   . Days of Exercise per Week:   . Minutes of Exercise per Session:   Stress:   . Feeling of Stress :   Social Connections:   . Frequency of Communication with Friends and Family:   . Frequency of Social Gatherings with Friends and Family:   . Attends Religious Services:   . Active Member of Clubs or Organizations:   . Attends Archivist Meetings:   Marland Kitchen Marital Status:   Intimate Partner Violence:   . Fear of Current or Ex-Partner:   . Emotionally Abused:   Marland Kitchen Physically Abused:   . Sexually Abused:     Family History:   Family History  Problem Relation Age of  Onset  . Cancer Mother        Mouth  . Hypertension Mother   . Coronary artery disease Brother   . Hypertension Brother   . Diabetes type II Brother   . Heart attack Sister   . Hypertension Sister    Family Status:  Family Status  Relation Name Status  . Mother  Deceased at age 61  . Father  Deceased at age 31       "Killed"  . Brother  Deceased at age 83  . Sister  (Not Specified)    ROS:  Please see the history of present illness.  All other ROS reviewed and negative.     Physical Exam/Data:   Vitals:   12/10/19 0600 12/10/19 0830 12/10/19 0931 12/10/19 1131  BP: (!) 147/107 (!) 135/101 135/88 (!) 103/51  Pulse:  (!) 101 97 98  Resp: 20 20 20 18   Temp: 98.2 F (36.8 C) 98.6 F (37 C) 97.8 F (36.6 C) 98.2 F (36.8 C)  TempSrc: Oral Axillary Oral Oral  SpO2:  96% 93% 91%  Weight:      Height:        Intake/Output Summary (Last 24 hours) at 12/10/2019 1331 Last data filed at 12/10/2019 0900 Gross per 24 hour  Intake 580 ml  Output 475 ml  Net 105 ml   Filed Weights   12/05/19 1027  Weight: 68.6 kg   Body mass index is 29.54 kg/m.   General: Elderly,  Well, NAD Skin: Warm, dry, intact  Neck: Negative for carotid bruits. No JVD Lungs:Clear to ausculation bilaterally. No wheezes. Breathing is unlabored. Cardiovascular: Irregularly irregular with S1 S2. No murmur Abdomen: Soft, non-tender, non-distended. No obvious abdominal masses. Extremities: No edema. Radial pulses 2+ bilaterally Neuro: Alert and oriented. No focal deficits. No facial asymmetry. MAE spontaneously. Psych: Responds to questions appropriately with normal affect.    EKG:  The EKG was personally reviewed and demonstrates: 12/05/19 AF with RBBB, HR 113bpm  Telemetry:  Telemetry was personally reviewed and demonstrates:  12/10/19 AF with HR's in the 100-120 range   Relevant CV Studies:  ECHO: 12/05/19:   1. Left ventricular ejection fraction, by estimation, is 60 to 65%. The  left  ventricle has normal function. The left ventricle has no regional  wall motion abnormalities. There is mild left ventricular hypertrophy.  Left ventricular diastolic function  could not be evaluated.  2. Right ventricular systolic function is mildly reduced. The right  ventricular size is normal. There is severely elevated pulmonary artery  systolic pressure. The estimated right ventricular systolic pressure is  32.3 mmHg.  3. Left atrial size was moderately dilated.  4. The mitral valve is normal in structure. Mild to moderate mitral valve  regurgitation. No evidence of mitral stenosis.  5. The tricuspid valve is myxomatous. Tricuspid valve regurgitation is  mild to moderate.  6. The aortic valve is normal in structure. Aortic valve regurgitation is  mild. No aortic stenosis is present.  7. The inferior vena cava is dilated in size with <50% respiratory  variability, suggesting right atrial pressure of 15 mmHg.   Laboratory Data:  Chemistry Recent Labs  Lab 12/05/19 0230 12/05/19 0230 12/05/19 0235 12/05/19 0751 12/08/19 1506 12/10/19 0352  NA 142   < > 145  --  142 143  K 3.4*   < > 3.0*  --  4.8 4.1  CL 109   < > 108  --  114* 110  CO2 24  --   --   --  23 25  GLUCOSE 124*   < > 124*  --  109* 116*  BUN 19   < > 20  --  13 15  CREATININE 0.78   < > 0.80 0.70 0.66 0.73  CALCIUM 9.4  --   --   --  9.3 9.8  GFRNONAA >60   < >  --  >60 >60 >60  GFRAA >60   < >  --  >60 >60 >60  ANIONGAP 9  --   --   --  5 8   < > = values in this interval not displayed.    Total Protein  Date Value Ref Range Status  12/05/2019 7.0 6.5 - 8.1 g/dL Final   Albumin  Date Value Ref Range Status  12/05/2019 3.8 3.5 - 5.0 g/dL Final   AST  Date Value Ref Range Status  12/05/2019 24 15 - 41 U/L Final   ALT  Date Value Ref Range Status  12/05/2019 15 0 - 44 U/L Final   Alkaline Phosphatase  Date Value Ref Range Status  12/05/2019 77 38 - 126 U/L Final   Total Bilirubin  Date  Value Ref Range Status  12/05/2019 1.3 (H) 0.3 - 1.2 mg/dL Final   Hematology Recent Labs  Lab 12/05/19 0230 12/05/19 0235 12/05/19 1057  WBC 7.7  --  6.5  RBC 5.15*  --  5.04  HGB 15.0 14.6 14.9  HCT 46.2* 43.0 45.2  MCV 89.7  --  89.7  MCH 29.1  --  29.6  MCHC 32.5  --  33.0  RDW 13.8  --  13.9  PLT 205  --  211   Cardiac EnzymesNo results for input(s): TROPONINI in the last 168 hours. No results for input(s): TROPIPOC in the last 168 hours.  BNPNo results for input(s): BNP, PROBNP in the last 168 hours.  DDimer No results for input(s): DDIMER in the last 168 hours. TSH:  Lab Results  Component Value Date   TSH 1.769 12/05/2019   Lipids: Lab Results  Component Value Date   CHOL 182 12/05/2019   HDL 44 12/05/2019   LDLCALC 126 (H) 12/05/2019   TRIG 58 12/05/2019   CHOLHDL 4.1 12/05/2019   HgbA1c: Lab Results  Component Value Date   HGBA1C 5.7 (H) 12/05/2019    Radiology/Studies:  CT HEAD WO CONTRAST  Result Date: 12/07/2019 CLINICAL DATA:  Stroke.  New left upper extremity symptoms. EXAM: CT HEAD WITHOUT CONTRAST TECHNIQUE: Contiguous axial images were obtained from the base of the skull through the vertex without intravenous contrast. COMPARISON:  MR head without contrast 12/05/2019. FINDINGS: Brain: Evolving nonhemorrhagic infarct of the left middle frontal gyrus is noted. Focal infarct of the posterior left insular ribbon is also noted. Diffuse white matter disease is stable. Remote infarct of the left thalamus is noted. No new infarcts are present. No hemorrhage is noted. The ventricles are of normal size. No significant extraaxial fluid collection is present. Vascular: Atherosclerotic changes are noted within the cavernous internal carotid arteries and at the dural margin of both vertebral arteries. No hyperdense vessel is associated. Skull: Calvarium is intact. No focal lytic or blastic lesions are present. No significant extracranial soft tissue lesion is present.  Sinuses/Orbits: The paranasal sinuses and mastoid air cells are clear. The globes and orbits are within normal limits. IMPRESSION: 1. Evolving nonhemorrhagic infarcts of the left middle frontal gyrus and posterior left insular ribbon. 2. No significant new infarct evident by CT. 3. Stable atrophy and white matter disease. 4. Remote infarct of the left thalamus. 5. Atherosclerosis. Electronically Signed   By: San Morelle M.D.   On: 12/07/2019 11:34   Assessment and Plan:   1. Chronic atrial fibrillation, now with RVR: She has known difficulty with rate control of her A. fib with obvious tachybradycardia.  Not meeting adequate patient for pacemaker placement, we have been allowing for some intermittent tachycardia and avoiding bradycardia.  She has had standing heart rates in the high 90s to low 110s in clinic and has been asymptomatic. -Patient has a known history of chronic atrial fibrillation not previously on systemic anticoagulation secondary to advanced age and bleeding risk. On most recent cardiology follow-up appointments 08/2018 patient's carvedilol was titrated for elevated rates. -Initially on hospital presentation, patient was placed on IV diltiazem however this has since been discontinued with replacement of PTA carvedilol however dose increased to 25 mg p.o. twice daily -Patient [facility] 12/05/2019 with acute onset of right-sided weakness and slurred speech found to have scattered acute infarcts in the left MCA territory with no associated hemorrhage or mass-effect per brain MRI.   -Echocardiogram performed with LVEF at 60 to 65% and mild LVH.   -Carotid duplex arterial study with nonsignificant stenosis bilaterally.  Neurology is following -Eliquis 5 mg p.o. twice daily initiated  -With concern for hypotension with increased dose of carvedilol, recommend: stop Carvedilol for now to allow for permissive HTN -Per initial recommendation, I had  recommended start IV Amio 150  bolus>>>then start 200mg  PO BID thereafter for better rate control -> however, this was not done since the heart rates came down. -->  If there is continual concerned about heart rates, I do think that amiodarone is the better option to avoid hypotension.  2.  Chronic diastolic congestive heart failure: -Echocardiogram from 12/05/2019 with LVEF at 60 to 65% with mild LVH, left ventricular diastolic function could not be evaluated secondary to atrial fibrillation, severely elevated PA pressure 65.1 mmHg, moderate LAE, mild to moderate MR, mild to moderate TR, mild AR -Follow with serial echocardiogram to get new assessment.  However with tachycardia, would prefer to wait until her heart rate is better controlled. -Appears euvolemic on exam today -suspect that dyspnea is more related to tachycardia with diastolic function than true acute diastolic heart failure symptoms.  3.  Acute L MCA CVA: -As above, initially presented 12/05/2019 with acute right-sided weakness and slurred speech found to have scattered acute infarcts in the left MCA territory with no associated hemorrhage or mass-effect per prior MRI -Placed on high-dose ASA 325 mg p.o. daily per neurology team -Recommendations from neurology are to initiate Eliquis along with aspirin, 3 days post stroke for stroke prevention at which time ASA can be discontinued per chart review  3.  Hypertension: -Stable, 103/51, 135/88, 135/101 -Continue current regimen  4.  Hyperlipidemia: -LDL, 126 -Continue high intensity atorvastatin  5. PAD: -Prior peripheral arterial angiography and PTCA/PCI of the SFA 02/2018 -Followed by Dr. Gwenlyn Found -Last repeat lower extremity arterial Doppler studies from 11/22/2018 revealed a right ABI of 0.81 in the left at 1.07 with widely patent stenting. She has not been seen by her service since that time.   For questions or updates, please contact [place] Please consult [email or web address] for contact info under  Cardiology/STEMI.   SignedKathyrn Drown NP-C [facility] Pager: [phone] 12/10/2019 1:31 PM   ATTENDING ATTESTATION  I have seen, examined and evaluated the patient this PM along with Kathyrn Drown, NP-C.  After reviewing all the available data and chart, we discussed the patients laboratory, study & physical findings as well as symptoms in detail. I agree with her findings, examination as well as impression recommendations as per our discussion.    Attending adjustments noted in italics.   We are consulted for difficult control atrial fibrillation in a patient with known permanent A. fib with plans to avoid anticoagulation because of bleeding issues in the past. -->  Will defer recommendation to start DOAC to neurology. ->  Would be best cannot have aspirin and Plavix on board.  Initial telephone recommendations were to use amiodarone, however this was not done, instead as needed metoprolol and injections were written for.  This is also a viable option, however if rates become very difficult, amiodarone is more likely to have consistent coverage.  --> She does have chronic diastolic function, and this may be mildly exacerbated by A. fib RVR, but she is euvolemic on exam with no CHF exacerbation.  At this point since her heart rate seem to be relatively well controlled on current medications, current plan is acceptable, however we need to monitor for low blood pressures with high dose of carvedilol.   Will reassess tomorrow to see if new recommendations are necessary.     Glenetta Hew, M.D., M.S. Interventional Cardiologist   Pager # [phone] Phone # [phone] [address] [place], [place] [address]

## 2019-12-10 NOTE — Progress Notes (Signed)
PROGRESS NOTE  Sara [name]  DOB: Apr 13, 1926  PCP: Sara Roger, MD ENI:[id number]  DOA: 12/05/2019  LOS: 5 days   Chief Complaint  Patient presents with  . Code Stroke   Brief narrative: Sara [name] is a 86 y.o. female with PMH of HTN, HLD, PAD, chronic diastolic CHF, A. fib, GI bleed, history of breast cancer. Patient presented to the ED on 12/05/2019 from home via EMS as a Code Stroke for acute onset of right sided weakness and slurred speech. Son dropped her off at home at 1 PM on 6/17 following a visit to her hairdresser. She was normal until 5 PM, when she abruptly became weak on the right and dropped the food she was cooking. When her son arrived home at 6 PM, he noticed slurred speech. After symptoms did not resolve in the next several hours, he called EMS at about 1:45 AM. On arrival by EMS, they noticed mild slurring of speech, but no focal weakness.  Vitals en route: CBG 133, BP 160s/100, O2 sat 95% on RA, A-fib on rhythm strip. She takes ASA at home. She was taken off Plavix 3 weeks ago for a GIB. Not on an anticoagulant. She has no prior history of stroke.   In the ER on exam patient has 3/5 strength in the right lower extremity and has decreased grip strength on the right upper extremity.  Patient failed swallow evaluation.   Seen by neurologist in ED. CT head was unremarkable. MRI brain was positive for scattered acute infarcts in the Left MCA territory, primarily cortical. No associated hemorrhage or mass effect. Subtle involvement of the left MCA/ACA watershed. MRA head and neck negative for large vessel occlusion, but positive for extensive intracranial atherosclerosis. Suspected High-grade distal Left ICA stenosis in the supraclinoid Segment. But no significant Left MCA or ACA stenosis or branch occlusion is identified. Severely stenotic non dominant distal Right Vertebral Artery, with up to moderate stenosis at the junction of the dominant Left Vertebrobasilar  junction. EKG showed A. fib with RVR for which patient was started on Cardizem infusion.   Patient was admitted under hospitalist service for stroke work-up   Subjective: Patient was seen and examined this morning.  Lying in bed.  Not in distress.   Patient continues to have persistent weakness in the right side. She is persistently tachycardic even on increasing the dose of Coreg.  Assessment/Plan: Acute stroke -Presented with acute onset right-sided weakness and dysarthria.  Outside of TPA window -MRI brain positive for multiple scattered acute infarcts in the left MCA territory -6/20, patient complains of new right lower extremity weakness since the other day.  Repeat CT scan of head was obtained.  Did not show any new infarct.  It showed evolving nonhemorrhagic infarct from the previous insult. -Patient continues to have weakness. -Neurology consult and follow-up appreciated.  Patient was upgraded from aspirin to Eliquis.  Currently on Eliquis twice daily.  -Continue Zetia. -Echo 60 to 65% EF with mild LVH.   -Carotid duplex with nonsignificant stenosis bilaterally. -PT/OT/ST eval obtained.  SNF recommended. -Lipid panel with LDL 126, A1c 5.7.  TSH normal.    A. fib with RVR -Patient has history of A. fib and was on Coreg at home.   -On admission, she was in RVR and was started on Cardizem drip.  -Coreg was resumed, dose was increased because of persistent tachycardia. -Patient still remains tachycardic.  Further increase in dose of Coreg is most likely going to improve the  blood pressure risking cerebral hypoperfusion.  Patient may benefit from other antiarrhythmic.  Cardiology consultation called. -Not on anticoagulation because of risk of fall and recent GI bleeding.  History of diastolic CHF Essential hypertension -Home meds include Coreg, Lasix 80 mg a.m. and 40 mg p.m. with potassium supplement. -Echocardiogram with EF 60 to 65%, mild LVH, elevated right ventricular systolic  pressure to 65. -Patient had high-grade distal ICA stenosis and is at risk of cerebral hypoperfusion with strict blood pressure control. Blood pressure is currently in 140s.  Lasix remains on hold.  Hypokalemia -Potassium level normal at 4.1 today.  Restless legs -Resume Requip  Mobility: PT/OT eval obtained.  SNF recommended. Code Status:   Code Status: Full Code per chart DVT prophylaxis:   apixaban (ELIQUIS) tablet 5 mg  Antimicrobials:  None Fluid: None  Diet:  Diet Order            Diet regular Room service appropriate? Yes with Assist; Fluid consistency: Thin  Diet effective now               Consultants: Neurology Family Communication:  None at bedside.  Called and left a voicemail for patient's son Mr. Sara [name] this morning.  Status is: Inpatient Remains inpatient appropriate because of persistent tachycardia, new antiarrhythmic medication.   Dispo: The patient is from: Home              Anticipated d/c is to: SNF              Anticipated d/c date is: 2 days              Patient currently is not medically stable to d/c.  Infusions:    Scheduled Meds: . apixaban  5 mg Oral BID  . carvedilol  25 mg Oral Daily  . ezetimibe  10 mg Oral Daily  . metoprolol tartrate  2.5 mg Intravenous Q6H  . potassium chloride  20 mEq Oral BID  . potassium chloride  40 mEq Oral Once  . rOPINIRole  1.5 mg Oral QHS    Antimicrobials: Anti-infectives (From admission, onward)   None      PRN meds: acetaminophen **OR** acetaminophen (TYLENOL) oral liquid 160 mg/5 mL **OR** acetaminophen, hydrOXYzine, melatonin   Objective: Vitals:   12/10/19 0931 12/10/19 1131  BP: 135/88 (!) 103/51  Pulse: 97 98  Resp: 20 18  Temp: 97.8 F (36.6 C) 98.2 F (36.8 C)  SpO2: 93% 91%    Intake/Output Summary (Last 24 hours) at 12/10/2019 1408 Last data filed at 12/10/2019 0900 Gross per 24 hour  Intake 580 ml  Output 475 ml  Net 105 ml   Filed Weights   12/05/19 1027  Weight:  68.6 kg   Weight change:  Body mass index is 29.54 kg/m.   Physical Exam: General exam: No physical distress.  Looks weak overall. Skin: No rashes, lesions or ulcers. HEENT: Atraumatic, normocephalic, supple neck, no obvious bleeding Lungs: Clear to auscultation bilaterally.  No crackles, no wheezing.   CVS: Tachycardic from A. fib, no murmur GI/Abd soft, nontender, nondistended, bowel sound present CNS: Continues to have weakness of the right upper and lower extremity Psychiatry: Anxious  Extremities: No pedal edema, no calf tenderness  Data Review: I have personally reviewed the laboratory data and studies available.  Recent Labs  Lab 12/05/19 0230 12/05/19 0235 12/05/19 1057  WBC 7.7  --  6.5  NEUTROABS 5.4  --   --   HGB 15.0 14.6 14.9  HCT  46.2* 43.0 45.2  MCV 89.7  --  89.7  PLT 205  --  211   Recent Labs  Lab 12/05/19 0230 12/05/19 0235 12/05/19 0751 12/08/19 1506 12/10/19 0352  NA 142 145  --  142 143  K 3.4* 3.0*  --  4.8 4.1  CL 109 108  --  114* 110  CO2 24  --   --  23 25  GLUCOSE 124* 124*  --  109* 116*  BUN 19 20  --  13 15  CREATININE 0.78 0.80 0.70 0.66 0.73  CALCIUM 9.4  --   --  9.3 9.8  MG  --   --   --  2.2  --    No results for input(s): HGBA1C in the last 72 hours.   Lipid Panel     Component Value Date/Time   CHOL 182 12/05/2019 0751   TRIG 58 12/05/2019 0751   HDL 44 12/05/2019 0751   CHOLHDL 4.1 12/05/2019 0751   VLDL 12 12/05/2019 0751   LDLCALC 126 (H) 12/05/2019 0751    Signed, Terrilee Croak, MD [facility] Pager: [phone] (Secure Chat preferred). 12/10/2019

## 2019-12-11 MED ORDER — AMIODARONE HCL 200 MG PO TABS
200.0000 mg | ORAL_TABLET | Freq: Every day | ORAL | Status: DC
  Administered 2019-12-12: 200 mg via ORAL
  Filled 2019-12-11: qty 1

## 2019-12-11 MED ORDER — AMIODARONE IV BOLUS ONLY 150 MG/100ML: 150.0000 mg | Freq: Once | INTRAVENOUS | Status: DC

## 2019-12-11 MED ORDER — AMIODARONE IV BOLUS ONLY 150 MG/100ML
150.0000 mg | Freq: Once | INTRAVENOUS | Status: AC
  Administered 2019-12-11: 150 mg via INTRAVENOUS
  Filled 2019-12-11: qty 100

## 2019-12-11 MED ORDER — CARVEDILOL 12.5 MG PO TABS
25.0000 mg | ORAL_TABLET | Freq: Two times a day (BID) | ORAL | Status: DC
  Administered 2019-12-11 – 2019-12-12 (×3): 25 mg via ORAL
  Filled 2019-12-11 (×3): qty 2

## 2019-12-11 NOTE — Progress Notes (Signed)
Physical Therapy Treatment Patient Details Name: Sara [name] MRN: [id number] DOB: May 28, 1926 Today's Date: 12/11/2019    History of Present Illness 86 yo admitted with right weakness outside the window for tPA with scattered Left MCA infarcts. Repeat CT scan per pt report of increased RLE weakness 12/07/19 showed evolving nonhemorrhagic infarct from the previous insult. PMHx: HTN, AFib, CHF, GIB, breast CA, HOH    PT Comments    Patient is making progress toward PT goals. Pt able to stand with max A +2 and Stedy standing frame this session. Continue to progress as tolerated with anticipated d/c to SNF for further skilled PT services.     Follow Up Recommendations  SNF     Equipment Recommendations  Other (comment) (tbd)    Recommendations for Other Services OT consult;Rehab consult     Precautions / Restrictions Precautions Precautions: Fall Precaution Comments: right hemiparesis, RLE flexor synergy  Restrictions Weight Bearing Restrictions: No    Mobility  Bed Mobility Overal bed mobility: Needs Assistance Bed Mobility: Supine to Sit     Supine to sit: Max assist;+2 for physical assistance;+2 for safety/equipment;HOB elevated     General bed mobility comments: cues for sequencing; assist to bring R LE/hips to EOB with bed pad and then to elevate trunk into sitting   Transfers Overall transfer level: Needs assistance   Transfers: Sit to/from Stand Sit to Stand: Max assist;+2 physical assistance         General transfer comment: assist to power up into standing with use of bed pad to facilitate hip extension and for weight shifting anteriorly; R UE supported by therapist; assist needed for bilat LE positioning prior to stand   Ambulation/Gait                 Stairs             Wheelchair Mobility    Modified Rankin (Stroke Patients Only) Modified Rankin (Stroke Patients Only) Pre-Morbid Rankin Score: Slight disability Modified Rankin:  Severe disability     Balance Overall balance assessment: Needs assistance Sitting-balance support: Feet supported;Single extremity supported Sitting balance-Leahy Scale: Poor Sitting balance - Comments: min-max A required for sitting balance EOB; worked on weight shifting to midline and trunk flexion Postural control: Right lateral lean;Posterior lean   Standing balance-Leahy Scale: Zero                              Cognition Arousal/Alertness: Awake/alert Behavior During Therapy: WFL for tasks assessed/performed Overall Cognitive Status: No family/caregiver present to determine baseline cognitive functioning Area of Impairment: Memory;Following commands;Safety/judgement                     Memory: Decreased short-term memory Following Commands: Follows one step commands consistently;Follows one step commands with increased time Safety/Judgement: Decreased awareness of safety;Decreased awareness of deficits   Problem Solving: Slow processing;Requires tactile cues;Decreased initiation;Difficulty sequencing;Requires verbal cues        Exercises      General Comments General comments (skin integrity, edema, etc.): pt has rash on her back      Pertinent Vitals/Pain Pain Assessment: Faces Faces Pain Scale: Hurts little more Pain Location: R knee while using Stedy  Pain Descriptors / Indicators: Sore;Grimacing;Guarding Pain Intervention(s): Monitored during session;Repositioned    Home Living                      Prior Function  PT Goals (current goals can now be found in the care plan section) Progress towards PT goals: Progressing toward goals    Frequency    Min 3X/week      PT Plan Current plan remains appropriate    Co-evaluation PT/OT/SLP Co-Evaluation/Treatment: Yes Reason for Co-Treatment: Necessary to address cognition/behavior during functional activity;For patient/therapist safety;To address functional/ADL  transfers PT goals addressed during session: Mobility/safety with mobility;Balance        AM-PAC PT "6 Clicks" Mobility   Outcome Measure  Help needed turning from your back to your side while in a flat bed without using bedrails?: A Lot Help needed moving from lying on your back to sitting on the side of a flat bed without using bedrails?: Total Help needed moving to and from a bed to a chair (including a wheelchair)?: Total Help needed standing up from a chair using your arms (e.g., wheelchair or bedside chair)?: Total Help needed to walk in hospital room?: Total Help needed climbing 3-5 steps with a railing? : Total 6 Click Score: 7    End of Session Equipment Utilized During Treatment: Gait belt Activity Tolerance: Patient tolerated treatment well Patient left: in chair;with chair alarm set;with call bell/phone within reach Nurse Communication: Mobility status;Need for lift equipment PT Visit Diagnosis: Other abnormalities of gait and mobility (R26.89);Difficulty in walking, not elsewhere classified (R26.2);Hemiplegia and hemiparesis Hemiplegia - Right/Left: Right Hemiplegia - dominant/non-dominant: Dominant Hemiplegia - caused by: Cerebral infarction     Time: 6256-3893 PT Time Calculation (min) (ACUTE ONLY): 32 min  Charges:  $Neuromuscular Re-education: 8-22 mins                     Earney Navy, PTA Acute Rehabilitation Services Pager: [phone] Office: [phone]     Darliss Cheney 12/11/2019, 11:38 AM

## 2019-12-11 NOTE — Progress Notes (Signed)
PROGRESS NOTE  Sara [name]  DOB: 1926-02-14  PCP: Townsend Roger, MD JME:[id number]  DOA: 12/05/2019  LOS: 6 days   Chief Complaint  Patient presents with  . Code Stroke   Brief narrative: Sara [name] is a 86 y.o. female with PMH of HTN, HLD, PAD, chronic diastolic CHF, A. fib, GI bleed, history of breast cancer. Patient presented to the ED on 12/05/2019 from home via EMS as a Code Stroke for acute onset of right sided weakness and slurred speech. Son dropped her off at home at 1 PM on 6/17 following a visit to her hairdresser. She was normal until 5 PM, when she abruptly became weak on the right and dropped the food she was cooking. When her son arrived home at 6 PM, he noticed slurred speech. After symptoms did not resolve in the next several hours, he called EMS at about 1:45 AM. On arrival by EMS, they noticed mild slurring of speech, but no focal weakness.  Vitals en route: CBG 133, BP 160s/100, O2 sat 95% on RA, A-fib on rhythm strip. She takes ASA at home. She was taken off Plavix 3 weeks ago for a GIB. Not on an anticoagulant. She has no prior history of stroke.   In the ER on exam patient has 3/5 strength in the right lower extremity and has decreased grip strength on the right upper extremity.  Patient failed swallow evaluation.   Seen by neurologist in ED. CT head was unremarkable. MRI brain was positive for scattered acute infarcts in the Left MCA territory, primarily cortical. No associated hemorrhage or mass effect. Subtle involvement of the left MCA/ACA watershed. MRA head and neck negative for large vessel occlusion, but positive for extensive intracranial atherosclerosis. Suspected High-grade distal Left ICA stenosis in the supraclinoid Segment. But no significant Left MCA or ACA stenosis or branch occlusion is identified. Severely stenotic non dominant distal Right Vertebral Artery, with up to moderate stenosis at the junction of the dominant Left Vertebrobasilar  junction. EKG showed A. fib with RVR for which patient was started on Cardizem infusion.   Patient was admitted under hospitalist service for stroke work-up   Subjective: Patient was seen and examined this morning. Lying down in bed. Not in distress.  Patient continues to have persistent weakness in the right side. Cardiology consulted for tachycardia management.    Assessment/Plan: Acute stroke -Presented with acute onset right-sided weakness and dysarthria.  Outside of TPA window -MRI brain positive for multiple scattered acute infarcts in the left MCA territory -6/20, patient complains of new right lower extremity weakness since the other day.  Repeat CT scan of head was obtained.  Did not show any new infarct.  It showed evolving nonhemorrhagic infarct from the previous insult. -Patient continues to have weakness on the right extremities. -Neurology consult and follow-up appreciated.  Patient was upgraded from aspirin to Eliquis.  Currently on Eliquis twice daily.  -Continue Zetia. -Echo 60 to 65% EF with mild LVH.   -Carotid duplex with nonsignificant stenosis bilaterally. -PT/OT/ST eval obtained.  SNF recommended. -Lipid panel with LDL 126, A1c 5.7.  TSH normal.    A. fib with RVR -Patient has history of A. fib and was on Coreg at home.   -On admission, she was in RVR and was started on Cardizem drip.  -Coreg was resumed, dose was increased because of persistent tachycardia. -Patient continues to remain tachycardic.  Cardiology consultation was obtained.  Patient was loaded with amiodarone.  Plan is to  continue elevated dose of Coreg at 25 mg twice daily..  Noted no plan to continue midodrine as an outpatient because of associated risks and poor tolerance in the past.   History of diastolic CHF Essential hypertension -Home meds include Coreg, Lasix 80 mg a.m. and 40 mg p.m. with potassium supplement. -Echocardiogram with EF 60 to 65%, mild LVH, elevated right ventricular systolic  pressure to 65. -Patient had high-grade distal ICA stenosis and is at risk of cerebral hypoperfusion with strict blood pressure control. Blood pressure is currently in 140s.  Lasix remains on hold.  Restless legs -Resume Requip  Mobility: PT/OT eval obtained.  SNF recommended. Code Status:   Code Status: Full Code per chart DVT prophylaxis:   apixaban (ELIQUIS) tablet 5 mg  Antimicrobials:  None Fluid: None  Diet:  Diet Order            Diet regular Room service appropriate? Yes with Assist; Fluid consistency: Thin  Diet effective now               Consultants: Neurology Family Communication:  None at bedside.  Called and left a voicemail for patient's son [name] this morning.  Status is: Inpatient Remains inpatient appropriate because of intermittent RVR and ongoing medication adjustment.  Dispo: The patient is from: Home              Anticipated d/c is to: SNF              Anticipated d/c date is: 2 days              Patient currently is not medically stable to d/c.  Infusions:    Scheduled Meds: . [START ON 12/12/2019] amiodarone  200 mg Oral Daily  . apixaban  5 mg Oral BID  . carvedilol  25 mg Oral BID WC  . ezetimibe  10 mg Oral Daily  . potassium chloride  20 mEq Oral BID  . rOPINIRole  1.5 mg Oral QHS    Antimicrobials: Anti-infectives (From admission, onward)   None      PRN meds: acetaminophen **OR** acetaminophen (TYLENOL) oral liquid 160 mg/5 mL **OR** acetaminophen, hydrOXYzine, melatonin   Objective: Vitals:   12/11/19 1002 12/11/19 1211  BP: 128/74 (!) 121/54  Pulse:    Resp:  18  Temp: 98.3 F (36.8 C) 98.2 F (36.8 C)  SpO2: 98% 96%    Intake/Output Summary (Last 24 hours) at 12/11/2019 1532 Last data filed at 12/11/2019 0900 Gross per 24 hour  Intake 480 ml  Output 675 ml  Net -195 ml   Filed Weights   12/05/19 1027  Weight: 68.6 kg   Weight change:  Body mass index is 29.54 kg/m.   Physical Exam: General exam: No  physical distress. Skin: No rashes, lesions or ulcers. HEENT: Atraumatic, normocephalic, supple neck, no obvious bleeding Lungs: Clear to auscultation bilaterally.  No crackles, no wheezing.   CVS: Rate controlled A. fib, no murmur.  GI/Abd soft, nontender, nondistended, bowel sound present CNS: Continues to have weakness of the right upper and lower extremity Psychiatry: Anxious  Extremities: No pedal edema, no calf tenderness  Data Review: I have personally reviewed the laboratory data and studies available.  Recent Labs  Lab 12/05/19 0230 12/05/19 0235 12/05/19 1057  WBC 7.7  --  6.5  NEUTROABS 5.4  --   --   HGB 15.0 14.6 14.9  HCT 46.2* 43.0 45.2  MCV 89.7  --  89.7  PLT 205  --  211   Recent Labs  Lab 12/05/19 0230 12/05/19 0235 12/05/19 0751 12/08/19 1506 12/10/19 0352  NA 142 145  --  142 143  K 3.4* 3.0*  --  4.8 4.1  CL 109 108  --  114* 110  CO2 24  --   --  23 25  GLUCOSE 124* 124*  --  109* 116*  BUN 19 20  --  13 15  CREATININE 0.78 0.80 0.70 0.66 0.73  CALCIUM 9.4  --   --  9.3 9.8  MG  --   --   --  2.2  --    No results for input(s): HGBA1C in the last 72 hours.   Lipid Panel     Component Value Date/Time   CHOL 182 12/05/2019 0751   TRIG 58 12/05/2019 0751   HDL 44 12/05/2019 0751   CHOLHDL 4.1 12/05/2019 0751   VLDL 12 12/05/2019 0751   LDLCALC 126 (H) 12/05/2019 0751    Signed, Terrilee Croak, MD [facility] Pager: [phone] (Secure Chat preferred). 12/11/2019

## 2019-12-11 NOTE — Progress Notes (Addendum)
Progress Note  Patient Name: Sara [name] Date of Encounter: 12/11/2019  Primary Cardiologist: Dr. Ellyn Hack, MD  Subjective   Doing well today. No specific complaints. Wants to go home   Inpatient Medications    Scheduled Meds: . apixaban  5 mg Oral BID  . carvedilol  25 mg Oral Daily  . ezetimibe  10 mg Oral Daily  . metoprolol tartrate  2.5 mg Intravenous Q6H  . potassium chloride  20 mEq Oral BID  . potassium chloride  40 mEq Oral Once  . rOPINIRole  1.5 mg Oral QHS   Continuous Infusions:  PRN Meds: acetaminophen **OR** acetaminophen (TYLENOL) oral liquid 160 mg/5 mL **OR** acetaminophen, hydrOXYzine, melatonin   Vital Signs    Vitals:   12/11/19 0645 12/11/19 0811 12/11/19 0932 12/11/19 1002  BP: (!) 123/95 135/76 (!) 151/76 128/74  Pulse: (!) 102 100    Resp: (!) 21 17    Temp: 98.4 F (36.9 C) 98 F (36.7 C)  98.3 F (36.8 C)  TempSrc: Oral Oral    SpO2: 93%   98%  Weight:      Height:        Intake/Output Summary (Last 24 hours) at 12/11/2019 1007 Last data filed at 12/11/2019 0636 Gross per 24 hour  Intake 360 ml  Output 675 ml  Net -315 ml   Filed Weights   12/05/19 1027  Weight: 68.6 kg   Physical Exam   General: Elderly, sharp, NAD Neck: Negative for carotid bruits. No JVD Lungs:Clear to ausculation bilaterally. No wheezes, rales, or rhonchi. Breathing is unlabored. Cardiovascular: RRR with S1 S2. + murmurs Abdomen: Soft, non-tender, non-distended. No obvious abdominal masses. Extremities: No edema. Radial pulses 2+ bilaterally Neuro: Alert and oriented. No focal deficits. No facial asymmetry. MAE spontaneously. Psych: Responds to questions appropriately with normal affect.    Labs    Chemistry Recent Labs  Lab 12/05/19 0230 12/05/19 0230 12/05/19 0235 12/05/19 0751 12/08/19 1506 12/10/19 0352  NA 142   < > 145  --  142 143  K 3.4*   < > 3.0*  --  4.8 4.1  CL 109   < > 108  --  114* 110  CO2 24  --   --   --  23 25    GLUCOSE 124*   < > 124*  --  109* 116*  BUN 19   < > 20  --  13 15  CREATININE 0.78   < > 0.80 0.70 0.66 0.73  CALCIUM 9.4  --   --   --  9.3 9.8  PROT 7.0  --   --   --   --   --   ALBUMIN 3.8  --   --   --   --   --   AST 24  --   --   --   --   --   ALT 15  --   --   --   --   --   ALKPHOS 77  --   --   --   --   --   BILITOT 1.3*  --   --   --   --   --   GFRNONAA >60   < >  --  >60 >60 >60  GFRAA >60   < >  --  >60 >60 >60  ANIONGAP 9  --   --   --  5 8   < > =  values in this interval not displayed.     Hematology Recent Labs  Lab 12/05/19 0230 12/05/19 0235 12/05/19 1057  WBC 7.7  --  6.5  RBC 5.15*  --  5.04  HGB 15.0 14.6 14.9  HCT 46.2* 43.0 45.2  MCV 89.7  --  89.7  MCH 29.1  --  29.6  MCHC 32.5  --  33.0  RDW 13.8  --  13.9  PLT 205  --  211    Cardiac EnzymesNo results for input(s): TROPONINI in the last 168 hours. No results for input(s): TROPIPOC in the last 168 hours.   BNPNo results for input(s): BNP, PROBNP in the last 168 hours.   DDimer No results for input(s): DDIMER in the last 168 hours.   Radiology    No results found.  Telemetry    12/11/19 AF with rates in the 90-110 range  - Personally Reviewed  ECG    No new tracing as of 12/11/19 - Personally Reviewed  Cardiac Studies   ECHO: 12/05/19:  1. Left ventricular ejection fraction, by estimation, is 60 to 65%. The  left ventricle has normal function. The left ventricle has no regional  wall motion abnormalities. There is mild left ventricular hypertrophy.  Left ventricular diastolic function  could not be evaluated.  2. Right ventricular systolic function is mildly reduced. The right  ventricular size is normal. There is severely elevated pulmonary artery  systolic pressure. The estimated right ventricular systolic pressure is  34.9 mmHg.  3. Left atrial size was moderately dilated.  4. The mitral valve is normal in structure. Mild to moderate mitral valve  regurgitation. No  evidence of mitral stenosis.  5. The tricuspid valve is myxomatous. Tricuspid valve regurgitation is  mild to moderate.  6. The aortic valve is normal in structure. Aortic valve regurgitation is  mild. No aortic stenosis is present.  7. The inferior vena cava is dilated in size with <50% respiratory  variability, suggesting right atrial pressure of 15 mmHg.   Patient Profile     86 y.o. female with a hx of chronic diastolic heart failure exacerbated by Afib,chronicatrial fibrillation (not on anticoagulation due to her advanced age and fall risk), hypertension, hyperlipidemia, PAD, and a remote history of syncope who is being seen today for the evaluation of atrial fibrillation with CVA at the request of Dr. Pietro Cassis.  Assessment & Plan    1. Chronic atrial fibrillation, now with RVR:  -Has known difficulty with rate control with obvious tachybradycardia>>not a candidate for PPM placement  -Patient presented to [facility] 12/05/2019 with acute onset of right-sided weakness and slurred speech found to have scattered acute infarcts in the left MCA territory with no associated hemorrhage or mass-effect per brain MRI.   -Echocardiogram performed with LVEF at 60 to 65% and mild LVH.   -Eliquis 5 mg p.o. twice daily initiated  -With concern for hypotension with increased dose of carvedilol, plan was to stop carvedilol. Initial recommendation, was for IV Amio 150 bolus>>>then start 200mg  PO BID thereafter while in the hospital for better rate control however, this was not done due to HR stabilization  -> would not continue in the outpatient setting based on prior history for intolerance. -If there is continual concerned about heart rates, I do think that amiodarone is the better option to avoid hypotension. -Rates continue to be variable however pt asymptomatic   2.  (Acute on) Chronic diastolic congestive heart failure: -Echocardiogram from 12/05/2019 with LVEF at 60 to 65%  with mild LVH, left  ventricular diastolic function could not be evaluated secondary to atrial fibrillation, severely elevated PA pressure 65.1 mmHg, moderate LAE, mild to moderate MR, mild to moderate TR, mild AR -Follow with serial echocardiogram to get new assessment.  However with tachycardia, would prefer to wait until her heart rate is better controlled. -Appears euvolemic on exam today -> no sign of CHF exacerbation  3.  Acute L MCA CVA: -As above, initially presented 12/05/2019 with acute right-sided weakness and slurred speech found to have scattered acute infarcts in the left MCA territory with no associated hemorrhage or mass-effect per prior MRI -Placed on high-dose ASA 325 mg p.o. daily per neurology team -Recommendations from neurology are to initiate Eliquis along with aspirin, 3 days post stroke for stroke prevention at which time ASA can be discontinued per chart review  3.  Hypertension: -Stable, 128/74>151/76>135/76 -Continue current regimen  4.  Hyperlipidemia: -LDL, 126 -Continue high intensity atorvastatin  5. PAD: -Prior peripheral arterial angiography and PTCA/PCI of the SFA 02/2018 -Followed by Dr. Gwenlyn Found -Last repeat lower extremity arterial Doppler studies from 11/22/2018 revealed a right ABI of 0.81 in the left at 1.07 with widely patent stenting. She has not been seen by her service since that time.  Signed, Kathyrn Drown NP-C [facility] Pager: [phone] 12/11/2019, 10:07 AM    ATTENDING ATTESTATION  I have seen, examined and evaluated the patient this AM on rounds along with Kathyrn Drown, NP.  After reviewing all the available data and chart, we discussed the patients laboratory, study & physical findings as well as symptoms in detail. I agree with her findings, examination as well as impression recommendations as per our discussion.    Attending adjustments noted in italics.    Situation with a woman who has been had a history of longstanding persistent/permanent A. fib.   In the past we have tried multiple different treatment options.  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  She has been adamant about not wanting take anticoagulation, and has had issues with falls and GI bleeds therefore we decided just to simply treat with the aspirin Plavix that she was on for her PAD.  Interestingly, she is stopped her Plavix because of what she thought was a GI bleed 3 weeks prior to her stroke.  She is now presented with left-sided stroke and right-sided hemiparesis.  We are consulted for tachycardic A. fib.  She runs in the rates of high 90s to low 100s 110s in the outpatient setting as well.  Limited options because she did not tolerate Coreg 25 mg twice daily in the outpatient setting.  Here in the hospital she has been treated with Coreg increased to 25 mg twice daily with as needed metoprolol, still has tachycardic spells.  At this point I think we can try to slow her down load with amiodarone as noted above.  But would not continue as an outpatient.  We can see how she does on the higher dose of carvedilol in the outpatient setting, but would not have any other antihypertensives on board.  Agree with all other plans.    Glenetta Hew, M.D., M.S. Interventional Cardiologist   Pager # [phone] Phone # [phone] [address] [place], [place] [address]     For questions or updates, please contact   Please consult [email or web address] for contact info under Cardiology/STEMI.

## 2019-12-11 NOTE — Progress Notes (Signed)
  Speech Language Pathology Treatment: Cognitive-Linquistic  Patient Details Name: Sara [name] MRN: [id number] DOB: 1925-08-03 Today's Date: 12/11/2019 Time: 3794-3276 SLP Time Calculation (min) (ACUTE ONLY): 28 min  Assessment / Plan / Recommendation Clinical Impression  Sara [name] demonstrates improved cognition compared to evaluation date one week ago. She was oriented x4 and recalled daily events 4/4. She was noted with some intermittent attention deficits and short term memory deficits. She repeated herself asking questions x2 during session. She was provided a handout of memory strategies (WRAPS) and adamantly refused any memory difficulty, stating that she has perfect memory. Pt was unable to recall thsi clinician's name despite cues for use of strategies (association strategy of "Sara [name] works on [employer]" to associate the M's. Pt was reluctant to utilize strategy, stating she didn't need it, but was unable to recall SLP's name after 5 minute delay. Pt reports she handles all medications and finances independently, but that she lives with her son who could help. Based on intermittent memory deficits and attention deficits, recommend supervision for IADL tasks. She does not drive baseline. No further ST indicated on acute level. Pt may benefit from further cognitive re-training at next venue of care to target mild cognitive deficits.   HPI HPI:  86 y.o. female presenting from home 6/18 via EMS as a Code Stroke for acute onset of right sided weakness and slurred speech.  PMHx includes chronic diastolic HF, A-fib, history of breast CA, HLD, HTN and PAD. MRI Positive for scattered acute infarcts in the Left MCA territory, primarily cortical. No associated hemorrhage or mass effect. Subtle involvement of the left MCA/ACA watershed.Marland Kitchen      SLP Plan  Discharge SLP treatment due to (comment)       Recommendations  Diet recommendations: Regular;Thin liquid            SLP Visit Diagnosis:  Cognitive communication deficit (D47.092) Plan: Discharge SLP treatment due to (comment)                    Sara [name] P. Sara [name], M.S., CCC-SLP Speech-Language Pathologist Acute Rehabilitation Services Pager: [place] 12/11/2019, 2:37 PM
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
## 2019-12-12 DIAGNOSIS — R0602 Shortness of breath: Secondary | ICD-10-CM | POA: Diagnosis not present

## 2019-12-12 DIAGNOSIS — R279 Unspecified lack of coordination: Secondary | ICD-10-CM | POA: Diagnosis not present

## 2019-12-12 DIAGNOSIS — I10 Essential (primary) hypertension: Secondary | ICD-10-CM | POA: Diagnosis not present

## 2019-12-12 DIAGNOSIS — Z8719 Personal history of other diseases of the digestive system: Secondary | ICD-10-CM | POA: Diagnosis not present

## 2019-12-12 DIAGNOSIS — G4700 Insomnia, unspecified: Secondary | ICD-10-CM | POA: Diagnosis not present

## 2019-12-12 DIAGNOSIS — I63413 Cerebral infarction due to embolism of bilateral middle cerebral arteries: Secondary | ICD-10-CM | POA: Diagnosis not present

## 2019-12-12 DIAGNOSIS — I739 Peripheral vascular disease, unspecified: Secondary | ICD-10-CM | POA: Diagnosis not present

## 2019-12-12 DIAGNOSIS — I119 Hypertensive heart disease without heart failure: Secondary | ICD-10-CM | POA: Diagnosis not present

## 2019-12-12 DIAGNOSIS — Z79899 Other long term (current) drug therapy: Secondary | ICD-10-CM | POA: Diagnosis not present

## 2019-12-12 DIAGNOSIS — R5381 Other malaise: Secondary | ICD-10-CM | POA: Diagnosis not present

## 2019-12-12 DIAGNOSIS — E785 Hyperlipidemia, unspecified: Secondary | ICD-10-CM | POA: Diagnosis not present

## 2019-12-12 DIAGNOSIS — E7800 Pure hypercholesterolemia, unspecified: Secondary | ICD-10-CM | POA: Diagnosis not present

## 2019-12-12 DIAGNOSIS — R609 Edema, unspecified: Secondary | ICD-10-CM | POA: Diagnosis not present

## 2019-12-12 DIAGNOSIS — Z7401 Bed confinement status: Secondary | ICD-10-CM | POA: Diagnosis not present

## 2019-12-12 DIAGNOSIS — D649 Anemia, unspecified: Secondary | ICD-10-CM | POA: Diagnosis not present

## 2019-12-12 DIAGNOSIS — M810 Age-related osteoporosis without current pathological fracture: Secondary | ICD-10-CM | POA: Diagnosis not present

## 2019-12-12 DIAGNOSIS — R278 Other lack of coordination: Secondary | ICD-10-CM | POA: Diagnosis not present

## 2019-12-12 DIAGNOSIS — I69351 Hemiplegia and hemiparesis following cerebral infarction affecting right dominant side: Secondary | ICD-10-CM | POA: Diagnosis not present

## 2019-12-12 DIAGNOSIS — I69322 Dysarthria following cerebral infarction: Secondary | ICD-10-CM | POA: Diagnosis not present

## 2019-12-12 DIAGNOSIS — R262 Difficulty in walking, not elsewhere classified: Secondary | ICD-10-CM | POA: Diagnosis not present

## 2019-12-12 DIAGNOSIS — I4891 Unspecified atrial fibrillation: Secondary | ICD-10-CM | POA: Diagnosis not present

## 2019-12-12 DIAGNOSIS — F419 Anxiety disorder, unspecified: Secondary | ICD-10-CM | POA: Diagnosis not present

## 2019-12-12 DIAGNOSIS — R41841 Cognitive communication deficit: Secondary | ICD-10-CM | POA: Diagnosis not present

## 2019-12-12 DIAGNOSIS — I4821 Permanent atrial fibrillation: Secondary | ICD-10-CM | POA: Diagnosis not present

## 2019-12-12 DIAGNOSIS — M6281 Muscle weakness (generalized): Secondary | ICD-10-CM | POA: Diagnosis not present

## 2019-12-12 DIAGNOSIS — F4320 Adjustment disorder, unspecified: Secondary | ICD-10-CM | POA: Diagnosis not present

## 2019-12-12 DIAGNOSIS — I5030 Unspecified diastolic (congestive) heart failure: Secondary | ICD-10-CM | POA: Diagnosis not present

## 2019-12-12 DIAGNOSIS — F411 Generalized anxiety disorder: Secondary | ICD-10-CM | POA: Diagnosis not present

## 2019-12-12 DIAGNOSIS — G459 Transient cerebral ischemic attack, unspecified: Secondary | ICD-10-CM | POA: Diagnosis not present

## 2019-12-12 DIAGNOSIS — R2689 Other abnormalities of gait and mobility: Secondary | ICD-10-CM | POA: Diagnosis not present

## 2019-12-12 DIAGNOSIS — I6339 Cerebral infarction due to thrombosis of other cerebral artery: Secondary | ICD-10-CM | POA: Diagnosis not present

## 2019-12-12 DIAGNOSIS — I639 Cerebral infarction, unspecified: Secondary | ICD-10-CM | POA: Diagnosis not present

## 2019-12-12 DIAGNOSIS — I5032 Chronic diastolic (congestive) heart failure: Secondary | ICD-10-CM | POA: Diagnosis not present

## 2019-12-12 DIAGNOSIS — K219 Gastro-esophageal reflux disease without esophagitis: Secondary | ICD-10-CM | POA: Diagnosis not present

## 2019-12-12 DIAGNOSIS — M2550 Pain in unspecified joint: Secondary | ICD-10-CM | POA: Diagnosis not present

## 2019-12-12 DIAGNOSIS — I480 Paroxysmal atrial fibrillation: Secondary | ICD-10-CM | POA: Diagnosis not present

## 2019-12-12 DIAGNOSIS — I4811 Longstanding persistent atrial fibrillation: Secondary | ICD-10-CM | POA: Diagnosis not present

## 2019-12-12 DIAGNOSIS — R531 Weakness: Secondary | ICD-10-CM | POA: Diagnosis not present

## 2019-12-12 MED ORDER — EZETIMIBE 10 MG PO TABS: 10.0000 mg | ORAL_TABLET | Freq: Every day | ORAL | Status: DC

## 2019-12-12 MED ORDER — CARVEDILOL 25 MG PO TABS: 25.0000 mg | ORAL_TABLET | Freq: Two times a day (BID) | ORAL | Status: DC

## 2019-12-12 MED ORDER — MELATONIN 5 MG PO TABS: 5.0000 mg | ORAL_TABLET | Freq: Every evening | ORAL | 0 refills | Status: AC | PRN

## 2019-12-12 MED ORDER — APIXABAN 5 MG PO TABS: 5.0000 mg | ORAL_TABLET | Freq: Two times a day (BID) | ORAL | Status: DC

## 2019-12-12 MED ORDER — POTASSIUM CHLORIDE 20 MEQ PO PACK: 20.0000 meq | PACK | Freq: Every day | ORAL | Status: DC

## 2019-12-12 NOTE — Progress Notes (Signed)
Report given to receiving RN.

## 2019-12-12 NOTE — Progress Notes (Signed)
Occupational Therapy Treatment Patient Details Name: Sara [name] MRN: [id number] DOB: April 07, 1926 Today's Date: 12/12/2019    History of present illness 86 yo admitted with right weakness outside the window for tPA with scattered Left MCA infarcts. Repeat CT scan per pt report of increased RLE weakness 12/07/19 showed evolving nonhemorrhagic infarct from the previous insult. PMHx: HTN, AFib, CHF, GIB, breast CA, HOH   OT comments  Pt making steady progress towards OT goals this session. Pt received supine in bed reporting incontinent episode. Pt requires MIN A +2 to roll to pts R side using LUE to reach for rail, but MOD A +2 to maintain sidelying position. Pt required total A for posterior pericare. Defer further mobility d/t fatigue.Noted pt for potential DC to SNF today, will follow acutely for OT needs.    Follow Up Recommendations  SNF    Equipment Recommendations  Other (comment) (defer to next venue of care)    Recommendations for Other Services      Precautions / Restrictions Precautions Precautions: Fall Precaution Comments: right hemiparesis, RLE flexor synergy  Restrictions Weight Bearing Restrictions: No       Mobility Bed Mobility Overal bed mobility: Needs Assistance Bed Mobility: Rolling Rolling: Min assist;Mod assist;+2 for physical assistance         General bed mobility comments: pt required MIN - MOD A to roll to R side using LUE to reach rail, MOD A to maintain sidelying position d/t decreased strength  Transfers                 General transfer comment: defer this session d/t fatigue from rolling for pericare    Balance                                           ADL either performed or assessed with clinical judgement   ADL Overall ADL's : Needs assistance/impaired                 Upper Body Dressing : Bed level;Total assistance Upper Body Dressing Details (indicate cue type and reason): to don new gown        Toilet Transfer Details (indicate cue type and reason): defer this session d/t fatigue Toileting- Clothing Manipulation and Hygiene: Total assistance;Bed level Toileting - Clothing Manipulation Details (indicate cue type and reason): to roll in bed for posterior pericare       General ADL Comments: pt continues to present with decreased activity toelrance, R sided weakness, cognitive impairments and decreased ability to care for self impacting pts ability to engage in BADLs. session limited to bed mobility this session as pt noted to be incontinent of bowels needing total A for posterior pericare     Vision       Perception     Praxis      Cognition Arousal/Alertness: Awake/alert Behavior During Therapy: WFL for tasks assessed/performed Overall Cognitive Status: No family/caregiver present to determine baseline cognitive functioning Area of Impairment: Following commands;Safety/judgement;Problem solving;Attention                       Following Commands: Follows one step commands consistently;Follows one step commands with increased time Safety/Judgement: Decreased awareness of safety;Decreased awareness of deficits   Problem Solving: Slow processing;Requires tactile cues;Decreased initiation;Difficulty sequencing;Requires verbal cues          Exercises  Shoulder Instructions       General Comments pt on 2L during session with O2 WFL    Pertinent Vitals/ Pain       Pain Assessment: Faces Faces Pain Scale: Hurts little more Pain Location: R knee with bed mobility Pain Descriptors / Indicators: Sore;Grimacing;Guarding Pain Intervention(s): Monitored during session;Repositioned  Home Living                                          Prior Functioning/Environment              Frequency           Progress Toward Goals  OT Goals(current goals can now be found in the care plan section)  Progress towards OT goals: Progressing  toward goals  Acute Rehab OT Goals Patient Stated Goal: Use R hand again OT Goal Formulation: With patient Time For Goal Achievement: 12/20/19 Potential to Achieve Goals: Good  Plan Discharge plan remains appropriate;Frequency remains appropriate    Co-evaluation                 AM-PAC OT "6 Clicks" Daily Activity     Outcome Measure   Help from another person eating meals?: A Little Help from another person taking care of personal grooming?: A Little Help from another person toileting, which includes using toliet, bedpan, or urinal?: A Lot Help from another person bathing (including washing, rinsing, drying)?: A Lot Help from another person to put on and taking off regular upper body clothing?: A Lot Help from another person to put on and taking off regular lower body clothing?: A Lot 6 Click Score: 14    End of Session Equipment Utilized During Treatment: Oxygen;Other (comment) (2L)  OT Visit Diagnosis: Unsteadiness on feet (R26.81);Muscle weakness (generalized) (M62.81);Other symptoms and signs involving the nervous system (R29.898);Other symptoms and signs involving cognitive function;Hemiplegia and hemiparesis;Pain Hemiplegia - Right/Left: Right Hemiplegia - dominant/non-dominant: Dominant Hemiplegia - caused by: Cerebral infarction Pain - Right/Left: Right Pain - part of body: Leg   Activity Tolerance Patient tolerated treatment well   Patient Left in bed;with call bell/phone within reach;with bed alarm set   Nurse Communication Mobility status        Time: 0102-7253 OT Time Calculation (min): 28 min  Charges: OT General Charges $OT Visit: 1 Visit OT Treatments $Self Care/Home Management : 23-37 mins  Lanier Clam., COTA/L Acute Rehabilitation Services [phone]    Sara [name] 12/12/2019, 1:22 PM

## 2019-12-12 NOTE — Discharge Summary (Signed)
Physician Discharge Summary  Sara [name] XVQ:[id number] DOB: 04/12/1926 DOA: 12/05/2019  PCP: Townsend Roger, MD  Admit date: 12/05/2019 Discharge date: 12/12/2019  Admitted From: Home Discharge disposition: SNF   Code Status: Full Code  Diet Recommendation: Cardiac diet   Recommendations for Outpatient Follow-Up:   Follow-up with PCP as an outpatient.  Follow-up with neurology as an outpatient.   Discharge Diagnosis:   Principal Problem:   Acute CVA (cerebrovascular accident) [facility]) Active Problems:   Essential hypertension   Atrial fibrillation (HCC)   Chronic diastolic CHF (congestive heart failure), NYHA class 1 (HCC)   Cerebral infarction (HCC)   Syncope   PAD (peripheral artery disease) (HCC)   Benign essential HTN   Hyperglycemia   Hx of breast cancer   Gastroesophageal reflux disease   Chronic diastolic congestive heart failure (HCC)   Anxiety state   History of GI bleed    History of Present Illness / Brief narrative:  Sara [name] is a 86 y.o. female with PMH of HTN, HLD, PAD, chronic diastolic CHF, A. fib, GI bleed, history of breast cancer. Patient presented to the ED on 12/05/2019 from home via EMS as a Code Stroke for acute onset of right sided weakness and slurred speech. Son dropped her off at home at 1 PM on 6/17 following a visit to her hairdresser. She was normal until 5 PM, when she abruptly became weak on the right and dropped the food she was cooking. When her son arrived home at 6 PM, he noticed slurred speech. After symptoms did not resolve in the next several hours, he called EMS at about 1:45 AM. On arrival by EMS, they noticed mild slurring of speech, but no focal weakness.  Vitals en route: CBG 133, BP 160s/100, O2 sat 95% on RA, A-fib on rhythm strip. She takes ASA at home. She was taken off Plavix 3 weeks ago for a GIB. Not on an anticoagulant. She has no prior history ofstroke.   In the ER on exam patient has 3/5 strength in the  right lower extremity and has decreased grip strength on the right upper extremity. Patient failed swallow evaluation.  Seen by neurologist in ED. CT head was unremarkable. MRI brain was positive for scattered acute infarcts in the Left MCA territory, primarily cortical. No associated hemorrhage or mass effect. Subtle involvement of the left MCA/ACA watershed. MRA head and neck negative for large vessel occlusion, but positive for extensive intracranial atherosclerosis. Suspected High-grade distal Left ICA stenosis in the supraclinoid Segment. But no significant Left MCA or ACA stenosis or branch occlusion is identified. Severely stenotic non dominant distal Right Vertebral Artery, with up to moderate stenosis at the junction of the dominant Left Vertebrobasilar junction. EKG showed A. fib with RVR for which patient was started on Cardizem infusion.  Patient was admitted under hospitalist service for stroke work-up   Hospital Course:  Acute stroke -Presented with acute onset right-sided weakness and dysarthria.  Outside of TPA window -MRI brain positive for multiple scattered acute infarcts in the left MCA territory -6/20, patient complains of new right lower extremity weakness since the other day.  Repeat CT scan of head was obtained.  Did not show any new infarct.  It showed evolving nonhemorrhagic infarct from the previous insult. -Patient continues to have weakness on the right extremities. -Neurology consult and follow-up appreciated.  Patient was upgraded from aspirin to Eliquis.  Currently on Eliquis twice daily.  -Continue Zetia. -Echo 60 to 65% EF  with mild LVH.   -Carotid duplex with nonsignificant stenosis bilaterally. -Lipid panel with LDL 126, A1c 5.7.  TSH normal. -PT/OT/ST eval obtained.  SNF recommended.    A. fib with RVR -Patient has history of A. fib and was on Coreg at home.   -On admission, she was in RVR and was started on Cardizem drip.  -Coreg was resumed, dose  was increased because of persistent tachycardia. -Patient continues to remain tachycardic.  Cardiology consultation was obtained.  Patient was loaded with amiodarone.  She was also given another dose of amiodarone adone today. Plan is to continue elevated dose of Coreg at 25 mg twice daily. Cardiology recommended to avoid amiodarone as an outpatient because of associated risks and poor tolerance in the past.    History of diastolic CHF Essential hypertension -Home meds include Coreg, Lasix 80 mg a.m. and 40 mg p.m. with potassium supplement. -Echocardiogram with EF 60 to 65%, mild LVH, elevated right ventricular systolic pressure to 65. -Patient had high-grade distal ICA stenosis and is at risk of cerebral hypoperfusion with strict blood pressure control. Blood pressure is currently in 140s.  Lasix remains on hold.  I would not resume it at discharge.  Restless legs -Resume Requip.  Okay to discharge to SNF today.  Subjective:  Seen and examined this morning.  Pleasant elderly Caucasian female.  Sitting up in bed.  Not in distress.  Her right-sided weakness persists.  Discharge Exam:   Vitals:   12/12/19 0000 12/12/19 0400 12/12/19 0813 12/12/19 1240  BP: 114/67 130/64 125/87 (!) 141/76  Pulse: 97 99 96 84  Resp: 20  19 20   Temp: 97.9 F (36.6 C) 97.8 F (36.6 C)  98 F (36.7 C)  TempSrc:  Oral  Oral  SpO2: 98% 98% 93% 99%  Weight:      Height:        Body mass index is 29.54 kg/m.  General exam: Appears calm and comfortable.  Skin: No rashes, lesions or ulcers. HEENT: Atraumatic, normocephalic, supple neck, no obvious bleeding Lungs: Clear to auscultate bilaterally CVS: Regular rate and rhythm, no murmur.  Intermittent episodes of short-lived of tachycardia with agitation GI/Abd soft, nontender, nondistended, bowel sound present CNS: Alert, awake, oriented X 3, right-sided weakness Psychiatry: Mood appropriate Extremities: No pedal edema, no calf tenderness  Discharge  Instructions:  Wound care:      Discharge Instructions    Ambulatory referral to Neurology   Complete by: As directed    Follow up with stroke clinic NP (Jessica Vanschaick or Cecille Rubin, if both not available, consider Zachery Dauer, or Ahern) at [facility] in about 4 weeks. Thanks.   Diet - low sodium heart healthy   Complete by: As directed    Increase activity slowly   Complete by: As directed       Contact information for follow-up providers    [facility]. Schedule an appointment as soon as possible for a visit in 4 week(s).   Specialty: Neurology Contact information: [address] [place] [phone]       Townsend Roger, MD Follow up.   Specialty: Internal Medicine Contact information: [address] [place] [address] [phone]        Leonie Man, MD .   Specialty: Cardiology Contact information: [address] [place] [address] [phone]            Contact information for after-discharge care  Destination    [facility] .   Service: Skilled Nursing Contact information: [place] [phone]                 Allergies as of 12/12/2019      Reactions   Amiodarone Shortness Of Breath, Other (See Comments)   Stopped in Feb 2017 secondary to pulmonary complications   Aliskiren Other (See Comments), Nausea Only   unknown   Amlodipine Besylate-valsartan Other (See Comments), Nausea Only   Unclear   Bystolic [nebivolol Hcl] Other (See Comments)   unknown   Clonidine Derivatives Other (See Comments)   unknown   Dronedarone Nausea And Vomiting, Nausea Only   Hctz [hydrochlorothiazide] Other (See Comments)   Currently taking without problem; question if this has to do with hypokalemia   Hydralazine Other (See Comments)   Also tolerated, but had orthostatic changes on standing medication     Lisinopril Other (See Comments), Nausea Only   unknown   Nebivolol Nausea Only   Olmesartan Nausea Only, Other (See Comments)   Unclear of the intolerance   Oxycodone    Patient states decreased heart rate, decreased blood pressure. List as allergy.   Rythmol [propafenone] Other (See Comments)   unknown   Statins Other (See Comments), Nausea Only   unknown   Carvedilol Other (See Comments), Nausea And Vomiting   fatigue   Prednisone Nausea And Vomiting, Other (See Comments)   Jittery       Medication List    STOP taking these medications   aspirin EC 81 MG tablet   clopidogrel 75 MG tablet Commonly known as: PLAVIX   furosemide 40 MG tablet Commonly known as: LASIX   potassium chloride SA 20 MEQ tablet Commonly known as: KLOR-CON     TAKE these medications   apixaban 5 MG Tabs tablet Commonly known as: ELIQUIS Take 1 tablet (5 mg total) by mouth 2 (two) times daily.   carvedilol 25 MG tablet Commonly known as: COREG Take 1 tablet (25 mg total) by mouth 2 (two) times daily with a meal. What changed:   medication strength  how much to take  when to take this   ezetimibe 10 MG tablet Commonly known as: ZETIA Take 1 tablet (10 mg total) by mouth daily. Start taking on: December 13, 2019   melatonin 5 MG Tabs Take 1 tablet (5 mg total) by mouth at bedtime as needed (sleep).   potassium chloride 20 MEQ packet Commonly known as: KLOR-CON Take 20 mEq by mouth daily.   rOPINIRole 1 MG tablet Commonly known as: REQUIP Take 1 tablet by mouth at bedtime.       Time coordinating discharge: 35 minutes  The results of significant diagnostics from this hospitalization (including imaging, microbiology, ancillary and laboratory) are listed below for reference.    Procedures and Diagnostic Studies:   MR ANGIO HEAD WO CONTRAST  Result Date: 12/05/2019 CLINICAL DATA:  86 year old female code stroke presentation. Slurred speech and weakness. Scattered left MCA  cortical infarcts and subtle left MCA/ACA watershed involvement on MRI today. EXAM: MRA HEAD WITHOUT CONTRAST TECHNIQUE: Angiographic images of the Circle of Willis were obtained using MRA technique without intravenous contrast. COMPARISON:  MRI today reported separately. FINDINGS: Mildly degraded by motion artifact despite repeated imaging attempts. Antegrade flow in the posterior circulation with dominant appearing left vertebral artery and extensive irregularity and stenosis of the distal right vertebral artery. Tandem high-grade right V4 stenoses, but the  right vertebrobasilar junction is patent. There is a mild to moderate stenosis also at the left vertebrobasilar junction. Irregular and somewhat diminutive basilar artery. No high-grade basilar stenosis. Patent basilar tip. Fetal type right PCA origin. Tortuous left P1 segment. Bilateral PCA branches are mildly to moderately irregular, including both P2 segments. Antegrade flow in both ICA siphons. Bilateral siphon irregularity in keeping with atherosclerosis but there is asymmetric signal loss in the left supraclinoid ICA (series 1039, image 14) suggesting high-grade stenosis there. Carotid termini remain patent. MCA and ACA origins are patent. Anterior communicating artery and proximal A2 segments appear normal. The visible left ACA remains patent, with only mild left A2 stenosis. Right MCA bifurcates early without stenosis. No right MCA branch occlusion identified. Left MCA M1 and left MCA bifurcation appear patent without convincing stenosis. No left MCA branch occlusion is evident. IMPRESSION: 1. Negative for large vessel occlusion, but positive for extensive intracranial atherosclerosis: 2. Suspected High-grade distal Left ICA stenosis in the supraclinoid segment. But no significant Left MCA or ACA stenosis or branch occlusion is identified. 3. Severely stenotic non dominant distal Right Vertebral Artery, with up to moderate stenosis at the junction of  the dominant Left Vertebrobasilar junction. Electronically Signed   By: Genevie Ann M.D.   On: 12/05/2019 06:43   MR BRAIN WO CONTRAST  Result Date: 12/05/2019 CLINICAL DATA:  86 year old female code stroke presentation. Slurred speech and weakness. EXAM: MRI HEAD WITHOUT CONTRAST TECHNIQUE: Multiplanar, multiecho pulse sequences of the brain and surrounding structures were obtained without intravenous contrast. COMPARISON:  Head CT earlier this morning. [facility] Brain MRI 01/28/2015. FINDINGS: Brain: Positive for multiple scattered areas of mostly cortical restricted diffusion in the left frontal and posterior temporal lobes. There is a somewhat confluent area of involvement at the left inferior frontal gyrus on series 7, image 62. There is also some medial left superior frontal gyrus involvement suggested on image 83. Mild posterior left insula involvement. Occasional periventricular white matter involvement. No definite contralateral or posterior fossa restricted diffusion. Subtle T2 and FLAIR hyperintensity in the acutely affected areas with no hemorrhage or mass effect. Elsewhere stable since 2016 gray and white matter signal throughout the brain including increased perivascular spaces and mild for age nonspecific white matter and thalamic T2 heterogeneity. No chronic cortical encephalomalacia or chronic cerebral blood products. No midline shift, mass effect, evidence of mass lesion, ventriculomegaly, extra-axial collection or acute intracranial hemorrhage. Cervicomedullary junction and pituitary are within normal limits. Vascular: Major intracranial vascular flow voids are stable since 2016. Skull and upper cervical spine: Negative for age visible cervical spine, bone marrow signal. Sinuses/Orbits: Stable since 2016 and negative. Other: Stable mild right mastoid effusion. Visible internal auditory structures appear normal. Scalp and face soft tissues appear negative. IMPRESSION: 1. Positive for  scattered acute infarcts in the Left MCA territory, primarily cortical. No associated hemorrhage or mass effect. Subtle involvement of the left MCA/ACA watershed. See MRA today reported separately. 2. Otherwise stable since 2016 and largely negative for age noncontrast MRI appearance of the brain. Electronically Signed   By: Genevie Ann M.D.   On: 12/05/2019 06:36   ECHOCARDIOGRAM COMPLETE  Result Date: 12/05/2019    ECHOCARDIOGRAM REPORT   Patient Name:   LATIFAH PADIN Date of Exam: 12/05/2019 Medical Rec #:  [id number]        Height:       60.0 in Accession #:    [id number]       Weight:  151.2 lb Date of Birth:  02-25-1926       BSA:          1.658 m Patient Age:    50 years         BP:           126/68 mmHg Patient Gender: F                HR:           74 bpm. Exam Location:  Inpatient Procedure: 2D Echo, Cardiac Doppler and Color Doppler Indications:    Stroke  History:        Patient has prior history of Echocardiogram examinations, most                 recent 03/31/2016. CHF, Arrythmias:Atrial Fibrillation; Risk                 Factors:Hypertension. Acute CVA.  Sonographer:    Clayton Lefort RDCS (AE) Referring Phys: [id number] [place]  1. Left ventricular ejection fraction, by estimation, is 60 to 65%. The left ventricle has normal function. The left ventricle has no regional wall motion abnormalities. There is mild left ventricular hypertrophy. Left ventricular diastolic function could not be evaluated.  2. Right ventricular systolic function is mildly reduced. The right ventricular size is normal. There is severely elevated pulmonary artery systolic pressure. The estimated right ventricular systolic pressure is 01.7 mmHg.  3. Left atrial size was moderately dilated.  4. The mitral valve is normal in structure. Mild to moderate mitral valve regurgitation. No evidence of mitral stenosis.  5. The tricuspid valve is myxomatous. Tricuspid valve regurgitation is mild to moderate.  6. The aortic  valve is normal in structure. Aortic valve regurgitation is mild. No aortic stenosis is present.  7. The inferior vena cava is dilated in size with <50% respiratory variability, suggesting right atrial pressure of 15 mmHg. FINDINGS  Left Ventricle: Left ventricular ejection fraction, by estimation, is 60 to 65%. The left ventricle has normal function. The left ventricle has no regional wall motion abnormalities. The left ventricular internal cavity size was normal in size. There is  mild left ventricular hypertrophy. Left ventricular diastolic function could not be evaluated due to atrial fibrillation. Left ventricular diastolic function could not be evaluated. Right Ventricle: The right ventricular size is normal. No increase in right ventricular wall thickness. Right ventricular systolic function is mildly reduced. There is severely elevated pulmonary artery systolic pressure. The tricuspid regurgitant velocity is 3.54 m/s, and with an assumed right atrial pressure of 15 mmHg, the estimated right ventricular systolic pressure is 51.0 mmHg. Left Atrium: Left atrial size was moderately dilated. Right Atrium: Right atrial size was normal in size. Pericardium: There is no evidence of pericardial effusion. Mitral Valve: The mitral valve is normal in structure. There is mild thickening of the mitral valve leaflet(s). Normal mobility of the mitral valve leaflets. Mild mitral annular calcification. Mild to moderate mitral valve regurgitation, with centrally-directed jet. No evidence of mitral valve stenosis. Tricuspid Valve: The tricuspid valve is myxomatous. Tricuspid valve regurgitation is mild to moderate. No evidence of tricuspid stenosis. Aortic Valve: The aortic valve is normal in structure. Aortic valve regurgitation is mild. Aortic regurgitation PHT measures 586 msec. No aortic stenosis is present. Aortic valve mean gradient measures 3.0 mmHg. Aortic valve peak gradient measures 6.7 mmHg. Aortic valve area, by VTI  measures 1.72 cm. Pulmonic Valve: The pulmonic valve was normal in structure. Pulmonic valve regurgitation  is trivial. No evidence of pulmonic stenosis. Aorta: The aortic root is normal in size and structure. Venous: The inferior vena cava is dilated in size with less than 50% respiratory variability, suggesting right atrial pressure of 15 mmHg. IAS/Shunts: No atrial level shunt detected by color flow Doppler.  LEFT VENTRICLE PLAX 2D LVIDd:         3.80 cm LVIDs:         2.60 cm LV PW:         1.40 cm LV IVS:        1.30 cm LVOT diam:     1.70 cm LV SV:         45 LV SV Index:   27 LVOT Area:     2.27 cm  RIGHT VENTRICLE            IVC RV S prime:     7.18 cm/s  IVC diam: 2.80 cm TAPSE (M-mode): 1.6 cm LEFT ATRIUM              Index       RIGHT ATRIUM           Index LA diam:        4.30 cm  2.59 cm/m  RA Area:     20.20 cm LA Vol (A2C):   106.0 ml 63.95 ml/m RA Volume:   53.00 ml  31.97 ml/m LA Vol (A4C):   90.5 ml  54.60 ml/m LA Biplane Vol: 99.0 ml  59.73 ml/m  AORTIC VALVE AV Area (Vmax):    1.45 cm AV Area (Vmean):   1.42 cm AV Area (VTI):     1.72 cm AV Vmax:           129.00 cm/s AV Vmean:          86.000 cm/s AV VTI:            0.262 m AV Peak Grad:      6.7 mmHg AV Mean Grad:      3.0 mmHg LVOT Vmax:         82.40 cm/s LVOT Vmean:        53.700 cm/s LVOT VTI:          0.198 m LVOT/AV VTI ratio: 0.76 AI PHT:            586 msec  AORTA Ao Root diam: 3.20 cm Ao Asc diam:  3.20 cm TRICUSPID VALVE TR Peak grad:   50.1 mmHg TR Vmax:        354.00 cm/s  SHUNTS Systemic VTI:  0.20 m Systemic Diam: 1.70 cm Dani Gobble Croitoru MD Electronically signed by Sanda Klein MD Signature Date/Time: 12/05/2019/4:55:37 PM    Final    CT HEAD CODE STROKE WO CONTRAST  Result Date: 12/05/2019 CLINICAL DATA:  Code stroke. Initial evaluation for acute slurred speech, weakness. EXAM: CT HEAD WITHOUT CONTRAST TECHNIQUE: Contiguous axial images were obtained from the base of the skull through the vertex without intravenous  contrast. COMPARISON:  Prior study from 08/21/2015. FINDINGS: Brain: Generalized age-related cerebral atrophy with mild chronic small vessel ischemic disease. No acute intracranial hemorrhage. No acute large vessel territory infarct. No mass lesion, midline shift or mass effect. No hydrocephalus or extra-axial fluid collection. Vascular: No hyperdense vessel. Calcified atherosclerosis present at the skull base. Skull: Scalp soft tissues and calvarium within normal limits. Sinuses/Orbits: Globes and orbital soft tissues within normal limits. Paranasal sinuses are clear. No mastoid effusion. Other: None. ASPECTS [facility] Stroke Program Early CT  Score) - Ganglionic level infarction (caudate, lentiform nuclei, internal capsule, insula, M1-M3 cortex): 7 - Supraganglionic infarction (M4-M6 cortex): 3 Total score (0-10 with 10 being normal): 10 IMPRESSION: 1. No acute intracranial infarct or other abnormality. 2. ASPECTS is 10. 3. Mild age-related cerebral atrophy with chronic small vessel ischemic disease. These results were communicated to Dr. Cheral Marker at 2:47 amon 6/18/2021by text page via the [facility] messaging system. Electronically Signed   By: Jeannine Boga M.D.   On: 12/05/2019 02:48   VAS US CAROTID  Result Date: 12/08/2019 Carotid Arterial Duplex Study Indications:       CVA. Risk Factors:      Hypertension, hyperlipidemia. Other Factors:     Scattered acute infarcts in the Left MCA. Comparison Study:  none Performing Technologist: June Leap RDMS, RVT  Examination Guidelines: A complete evaluation includes B-mode imaging, spectral Doppler, color Doppler, and power Doppler as needed of all accessible portions of each vessel. Bilateral testing is considered an integral part of a complete examination. Limited examinations for reoccurring indications may be performed as noted.  Right Carotid Findings: +----------+--------+--------+--------+------------------+---------+           PSV cm/sEDV  cm/sStenosisPlaque DescriptionComments  +----------+--------+--------+--------+------------------+---------+ CCA Prox  98      13                                          +----------+--------+--------+--------+------------------+---------+ CCA Distal83      20                                          +----------+--------+--------+--------+------------------+---------+ ICA Prox  99      28      1-39%   calcific          Shadowing +----------+--------+--------+--------+------------------+---------+ ICA Distal82      18                                          +----------+--------+--------+--------+------------------+---------+ ECA       109     16                                          +----------+--------+--------+--------+------------------+---------+ +----------+--------+-------+----------------+-------------------+           PSV cm/sEDV cmsDescribe        Arm Pressure (mmHG) +----------+--------+-------+----------------+-------------------+ [id number]            Multiphasic, WNL                    +----------+--------+-------+----------------+-------------------+ +---------+--------+--+--------+-+---------+ VertebralPSV cm/s28EDV cm/s3Antegrade +---------+--------+--+--------+-+---------+  Left Carotid Findings: +----------+--------+--------+--------+------------------+---------+           PSV cm/sEDV cm/sStenosisPlaque DescriptionComments  +----------+--------+--------+--------+------------------+---------+ CCA Prox  80      17                                          +----------+--------+--------+--------+------------------+---------+ CCA Distal70      13                                          +----------+--------+--------+--------+------------------+---------+  ICA Prox  143     26      1-39%   calcific          Shadowing +----------+--------+--------+--------+------------------+---------+ ICA Distal103     24                                           +----------+--------+--------+--------+------------------+---------+ ECA       158     23                                          +----------+--------+--------+--------+------------------+---------+ +----------+--------+--------+----------------+-------------------+           PSV cm/sEDV cm/sDescribe        Arm Pressure (mmHG) +----------+--------+--------+----------------+-------------------+ [id number]              Multiphasic, WNL                    +----------+--------+--------+----------------+-------------------+ +---------+--------+--+--------+--+---------+ VertebralPSV cm/s56EDV cm/s14Antegrade +---------+--------+--+--------+--+---------+   Summary: Right Carotid: Velocities in the right ICA are consistent with a 1-39% stenosis. Left Carotid: Velocities in the left ICA are consistent with a 1-39% stenosis.               Upper end of range. Vertebrals:  Bilateral vertebral arteries demonstrate antegrade flow. Subclavians: Normal flow hemodynamics were seen in bilateral subclavian              arteries. *See table(s) above for measurements and observations.  Electronically signed by Antony Contras MD on 12/08/2019 at 1:33:53 PM.    Final      Labs:   Basic Metabolic Panel: Recent Labs  Lab 12/08/19 1506 12/10/19 0352  NA 142 143  K 4.8 4.1  CL 114* 110  CO2 23 25  GLUCOSE 109* 116*  BUN 13 15  CREATININE 0.66 0.73  CALCIUM 9.3 9.8  MG 2.2  --    GFR Estimated Creatinine Clearance: 39.6 mL/min (by C-G formula based on SCr of 0.73 mg/dL). Liver Function Tests: No results for input(s): AST, ALT, ALKPHOS, BILITOT, PROT, ALBUMIN in the last 168 hours. No results for input(s): LIPASE, AMYLASE in the last 168 hours. No results for input(s): AMMONIA in the last 168 hours. Coagulation profile No results for input(s): INR, PROTIME in the last 168 hours.  CBC: No results for input(s): WBC, NEUTROABS, HGB, HCT, MCV, PLT in the  last 168 hours. Cardiac Enzymes: No results for input(s): CKTOTAL, CKMB, CKMBINDEX, TROPONINI in the last 168 hours. BNP: Invalid input(s): POCBNP CBG: No results for input(s): GLUCAP in the last 168 hours. D-Dimer No results for input(s): DDIMER in the last 72 hours. Hgb A1c No results for input(s): HGBA1C in the last 72 hours. Lipid Profile No results for input(s): CHOL, HDL, LDLCALC, TRIG, CHOLHDL, LDLDIRECT in the last 72 hours. Thyroid function studies No results for input(s): TSH, T4TOTAL, T3FREE, THYROIDAB in the last 72 hours.  Invalid input(s): FREET3 Anemia work up No results for input(s): VITAMINB12, FOLATE, FERRITIN, TIBC, IRON, RETICCTPCT in the last 72 hours. Microbiology Recent Results (from the past 240 hour(s))  SARS Coronavirus 2 by RT PCR (hospital order, performed in [facility] hospital lab) Nasopharyngeal Nasopharyngeal Swab     Status: None   Collection Time: 12/05/19  2:49 AM   Specimen: Nasopharyngeal Swab  Result  Value Ref Range Status   SARS Coronavirus 2 NEGATIVE NEGATIVE Final    Comment: (NOTE) SARS-CoV-2 target nucleic acids are NOT DETECTED.  The SARS-CoV-2 RNA is generally detectable in upper and lower respiratory specimens during the acute phase of infection. The lowest concentration of SARS-CoV-2 viral copies this assay can detect is 250 copies / mL. A negative result does not preclude SARS-CoV-2 infection and should not be used as the sole basis for treatment or other patient management decisions.  A negative result may occur with improper specimen collection / handling, submission of specimen other than nasopharyngeal swab, presence of viral mutation(s) within the areas targeted by this assay, and inadequate number of viral copies (<250 copies / mL). A negative result must be combined with clinical observations, patient history, and epidemiological information.  Fact Sheet for Patients:   StrictlyIdeas.no  Fact  Sheet for Healthcare Providers: BankingDealers.co.za  This test is not yet approved or  cleared by the [place] FDA and has been authorized for detection and/or diagnosis of SARS-CoV-2 by FDA under an Emergency Use Authorization (EUA).  This EUA will remain in effect (meaning this test can be used) for the duration of the COVID-19 declaration under Section 564(b)(1) of the Act, 21 U.S.C. section 360bbb-3(b)(1), unless the authorization is terminated or revoked sooner.  Performed at [facility], [place] [address] [place], [place] [address]   SARS CORONAVIRUS 2 (TAT 6-24 HRS) Nasopharyngeal Nasopharyngeal Swab     Status: None   Collection Time: 12/09/19  5:04 PM   Specimen: Nasopharyngeal Swab  Result Value Ref Range Status   SARS Coronavirus 2 NEGATIVE NEGATIVE Final    Comment: (NOTE) SARS-CoV-2 target nucleic acids are NOT DETECTED.  The SARS-CoV-2 RNA is generally detectable in upper and lower respiratory specimens during the acute phase of infection. Negative results do not preclude SARS-CoV-2 infection, do not rule out co-infections with other pathogens, and should not be used as the sole basis for treatment or other patient management decisions. Negative results must be combined with clinical observations, patient history, and epidemiological information. The expected result is Negative.  Fact Sheet for Patients: SugarRoll.be  Fact Sheet for Healthcare Providers: https://www.woods-mathews.com/  This test is not yet approved or cleared by the [place] FDA and  has been authorized for detection and/or diagnosis of SARS-CoV-2 by FDA under an Emergency Use Authorization (EUA). This EUA will remain  in effect (meaning this test can be used) for the duration of the COVID-19 declaration under Se ction 564(b)(1) of the Act, 21 U.S.C. section 360bbb-3(b)(1), unless the authorization is terminated  or revoked sooner.  Performed at [facility], [place] [address] [place], [place] [address]     Please note: You were cared for by a hospitalist during your hospital stay. Once you are discharged, your primary care physician will handle any further medical issues. Please note that NO REFILLS for any discharge medications will be authorized once you are discharged, as it is imperative that you return to your primary care physician (or establish a relationship with a primary care physician if you do not have one) for your post hospital discharge needs so that they can reassess your need for medications and monitor your lab values.  Signed: Terrilee Croak  [facility] 12/12/2019, 1:53 PM

## 2019-12-12 NOTE — Progress Notes (Addendum)
Progress Note  Patient Name: Sara [name] Date of Encounter: 12/12/2019  Primary Cardiologist: Dr. Ellyn Hack, MD  Subjective   Feels okay this morning.  Seems to have had some issues with sundowning last night and this morning.  During these episodes, her heart rate does tend to go up.  She has no sensation of this whatsoever.  Is happy that she is moving her right arm a little bit more than she was yesterday.  Left leg is less painful.  Inpatient Medications    Scheduled Meds: . amiodarone  200 mg Oral Daily  . apixaban  5 mg Oral BID  . carvedilol  25 mg Oral BID WC  . ezetimibe  10 mg Oral Daily  . potassium chloride  20 mEq Oral BID  . rOPINIRole  1.5 mg Oral QHS   Continuous Infusions:  PRN Meds: acetaminophen **OR** acetaminophen (TYLENOL) oral liquid 160 mg/5 mL **OR** acetaminophen, hydrOXYzine, melatonin   Vital Signs    Vitals:   12/11/19 2339 12/12/19 0000 12/12/19 0400 12/12/19 0813  BP: (!) 98/51 114/67 130/64 125/87  Pulse: 97 97 99 96  Resp: 18 20  19   Temp: 98.8 F (37.1 C) 97.9 F (36.6 C) 97.8 F (36.6 C)   TempSrc: Axillary  Oral   SpO2: 99% 98% 98% 93%  Weight:      Height:        Intake/Output Summary (Last 24 hours) at 12/12/2019 1151 Last data filed at 12/12/2019 8527 Gross per 24 hour  Intake 250 ml  Output 600 ml  Net -350 ml   Filed Weights   12/05/19 1027  Weight: 68.6 kg   Physical Exam   General: Sleeping when I went in.  Somewhat confused elderly woman who once she wakes up is able to answer pending questions.  No obvious distress. Neck: No JVD or bruit. Lungs: From anterior auscultation, CTA B, nonlabored with good air movement. Cardiovascular: Distant heart sounds with irregularly irregular rhythm.  Normal S1 S2.  Systolic ejection murmur. Abdomen: Soft/NT/ND/NABS.  No HSM. Extremities: No C/C/C; mild neglect of the right arm but is able to move slightly.  1/5 strength with 0.5/5 handgrip Neuro: Alert and oriented. -  >  Right arm significant paralysis/paresis Psych: Normal mood and affect.  Answers questions appropriately once awake  Labs    Chemistry Recent Labs  Lab 12/08/19 1506 12/10/19 0352  NA 142 143  K 4.8 4.1  CL 114* 110  CO2 23 25  GLUCOSE 109* 116*  BUN 13 15  CREATININE 0.66 0.73  CALCIUM 9.3 9.8  GFRNONAA >60 >60  GFRAA >60 >60  ANIONGAP 5 8     Hematology No results for input(s): WBC, RBC, HGB, HCT, MCV, MCH, MCHC, RDW, PLT in the last 168 hours.  Cardiac EnzymesNo results for input(s): TROPONINI in the last 168 hours. No results for input(s): TROPIPOC in the last 168 hours.   BNPNo results for input(s): BNP, PROBNP in the last 168 hours.   DDimer No results for input(s): DDIMER in the last 168 hours.   Radiology    No results found.  Telemetry    AF with rates in the 90-110 with intermittent bursts up to 140 bpm with agitation and movement.  Range  - Personally Reviewed  ECG    No new tracing as of 12/11/19 - Personally Reviewed  Cardiac Studies   ECHO: 12/05/19:  1. Left ventricular ejection fraction, by estimation, is 60 to 65%. The  left  ventricle has normal function. The left ventricle has no regional  wall motion abnormalities. There is mild left ventricular hypertrophy.  Left ventricular diastolic function  could not be evaluated.  2. Right ventricular systolic function is mildly reduced. The right  ventricular size is normal. There is severely elevated pulmonary artery  systolic pressure. The estimated right ventricular systolic pressure is  48.1 mmHg.  3. Left atrial size was moderately dilated.  4. The mitral valve is normal in structure. Mild to moderate mitral valve  regurgitation. No evidence of mitral stenosis.  5. The tricuspid valve is myxomatous. Tricuspid valve regurgitation is  mild to moderate.  6. The aortic valve is normal in structure. Aortic valve regurgitation is  mild. No aortic stenosis is present.  7. The inferior  vena cava is dilated in size with <50% respiratory  variability, suggesting right atrial pressure of 15 mmHg.   Patient Profile     86 y.o. female with a hx of chronic diastolic heart failure exacerbated by Afib,chronicatrial fibrillation (not on anticoagulation due to her advanced age and fall risk), hypertension, hyperlipidemia, PAD, and a remote history of syncope who is being seen today for the evaluation of atrial fibrillation with CVA at the request of Dr. Pietro Cassis.  -Patient presented to [facility] 12/05/2019 with acute onset of right-sided weakness and slurred speech found to have scattered acute infarcts in the left MCA territory with no associated hemorrhage or mass-effect per brain MRI.    Assessment & Plan    1. Chronic atrial fibrillation, now with RVR:  Known longstanding permanent A. fib with difficult to control rate.  She has been on multi medications in the past.  We ended up using carvedilol.  Had been on amiodarone long-term in the past, stopped because of concern for questionable Boop.  Has also not done well with digoxin and diltiazem.  Not a PPM candidate based on age.  Had previously refused to be on [facility] because of falls and patient wishes.  --Now on DOAC following CVA (Eliquis 5 mg p.o. twice daily). -Has known difficulty with rate control with obvious tachybradycardia>>not a candidate for PPM placement   Carvedilol dose was increased to 25 mg twice daily.  Short attempt for acute rate control with amiodarone has been relatively successful, but she had tachycardia during agitation and movements.  For the most part heart rates are better controlled in the 80s to 90s now. ->  We will give 1 more dose of amiodarone today and then stop.  Continue carvedilol.  If blood pressure is adequate, could use as needed metoprolol for tachycardia, however these episodes are so short-lived that only IV would work.  Blood pressures have not been adequate to have standing additional doses.  2.   Chronic diastolic congestive heart failure: -Echocardiogram from 12/05/2019 with LVEF at 60 to 65% with mild LVH, left ventricular diastolic function could not be evaluated secondary to atrial fibrillation, severely elevated PA pressure 65.1 mmHg, moderate LAE, mild to moderate MR, mild to moderate TR, mild AR Remains euvolemic.  Not on diuretic  3.  Acute L MCA CVA:12/05/2019 with acute right-sided weakness and slurred speech found to have scattered acute infarcts in the left MCA territory with no associated hemorrhage or mass-effect per prior MRI  Now on Eliquis 5 mg twice daily.  No longer on aspirin.  3.  Hypertension: Stable. -Stable, 128/74>151/76>135/76 Has had some low spells with carvedilol dose being increased, but seems to be okay.  We will see how she tolerates  the increased dose on discharge.  4.  Hyperlipidemia: -LDL, 126 -Started on atorvastatin but subsequently discontinued.-> has not tolerated in the past.  5. PAD: -Prior peripheral arterial angiography and PTCA/PCI of the SFA 02/2018 -Followed by Dr. Gwenlyn Found -Last repeat lower extremity arterial Doppler studies from 11/22/2018 revealed a right ABI of 0.81 in the left at 1.07 with widely patent stenting. She has not been seen by our service since that time. ->  Is due for follow-up ABI/LE Dopplers to be done this week.  We will reschedule.    Signed, Glenetta Hew, M.D., M.S. Interventional Cardiologist   [place]. [place], [place] [address]  12/12/2019, 11:51 AM    For questions or updates, please contact   Please consult [email or web address] for contact info under Cardiology/STEMI.

## 2019-12-12 NOTE — TOC Transition Note (Signed)
Transition of Care [facility]) - CM/SW Discharge Note   Patient Details  Name: Sara [name] MRN: [id number] Date of Birth: 30-Jul-1925  Transition of [facility]) CM/SW Contact:  Geralynn Ochs, LCSW Phone Number: 12/12/2019, 2:23 PM   Clinical Narrative:   Nurse to call report to [phone], Room 713    Final next level of care: [place] Barriers to Discharge: Barriers Resolved   Patient Goals and CMS Choice Patient states their goals for this hospitalization and ongoing recovery are:: to get rehab and get back home CMS Medicare.gov Compare Post Acute Care list provided to:: Patient Choice offered to / list presented to : Patient  Discharge Placement              Patient chooses bed at: [place], [place] Patient to be transferred to facility by: [place] Name of family member notified: [facility] Patient and family notified of of transfer: 12/12/19  Discharge Plan and Services                                     Social Determinants of Health (SDOH) Interventions     Readmission Risk Interventions No flowsheet data found.

## 2019-12-15 DIAGNOSIS — I639 Cerebral infarction, unspecified: Secondary | ICD-10-CM | POA: Diagnosis not present

## 2019-12-15 DIAGNOSIS — E785 Hyperlipidemia, unspecified: Secondary | ICD-10-CM | POA: Diagnosis not present

## 2019-12-15 DIAGNOSIS — R262 Difficulty in walking, not elsewhere classified: Secondary | ICD-10-CM | POA: Diagnosis not present

## 2019-12-15 DIAGNOSIS — I119 Hypertensive heart disease without heart failure: Secondary | ICD-10-CM | POA: Diagnosis not present

## 2019-12-16 ENCOUNTER — Telehealth: Payer: Self-pay

## 2019-12-16 NOTE — Telephone Encounter (Signed)
Voicemail message, 10:01a:  Rachael Darby from [employer], has questions about patients upcoming appointment with Janett Billow.

## 2019-12-17 NOTE — Telephone Encounter (Signed)
I called and spoke to [employer] at [employer].  Son asking if needing to be here with pt.  I stated that this is first time seeing her Hospital f/u and someone who knows her should be here.  She will let son know (new job for him).

## 2019-12-30 NOTE — Progress Notes (Signed)
Cardiology Office Note   Date:  12/31/2019   ID:  Sara [name], DOB 02/25/1926, MRN [id number]  PCP:  Van Eyk, Jason, MD  Cardiologist: Dr. Harding CC: Post hospital follow up   History of Present Illness: Sara [name] is a 86 y.o. female who presents for posthospitalization follow-up, after admission on 12/05/2019 for atrial fibrillation and CVA.  She was seen by Dr. Harding on 12/10/2019 after code stroke for acute onset right-sided weakness and slurred speech.  CT of the head was found to be unremarkable however MRI was positive for scattered acute infarcts in the left MCA territory with no associated hemorrhage or mass-effect.  Echocardiogram was also completed with an LVEF of 60 to 65% and mild LVH.  Also carotid duplex arterial studies were completed with nonsignificant stenosis bilaterally.  It was noted that the patient was found to be in atrial fibrillation with RVR, with known history of chronic atrial fibrillation.  She was previously not on anticoagulation secondary to advanced age and high fall risk.  Her carvedilol was increased from home dose of 18.75 twice daily to 25 mg p.o. twice daily.  Sara [name] is also followed by Dr. Berry for PAD, with PTCA/PCI of the [facility] on 02/2018, and she was instructed to continue Plavix for at least 3 months.  Dr. Barry did repeat lower extremity Doppler arterial studies on 11/22/2018 which revealed right ABI of 0.81 in the left 1.07 with widely patent stenting.  She also underwent total knee arthroplasty in January 2020 with complicated postoperative course which included bradycardia and associated dizziness.  Digoxin levels were found to be very elevated and therefore digoxin dose was reduced.  Also diltiazem was discontinued and carvedilol was decreased due to the bradycardia and dizziness.  At the time of the consultation carvedilol was discontinued with concern for hypotension and allow for permissive hypertension.  She was recommended to begin  IV amiodarone loading with bolus and then 200 mg p.o. twice daily thereafter.  However, heart rates came down and therefore the dose treatment regimen was not instituted.  She was continued on carvedilol.  She was to began Eliquis 5 mg twice daily.  She was also started on aspirin 325 mg daily per neurology.  It was noted that she was having issues with sundowning during hospitalization.  Dr. Harding noted that her heart rate was much better controlled but remained in the 80s and 90s.  She was given 1 more dose of amiodarone, and was to continue carvedilol which was reinstituted to 25 mg twice daily.  If blood pressure was adequate she continues admitted metoprolol for tachycardia.  Aspirin was discontinued prior to discharge.    She is here today with complaints of feeling as if she cannot breathe at night.  She also states "I feel my heart slowdown", "sometimes I feel it going fast".  She states she wakes up at night finding it hard to breathe which lasts for couple of hours.  Her O2 sats are normal when checked by nurses.  She denies any bleeding, bruising, or hemoptysis.  She denies chest pain or pressure.  She denies any dyspnea on exertion, however she is not very active.  She is working with physical therapy on strengthening, right sided hemiparesis, and ambulation.  She is currently at [facility].  Uncertain date of discharge.  Past Medical History:  Diagnosis Date  . Ankle edema    Mild, which is intermittent, usually mildly dependent, left slightly greater than the right.  .   Anxiety   . Arthritis    In her knees  . Balance problem    Due to weakened knee  . Chronic diastolic heart failure (HCC)    Class I-II -- exacerbated by Afib RVR  . Corneal edema    Severe corneal edema in right eye with multiple folds. Developed after cataract surgery 05/03/09  . Dysrhythmia    a-fib    since aghe 86 yrs old  . GERD (gastroesophageal reflux disease)   . Hematuria    Resolved after coumadin  was stopped  . Hiatal hernia    With GERD  . History of breast cancer   . HOH (hard of hearing)   . Hyperglycemia    Random, without any history of diabetes  . Hyperlipidemia   . Hypertension    Difficult to control. Renal Doppler 06/02/10 showed less that 60% bilateral renal artery narrowing.  . Hypokalemia   . PAD (peripheral artery disease) ([place])   . Permanent atrial fibrillation (HCC)    Rate control.  ASA.  NOT on DOAC or warfarin due to concern for falls &bleeding.  Marland Kitchen Posthemorrhagic anemia   . Pseudophakia   . Syncope 02/22/11   During OV on 02/22/11 her INR was 7.3 and was given 2.5 mg of vitamin K. Later that day she became diaphoretic & fainted.  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 ABDOMINAL HYSTERECTOMY    . BREAST BIOPSY     3 biopsies  . CARDIOVERSION N/A 03/31/2016   Procedure: CARDIOVERSION;  Surgeon: Jerline Pain, MD;  Location: [facility] ENDOSCOPY;  Service: Cardiovascular: Successful cardioversion from A. fib to sinus rhythm  . CATARACT EXTRACTION  05/03/09  . CHOLECYSTECTOMY    . EYE SURGERY     cataract  . HEMORRHOID SURGERY    . LOWER EXTREMITY ANGIOGRAPHY Right 03/18/2018   Procedure: LOWER EXTREMITY ANGIOGRAPHY;  Surgeon: Lorretta Harp, MD;  Location: [place] [facility];  Service: Cardiovascular;  Laterality: Right;  . PERIPHERAL VASCULAR INTERVENTION  03/18/2018   Procedure: PERIPHERAL VASCULAR INTERVENTION;  Surgeon: Lorretta Harp, MD;  Location: [place] [facility];  Service: Cardiovascular;;  left SFA  . Remote hysterectomy    . TEE WITHOUT CARDIOVERSION N/A 03/31/2016   Procedure: TRANSESOPHAGEAL ECHOCARDIOGRAM (TEE);  Surgeon: Jerline Pain, MD;  Location: [place];  Service: Cardiovascular:  EF 55-60%. No vegetation or thrombus okay for DC CV  . TONSILLECTOMY    . TOTAL KNEE ARTHROPLASTY Right 08/26/10   By Dr. Elta Guadeloupe C. Yates. Cemented, computer assist  . TOTAL KNEE ARTHROPLASTY Left  07/08/2018   Procedure: LEFT TOTAL KNEE ARTHROPLASTY CEMENTED;  Surgeon: Marybelle Killings, MD;  Location: [place];  Service: Orthopedics;  Laterality: Left;  . TRANSTHORACIC ECHOCARDIOGRAM  03/2016    Mild LVH. EF 65-70%. High LVEDP no RWMA. Severe LA dilation. Peak PAP ~ 37 mmHg     Current Outpatient Medications  Medication Sig Dispense Refill  . acetaminophen (TYLENOL) 325 MG tablet Take 650 mg by mouth every 6 (six) hours as needed.    . ALPRAZolam (XANAX) 0.25 MG tablet Take 0.25 mg by mouth every 4 (four) hours as needed for anxiety.    Marland Kitchen apixaban (ELIQUIS) 5 MG TABS tablet Take 1 tablet (5 mg total) by mouth 2 (two) times daily. 60 tablet   . carvedilol (COREG) 25 MG tablet Take 1 tablet (25 mg total) by mouth 2 (two) times  daily with a meal.    . ezetimibe (ZETIA) 10 MG tablet Take 1 tablet (10 mg total) by mouth daily.    . melatonin 5 MG TABS Take 1 tablet (5 mg total) by mouth at bedtime as needed (sleep).  0  . potassium chloride (KLOR-CON) 20 MEQ packet Take 20 mEq by mouth daily.    . rOPINIRole (REQUIP) 1 MG tablet Take 1 tablet by mouth at bedtime.   3   No current facility-administered medications for this visit.    Allergies:   Amiodarone, Aliskiren, Amlodipine besylate-valsartan, Bystolic [nebivolol hcl], Clonidine derivatives, Dronedarone, Hctz [hydrochlorothiazide], Hydralazine, Lisinopril, Nebivolol, Olmesartan, Oxycodone, Rythmol [propafenone], Statins, Carvedilol, and Prednisone    Social History:  The patient  reports that she has never smoked. She has never used smokeless tobacco. She reports that she does not drink alcohol and does not use drugs.   Family History:  The patient's family history includes Cancer in her mother; Coronary artery disease in her brother; Diabetes type II in her brother; Heart attack in her sister; Hypertension in her brother, mother, and sister.    ROS: All other systems are reviewed and negative. Unless otherwise mentioned in H&P     PHYSICAL EXAM: VS:  BP (!) 148/91   Pulse 89   Ht 5' (1.524 m)   Wt 150 lb (68 kg)   SpO2 97%   BMI 29.29 kg/m  , BMI Body mass index is 29.29 kg/m. GEN: Well nourished, well developed, in no acute distress HEENT: normal Neck: no JVD, carotid bruits, or masses Cardiac: IRRR; no murmurs, rubs, or gallops,no edema  Respiratory:  Clear to auscultation bilaterally, normal work of breathing GI: soft, nontender, nondistended, + BS MS: no deformity or atrophy, kyphosis noted Skin: warm and dry, no rash Neuro:  Strength and sensation are intact, right hemiparesis Psych: euthymic mood, full affect   EKG: Not completed this office visit  Recent Labs: 12/05/2019: ALT 15; Hemoglobin 14.9; Platelets 211; TSH 1.769 12/08/2019: Magnesium 2.2 12/10/2019: BUN 15; Creatinine, Ser 0.73; Potassium 4.1; Sodium 143    Lipid Panel    Component Value Date/Time   CHOL 182 12/05/2019 0751   TRIG 58 12/05/2019 0751   HDL 44 12/05/2019 0751   CHOLHDL 4.1 12/05/2019 0751   VLDL 12 12/05/2019 0751   LDLCALC 126 (H) 12/05/2019 0751      Wt Readings from Last 3 Encounters:  12/31/19 150 lb (68 kg)  12/05/19 151 lb 3.8 oz (68.6 kg)  12/04/18 144 lb (65.3 kg)      Other studies Reviewed: Echocardiogram 12/05/2019 1. Left ventricular ejection fraction, by estimation, is 60 to 65%. The  left ventricle has normal function. The left ventricle has no regional  wall motion abnormalities. There is mild left ventricular hypertrophy.  Left ventricular diastolic function  could not be evaluated.  2. Right ventricular systolic function is mildly reduced. The right  ventricular size is normal. There is severely elevated pulmonary artery  systolic pressure. The estimated right ventricular systolic pressure is  65.1 mmHg.  3. Left atrial size was moderately dilated.  4. The mitral valve is normal in structure. Mild to moderate mitral valve  regurgitation. No evidence of mitral stenosis.  5. The  tricuspid valve is myxomatous. Tricuspid valve regurgitation is  mild to moderate.  6. The aortic valve is normal in structure. Aortic valve regurgitation is  mild. No aortic stenosis is present.  7. The inferior vena cava is dilated in size with <50% respiratory    variability, suggesting right atrial pressure of 15 mmHg.    ASSESSMENT AND PLAN:  1.  Permanent atrial fibrillation: Continue carvedilol 25 mg twice daily, can possibly have Toprol 25 mg added as needed.  Uncertain if she is feeling anxiety versus actual rapid heart rhythm.  Can consider outpatient cardiac monitor when she is released from skilled nursing facility if symptoms persist.  She will continue apixaban 5 mg twice daily.  I will check a CBC and a be met today.  2.  PAD: She was due to have follow-up ABIs and Doppler ultrasound.  This was not completed as she was hospitalized when procedure was scheduled.  We will reschedule this so that she can follow-up with Dr. Berry as directed.  3.  Hyperlipidemia: Continue Zetia 10 mg daily.  Goal of LDL less than 70.  I do not see that she is on statin therapy at this time.  This will need to be reevaluated by PCP or on follow-up to check her lipid status.  She had previously been on atorvastatin but was discontinued as she did not tolerate it in the past.  May need to consider another option on follow-up.  4.  History of MCA infarct.  Right-sided hemiparesis is noted.  She continues on Eliquis twice daily.  Aspirin has been discontinued.  5.  Hypertension: Blood pressure slightly elevated today.  She is quite anxious.  We will follow this.  May need to add additional medication if necessary.  During hospitalization blood pressure was very well controlled with recorded readings of 128/74-130 5/76.  No changes at this time.   Current medicines are reviewed at length with the patient today.  I have spent 35 minutes dedicated to the care of this patient on the date of this encounter to  include pre-visit review of records, assessment, management and diagnostic testing,with shared decision making.  Labs/ tests ordered today include: BMET, CBC.    Kathryn M. Lawrence DNP, ANP, AACC   12/31/2019 12:50 PM    [facility] [address] Office [phone] Fax [phone]  Notice: This dictation was prepared with Dragon dictation along with smaller phrase technology. Any transcriptional errors that result from this process are unintentional and may not be corrected upon review. 

## 2019-12-31 ENCOUNTER — Ambulatory Visit (INDEPENDENT_AMBULATORY_CARE_PROVIDER_SITE_OTHER): Payer: Medicare HMO | Admitting: Adult Health

## 2019-12-31 ENCOUNTER — Telehealth: Payer: Self-pay | Admitting: Cardiology

## 2019-12-31 ENCOUNTER — Other Ambulatory Visit: Payer: Self-pay

## 2019-12-31 ENCOUNTER — Encounter: Payer: Self-pay | Admitting: Adult Health

## 2019-12-31 VITALS — BP 148/91 | HR 89 | Ht 60.0 in | Wt 150.0 lb

## 2019-12-31 DIAGNOSIS — I6339 Cerebral infarction due to thrombosis of other cerebral artery: Secondary | ICD-10-CM

## 2019-12-31 DIAGNOSIS — E7800 Pure hypercholesterolemia, unspecified: Secondary | ICD-10-CM | POA: Diagnosis not present

## 2019-12-31 DIAGNOSIS — Z79899 Other long term (current) drug therapy: Secondary | ICD-10-CM

## 2019-12-31 DIAGNOSIS — I10 Essential (primary) hypertension: Secondary | ICD-10-CM | POA: Diagnosis not present

## 2019-12-31 DIAGNOSIS — F411 Generalized anxiety disorder: Secondary | ICD-10-CM

## 2019-12-31 DIAGNOSIS — I480 Paroxysmal atrial fibrillation: Secondary | ICD-10-CM

## 2019-12-31 DIAGNOSIS — I739 Peripheral vascular disease, unspecified: Secondary | ICD-10-CM

## 2019-12-31 NOTE — Telephone Encounter (Signed)
Spoke with Janett Billow at [facility] and she is going to have the pts Peripheral study ordered by Arnold Long NP today, to be done at their facility and send [place] the report and possibly the films if Dr. Gwenlyn Found would like to review himself.

## 2019-12-31 NOTE — Patient Instructions (Addendum)
Medication Instructions:  Continue current medications  *If you need a refill on your cardiac medications before your next appointment, please call your pharmacy*   Lab Work: CBC and BMP Today   Testing/Procedures: Your physician has requested that you have a lower extremity arterial duplex. This test is an ultrasound of the arteries in the legs or arms. It looks at arterial blood flow in the legs and arms. Allow one hour for Lower and Upper Arterial scans. There are no restrictions or special instructions  Your physician has requested that you have an ankle brachial index (ABI). During this test an ultrasound and blood pressure cuff are used to evaluate the arteries that supply the arms and legs with blood. Allow thirty minutes for this exam. There are no restrictions or special instructions.   Follow-Up: At [facility], you and your health needs are our priority.  As part of our continuing mission to provide you with exceptional heart care, we have created designated Provider Care Teams.  These Care Teams include your primary Cardiologist (physician) and Advanced Practice Providers (APPs -  Physician Assistants and Nurse Practitioners) who all work together to provide you with the care you need, when you need it.  We recommend signing up for the patient portal called "MyChart".  Sign up information is provided on this After Visit Summary.  MyChart is used to connect with patients for Virtual Visits (Telemedicine).  Patients are able to view lab/test results, encounter notes, upcoming appointments, etc.  Non-urgent messages can be sent to your provider as well.   To learn more about what you can do with MyChart, go to [email or web address].    Your next appointment:   6 week(s)  The format for your next appointment:   In Person  Provider:   Glenetta Hew, MD

## 2019-12-31 NOTE — Telephone Encounter (Signed)
Janett Billow with [facility] is requesting clarification on AVS instructions from appointment completed on today, 12/31/19. In addition, she states VAS [place] ABI WITH/WO TBI may be completed at the nursing home because patient has orders for testing. Please returning call to discuss at [phone] (ext#: [phone]).

## 2020-01-01 LAB — BASIC METABOLIC PANEL
BUN/Creatinine Ratio: 16 (ref 12–28)
BUN: 10 mg/dL (ref 10–36)
CO2: 25 mmol/L (ref 20–29)
Calcium: 9.8 mg/dL (ref 8.7–10.3)
Chloride: 109 mmol/L — ABNORMAL HIGH (ref 96–106)
Creatinine, Ser: 0.63 mg/dL (ref 0.57–1.00)
GFR calc Af Amer: 91 mL/min/{1.73_m2} (ref >59)
GFR calc non Af Amer: 79 mL/min/{1.73_m2} (ref >59)
Glucose: 89 mg/dL (ref 65–99)
Potassium: 4.9 mmol/L (ref 3.5–5.2)
Sodium: 145 mmol/L — ABNORMAL HIGH (ref 134–144)

## 2020-01-01 LAB — CBC
Hematocrit: 43.8 % (ref 34.0–46.6)
Hemoglobin: 14 g/dL (ref 11.1–15.9)
MCH: 29.5 pg (ref 26.6–33.0)
MCHC: 32 g/dL (ref 31.5–35.7)
MCV: 92 fL (ref 79–97)
Platelets: 200 10*3/uL (ref 150–450)
RBC: 4.75 x10E6/uL (ref 3.77–5.28)
RDW: 14.3 % (ref 11.7–15.4)
WBC: 5.4 10*3/uL (ref 3.4–10.8)

## 2020-01-08 ENCOUNTER — Other Ambulatory Visit: Payer: Self-pay

## 2020-01-08 ENCOUNTER — Encounter: Payer: Self-pay | Admitting: Adult Health

## 2020-01-08 ENCOUNTER — Encounter: Payer: Self-pay | Admitting: *Deleted

## 2020-01-08 ENCOUNTER — Ambulatory Visit: Payer: Medicare HMO | Admitting: Adult Health

## 2020-01-08 VITALS — BP 135/80 | HR 85

## 2020-01-08 DIAGNOSIS — E785 Hyperlipidemia, unspecified: Secondary | ICD-10-CM | POA: Diagnosis not present

## 2020-01-08 DIAGNOSIS — I4811 Longstanding persistent atrial fibrillation: Secondary | ICD-10-CM

## 2020-01-08 DIAGNOSIS — I10 Essential (primary) hypertension: Secondary | ICD-10-CM | POA: Diagnosis not present

## 2020-01-08 DIAGNOSIS — I63413 Cerebral infarction due to embolism of bilateral middle cerebral arteries: Secondary | ICD-10-CM | POA: Diagnosis not present

## 2020-01-08 NOTE — Progress Notes (Signed)
[facility] [address] [place]. [place] [address] [phone]       HOSPITAL FOLLOW UP NOTE  [name] Date of Birth:  1925/10/17 Medical Record Number:  [id number]   Reason for Referral:  hospital stroke follow up    SUBJECTIVE:   CHIEF COMPLAINT:  Chief Complaint  Patient presents with  . Follow-up    tx rm here for a stroke. Pt is here with her son [place].    HPI:   [name] is a 86 y.o. female with history of chronic diastolic CHF, A-fib (not anticoagulated), history of breast CA, HLD, HTN and PAD  who presented on 12/05/2019 with right sided weakness and slurred speech.   Stroke work-up revealed left MCA and ACA territory scattered infarcts secondary to carotid stenosis vs atrial fibrillation not on AC.  2 days after presentation, transient worsening of right-sided deficit and slurred speech with repeat CT head showing evolving nonhemorrhagic infarcts.  Initially recommended initiating Eliquis 3 days post stroke but recommended initiation at that time and to continue at discharge.  Discontinued aspirin and Plavix which were prescribed PTA.  Previously on warfarin discontinued due to GIB "many years ago". History of HTN with long-term BP goal 130-150 given severe left ICA siphon stenosis.  LDL 126 and initiated Zetia due to statin intolerance.  Other stroke risk factors include hx of GIB, advanced age and CHF without prior stroke history.  Residual deficits of right hemiparesis and cognitive impairment.  Evaluated by therapies who recommended discharge to SNF for ongoing therapy needs.  Stroke: L MCA and ACA territory scattered infarcts - embolic - carotid stenosis vs atrial fibrillation not on AC.   CT Head - No acute intracranial infarct or other abnormality. ASPECTS is 10.   MRI head - scattered acute infarcts in the Left MCA territory, primarily cortical. No associated hemorrhage or mass effect. Subtle involvement of the left  MCA/ACA watershed.    MRA head - Negative for large vessel occlusion, but positive for extensive intracranial atherosclerosis: High-grade distal Left ICA stenosis in the supraclinoid segment. Severely stenotic non dominant distal Right Vertebral Artery, with up to moderate stenosis at the junction of the dominant Left Vertebrobasilar junction.   Carotid Doppler unremarkable  2D Echo EF 60-65%  [employer] Virus 2 - negative  LDL - 126 -initiated Zetia  HgbA1c -5.7  VTE prophylaxis - Lovenox  aspirin 81 mg daily and clopidogrel 75 mg daily prior to admission, now on ASA 325. Recommend eliquis 3 days post stroke for stroke prevention. ASA can be discontinued then.   Patient counseled to be compliant with her antithrombotic medications  Ongoing aggressive stroke risk factor management  Therapy recommendations: SNF  Disposition:  [facility]  Today, 01/08/2020, [name] is being seen for hospital follow-up.  Currently residing at [facility] and continues to receive therapy.  Residual deficits of right hemiparesis which has been gradually improving.  Currently nonambulatory and transfers via wheelchair and hoyer lift. Per son, able to stand for short duration during therapy.  Appears to be in good spirits and is hopeful to return home in the future.  Continues on Eliquis 5 mg twice daily without bleeding or bruising.  Continues on Zetia without reported side effects for HLD management.  History of statin intolerance with atorvastatin.  Blood pressure today stable at 135/80.  No further concerns at this time.      ROS:   14 system review of systems performed and negative  with exception of weakness, gait impairment  PMH:  Past Medical History:  Diagnosis Date  . Ankle edema    Mild, which is intermittent, usually mildly dependent, left slightly greater than the right.  . Anxiety   . Arthritis    In her knees  . Balance problem    Due to weakened knee  . Chronic diastolic heart  failure (HCC)    Class I-II -- exacerbated by Afib RVR  . Corneal edema    Severe corneal edema in right eye with multiple folds. Developed after cataract surgery 05/03/09  . Dysrhythmia    a-fib    since aghe 86 yrs old  . GERD (gastroesophageal reflux disease)   . Hematuria    Resolved after coumadin was stopped  . Hiatal hernia    With GERD  . History of breast cancer   . HOH (hard of hearing)   . Hyperglycemia    Random, without any history of diabetes  . Hyperlipidemia   . Hypertension    Difficult to control. Renal Doppler 06/02/10 showed less that 60% bilateral renal artery narrowing.  . Hypokalemia   . PAD (peripheral artery disease) ([place])   . Permanent atrial fibrillation (HCC)    Rate control.  ASA.  NOT on DOAC or warfarin due to concern for falls &bleeding.  Marland Kitchen Posthemorrhagic anemia   . Pseudophakia   . Syncope 02/22/11   During OV on 02/22/11 her INR was 7.3 and was given 2.5 mg of vitamin K. Later that day she became diaphoretic & fainted.  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 ABDOMINAL HYSTERECTOMY    . BREAST BIOPSY     3 biopsies  . CARDIOVERSION N/A 03/31/2016   Procedure: CARDIOVERSION;  Surgeon: Jerline Pain, MD;  Location: [facility] ENDOSCOPY;  Service: Cardiovascular: Successful cardioversion from A. fib to sinus rhythm  . CATARACT EXTRACTION  05/03/09  . CHOLECYSTECTOMY    . EYE SURGERY     cataract  . HEMORRHOID SURGERY    . LOWER EXTREMITY ANGIOGRAPHY Right 03/18/2018   Procedure: LOWER EXTREMITY ANGIOGRAPHY;  Surgeon: Lorretta Harp, MD;  Location: [facility];  Service: Cardiovascular;  Laterality: Right;  . PERIPHERAL VASCULAR INTERVENTION  03/18/2018   Procedure: PERIPHERAL VASCULAR INTERVENTION;  Surgeon: Lorretta Harp, MD;  Location: [facility];  Service: Cardiovascular;;  left SFA  . Remote hysterectomy    . TEE WITHOUT CARDIOVERSION N/A 03/31/2016    Procedure: TRANSESOPHAGEAL ECHOCARDIOGRAM (TEE);  Surgeon: Jerline Pain, MD;  Location: [facility];  Service: Cardiovascular:  EF 55-60%. No vegetation or thrombus okay for DC CV  . TONSILLECTOMY    . TOTAL KNEE ARTHROPLASTY Right 08/26/10   By Dr. Elta Guadeloupe C. Yates. Cemented, computer assist  . TOTAL KNEE ARTHROPLASTY Left 07/08/2018   Procedure: LEFT TOTAL KNEE ARTHROPLASTY CEMENTED;  Surgeon: Marybelle Killings, MD;  Location: [facility];  Service: Orthopedics;  Laterality: Left;  . TRANSTHORACIC ECHOCARDIOGRAM  03/2016    Mild LVH. EF 65-70%. High LVEDP no RWMA. Severe LA dilation. Peak PAP ~ 37 mmHg    Social History:  Social History   Socioeconomic History  . Marital status: Widowed    Spouse name: Not on file  . Number of children: 1  . Years of education: Not on file  . Highest education level: Not on file  Occupational History    Employer: RETIRED  Tobacco Use  . Smoking status: Never Smoker  . Smokeless tobacco: Never Used  Vaping Use  . Vaping Use: Never used  Substance and Sexual Activity  . Alcohol use: No  . Drug use: No  . Sexual activity: Not on file  Other Topics Concern  . Not on file  Social History Narrative   She is a widowed mother of one and grandmother of one.  She does not exercise.  She does not smoke and does not drink alcohol.   Social Determinants of Health   Financial Resource Strain:   . Difficulty of Paying Living Expenses:   Food Insecurity:   . Worried About Charity fundraiser in the Last Year:   . Arboriculturist in the Last Year:   Transportation Needs:   . Film/video editor (Medical):   Marland Kitchen Lack of Transportation (Non-Medical):   Physical Activity:   . Days of Exercise per Week:   . Minutes of Exercise per Session:   Stress:   . Feeling of Stress :   Social Connections:   . Frequency of Communication with Friends and Family:   . Frequency of Social Gatherings with Friends and Family:   . Attends Religious Services:   . Active Member of  Clubs or Organizations:   . Attends Archivist Meetings:   Marland Kitchen Marital Status:   Intimate Partner Violence:   . Fear of Current or Ex-Partner:   . Emotionally Abused:   Marland Kitchen Physically Abused:   . Sexually Abused:     Family History:  Family History  Problem Relation Age of Onset  . Cancer Mother        Mouth  . Hypertension Mother   . Coronary artery disease Brother   . Hypertension Brother   . Diabetes type II Brother   . Heart attack Sister   . Hypertension Sister     Medications:   Current Outpatient Medications on File Prior to Visit  Medication Sig Dispense Refill  . acetaminophen (TYLENOL) 325 MG tablet Take 650 mg by mouth every 6 (six) hours as needed.    Marland Kitchen apixaban (ELIQUIS) 5 MG TABS tablet Take 1 tablet (5 mg total) by mouth 2 (two) times daily. 60 tablet   . carvedilol (COREG) 25 MG tablet Take 1 tablet (25 mg total) by mouth 2 (two) times daily with a meal.    . ezetimibe (ZETIA) 10 MG tablet Take 1 tablet (10 mg total) by mouth daily.    . melatonin 5 MG TABS Take 1 tablet (5 mg total) by mouth at bedtime as needed (sleep).  0  . potassium chloride (KLOR-CON) 20 MEQ packet Take 20 mEq by mouth daily.    Marland Kitchen rOPINIRole (REQUIP) 1 MG tablet Take 1 tablet by mouth at bedtime.   3   No current facility-administered medications on file prior to visit.    Allergies:   Allergies  Allergen Reactions  . Amiodarone Shortness Of Breath and Other (See Comments)    Stopped in Feb 2017 secondary to pulmonary complications  . Aliskiren Other (See Comments) and Nausea Only    unknown  . Amlodipine Besylate-Valsartan Other (See Comments) and Nausea Only    Unclear  . Bystolic [Nebivolol Hcl] Other (See Comments)    unknown  . Clonidine Derivatives Other (See Comments)    unknown  . Dronedarone Nausea And Vomiting and Nausea Only  . Hctz [Hydrochlorothiazide] Other (See Comments)    Currently taking without problem; question if  this has to do with hypokalemia  .  Hydralazine Other (See Comments)    Also tolerated, but had orthostatic changes on standing medication  . Lisinopril Other (See Comments) and Nausea Only    unknown  . Nebivolol Nausea Only  . Olmesartan Nausea Only and Other (See Comments)    Unclear of the intolerance  . Oxycodone     Patient states decreased heart rate, decreased blood pressure. List as allergy.  . Rythmol [Propafenone] Other (See Comments)    unknown  . Statins Other (See Comments) and Nausea Only    unknown  . Carvedilol Other (See Comments) and Nausea And Vomiting    fatigue  . Prednisone Nausea And Vomiting and Other (See Comments)    Jittery       OBJECTIVE:  Physical Exam  Vitals:   01/08/20 1002  BP: 135/80  Pulse: 85   There is no height or weight on file to calculate BMI. No exam data present  General: well developed, well nourished,  very pleasant elderly Caucasian female, seated, in no evident distress Head: head normocephalic and atraumatic.   Neck: supple with no carotid or supraclavicular bruits Cardiovascular: Irregular rate and rhythm, no murmurs Musculoskeletal: no deformity Skin:  no rash/petichiae Vascular:  Normal pulses all extremities   Neurologic Exam Mental Status: Awake and fully alert.   Fluent speech and language.  Oriented to place and time. Recent memory diminished and remote memory intact. Attention span, concentration and fund of knowledge appropriate during visit. Mood and affect appropriate.  Cranial Nerves: Fundoscopic exam reveals sharp disc margins. Pupils equal, briskly reactive to light. Extraocular movements full without nystagmus. Visual fields full to confrontation. Hearing intact. Facial sensation intact.  Mild right lower facial weakness, tongue and palate moves normally and symmetrically.  Motor:  RUE: 3/5 deltoid, 4/5 bicep and tricep, mild hand weakness with increased tone RLE: 2/5 with foot drop with increased tone LUE: 5/5 LLE: 4/5 hip flexor; 5/5  distally Sensory.: intact to touch , pinprick , position and vibratory sensation.  Coordination: Rapid alternating movements normal in all extremities except decreased right hand. Finger-to-nose performed accurately LUE and heel-to-shin difficulty performing due to weakness Gait and Station: Deferred as patient nonambulatory Reflexes: 1+ and symmetric. Toes downgoing.     NIHSS  5 Modified Rankin  3-4 CHA2DS2-VASc 8 HAS-BLED 3     ASSESSMENT/PLAN: Sara [name] is a 86 y.o. year old female presented with right-sided weakness and slurred speech on 12/05/2019 with stroke work-up revealing left MCA and ACA territory scattered infarcts secondary to carotid stenosis vs atrial fibrillation not on AC with acute worsening on 12/07/2019 with evolving nonhemorrhagic infarcts.  Eliquis initiated as well as recommending avoidance of hypotension.  Vascular risk factors include chronic A. Fib, PAD, chronic diastolic CHF, hx of GIB, HTN, HLD and intracranial arthrosclerosis.      1. Left MCA and ACA strokes:  -Residual deficits: Right hemiparesis and right facial droop.  Encouraged continued participation with therapy for hopeful further improvement.   -Continue Eliquis (apixaban) daily  and Zetia for secondary stroke prevention.   -Ensure close PCP follow-up for aggressive stroke risk factor management 2. HTN: BP goal 130-150.  Stable today.  Continue to be managed by facility 3. HLD: LDL goal<70.  Recent A1c 126.  History of statin intolerance.  Continue Zetia 4. Chronic atrial fibrillation: Continue Eliquis and ongoing follow-up with cardiology for monitoring management    Follow up in 3 months or call earlier if needed  I spent 45 minutes of face-to-face and non-face-to-face time with patient and son.  This included previsit chart review, lab review, study review, order entry, electronic health record documentation, patient education regarding recent stroke, residual deficits, importance of  managing stroke risk factors and answered all questions to patient satisfaction     Frann Rider, [facility]  [facility] [address] [place], [place] [address]  Phone [phone] Fax [phone] Note: This document was prepared with digital dictation and possible smart phrase technology. Any transcriptional errors that result from this process are unintentional.

## 2020-01-08 NOTE — Patient Instructions (Signed)
Residual right spastic hemiparesis and right facial droop -please ensure aggressive PT and OT for hopeful further recovery with goal of eventually returning home  Continue Eliquis (apixaban) daily  and Zetia for secondary stroke prevention  Continue to follow up with PCP regarding cholesterol and blood pressure management   Maintain strict control of hypertension with blood pressure goal below 130/90 and cholesterol with LDL cholesterol (bad cholesterol) goal below 70 mg/dL. I also advised the patient to eat a healthy diet with plenty of whole grains, cereals, fruits and vegetables, exercise regularly and maintain ideal body weight.  Followup in the future with me in 3 months or call earlier if needed       Thank you for coming to see [place] at [facility]. I hope we have been able to provide you high quality care today.  You may receive a patient satisfaction survey over the next few weeks. We would appreciate your feedback and comments so that we may continue to improve ourselves and the health of our patients.

## 2020-01-15 DIAGNOSIS — G4700 Insomnia, unspecified: Secondary | ICD-10-CM | POA: Diagnosis not present

## 2020-01-15 DIAGNOSIS — F419 Anxiety disorder, unspecified: Secondary | ICD-10-CM | POA: Diagnosis not present

## 2020-01-17 DIAGNOSIS — I739 Peripheral vascular disease, unspecified: Secondary | ICD-10-CM | POA: Diagnosis not present

## 2020-01-17 DIAGNOSIS — M810 Age-related osteoporosis without current pathological fracture: Secondary | ICD-10-CM | POA: Diagnosis not present

## 2020-01-17 DIAGNOSIS — R5381 Other malaise: Secondary | ICD-10-CM | POA: Diagnosis not present

## 2020-01-17 DIAGNOSIS — I5032 Chronic diastolic (congestive) heart failure: Secondary | ICD-10-CM | POA: Diagnosis not present

## 2020-01-18 DIAGNOSIS — I4821 Permanent atrial fibrillation: Secondary | ICD-10-CM | POA: Diagnosis not present

## 2020-01-18 DIAGNOSIS — R279 Unspecified lack of coordination: Secondary | ICD-10-CM | POA: Diagnosis not present

## 2020-01-18 DIAGNOSIS — I639 Cerebral infarction, unspecified: Secondary | ICD-10-CM | POA: Diagnosis not present

## 2020-01-18 DIAGNOSIS — E785 Hyperlipidemia, unspecified: Secondary | ICD-10-CM | POA: Diagnosis not present

## 2020-01-18 DIAGNOSIS — R41841 Cognitive communication deficit: Secondary | ICD-10-CM | POA: Diagnosis not present

## 2020-01-18 DIAGNOSIS — I739 Peripheral vascular disease, unspecified: Secondary | ICD-10-CM | POA: Diagnosis not present

## 2020-01-18 DIAGNOSIS — K219 Gastro-esophageal reflux disease without esophagitis: Secondary | ICD-10-CM | POA: Diagnosis not present

## 2020-01-18 DIAGNOSIS — M6281 Muscle weakness (generalized): Secondary | ICD-10-CM | POA: Diagnosis not present

## 2020-01-18 DIAGNOSIS — I4891 Unspecified atrial fibrillation: Secondary | ICD-10-CM | POA: Diagnosis not present

## 2020-01-18 DIAGNOSIS — I69322 Dysarthria following cerebral infarction: Secondary | ICD-10-CM | POA: Diagnosis not present

## 2020-01-18 DIAGNOSIS — R2689 Other abnormalities of gait and mobility: Secondary | ICD-10-CM | POA: Diagnosis not present

## 2020-01-18 DIAGNOSIS — G4700 Insomnia, unspecified: Secondary | ICD-10-CM | POA: Diagnosis not present

## 2020-01-18 DIAGNOSIS — I69351 Hemiplegia and hemiparesis following cerebral infarction affecting right dominant side: Secondary | ICD-10-CM | POA: Diagnosis not present

## 2020-01-18 DIAGNOSIS — R278 Other lack of coordination: Secondary | ICD-10-CM | POA: Diagnosis not present

## 2020-01-18 DIAGNOSIS — I10 Essential (primary) hypertension: Secondary | ICD-10-CM | POA: Diagnosis not present

## 2020-01-18 DIAGNOSIS — Z8673 Personal history of transient ischemic attack (TIA), and cerebral infarction without residual deficits: Secondary | ICD-10-CM | POA: Diagnosis not present

## 2020-01-18 DIAGNOSIS — I5032 Chronic diastolic (congestive) heart failure: Secondary | ICD-10-CM | POA: Diagnosis not present

## 2020-01-18 DIAGNOSIS — I5030 Unspecified diastolic (congestive) heart failure: Secondary | ICD-10-CM | POA: Diagnosis not present

## 2020-01-18 DIAGNOSIS — M25561 Pain in right knee: Secondary | ICD-10-CM | POA: Diagnosis not present

## 2020-01-18 DIAGNOSIS — I480 Paroxysmal atrial fibrillation: Secondary | ICD-10-CM | POA: Diagnosis not present

## 2020-01-19 ENCOUNTER — Ambulatory Visit: Payer: Medicare HMO | Admitting: Cardiology

## 2020-02-12 ENCOUNTER — Telehealth: Payer: Self-pay | Admitting: Cardiology

## 2020-02-12 ENCOUNTER — Encounter: Payer: Self-pay | Admitting: Cardiology

## 2020-02-12 ENCOUNTER — Other Ambulatory Visit: Payer: Self-pay

## 2020-02-12 ENCOUNTER — Ambulatory Visit (INDEPENDENT_AMBULATORY_CARE_PROVIDER_SITE_OTHER): Payer: Medicare HMO | Admitting: Cardiology

## 2020-02-12 VITALS — BP 128/72 | HR 109 | Ht 60.0 in | Wt 149.0 lb

## 2020-02-12 DIAGNOSIS — I480 Paroxysmal atrial fibrillation: Secondary | ICD-10-CM

## 2020-02-12 DIAGNOSIS — I739 Peripheral vascular disease, unspecified: Secondary | ICD-10-CM

## 2020-02-12 DIAGNOSIS — I4821 Permanent atrial fibrillation: Secondary | ICD-10-CM

## 2020-02-12 DIAGNOSIS — I10 Essential (primary) hypertension: Secondary | ICD-10-CM | POA: Diagnosis not present

## 2020-02-12 DIAGNOSIS — Z8673 Personal history of transient ischemic attack (TIA), and cerebral infarction without residual deficits: Secondary | ICD-10-CM

## 2020-02-12 DIAGNOSIS — I5032 Chronic diastolic (congestive) heart failure: Secondary | ICD-10-CM

## 2020-02-12 MED ORDER — DILTIAZEM HCL 60 MG PO TABS: ORAL_TABLET | ORAL | 11 refills | Status: AC

## 2020-02-12 NOTE — Telephone Encounter (Signed)
    Pt c/o medication issue:  1. Name of Medication:   diltiazem (CARDIZEM) 60 MG tablet    2. How are you currently taking this medication (dosage and times per day)?   3. Are you having a reaction (difficulty breathing--STAT)?   4. What is your medication issue? RN from nursing home called when she putting pt's medications she put   diltiazem (CARDIZEM) 60 MG tablet  And it says pt is allergic to norvasc. She wanted to let Dr. Ellyn Hack know make sure he is aware. She said if there's any question call them and can speak with any nurse. (she did not provide her name when ask)

## 2020-02-12 NOTE — Patient Instructions (Addendum)
Medication Instructions:   DILTIAZEM 60 MG --  AS NEEDED TAKE 60 MG BY MOUTH IF HEART RATE IS EQUAL OR ABOVE 120 FORAT LEAST 30 3OMINS MAY REPEAT EVERY 8 HOURS IF NEEDED   NO OTHER CHANGES  *If you need a refill on yo ur cardiac medications before your next appointment, please call your pharmacy*   Lab Work: NOT NEEDED   Testing/Procedures: NOT NEEDED   Follow-Up: At [facility], you and your health needs are our priority.  As part of our continuing mission to provide you with exceptional heart care, we have created designated Provider Care Teams.  These Care Teams include your primary Cardiologist (physician) and Advanced Practice Providers (APPs -  Physician Assistants and Nurse Practitioners) who all work together to provide you with the care you need, when you need it.  We recommend signing up for the patient portal called "MyChart".  Sign up information is provided on this After Visit Summary.  MyChart is used to connect with patients for Virtual Visits (Telemedicine).  Patients are able to view lab/test results, encounter notes, upcoming appointments, etc.  Non-urgent messages can be sent to your provider as well.   To learn more about what you can do with MyChart, go to [email or web address].    Your next appointment:   6 month(s)- FEB 2022  The format for your next appointment:   In Person  Provider:   Glenetta Hew, MD   Other Instructions

## 2020-02-12 NOTE — Telephone Encounter (Signed)
Returned call to [facility]-advised Dr. Ellyn Hack aware.

## 2020-02-12 NOTE — Progress Notes (Signed)
Primary Care Provider: Townsend Roger, MD Cardiologist: Glenetta Hew, MD Electrophysiologist: None  Clinic Note: Chief Complaint  Patient presents with  . Follow-up    post hospital #2;  . Atrial Fibrillation  . Stroke Symptoms     HPI:    Sara [name] is a 86 y.o. Sara with a PMH notable for longstanding permanent A. fib (with difficult control rate--previously on amiodarone, stopped because of pulmonary toxicity) with recent CVA who presents today for 70-month follow-up after initially being seen by Sara Sims, NP for hospital follow-up after her stroke.  She also has a history of PAD with PTA and DES of left SFA as well as PTA DES of left above-knee Popliteal Artery (she had a CTO of the left mid and distal SFA as well as P1 segment of the popliteal with two-vessel runoff.  The L posterior tibial was occluded  Sara [name] was last seen by me in clinic on August 12, 2018 as follow-up for her knee surgery.  She was still unable to walk or stand because of severe pain.  Perioperatively, we finally increase her carvedilol dose up and continue diltiazem 180 mg daily.  We did start low-dose digoxin. --> She continued to have elevated heart rates in the nursing facility with rates as high as the 140s but usually 120s during rehab.  Postop course was complicated by pneumonia.  When I saw her she was doing relatively well, stable.  ->  Noted no energy level.  Had a persistent cough getting over pneumonia.  Knee was hurting but better.  Had some balance related dizziness and bilateral brief leg weakness.  Plan was to increase carvedilol to 12.5 mg twice daily; per long discussion, she was using aspirin and Plavix for stroke prevention.  Did not want to go on full anticoagulation.  Recent Hospitalizations/Clinic Visits:   6/18-25/2021: Admitted with stroke-acute onset right-sided weakness and slurred speech.  Unfortunately, symptoms began at roughly 5 PM, but first noticed  by her son at 6 PM.  Symptoms did not resolve and therefore EMS contacted at 1:45 AM.  EMS noted mild slurred speech but no focal weakness. ->  She had stopped taking Plavix 3 weeks prior to this because of blood in her stools.  Unremarkable CT of the head.  Brain MRI showed scattered acute infarcts in the left MCA territory primarily cortical.  Subtle left MCA/ACA watershed.  MRA of the head and neck was negative for any large vessel occlusion but there was extensive intracranial atherosclerosis with probable high-grade distal left ICA stenosis in the subclinoid region, but no significant left MCA or ACA stenosis.  Nondominant right vertebral artery was severely stenotic.  Started on diltiazem infusion for A. fib RVR that was eventually weaned off.  She had significant sundowning during hospitalization. She was changed from aspirin/Plavix to Eliquis. ->  Rates continue to be difficult to control.  We did a short dose of amiodarone and plan to titrate up carvedilol to 25 mg twice daily --> We never really did control her heart rate well.  She was discharged to [facility]  -> was on 25 mg carvedilol with as needed metoprolol 25 mg; complicated by the fact that the target blood pressure goal is 130 to 378 mmHg systolic.   Seen post discharge by Sara Sims, NP on 12/31/2019: She noted that she was having hard time breathing.  She noted that her heart would slow down sometimes and then would spit up sometimes.  She  wake up hard to breathe, but her oxygen levels were fine.  No chest pain or pressure.  No exertional dyspnea but not active.  Was working with physical therapy but still not able to walk without assistance.  Reviewed  CV studies:    The following studies were reviewed today: (if available, images/films reviewed: From Epic Chart or Care Everywhere) . TTE (12/05/2019)-for stroke: EF 60 to 65%.  No R WMA.  Mild LVH.  Unable to assess filling pressures-however moderate LA dilation.  Mild to  moderate MR and TR.  AI.  Mildly reduced RV function.  Estimated severe PA pressures (65 mmHg) along with estimated increased RAP of 15 mmHg. . Carotid Dopplers (12/08/2019): Bilateral ICA 1-39%.  Bilateral vertebral arteries normal.  Bilateral subclavian arteries normal. . She had bilateral ABIs and LEA Dopplers done via [place] (12/30/2019): Right anterior tibial artery was not seen.  R ABI 0.88, L ABI 0.75.  Consistent with mild arterial disease in the right and moderate arterial disease in the left lower extremity.  Doppler waveform in the right deep femoral artery and bilateral posterior tibial arteries compared with moderate to severe stenoses. o From June 2020: R ABI 0.75, L ABI 1.07   Interval History:   Sara [name] presents sleep stating that she is feeling pretty well.  She still has some right hand numbness and weakness with Dr. Silvio Clayman.  Her leg is ill weak foot moving better.  She seems to be relatively happy with her progression.  She says that the physical therapy staff seem to be happy with her progression as well.  She is now no longer on oxygen and not having that shortness of breath episodes.  No PND orthopnea.  No real pedal edema.  She says that her heart rates are usually okay she has not noticed whether they are going fast or not.  She has not been given any additional doses of metoprolol.  CV Review of Symptoms (Summary) Cardiovascular ROS: no chest pain or dyspnea on exertion positive for - irregular heartbeat, palpitations, rapid heart rate and Occasional orthopnea, but not very frequently. negative for - edema, paroxysmal nocturnal dyspnea, shortness of breath or Syncope/near syncope or TIA/amaurosis fugax.  Stable neurologic findings.  Not really walking, therefore unable to assess for claudication.  The patient does not have symptoms concerning for COVID-19 infection (fever, chills, cough, or new shortness of breath).  The patient is practicing social  distancing & Masking.    REVIEWED OF SYSTEMS   Review of Systems  Constitutional: Negative for malaise/fatigue (She seems to be energetic) and weight loss.  HENT: Negative for nosebleeds.   Respiratory: Negative for shortness of breath.   Gastrointestinal: Negative for blood in stool and melena.  Genitourinary: Negative for hematuria.  Musculoskeletal: Positive for joint pain (Knee still hurt).  Neurological: Positive for dizziness (Little bit when she stands up, more balance) and focal weakness (Right leg is weak.  Left arm is not as weak, just less dexterous hand). Negative for headaches.  Psychiatric/Behavioral: Positive for memory loss. Negative for depression. The patient has insomnia. The patient is not nervous/anxious.    I have reviewed and (if needed) personally updated the patient's problem list, medications, allergies, past medical and surgical history, social and family history.   PAST MEDICAL HISTORY   Past Medical History:  Diagnosis Date  . Ankle edema    Mild, which is intermittent, usually mildly dependent, left slightly greater than the right.  . Anxiety   .  Arthritis    In her knees  . Balance problem    Due to weakened knee  . Chronic diastolic heart failure (HCC)    Class I-II -- exacerbated by Afib RVR  . Corneal edema    Severe corneal edema in right eye with multiple folds. Developed after cataract surgery 05/03/09  . Dysrhythmia    a-fib    since aghe 86 yrs old  . GERD (gastroesophageal reflux disease)   . Hematuria    Resolved after coumadin was stopped  . Hiatal hernia    With GERD  . History of breast cancer   . HOH (hard of hearing)   . Hyperglycemia    Random, without any history of diabetes  . Hyperlipidemia   . Hypertension    Difficult to control. Renal Doppler 06/02/10 showed less that 60% bilateral renal artery narrowing.  . Hypokalemia   . PAD (peripheral artery disease) ([place]) 2019   L SFA-Pop A DES Stenting for CTO of LSFA  .  Permanent atrial fibrillation (HCC)    Rate control.  ASA.  NOT on DOAC or warfarin due to concern for falls &bleeding.  Marland Kitchen Posthemorrhagic anemia   . Pseudophakia   . Syncope 02/22/11   During OV on 02/22/11 her INR was 7.3 and was given 2.5 mg of vitamin K. Later that day she became diaphoretic & fainted.  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 ABDOMINAL HYSTERECTOMY    . BREAST BIOPSY     3 biopsies  . CARDIOVERSION N/A 03/31/2016   Procedure: CARDIOVERSION;  Surgeon: Jerline Pain, MD;  Location: [facility] ENDOSCOPY;  Service: Cardiovascular: Successful cardioversion from A. fib to sinus rhythm  . CATARACT EXTRACTION  05/03/09  . CHOLECYSTECTOMY    . EYE SURGERY     cataract  . HEMORRHOID SURGERY    . LOWER EXTREMITY ANGIOGRAPHY Right 03/18/2018   Procedure: LOWER EXTREMITY ANGIOGRAPHY;  Surgeon: Lorretta Harp, MD;  Location: [facility] CV LAB;  Service: Cardiovascular;  Laterality: Right;  . PERIPHERAL VASCULAR INTERVENTION  03/18/2018   Procedure: PERIPHERAL VASCULAR INTERVENTION;  Surgeon: Lorretta Harp, MD;  Location: [facility] CV [facility];  Service: Cardiovascular;;  left SFA  . Remote hysterectomy    . TEE WITHOUT CARDIOVERSION N/A 03/31/2016   Procedure: TRANSESOPHAGEAL ECHOCARDIOGRAM (TEE);  Surgeon: Jerline Pain, MD;  Location: [facility];  Service: Cardiovascular:  EF 55-60%. No vegetation or thrombus okay for DC CV  . TONSILLECTOMY    . TOTAL KNEE ARTHROPLASTY Right 08/26/10   By Dr. Elta Guadeloupe C. Yates. Cemented, computer assist  . TOTAL KNEE ARTHROPLASTY Left 07/08/2018   Procedure: LEFT TOTAL KNEE ARTHROPLASTY CEMENTED;  Surgeon: Marybelle Killings, MD;  Location: [facility];  Service: Orthopedics;  Laterality: Left;  . TRANSTHORACIC ECHOCARDIOGRAM  03/2016    Mild LVH. EF 65-70%. High LVEDP no RWMA. Severe LA dilation. Peak PAP ~ 37 mmHg    MEDICATIONS/ALLERGIES   Current Meds  Medication Sig   . acetaminophen (TYLENOL) 325 MG tablet Take 650 mg by mouth every 6 (six) hours as needed.  Marland Kitchen apixaban (ELIQUIS) 5 MG TABS tablet Take 1 tablet (5 mg total) by mouth 2 (two) times daily.  . carvedilol (COREG) 25 MG tablet Take 1 tablet (25 mg total) by mouth 2 (two) times daily with a meal.  . ezetimibe (ZETIA) 10 MG tablet Take 1 tablet (10 mg  total) by mouth daily.  . melatonin 5 MG TABS Take 1 tablet (5 mg total) by mouth at bedtime as needed (sleep).  . potassium chloride (KLOR-CON) 20 MEQ packet Take 20 mEq by mouth daily.  Marland Kitchen rOPINIRole (REQUIP) 1 MG tablet Take 1 tablet by mouth at bedtime.    Immunization History  Administered Date(s) Administered  . Influenza, High Dose Seasonal PF 03/19/2018  . Influenza,inj,Quad PF,6+ Mos 05/18/2016  -- by report, has had COVID Vaccines  Allergies  Allergen Reactions  . Amiodarone Shortness Of Breath and Other (See Comments)    Stopped in Feb 2017 secondary to pulmonary complications  . Aliskiren Other (See Comments) and Nausea Only    unknown  . Amlodipine Besylate-Valsartan Other (See Comments) and Nausea Only    Unclear  . Bystolic [Nebivolol Hcl] Other (See Comments)    unknown  . Clonidine Derivatives Other (See Comments)    unknown  . Dronedarone Nausea And Vomiting and Nausea Only  . Hctz [Hydrochlorothiazide] Other (See Comments)    Currently taking without problem; question if this has to do with hypokalemia  . Hydralazine Other (See Comments)    Also tolerated, but had orthostatic changes on standing medication  . Lisinopril Other (See Comments) and Nausea Only    unknown  . Nebivolol Nausea Only  . Olmesartan Nausea Only and Other (See Comments)    Unclear of the intolerance  . Oxycodone     Patient states decreased heart rate, decreased blood pressure. List as allergy.  . Rythmol [Propafenone] Other (See Comments)    unknown  . Statins Other (See Comments) and Nausea Only    unknown  . Carvedilol Other (See Comments)  and Nausea And Vomiting    fatigue  . Prednisone Nausea And Vomiting and Other (See Comments)    Jittery     SOCIAL HISTORY/FAMILY HISTORY   Reviewed in Epic:  Pertinent findings:  Still @ [facility] (since CVA)  OBJCTIVE -PE, EKG, labs   Wt Readings from Last 3 Encounters:  02/12/20 149 lb (67.6 kg)  12/31/19 150 lb (68 kg)  12/05/19 151 lb 3.8 oz (68.6 kg)    Physical Exam: BP 128/72   Pulse (!) 109   Ht 5' (1.524 m)   Wt 149 lb (67.6 kg)   SpO2 98%   BMI 29.10 kg/m  Physical Exam Constitutional:      General: She is not in acute distress.    Appearance: Normal appearance. She is normal weight. She is ill-appearing (Chronically ill-appearing, but nontoxic).  HENT:     Head: Normocephalic and atraumatic.  Neck:     Vascular: No carotid bruit.     Comments: No JVD or HJR Cardiovascular:     Rate and Rhythm: Tachycardia present. Rhythm irregularly irregular.     Chest Wall: PMI is not displaced.     Pulses: Decreased pulses (Mild pedal pulses but palpable DP.  PT week on the left.).     Heart sounds: Normal heart sounds, S1 normal and S2 normal. No murmur heard.  No friction rub. No gallop.   Pulmonary:     Effort: Pulmonary effort is normal. No respiratory distress.     Breath sounds: Normal breath sounds.  Musculoskeletal:        General: No swelling or tenderness.     Cervical back: Normal range of motion.  Neurological:     Mental Status: She is alert and oriented to person, place, and time.     Comments: Able  to assess gait, is in a wheelchair.  Left leg is definitely weaker  Psychiatric:        Mood and Affect: Mood normal.        Behavior: Behavior normal.        Judgment: Judgment normal.     Comments: Pretty poor recall.  Difficult historian.     Adult ECG Report  Rate: 109 ;  Rhythm: atrial fibrillation and RBBB with repolarization abnormalities-cannot exclude inferolateral ischemia.  Inversions..;  Narrative Interpretation: Relatively stable.   Rate not fully controlled  Recent Labs:   Lab Results  Component Value Date   CHOL 182 12/05/2019   HDL 44 12/05/2019   LDLCALC 126 (H) 12/05/2019   TRIG 58 12/05/2019   CHOLHDL 4.1 12/05/2019   Lab Results  Component Value Date   CREATININE 0.63 12/31/2019   BUN 10 12/31/2019   NA 145 (H) 12/31/2019   K 4.9 12/31/2019   CL 109 (H) 12/31/2019   CO2 25 12/31/2019   Lab Results  Component Value Date   TSH 1.769 12/05/2019    ASSESSMENT/PLAN    Problem List Items Addressed This Visit    Chronic diastolic CHF (congestive heart failure), NYHA class 1 (HCC) (Chronic)    Seems euvolemic on exam - no diuretic requirement for now. Not sure what to make of intermittent orthopnea - hold off on diuretic for now.      Relevant Medications   diltiazem (CARDIZEM) 60 MG tablet   Other Relevant Orders   EKG 12-Lead (Completed)   History of ischemic left MCA stroke (Chronic)    Now converted to Eliquis (interestingly, her CVA occurred during her hiatus from taking Plavix for recent GIB).  Still has residual R leg weakness with R hand numbness / lack of dexterity, but no longer with speech issues.      Relevant Medications   diltiazem (CARDIZEM) 60 MG tablet   Essential hypertension (Chronic)    Stable BP - has room for PRN HR control in addition to Carvedilol 25 mg BID.      Relevant Medications   diltiazem (CARDIZEM) 60 MG tablet   Peripheral arterial disease (HCC) (Chronic)    L ABI lower by recent ABIs- see scanned report.  No claudication (not walking post CVA) -- continue to encourage PT ambulation.      Relevant Medications   diltiazem (CARDIZEM) 60 MG tablet   Permanent atrial fibrillation (HCC) - Primary    Still having difficulty with rate control (really ever since stopping amiodarone years ago).    Plan: continue full dose 25 mg BID Carvedilol.  Add PRN Diltiazem 60 mg (q 8 hr PRN) for HR > 120 bpm x > 30 min  Converted to Eliquis - no bleeding.  This  patients CHA2DS2-VASc Score and unadjusted Ischemic Stroke Rate (% per year) is equal to 11.2 % stroke rate/year from a score of 7  Above score calculated as 1 point each if present [CHF, HTN, DM, Vascular=MI/Aortic Pla/PADque, Age if 38-74, or Sara] Above score calculated as 2 points each if present [Age > 75, or Stroke/TIA/TE]        Relevant Medications   diltiazem (CARDIZEM) 60 MG tablet       COVID-19 Education: The signs and symptoms of COVID-19 were discussed with the patient and how to seek care for testing (follow up with PCP or arrange E-visit).   The importance of social distancing and COVID-19 vaccination was discussed today.  I spent a total of  25 minutes with the patient in direct patient consultation.  Additional time spent with chart review  / charting (studies, outside notes, etc): 16 Total Time: 41 min   Current medicines are reviewed at length with the patient today.  (+/- concerns) n/a  Notice: This dictation was prepared with Dragon dictation along with smaller phrase technology. Any transcriptional errors that result from this process are unintentional and may not be corrected upon review.  Patient Instructions / Medication Changes & Studies & Tests Ordered   Patient Instructions  Medication Instructions:   DILTIAZEM 60 MG --  AS NEEDED TAKE 60 MG BY MOUTH IF HEART RATE IS EQUAL OR ABOVE 120 FORAT LEAST 30 3OMINS MAY REPEAT EVERY 8 HOURS IF NEEDED   NO OTHER CHANGES  *If you need a refill on yo ur cardiac medications before your next appointment, please call your pharmacy*   Lab Work: NOT NEEDED   Testing/Procedures: NOT NEEDED   Follow-Up: At [facility], you and your health needs are our priority.  As part of our continuing mission to provide you with exceptional heart care, we have created designated Provider Care Teams.  These Care Teams include your primary Cardiologist (physician) and Advanced Practice Providers (APPs -  Physician  Assistants and Nurse Practitioners) who all work together to provide you with the care you need, when you need it.  We recommend signing up for the patient portal called "MyChart".  Sign up information is provided on this After Visit Summary.  MyChart is used to connect with patients for Virtual Visits (Telemedicine).  Patients are able to view lab/test results, encounter notes, upcoming appointments, etc.  Non-urgent messages can be sent to your provider as well.   To learn more about what you can do with MyChart, go to [email or web address].    Your next appointment:   6 month(s)- FEB 2022  The format for your next appointment:   In Person  Provider:   Glenetta Hew, MD   Other Instructions     Studies Ordered:   Orders Placed This Encounter  Procedures  . EKG 12-Lead     Glenetta Hew, M.D., M.S. Interventional Cardiologist   Pager # [phone] Phone # [phone] [address] [place], [place] [address]   Thank you for choosing Heartcare at [facility]!!

## 2020-02-16 DIAGNOSIS — K219 Gastro-esophageal reflux disease without esophagitis: Secondary | ICD-10-CM | POA: Diagnosis not present

## 2020-02-16 DIAGNOSIS — I4891 Unspecified atrial fibrillation: Secondary | ICD-10-CM | POA: Diagnosis not present

## 2020-02-16 DIAGNOSIS — E785 Hyperlipidemia, unspecified: Secondary | ICD-10-CM | POA: Diagnosis not present

## 2020-02-16 DIAGNOSIS — I739 Peripheral vascular disease, unspecified: Secondary | ICD-10-CM | POA: Diagnosis not present

## 2020-02-19 ENCOUNTER — Encounter: Payer: Self-pay | Admitting: Cardiology

## 2020-02-19 NOTE — Assessment & Plan Note (Signed)
Seems euvolemic on exam - no diuretic requirement for now. Not sure what to make of intermittent orthopnea - hold off on diuretic for now.

## 2020-02-19 NOTE — Assessment & Plan Note (Signed)
L ABI lower by recent ABIs- see scanned report.  No claudication (not walking post CVA) -- continue to encourage PT ambulation.

## 2020-02-19 NOTE — Assessment & Plan Note (Addendum)
Now converted to Eliquis (interestingly, her CVA occurred during her hiatus from taking Plavix for recent GIB).  Still has residual R leg weakness with R hand numbness / lack of dexterity, but no longer with speech issues.

## 2020-02-19 NOTE — Assessment & Plan Note (Signed)
Still having difficulty with rate control (really ever since stopping amiodarone years ago).    Plan: continue full dose 25 mg BID Carvedilol.  Add PRN Diltiazem 60 mg (q 8 hr PRN) for HR > 120 bpm x > 30 min  Converted to Eliquis - no bleeding.  This patients CHA2DS2-VASc Score and unadjusted Ischemic Stroke Rate (% per year) is equal to 11.2 % stroke rate/year from a score of 7  Above score calculated as 1 point each if present [CHF, HTN, DM, Vascular=MI/Aortic Pla/PADque, Age if 11-74, or Female] Above score calculated as 2 points each if present [Age > 75, or Stroke/TIA/TE]

## 2020-02-19 NOTE — Assessment & Plan Note (Signed)
Stable BP - has room for PRN HR control in addition to Carvedilol 25 mg BID.

## 2020-02-26 DIAGNOSIS — G4700 Insomnia, unspecified: Secondary | ICD-10-CM | POA: Diagnosis not present

## 2020-03-17 DIAGNOSIS — I5032 Chronic diastolic (congestive) heart failure: Secondary | ICD-10-CM | POA: Diagnosis not present

## 2020-03-17 DIAGNOSIS — I4891 Unspecified atrial fibrillation: Secondary | ICD-10-CM | POA: Diagnosis not present

## 2020-03-17 DIAGNOSIS — K219 Gastro-esophageal reflux disease without esophagitis: Secondary | ICD-10-CM | POA: Diagnosis not present

## 2020-03-17 DIAGNOSIS — E785 Hyperlipidemia, unspecified: Secondary | ICD-10-CM | POA: Diagnosis not present

## 2020-03-24 ENCOUNTER — Other Ambulatory Visit: Payer: Self-pay

## 2020-03-24 DIAGNOSIS — I69351 Hemiplegia and hemiparesis following cerebral infarction affecting right dominant side: Secondary | ICD-10-CM | POA: Diagnosis not present

## 2020-03-24 DIAGNOSIS — E785 Hyperlipidemia, unspecified: Secondary | ICD-10-CM | POA: Diagnosis not present

## 2020-03-24 DIAGNOSIS — I69322 Dysarthria following cerebral infarction: Secondary | ICD-10-CM | POA: Diagnosis not present

## 2020-03-24 DIAGNOSIS — I110 Hypertensive heart disease with heart failure: Secondary | ICD-10-CM | POA: Diagnosis not present

## 2020-03-24 DIAGNOSIS — I2510 Atherosclerotic heart disease of native coronary artery without angina pectoris: Secondary | ICD-10-CM | POA: Diagnosis not present

## 2020-03-24 DIAGNOSIS — I4821 Permanent atrial fibrillation: Secondary | ICD-10-CM | POA: Diagnosis not present

## 2020-03-24 DIAGNOSIS — I5032 Chronic diastolic (congestive) heart failure: Secondary | ICD-10-CM | POA: Diagnosis not present

## 2020-03-24 DIAGNOSIS — G2581 Restless legs syndrome: Secondary | ICD-10-CM | POA: Diagnosis not present

## 2020-03-24 DIAGNOSIS — I739 Peripheral vascular disease, unspecified: Secondary | ICD-10-CM | POA: Diagnosis not present

## 2020-03-24 NOTE — Patient Outreach (Signed)
[facility]) Care Management  03/24/2020  Sara [name] 03-05-1926 [id number]   Referral Date: 03/24/20 Referral Source: [employer] Report Date of Discharge: 03/22/20 Facility:  [place]: [facility]   Referral received.  No outreach warranted at this time.  Transition of Care calls being completed via EMMI. RN CM will outreach patient for any red flags received.    Plan: RN CM will close case.    Jone Baseman, RN, MSN [facility] Care Management Care Management Coordinator Direct Line [phone] Toll Free: [phone]  Fax: [phone]

## 2020-03-25 DIAGNOSIS — I69322 Dysarthria following cerebral infarction: Secondary | ICD-10-CM | POA: Diagnosis not present

## 2020-03-25 DIAGNOSIS — I2510 Atherosclerotic heart disease of native coronary artery without angina pectoris: Secondary | ICD-10-CM | POA: Diagnosis not present

## 2020-03-25 DIAGNOSIS — I5032 Chronic diastolic (congestive) heart failure: Secondary | ICD-10-CM | POA: Diagnosis not present

## 2020-03-25 DIAGNOSIS — I110 Hypertensive heart disease with heart failure: Secondary | ICD-10-CM | POA: Diagnosis not present

## 2020-03-25 DIAGNOSIS — I69351 Hemiplegia and hemiparesis following cerebral infarction affecting right dominant side: Secondary | ICD-10-CM | POA: Diagnosis not present

## 2020-03-25 DIAGNOSIS — I739 Peripheral vascular disease, unspecified: Secondary | ICD-10-CM | POA: Diagnosis not present

## 2020-03-25 DIAGNOSIS — G2581 Restless legs syndrome: Secondary | ICD-10-CM | POA: Diagnosis not present

## 2020-03-25 DIAGNOSIS — E785 Hyperlipidemia, unspecified: Secondary | ICD-10-CM | POA: Diagnosis not present

## 2020-03-25 DIAGNOSIS — I4821 Permanent atrial fibrillation: Secondary | ICD-10-CM | POA: Diagnosis not present

## 2020-03-27 DIAGNOSIS — G2581 Restless legs syndrome: Secondary | ICD-10-CM | POA: Diagnosis not present

## 2020-03-27 DIAGNOSIS — I739 Peripheral vascular disease, unspecified: Secondary | ICD-10-CM | POA: Diagnosis not present

## 2020-03-27 DIAGNOSIS — E785 Hyperlipidemia, unspecified: Secondary | ICD-10-CM | POA: Diagnosis not present

## 2020-03-27 DIAGNOSIS — I5032 Chronic diastolic (congestive) heart failure: Secondary | ICD-10-CM | POA: Diagnosis not present

## 2020-03-27 DIAGNOSIS — I110 Hypertensive heart disease with heart failure: Secondary | ICD-10-CM | POA: Diagnosis not present

## 2020-03-27 DIAGNOSIS — I4821 Permanent atrial fibrillation: Secondary | ICD-10-CM | POA: Diagnosis not present

## 2020-03-27 DIAGNOSIS — I2510 Atherosclerotic heart disease of native coronary artery without angina pectoris: Secondary | ICD-10-CM | POA: Diagnosis not present

## 2020-03-27 DIAGNOSIS — I69322 Dysarthria following cerebral infarction: Secondary | ICD-10-CM | POA: Diagnosis not present

## 2020-03-27 DIAGNOSIS — I69351 Hemiplegia and hemiparesis following cerebral infarction affecting right dominant side: Secondary | ICD-10-CM | POA: Diagnosis not present

## 2020-03-29 DIAGNOSIS — I739 Peripheral vascular disease, unspecified: Secondary | ICD-10-CM | POA: Diagnosis not present

## 2020-03-29 DIAGNOSIS — E785 Hyperlipidemia, unspecified: Secondary | ICD-10-CM | POA: Diagnosis not present

## 2020-03-29 DIAGNOSIS — I2510 Atherosclerotic heart disease of native coronary artery without angina pectoris: Secondary | ICD-10-CM | POA: Diagnosis not present

## 2020-03-29 DIAGNOSIS — G2581 Restless legs syndrome: Secondary | ICD-10-CM | POA: Diagnosis not present

## 2020-03-29 DIAGNOSIS — I69322 Dysarthria following cerebral infarction: Secondary | ICD-10-CM | POA: Diagnosis not present

## 2020-03-29 DIAGNOSIS — I5032 Chronic diastolic (congestive) heart failure: Secondary | ICD-10-CM | POA: Diagnosis not present

## 2020-03-29 DIAGNOSIS — I110 Hypertensive heart disease with heart failure: Secondary | ICD-10-CM | POA: Diagnosis not present

## 2020-03-29 DIAGNOSIS — I69351 Hemiplegia and hemiparesis following cerebral infarction affecting right dominant side: Secondary | ICD-10-CM | POA: Diagnosis not present

## 2020-03-29 DIAGNOSIS — I4821 Permanent atrial fibrillation: Secondary | ICD-10-CM | POA: Diagnosis not present

## 2020-04-01 DIAGNOSIS — I69351 Hemiplegia and hemiparesis following cerebral infarction affecting right dominant side: Secondary | ICD-10-CM | POA: Diagnosis not present

## 2020-04-01 DIAGNOSIS — I110 Hypertensive heart disease with heart failure: Secondary | ICD-10-CM | POA: Diagnosis not present

## 2020-04-01 DIAGNOSIS — I4821 Permanent atrial fibrillation: Secondary | ICD-10-CM | POA: Diagnosis not present

## 2020-04-01 DIAGNOSIS — I69322 Dysarthria following cerebral infarction: Secondary | ICD-10-CM | POA: Diagnosis not present

## 2020-04-01 DIAGNOSIS — I2510 Atherosclerotic heart disease of native coronary artery without angina pectoris: Secondary | ICD-10-CM | POA: Diagnosis not present

## 2020-04-01 DIAGNOSIS — G2581 Restless legs syndrome: Secondary | ICD-10-CM | POA: Diagnosis not present

## 2020-04-01 DIAGNOSIS — I739 Peripheral vascular disease, unspecified: Secondary | ICD-10-CM | POA: Diagnosis not present

## 2020-04-01 DIAGNOSIS — E785 Hyperlipidemia, unspecified: Secondary | ICD-10-CM | POA: Diagnosis not present

## 2020-04-01 DIAGNOSIS — I5032 Chronic diastolic (congestive) heart failure: Secondary | ICD-10-CM | POA: Diagnosis not present

## 2020-04-02 DIAGNOSIS — I69322 Dysarthria following cerebral infarction: Secondary | ICD-10-CM | POA: Diagnosis not present

## 2020-04-02 DIAGNOSIS — I4821 Permanent atrial fibrillation: Secondary | ICD-10-CM | POA: Diagnosis not present

## 2020-04-02 DIAGNOSIS — I5032 Chronic diastolic (congestive) heart failure: Secondary | ICD-10-CM | POA: Diagnosis not present

## 2020-04-02 DIAGNOSIS — I69351 Hemiplegia and hemiparesis following cerebral infarction affecting right dominant side: Secondary | ICD-10-CM | POA: Diagnosis not present

## 2020-04-02 DIAGNOSIS — E785 Hyperlipidemia, unspecified: Secondary | ICD-10-CM | POA: Diagnosis not present

## 2020-04-02 DIAGNOSIS — I739 Peripheral vascular disease, unspecified: Secondary | ICD-10-CM | POA: Diagnosis not present

## 2020-04-02 DIAGNOSIS — I2510 Atherosclerotic heart disease of native coronary artery without angina pectoris: Secondary | ICD-10-CM | POA: Diagnosis not present

## 2020-04-02 DIAGNOSIS — I110 Hypertensive heart disease with heart failure: Secondary | ICD-10-CM | POA: Diagnosis not present

## 2020-04-02 DIAGNOSIS — G2581 Restless legs syndrome: Secondary | ICD-10-CM | POA: Diagnosis not present

## 2020-04-03 ENCOUNTER — Telehealth: Payer: Self-pay | Admitting: Medical

## 2020-04-03 ENCOUNTER — Other Ambulatory Visit: Payer: Self-pay | Admitting: Cardiovascular Disease

## 2020-04-03 MED ORDER — CARVEDILOL 25 MG PO TABS: 25.0000 mg | ORAL_TABLET | Freq: Two times a day (BID) | ORAL | 6 refills | Status: DC

## 2020-04-03 MED ORDER — APIXABAN 5 MG PO TABS: 5.0000 mg | ORAL_TABLET | Freq: Two times a day (BID) | ORAL | 6 refills | Status: DC

## 2020-04-03 NOTE — Telephone Encounter (Signed)
   Patient recently discharged from rehab and requesting refills of eliquis and carvedilol. Meds sent to local [facility] as preferred pharmacy was closed.   Abigail Butts, PA-C 04/03/20; 2:25 PM

## 2020-04-04 MED ORDER — APIXABAN 5 MG PO TABS: 5.0000 mg | ORAL_TABLET | Freq: Two times a day (BID) | ORAL | 6 refills | Status: DC

## 2020-04-04 MED ORDER — CARVEDILOL 25 MG PO TABS: 25.0000 mg | ORAL_TABLET | Freq: Two times a day (BID) | ORAL | 6 refills | Status: AC

## 2020-04-04 MED ORDER — APIXABAN 5 MG PO TABS: 5.0000 mg | ORAL_TABLET | Freq: Two times a day (BID) | ORAL | 6 refills | Status: AC

## 2020-04-04 MED ORDER — CARVEDILOL 25 MG PO TABS: 25.0000 mg | ORAL_TABLET | Freq: Two times a day (BID) | ORAL | 6 refills | Status: DC

## 2020-04-04 NOTE — Addendum Note (Signed)
Addended by: Roby Lofts on: 04/04/2020 03:53 PM   Modules accepted: Orders

## 2020-04-04 NOTE — Addendum Note (Signed)
Addended by: Roby Lofts on: 04/04/2020 11:13 AM   Modules accepted: Orders

## 2020-04-05 ENCOUNTER — Telehealth: Payer: Self-pay | Admitting: Orthopaedic Surgery

## 2020-04-05 NOTE — Telephone Encounter (Signed)
Patient's son Patrick Jupiter wants to know if we know of any transportation companies that he could get his mom transported from [place] to [place] to and from doctor appts. The one patient has now is suspended until further notice. Please contact son about this matter if we know of any. [place] phone number is [phone].

## 2020-04-06 ENCOUNTER — Ambulatory Visit: Payer: Medicare HMO | Admitting: Orthopaedic Surgery

## 2020-04-06 DIAGNOSIS — I4821 Permanent atrial fibrillation: Secondary | ICD-10-CM | POA: Diagnosis not present

## 2020-04-06 DIAGNOSIS — I110 Hypertensive heart disease with heart failure: Secondary | ICD-10-CM | POA: Diagnosis not present

## 2020-04-06 DIAGNOSIS — G2581 Restless legs syndrome: Secondary | ICD-10-CM | POA: Diagnosis not present

## 2020-04-06 DIAGNOSIS — I69351 Hemiplegia and hemiparesis following cerebral infarction affecting right dominant side: Secondary | ICD-10-CM | POA: Diagnosis not present

## 2020-04-06 DIAGNOSIS — I69322 Dysarthria following cerebral infarction: Secondary | ICD-10-CM | POA: Diagnosis not present

## 2020-04-06 DIAGNOSIS — I5032 Chronic diastolic (congestive) heart failure: Secondary | ICD-10-CM | POA: Diagnosis not present

## 2020-04-06 DIAGNOSIS — I2510 Atherosclerotic heart disease of native coronary artery without angina pectoris: Secondary | ICD-10-CM | POA: Diagnosis not present

## 2020-04-06 DIAGNOSIS — E785 Hyperlipidemia, unspecified: Secondary | ICD-10-CM | POA: Diagnosis not present

## 2020-04-06 DIAGNOSIS — I739 Peripheral vascular disease, unspecified: Secondary | ICD-10-CM | POA: Diagnosis not present

## 2020-04-07 DIAGNOSIS — G2581 Restless legs syndrome: Secondary | ICD-10-CM | POA: Diagnosis not present

## 2020-04-07 DIAGNOSIS — I5032 Chronic diastolic (congestive) heart failure: Secondary | ICD-10-CM | POA: Diagnosis not present

## 2020-04-07 DIAGNOSIS — I110 Hypertensive heart disease with heart failure: Secondary | ICD-10-CM | POA: Diagnosis not present

## 2020-04-07 DIAGNOSIS — E785 Hyperlipidemia, unspecified: Secondary | ICD-10-CM | POA: Diagnosis not present

## 2020-04-07 DIAGNOSIS — I69322 Dysarthria following cerebral infarction: Secondary | ICD-10-CM | POA: Diagnosis not present

## 2020-04-07 DIAGNOSIS — I739 Peripheral vascular disease, unspecified: Secondary | ICD-10-CM | POA: Diagnosis not present

## 2020-04-07 DIAGNOSIS — I4821 Permanent atrial fibrillation: Secondary | ICD-10-CM | POA: Diagnosis not present

## 2020-04-07 DIAGNOSIS — I2510 Atherosclerotic heart disease of native coronary artery without angina pectoris: Secondary | ICD-10-CM | POA: Diagnosis not present

## 2020-04-07 DIAGNOSIS — I69351 Hemiplegia and hemiparesis following cerebral infarction affecting right dominant side: Secondary | ICD-10-CM | POA: Diagnosis not present

## 2020-04-08 DIAGNOSIS — G2581 Restless legs syndrome: Secondary | ICD-10-CM | POA: Diagnosis not present

## 2020-04-08 DIAGNOSIS — I739 Peripheral vascular disease, unspecified: Secondary | ICD-10-CM | POA: Diagnosis not present

## 2020-04-08 DIAGNOSIS — I5032 Chronic diastolic (congestive) heart failure: Secondary | ICD-10-CM | POA: Diagnosis not present

## 2020-04-08 DIAGNOSIS — I4821 Permanent atrial fibrillation: Secondary | ICD-10-CM | POA: Diagnosis not present

## 2020-04-08 DIAGNOSIS — I110 Hypertensive heart disease with heart failure: Secondary | ICD-10-CM | POA: Diagnosis not present

## 2020-04-08 DIAGNOSIS — E785 Hyperlipidemia, unspecified: Secondary | ICD-10-CM | POA: Diagnosis not present

## 2020-04-08 DIAGNOSIS — I2510 Atherosclerotic heart disease of native coronary artery without angina pectoris: Secondary | ICD-10-CM | POA: Diagnosis not present

## 2020-04-08 DIAGNOSIS — I69322 Dysarthria following cerebral infarction: Secondary | ICD-10-CM | POA: Diagnosis not present

## 2020-04-08 DIAGNOSIS — I69351 Hemiplegia and hemiparesis following cerebral infarction affecting right dominant side: Secondary | ICD-10-CM | POA: Diagnosis not present

## 2020-04-09 DIAGNOSIS — I4821 Permanent atrial fibrillation: Secondary | ICD-10-CM | POA: Diagnosis not present

## 2020-04-09 DIAGNOSIS — I110 Hypertensive heart disease with heart failure: Secondary | ICD-10-CM | POA: Diagnosis not present

## 2020-04-09 DIAGNOSIS — I69351 Hemiplegia and hemiparesis following cerebral infarction affecting right dominant side: Secondary | ICD-10-CM | POA: Diagnosis not present

## 2020-04-09 DIAGNOSIS — I69322 Dysarthria following cerebral infarction: Secondary | ICD-10-CM | POA: Diagnosis not present

## 2020-04-09 DIAGNOSIS — E785 Hyperlipidemia, unspecified: Secondary | ICD-10-CM | POA: Diagnosis not present

## 2020-04-09 DIAGNOSIS — G2581 Restless legs syndrome: Secondary | ICD-10-CM | POA: Diagnosis not present

## 2020-04-09 DIAGNOSIS — I739 Peripheral vascular disease, unspecified: Secondary | ICD-10-CM | POA: Diagnosis not present

## 2020-04-09 DIAGNOSIS — I2510 Atherosclerotic heart disease of native coronary artery without angina pectoris: Secondary | ICD-10-CM | POA: Diagnosis not present

## 2020-04-09 DIAGNOSIS — I5032 Chronic diastolic (congestive) heart failure: Secondary | ICD-10-CM | POA: Diagnosis not present

## 2020-04-09 NOTE — Telephone Encounter (Signed)
I left voicemail advising. ?

## 2020-04-09 NOTE — Telephone Encounter (Signed)
Usually [facility] members have transportation benefits. They just have to call the customer service number on the back of their insurance card and ask about transportation services through [employer]. If that doesn't work, then they could call [facility]'s transportation department and see if they can get help for appointments. [phone].

## 2020-04-09 NOTE — Telephone Encounter (Signed)
I have looked and am not aware of any services.  Do you know of anywhere that I could recommend?

## 2020-04-12 ENCOUNTER — Telehealth: Payer: Self-pay | Admitting: Cardiology

## 2020-04-12 DIAGNOSIS — I2510 Atherosclerotic heart disease of native coronary artery without angina pectoris: Secondary | ICD-10-CM | POA: Diagnosis not present

## 2020-04-12 DIAGNOSIS — I739 Peripheral vascular disease, unspecified: Secondary | ICD-10-CM | POA: Diagnosis not present

## 2020-04-12 DIAGNOSIS — I5032 Chronic diastolic (congestive) heart failure: Secondary | ICD-10-CM | POA: Diagnosis not present

## 2020-04-12 DIAGNOSIS — I110 Hypertensive heart disease with heart failure: Secondary | ICD-10-CM | POA: Diagnosis not present

## 2020-04-12 DIAGNOSIS — I4821 Permanent atrial fibrillation: Secondary | ICD-10-CM | POA: Diagnosis not present

## 2020-04-12 DIAGNOSIS — I69351 Hemiplegia and hemiparesis following cerebral infarction affecting right dominant side: Secondary | ICD-10-CM | POA: Diagnosis not present

## 2020-04-12 DIAGNOSIS — I69322 Dysarthria following cerebral infarction: Secondary | ICD-10-CM | POA: Diagnosis not present

## 2020-04-12 DIAGNOSIS — E785 Hyperlipidemia, unspecified: Secondary | ICD-10-CM | POA: Diagnosis not present

## 2020-04-12 DIAGNOSIS — G2581 Restless legs syndrome: Secondary | ICD-10-CM | POA: Diagnosis not present

## 2020-04-12 NOTE — Telephone Encounter (Signed)
*  STAT* If patient is at the pharmacy, call can be transferred to refill team.   1. Which medications need to be refilled? (please list name of each medication and dose if known) ezetimibe (ZETIA) 10 MG tablet  rOPINIRole (REQUIP) 1 MG tablet  2. Which pharmacy/location (including street and city if local pharmacy) is medication to be sent to? [place], [place]  3. Do they need a 30 day or 90 day supply? 30 day

## 2020-04-13 MED ORDER — EZETIMIBE 10 MG PO TABS: 10.0000 mg | ORAL_TABLET | Freq: Every day | ORAL | 10 refills | Status: AC

## 2020-04-13 NOTE — Telephone Encounter (Signed)
zetia refilled requip is non-cardiac medication, not refilled by Dr. Ellyn Hack

## 2020-04-14 DIAGNOSIS — E785 Hyperlipidemia, unspecified: Secondary | ICD-10-CM | POA: Diagnosis not present

## 2020-04-14 DIAGNOSIS — I69322 Dysarthria following cerebral infarction: Secondary | ICD-10-CM | POA: Diagnosis not present

## 2020-04-14 DIAGNOSIS — I69351 Hemiplegia and hemiparesis following cerebral infarction affecting right dominant side: Secondary | ICD-10-CM | POA: Diagnosis not present

## 2020-04-14 DIAGNOSIS — I5032 Chronic diastolic (congestive) heart failure: Secondary | ICD-10-CM | POA: Diagnosis not present

## 2020-04-14 DIAGNOSIS — I4821 Permanent atrial fibrillation: Secondary | ICD-10-CM | POA: Diagnosis not present

## 2020-04-14 DIAGNOSIS — G2581 Restless legs syndrome: Secondary | ICD-10-CM | POA: Diagnosis not present

## 2020-04-14 DIAGNOSIS — I2510 Atherosclerotic heart disease of native coronary artery without angina pectoris: Secondary | ICD-10-CM | POA: Diagnosis not present

## 2020-04-14 DIAGNOSIS — I110 Hypertensive heart disease with heart failure: Secondary | ICD-10-CM | POA: Diagnosis not present

## 2020-04-14 DIAGNOSIS — I739 Peripheral vascular disease, unspecified: Secondary | ICD-10-CM | POA: Diagnosis not present

## 2020-04-15 DIAGNOSIS — I5032 Chronic diastolic (congestive) heart failure: Secondary | ICD-10-CM | POA: Diagnosis not present

## 2020-04-15 DIAGNOSIS — I69322 Dysarthria following cerebral infarction: Secondary | ICD-10-CM | POA: Diagnosis not present

## 2020-04-15 DIAGNOSIS — E785 Hyperlipidemia, unspecified: Secondary | ICD-10-CM | POA: Diagnosis not present

## 2020-04-15 DIAGNOSIS — I4821 Permanent atrial fibrillation: Secondary | ICD-10-CM | POA: Diagnosis not present

## 2020-04-15 DIAGNOSIS — G2581 Restless legs syndrome: Secondary | ICD-10-CM | POA: Diagnosis not present

## 2020-04-15 DIAGNOSIS — I2510 Atherosclerotic heart disease of native coronary artery without angina pectoris: Secondary | ICD-10-CM | POA: Diagnosis not present

## 2020-04-15 DIAGNOSIS — I69351 Hemiplegia and hemiparesis following cerebral infarction affecting right dominant side: Secondary | ICD-10-CM | POA: Diagnosis not present

## 2020-04-15 DIAGNOSIS — I739 Peripheral vascular disease, unspecified: Secondary | ICD-10-CM | POA: Diagnosis not present

## 2020-04-15 DIAGNOSIS — I110 Hypertensive heart disease with heart failure: Secondary | ICD-10-CM | POA: Diagnosis not present

## 2020-04-16 DIAGNOSIS — I5032 Chronic diastolic (congestive) heart failure: Secondary | ICD-10-CM | POA: Diagnosis not present

## 2020-04-16 DIAGNOSIS — I69322 Dysarthria following cerebral infarction: Secondary | ICD-10-CM | POA: Diagnosis not present

## 2020-04-16 DIAGNOSIS — E785 Hyperlipidemia, unspecified: Secondary | ICD-10-CM | POA: Diagnosis not present

## 2020-04-16 DIAGNOSIS — I2510 Atherosclerotic heart disease of native coronary artery without angina pectoris: Secondary | ICD-10-CM | POA: Diagnosis not present

## 2020-04-16 DIAGNOSIS — I110 Hypertensive heart disease with heart failure: Secondary | ICD-10-CM | POA: Diagnosis not present

## 2020-04-16 DIAGNOSIS — I739 Peripheral vascular disease, unspecified: Secondary | ICD-10-CM | POA: Diagnosis not present

## 2020-04-16 DIAGNOSIS — I69351 Hemiplegia and hemiparesis following cerebral infarction affecting right dominant side: Secondary | ICD-10-CM | POA: Diagnosis not present

## 2020-04-16 DIAGNOSIS — G2581 Restless legs syndrome: Secondary | ICD-10-CM | POA: Diagnosis not present

## 2020-04-16 DIAGNOSIS — I4821 Permanent atrial fibrillation: Secondary | ICD-10-CM | POA: Diagnosis not present

## 2020-04-20 DIAGNOSIS — I5032 Chronic diastolic (congestive) heart failure: Secondary | ICD-10-CM | POA: Diagnosis not present

## 2020-04-20 DIAGNOSIS — I4821 Permanent atrial fibrillation: Secondary | ICD-10-CM | POA: Diagnosis not present

## 2020-04-20 DIAGNOSIS — G2581 Restless legs syndrome: Secondary | ICD-10-CM | POA: Diagnosis not present

## 2020-04-20 DIAGNOSIS — I739 Peripheral vascular disease, unspecified: Secondary | ICD-10-CM | POA: Diagnosis not present

## 2020-04-20 DIAGNOSIS — I2510 Atherosclerotic heart disease of native coronary artery without angina pectoris: Secondary | ICD-10-CM | POA: Diagnosis not present

## 2020-04-20 DIAGNOSIS — E785 Hyperlipidemia, unspecified: Secondary | ICD-10-CM | POA: Diagnosis not present

## 2020-04-20 DIAGNOSIS — I110 Hypertensive heart disease with heart failure: Secondary | ICD-10-CM | POA: Diagnosis not present

## 2020-04-20 DIAGNOSIS — I69322 Dysarthria following cerebral infarction: Secondary | ICD-10-CM | POA: Diagnosis not present

## 2020-04-20 DIAGNOSIS — I69351 Hemiplegia and hemiparesis following cerebral infarction affecting right dominant side: Secondary | ICD-10-CM | POA: Diagnosis not present

## 2020-04-21 DIAGNOSIS — G2581 Restless legs syndrome: Secondary | ICD-10-CM | POA: Diagnosis not present

## 2020-04-21 DIAGNOSIS — I4821 Permanent atrial fibrillation: Secondary | ICD-10-CM | POA: Diagnosis not present

## 2020-04-21 DIAGNOSIS — I110 Hypertensive heart disease with heart failure: Secondary | ICD-10-CM | POA: Diagnosis not present

## 2020-04-21 DIAGNOSIS — I5032 Chronic diastolic (congestive) heart failure: Secondary | ICD-10-CM | POA: Diagnosis not present

## 2020-04-21 DIAGNOSIS — I739 Peripheral vascular disease, unspecified: Secondary | ICD-10-CM | POA: Diagnosis not present

## 2020-04-21 DIAGNOSIS — I2510 Atherosclerotic heart disease of native coronary artery without angina pectoris: Secondary | ICD-10-CM | POA: Diagnosis not present

## 2020-04-21 DIAGNOSIS — I69322 Dysarthria following cerebral infarction: Secondary | ICD-10-CM | POA: Diagnosis not present

## 2020-04-21 DIAGNOSIS — E785 Hyperlipidemia, unspecified: Secondary | ICD-10-CM | POA: Diagnosis not present

## 2020-04-21 DIAGNOSIS — I69351 Hemiplegia and hemiparesis following cerebral infarction affecting right dominant side: Secondary | ICD-10-CM | POA: Diagnosis not present

## 2020-04-22 DIAGNOSIS — I69322 Dysarthria following cerebral infarction: Secondary | ICD-10-CM | POA: Diagnosis not present

## 2020-04-22 DIAGNOSIS — I5032 Chronic diastolic (congestive) heart failure: Secondary | ICD-10-CM | POA: Diagnosis not present

## 2020-04-22 DIAGNOSIS — G2581 Restless legs syndrome: Secondary | ICD-10-CM | POA: Diagnosis not present

## 2020-04-22 DIAGNOSIS — I2510 Atherosclerotic heart disease of native coronary artery without angina pectoris: Secondary | ICD-10-CM | POA: Diagnosis not present

## 2020-04-22 DIAGNOSIS — I110 Hypertensive heart disease with heart failure: Secondary | ICD-10-CM | POA: Diagnosis not present

## 2020-04-22 DIAGNOSIS — I739 Peripheral vascular disease, unspecified: Secondary | ICD-10-CM | POA: Diagnosis not present

## 2020-04-22 DIAGNOSIS — I4821 Permanent atrial fibrillation: Secondary | ICD-10-CM | POA: Diagnosis not present

## 2020-04-22 DIAGNOSIS — I69351 Hemiplegia and hemiparesis following cerebral infarction affecting right dominant side: Secondary | ICD-10-CM | POA: Diagnosis not present

## 2020-04-22 DIAGNOSIS — E785 Hyperlipidemia, unspecified: Secondary | ICD-10-CM | POA: Diagnosis not present

## 2020-04-23 ENCOUNTER — Other Ambulatory Visit: Payer: Self-pay | Admitting: Cardiovascular Disease

## 2020-04-23 DIAGNOSIS — G2581 Restless legs syndrome: Secondary | ICD-10-CM | POA: Diagnosis not present

## 2020-04-23 DIAGNOSIS — I739 Peripheral vascular disease, unspecified: Secondary | ICD-10-CM | POA: Diagnosis not present

## 2020-04-23 DIAGNOSIS — I69351 Hemiplegia and hemiparesis following cerebral infarction affecting right dominant side: Secondary | ICD-10-CM | POA: Diagnosis not present

## 2020-04-23 DIAGNOSIS — E785 Hyperlipidemia, unspecified: Secondary | ICD-10-CM | POA: Diagnosis not present

## 2020-04-23 DIAGNOSIS — I4821 Permanent atrial fibrillation: Secondary | ICD-10-CM | POA: Diagnosis not present

## 2020-04-23 DIAGNOSIS — I2510 Atherosclerotic heart disease of native coronary artery without angina pectoris: Secondary | ICD-10-CM | POA: Diagnosis not present

## 2020-04-23 DIAGNOSIS — I69322 Dysarthria following cerebral infarction: Secondary | ICD-10-CM | POA: Diagnosis not present

## 2020-04-23 DIAGNOSIS — I110 Hypertensive heart disease with heart failure: Secondary | ICD-10-CM | POA: Diagnosis not present

## 2020-04-23 DIAGNOSIS — I5032 Chronic diastolic (congestive) heart failure: Secondary | ICD-10-CM | POA: Diagnosis not present

## 2020-04-26 DIAGNOSIS — I5032 Chronic diastolic (congestive) heart failure: Secondary | ICD-10-CM | POA: Diagnosis not present

## 2020-04-26 DIAGNOSIS — I110 Hypertensive heart disease with heart failure: Secondary | ICD-10-CM | POA: Diagnosis not present

## 2020-04-26 DIAGNOSIS — I4821 Permanent atrial fibrillation: Secondary | ICD-10-CM | POA: Diagnosis not present

## 2020-04-26 DIAGNOSIS — E785 Hyperlipidemia, unspecified: Secondary | ICD-10-CM | POA: Diagnosis not present

## 2020-04-26 DIAGNOSIS — I69322 Dysarthria following cerebral infarction: Secondary | ICD-10-CM | POA: Diagnosis not present

## 2020-04-26 DIAGNOSIS — I2510 Atherosclerotic heart disease of native coronary artery without angina pectoris: Secondary | ICD-10-CM | POA: Diagnosis not present

## 2020-04-26 DIAGNOSIS — I739 Peripheral vascular disease, unspecified: Secondary | ICD-10-CM | POA: Diagnosis not present

## 2020-04-26 DIAGNOSIS — G2581 Restless legs syndrome: Secondary | ICD-10-CM | POA: Diagnosis not present

## 2020-04-26 DIAGNOSIS — I69351 Hemiplegia and hemiparesis following cerebral infarction affecting right dominant side: Secondary | ICD-10-CM | POA: Diagnosis not present

## 2020-04-27 ENCOUNTER — Other Ambulatory Visit: Payer: Self-pay | Admitting: Cardiovascular Disease

## 2020-04-27 DIAGNOSIS — I4821 Permanent atrial fibrillation: Secondary | ICD-10-CM | POA: Diagnosis not present

## 2020-04-27 DIAGNOSIS — G2581 Restless legs syndrome: Secondary | ICD-10-CM | POA: Diagnosis not present

## 2020-04-27 DIAGNOSIS — I2510 Atherosclerotic heart disease of native coronary artery without angina pectoris: Secondary | ICD-10-CM | POA: Diagnosis not present

## 2020-04-27 DIAGNOSIS — I5032 Chronic diastolic (congestive) heart failure: Secondary | ICD-10-CM | POA: Diagnosis not present

## 2020-04-27 DIAGNOSIS — I739 Peripheral vascular disease, unspecified: Secondary | ICD-10-CM | POA: Diagnosis not present

## 2020-04-27 DIAGNOSIS — I69351 Hemiplegia and hemiparesis following cerebral infarction affecting right dominant side: Secondary | ICD-10-CM | POA: Diagnosis not present

## 2020-04-27 DIAGNOSIS — I110 Hypertensive heart disease with heart failure: Secondary | ICD-10-CM | POA: Diagnosis not present

## 2020-04-27 DIAGNOSIS — I69322 Dysarthria following cerebral infarction: Secondary | ICD-10-CM | POA: Diagnosis not present

## 2020-04-27 DIAGNOSIS — E785 Hyperlipidemia, unspecified: Secondary | ICD-10-CM | POA: Diagnosis not present

## 2020-04-28 ENCOUNTER — Other Ambulatory Visit: Payer: Self-pay | Admitting: Cardiology

## 2020-04-28 NOTE — Telephone Encounter (Signed)
   *  STAT* If patient is at the pharmacy, call can be transferred to refill team.   1. Which medications need to be refilled? (please list name of each medication and dose if known) potassium chloride (KLOR-CON) 20 MEQ packet  2. Which pharmacy/location (including street and city if local pharmacy) is medication to be sent to? [place], [place]  3. Do they need a 30 day or 90 day supply? 90 days

## 2020-04-29 ENCOUNTER — Other Ambulatory Visit: Payer: Self-pay | Admitting: Cardiovascular Disease

## 2020-04-29 MED ORDER — POTASSIUM CHLORIDE 20 MEQ PO PACK: 20.0000 meq | PACK | Freq: Every day | ORAL | Status: AC

## 2020-04-30 DIAGNOSIS — I110 Hypertensive heart disease with heart failure: Secondary | ICD-10-CM | POA: Diagnosis not present

## 2020-04-30 DIAGNOSIS — I69322 Dysarthria following cerebral infarction: Secondary | ICD-10-CM | POA: Diagnosis not present

## 2020-04-30 DIAGNOSIS — I69351 Hemiplegia and hemiparesis following cerebral infarction affecting right dominant side: Secondary | ICD-10-CM | POA: Diagnosis not present

## 2020-04-30 DIAGNOSIS — E785 Hyperlipidemia, unspecified: Secondary | ICD-10-CM | POA: Diagnosis not present

## 2020-04-30 DIAGNOSIS — I4821 Permanent atrial fibrillation: Secondary | ICD-10-CM | POA: Diagnosis not present

## 2020-04-30 DIAGNOSIS — G2581 Restless legs syndrome: Secondary | ICD-10-CM | POA: Diagnosis not present

## 2020-04-30 DIAGNOSIS — I2510 Atherosclerotic heart disease of native coronary artery without angina pectoris: Secondary | ICD-10-CM | POA: Diagnosis not present

## 2020-04-30 DIAGNOSIS — I739 Peripheral vascular disease, unspecified: Secondary | ICD-10-CM | POA: Diagnosis not present

## 2020-04-30 DIAGNOSIS — I5032 Chronic diastolic (congestive) heart failure: Secondary | ICD-10-CM | POA: Diagnosis not present

## 2020-05-03 DIAGNOSIS — I110 Hypertensive heart disease with heart failure: Secondary | ICD-10-CM | POA: Diagnosis not present

## 2020-05-03 DIAGNOSIS — I4821 Permanent atrial fibrillation: Secondary | ICD-10-CM | POA: Diagnosis not present

## 2020-05-03 DIAGNOSIS — I2510 Atherosclerotic heart disease of native coronary artery without angina pectoris: Secondary | ICD-10-CM | POA: Diagnosis not present

## 2020-05-03 DIAGNOSIS — E785 Hyperlipidemia, unspecified: Secondary | ICD-10-CM | POA: Diagnosis not present

## 2020-05-03 DIAGNOSIS — I739 Peripheral vascular disease, unspecified: Secondary | ICD-10-CM | POA: Diagnosis not present

## 2020-05-03 DIAGNOSIS — I5032 Chronic diastolic (congestive) heart failure: Secondary | ICD-10-CM | POA: Diagnosis not present

## 2020-05-03 DIAGNOSIS — I69351 Hemiplegia and hemiparesis following cerebral infarction affecting right dominant side: Secondary | ICD-10-CM | POA: Diagnosis not present

## 2020-05-03 DIAGNOSIS — G2581 Restless legs syndrome: Secondary | ICD-10-CM | POA: Diagnosis not present

## 2020-05-03 DIAGNOSIS — I69322 Dysarthria following cerebral infarction: Secondary | ICD-10-CM | POA: Diagnosis not present

## 2020-05-04 DIAGNOSIS — G2581 Restless legs syndrome: Secondary | ICD-10-CM | POA: Diagnosis not present

## 2020-05-04 DIAGNOSIS — E785 Hyperlipidemia, unspecified: Secondary | ICD-10-CM | POA: Diagnosis not present

## 2020-05-04 DIAGNOSIS — I4821 Permanent atrial fibrillation: Secondary | ICD-10-CM | POA: Diagnosis not present

## 2020-05-04 DIAGNOSIS — I2510 Atherosclerotic heart disease of native coronary artery without angina pectoris: Secondary | ICD-10-CM | POA: Diagnosis not present

## 2020-05-04 DIAGNOSIS — I69351 Hemiplegia and hemiparesis following cerebral infarction affecting right dominant side: Secondary | ICD-10-CM | POA: Diagnosis not present

## 2020-05-04 DIAGNOSIS — I110 Hypertensive heart disease with heart failure: Secondary | ICD-10-CM | POA: Diagnosis not present

## 2020-05-04 DIAGNOSIS — I69322 Dysarthria following cerebral infarction: Secondary | ICD-10-CM | POA: Diagnosis not present

## 2020-05-04 DIAGNOSIS — I739 Peripheral vascular disease, unspecified: Secondary | ICD-10-CM | POA: Diagnosis not present

## 2020-05-04 DIAGNOSIS — I5032 Chronic diastolic (congestive) heart failure: Secondary | ICD-10-CM | POA: Diagnosis not present

## 2020-05-05 ENCOUNTER — Ambulatory Visit: Payer: Medicare HMO | Admitting: Adult Health

## 2020-05-07 DIAGNOSIS — I2510 Atherosclerotic heart disease of native coronary artery without angina pectoris: Secondary | ICD-10-CM | POA: Diagnosis not present

## 2020-05-07 DIAGNOSIS — E785 Hyperlipidemia, unspecified: Secondary | ICD-10-CM | POA: Diagnosis not present

## 2020-05-07 DIAGNOSIS — I69351 Hemiplegia and hemiparesis following cerebral infarction affecting right dominant side: Secondary | ICD-10-CM | POA: Diagnosis not present

## 2020-05-07 DIAGNOSIS — G2581 Restless legs syndrome: Secondary | ICD-10-CM | POA: Diagnosis not present

## 2020-05-07 DIAGNOSIS — I739 Peripheral vascular disease, unspecified: Secondary | ICD-10-CM | POA: Diagnosis not present

## 2020-05-07 DIAGNOSIS — I5032 Chronic diastolic (congestive) heart failure: Secondary | ICD-10-CM | POA: Diagnosis not present

## 2020-05-07 DIAGNOSIS — I4821 Permanent atrial fibrillation: Secondary | ICD-10-CM | POA: Diagnosis not present

## 2020-05-07 DIAGNOSIS — I110 Hypertensive heart disease with heart failure: Secondary | ICD-10-CM | POA: Diagnosis not present

## 2020-05-07 DIAGNOSIS — I69322 Dysarthria following cerebral infarction: Secondary | ICD-10-CM | POA: Diagnosis not present

## 2020-05-10 DIAGNOSIS — I69322 Dysarthria following cerebral infarction: Secondary | ICD-10-CM | POA: Diagnosis not present

## 2020-05-10 DIAGNOSIS — I2510 Atherosclerotic heart disease of native coronary artery without angina pectoris: Secondary | ICD-10-CM | POA: Diagnosis not present

## 2020-05-10 DIAGNOSIS — I69351 Hemiplegia and hemiparesis following cerebral infarction affecting right dominant side: Secondary | ICD-10-CM | POA: Diagnosis not present

## 2020-05-10 DIAGNOSIS — I4821 Permanent atrial fibrillation: Secondary | ICD-10-CM | POA: Diagnosis not present

## 2020-05-10 DIAGNOSIS — E785 Hyperlipidemia, unspecified: Secondary | ICD-10-CM | POA: Diagnosis not present

## 2020-05-10 DIAGNOSIS — I110 Hypertensive heart disease with heart failure: Secondary | ICD-10-CM | POA: Diagnosis not present

## 2020-05-10 DIAGNOSIS — I739 Peripheral vascular disease, unspecified: Secondary | ICD-10-CM | POA: Diagnosis not present

## 2020-05-10 DIAGNOSIS — I5032 Chronic diastolic (congestive) heart failure: Secondary | ICD-10-CM | POA: Diagnosis not present

## 2020-05-10 DIAGNOSIS — G2581 Restless legs syndrome: Secondary | ICD-10-CM | POA: Diagnosis not present

## 2020-05-11 DIAGNOSIS — E785 Hyperlipidemia, unspecified: Secondary | ICD-10-CM | POA: Diagnosis not present

## 2020-05-11 DIAGNOSIS — I2510 Atherosclerotic heart disease of native coronary artery without angina pectoris: Secondary | ICD-10-CM | POA: Diagnosis not present

## 2020-05-11 DIAGNOSIS — I739 Peripheral vascular disease, unspecified: Secondary | ICD-10-CM | POA: Diagnosis not present

## 2020-05-11 DIAGNOSIS — I69322 Dysarthria following cerebral infarction: Secondary | ICD-10-CM | POA: Diagnosis not present

## 2020-05-11 DIAGNOSIS — I5032 Chronic diastolic (congestive) heart failure: Secondary | ICD-10-CM | POA: Diagnosis not present

## 2020-05-11 DIAGNOSIS — I4821 Permanent atrial fibrillation: Secondary | ICD-10-CM | POA: Diagnosis not present

## 2020-05-11 DIAGNOSIS — G2581 Restless legs syndrome: Secondary | ICD-10-CM | POA: Diagnosis not present

## 2020-05-11 DIAGNOSIS — I69351 Hemiplegia and hemiparesis following cerebral infarction affecting right dominant side: Secondary | ICD-10-CM | POA: Diagnosis not present

## 2020-05-11 DIAGNOSIS — I110 Hypertensive heart disease with heart failure: Secondary | ICD-10-CM | POA: Diagnosis not present

## 2020-05-12 DIAGNOSIS — I2510 Atherosclerotic heart disease of native coronary artery without angina pectoris: Secondary | ICD-10-CM | POA: Diagnosis not present

## 2020-05-12 DIAGNOSIS — I69351 Hemiplegia and hemiparesis following cerebral infarction affecting right dominant side: Secondary | ICD-10-CM | POA: Diagnosis not present

## 2020-05-12 DIAGNOSIS — I110 Hypertensive heart disease with heart failure: Secondary | ICD-10-CM | POA: Diagnosis not present

## 2020-05-12 DIAGNOSIS — I4821 Permanent atrial fibrillation: Secondary | ICD-10-CM | POA: Diagnosis not present

## 2020-05-12 DIAGNOSIS — I5032 Chronic diastolic (congestive) heart failure: Secondary | ICD-10-CM | POA: Diagnosis not present

## 2020-05-12 DIAGNOSIS — G2581 Restless legs syndrome: Secondary | ICD-10-CM | POA: Diagnosis not present

## 2020-05-12 DIAGNOSIS — I739 Peripheral vascular disease, unspecified: Secondary | ICD-10-CM | POA: Diagnosis not present

## 2020-05-12 DIAGNOSIS — E785 Hyperlipidemia, unspecified: Secondary | ICD-10-CM | POA: Diagnosis not present

## 2020-05-12 DIAGNOSIS — I69322 Dysarthria following cerebral infarction: Secondary | ICD-10-CM | POA: Diagnosis not present

## 2020-05-17 DIAGNOSIS — I5032 Chronic diastolic (congestive) heart failure: Secondary | ICD-10-CM | POA: Diagnosis not present

## 2020-05-17 DIAGNOSIS — G2581 Restless legs syndrome: Secondary | ICD-10-CM | POA: Diagnosis not present

## 2020-05-17 DIAGNOSIS — I739 Peripheral vascular disease, unspecified: Secondary | ICD-10-CM | POA: Diagnosis not present

## 2020-05-17 DIAGNOSIS — I69351 Hemiplegia and hemiparesis following cerebral infarction affecting right dominant side: Secondary | ICD-10-CM | POA: Diagnosis not present

## 2020-05-17 DIAGNOSIS — I69322 Dysarthria following cerebral infarction: Secondary | ICD-10-CM | POA: Diagnosis not present

## 2020-05-17 DIAGNOSIS — I2510 Atherosclerotic heart disease of native coronary artery without angina pectoris: Secondary | ICD-10-CM | POA: Diagnosis not present

## 2020-05-17 DIAGNOSIS — E785 Hyperlipidemia, unspecified: Secondary | ICD-10-CM | POA: Diagnosis not present

## 2020-05-17 DIAGNOSIS — I4821 Permanent atrial fibrillation: Secondary | ICD-10-CM | POA: Diagnosis not present

## 2020-05-17 DIAGNOSIS — I110 Hypertensive heart disease with heart failure: Secondary | ICD-10-CM | POA: Diagnosis not present

## 2020-05-18 DIAGNOSIS — I4891 Unspecified atrial fibrillation: Secondary | ICD-10-CM | POA: Diagnosis not present

## 2020-05-18 DIAGNOSIS — R0602 Shortness of breath: Secondary | ICD-10-CM | POA: Diagnosis not present

## 2020-05-18 DIAGNOSIS — Z6829 Body mass index (BMI) 29.0-29.9, adult: Secondary | ICD-10-CM | POA: Diagnosis not present

## 2020-05-18 DIAGNOSIS — R262 Difficulty in walking, not elsewhere classified: Secondary | ICD-10-CM | POA: Diagnosis not present

## 2020-05-18 DIAGNOSIS — J8489 Other specified interstitial pulmonary diseases: Secondary | ICD-10-CM | POA: Diagnosis not present

## 2020-05-19 DIAGNOSIS — G2581 Restless legs syndrome: Secondary | ICD-10-CM | POA: Diagnosis not present

## 2020-05-19 DIAGNOSIS — I4821 Permanent atrial fibrillation: Secondary | ICD-10-CM | POA: Diagnosis not present

## 2020-05-19 DIAGNOSIS — I2510 Atherosclerotic heart disease of native coronary artery without angina pectoris: Secondary | ICD-10-CM | POA: Diagnosis not present

## 2020-05-19 DIAGNOSIS — I110 Hypertensive heart disease with heart failure: Secondary | ICD-10-CM | POA: Diagnosis not present

## 2020-05-19 DIAGNOSIS — I739 Peripheral vascular disease, unspecified: Secondary | ICD-10-CM | POA: Diagnosis not present

## 2020-05-19 DIAGNOSIS — I69351 Hemiplegia and hemiparesis following cerebral infarction affecting right dominant side: Secondary | ICD-10-CM | POA: Diagnosis not present

## 2020-05-19 DIAGNOSIS — I5032 Chronic diastolic (congestive) heart failure: Secondary | ICD-10-CM | POA: Diagnosis not present

## 2020-05-19 DIAGNOSIS — I69322 Dysarthria following cerebral infarction: Secondary | ICD-10-CM | POA: Diagnosis not present

## 2020-05-19 DIAGNOSIS — E785 Hyperlipidemia, unspecified: Secondary | ICD-10-CM | POA: Diagnosis not present

## 2020-05-20 DIAGNOSIS — I110 Hypertensive heart disease with heart failure: Secondary | ICD-10-CM | POA: Diagnosis not present

## 2020-05-20 DIAGNOSIS — I4821 Permanent atrial fibrillation: Secondary | ICD-10-CM | POA: Diagnosis not present

## 2020-05-20 DIAGNOSIS — G2581 Restless legs syndrome: Secondary | ICD-10-CM | POA: Diagnosis not present

## 2020-05-20 DIAGNOSIS — I69322 Dysarthria following cerebral infarction: Secondary | ICD-10-CM | POA: Diagnosis not present

## 2020-05-20 DIAGNOSIS — I739 Peripheral vascular disease, unspecified: Secondary | ICD-10-CM | POA: Diagnosis not present

## 2020-05-20 DIAGNOSIS — I69351 Hemiplegia and hemiparesis following cerebral infarction affecting right dominant side: Secondary | ICD-10-CM | POA: Diagnosis not present

## 2020-05-20 DIAGNOSIS — I5032 Chronic diastolic (congestive) heart failure: Secondary | ICD-10-CM | POA: Diagnosis not present

## 2020-05-20 DIAGNOSIS — E785 Hyperlipidemia, unspecified: Secondary | ICD-10-CM | POA: Diagnosis not present

## 2020-05-20 DIAGNOSIS — I2510 Atherosclerotic heart disease of native coronary artery without angina pectoris: Secondary | ICD-10-CM | POA: Diagnosis not present

## 2020-05-24 DIAGNOSIS — G4733 Obstructive sleep apnea (adult) (pediatric): Secondary | ICD-10-CM | POA: Diagnosis not present

## 2020-06-25 DIAGNOSIS — F411 Generalized anxiety disorder: Secondary | ICD-10-CM | POA: Diagnosis not present

## 2020-06-25 DIAGNOSIS — I679 Cerebrovascular disease, unspecified: Secondary | ICD-10-CM | POA: Diagnosis not present

## 2020-06-25 DIAGNOSIS — I5032 Chronic diastolic (congestive) heart failure: Secondary | ICD-10-CM | POA: Diagnosis not present

## 2020-06-25 DIAGNOSIS — I4891 Unspecified atrial fibrillation: Secondary | ICD-10-CM | POA: Diagnosis not present

## 2020-06-25 DIAGNOSIS — R262 Difficulty in walking, not elsewhere classified: Secondary | ICD-10-CM | POA: Diagnosis not present

## 2020-06-25 DIAGNOSIS — R200 Anesthesia of skin: Secondary | ICD-10-CM | POA: Diagnosis not present

## 2020-06-25 DIAGNOSIS — K219 Gastro-esophageal reflux disease without esophagitis: Secondary | ICD-10-CM | POA: Diagnosis not present

## 2020-07-01 DIAGNOSIS — I739 Peripheral vascular disease, unspecified: Secondary | ICD-10-CM | POA: Diagnosis not present

## 2020-07-01 DIAGNOSIS — K449 Diaphragmatic hernia without obstruction or gangrene: Secondary | ICD-10-CM | POA: Diagnosis not present

## 2020-07-01 DIAGNOSIS — K219 Gastro-esophageal reflux disease without esophagitis: Secondary | ICD-10-CM | POA: Diagnosis not present

## 2020-07-01 DIAGNOSIS — I69351 Hemiplegia and hemiparesis following cerebral infarction affecting right dominant side: Secondary | ICD-10-CM | POA: Diagnosis not present

## 2020-07-01 DIAGNOSIS — F410 Panic disorder [episodic paroxysmal anxiety] without agoraphobia: Secondary | ICD-10-CM | POA: Diagnosis not present

## 2020-07-01 DIAGNOSIS — I110 Hypertensive heart disease with heart failure: Secondary | ICD-10-CM | POA: Diagnosis not present

## 2020-07-01 DIAGNOSIS — I69318 Other symptoms and signs involving cognitive functions following cerebral infarction: Secondary | ICD-10-CM | POA: Diagnosis not present

## 2020-07-01 DIAGNOSIS — I5032 Chronic diastolic (congestive) heart failure: Secondary | ICD-10-CM | POA: Diagnosis not present

## 2020-07-01 DIAGNOSIS — I4891 Unspecified atrial fibrillation: Secondary | ICD-10-CM | POA: Diagnosis not present

## 2020-07-02 ENCOUNTER — Other Ambulatory Visit: Payer: Self-pay | Admitting: Cardiovascular Disease

## 2020-07-05 DIAGNOSIS — I110 Hypertensive heart disease with heart failure: Secondary | ICD-10-CM | POA: Diagnosis not present

## 2020-07-05 DIAGNOSIS — I739 Peripheral vascular disease, unspecified: Secondary | ICD-10-CM | POA: Diagnosis not present

## 2020-07-05 DIAGNOSIS — I69351 Hemiplegia and hemiparesis following cerebral infarction affecting right dominant side: Secondary | ICD-10-CM | POA: Diagnosis not present

## 2020-07-05 DIAGNOSIS — K219 Gastro-esophageal reflux disease without esophagitis: Secondary | ICD-10-CM | POA: Diagnosis not present

## 2020-07-05 DIAGNOSIS — I5032 Chronic diastolic (congestive) heart failure: Secondary | ICD-10-CM | POA: Diagnosis not present

## 2020-07-05 DIAGNOSIS — I69318 Other symptoms and signs involving cognitive functions following cerebral infarction: Secondary | ICD-10-CM | POA: Diagnosis not present

## 2020-07-05 DIAGNOSIS — I4891 Unspecified atrial fibrillation: Secondary | ICD-10-CM | POA: Diagnosis not present

## 2020-07-05 DIAGNOSIS — F410 Panic disorder [episodic paroxysmal anxiety] without agoraphobia: Secondary | ICD-10-CM | POA: Diagnosis not present

## 2020-07-05 DIAGNOSIS — K449 Diaphragmatic hernia without obstruction or gangrene: Secondary | ICD-10-CM | POA: Diagnosis not present

## 2020-07-12 DIAGNOSIS — K219 Gastro-esophageal reflux disease without esophagitis: Secondary | ICD-10-CM | POA: Diagnosis not present

## 2020-07-12 DIAGNOSIS — I110 Hypertensive heart disease with heart failure: Secondary | ICD-10-CM | POA: Diagnosis not present

## 2020-07-12 DIAGNOSIS — I5032 Chronic diastolic (congestive) heart failure: Secondary | ICD-10-CM | POA: Diagnosis not present

## 2020-07-12 DIAGNOSIS — I69351 Hemiplegia and hemiparesis following cerebral infarction affecting right dominant side: Secondary | ICD-10-CM | POA: Diagnosis not present

## 2020-07-12 DIAGNOSIS — K449 Diaphragmatic hernia without obstruction or gangrene: Secondary | ICD-10-CM | POA: Diagnosis not present

## 2020-07-12 DIAGNOSIS — I69318 Other symptoms and signs involving cognitive functions following cerebral infarction: Secondary | ICD-10-CM | POA: Diagnosis not present

## 2020-07-12 DIAGNOSIS — I4891 Unspecified atrial fibrillation: Secondary | ICD-10-CM | POA: Diagnosis not present

## 2020-07-12 DIAGNOSIS — F410 Panic disorder [episodic paroxysmal anxiety] without agoraphobia: Secondary | ICD-10-CM | POA: Diagnosis not present

## 2020-07-12 DIAGNOSIS — I739 Peripheral vascular disease, unspecified: Secondary | ICD-10-CM | POA: Diagnosis not present

## 2020-07-14 DIAGNOSIS — K449 Diaphragmatic hernia without obstruction or gangrene: Secondary | ICD-10-CM | POA: Diagnosis not present

## 2020-07-14 DIAGNOSIS — I69318 Other symptoms and signs involving cognitive functions following cerebral infarction: Secondary | ICD-10-CM | POA: Diagnosis not present

## 2020-07-14 DIAGNOSIS — K219 Gastro-esophageal reflux disease without esophagitis: Secondary | ICD-10-CM | POA: Diagnosis not present

## 2020-07-14 DIAGNOSIS — I69351 Hemiplegia and hemiparesis following cerebral infarction affecting right dominant side: Secondary | ICD-10-CM | POA: Diagnosis not present

## 2020-07-14 DIAGNOSIS — I110 Hypertensive heart disease with heart failure: Secondary | ICD-10-CM | POA: Diagnosis not present

## 2020-07-14 DIAGNOSIS — F410 Panic disorder [episodic paroxysmal anxiety] without agoraphobia: Secondary | ICD-10-CM | POA: Diagnosis not present

## 2020-07-14 DIAGNOSIS — I4891 Unspecified atrial fibrillation: Secondary | ICD-10-CM | POA: Diagnosis not present

## 2020-07-14 DIAGNOSIS — I5032 Chronic diastolic (congestive) heart failure: Secondary | ICD-10-CM | POA: Diagnosis not present

## 2020-07-14 DIAGNOSIS — I739 Peripheral vascular disease, unspecified: Secondary | ICD-10-CM | POA: Diagnosis not present

## 2020-07-20 DIAGNOSIS — K449 Diaphragmatic hernia without obstruction or gangrene: Secondary | ICD-10-CM | POA: Diagnosis not present

## 2020-07-20 DIAGNOSIS — K219 Gastro-esophageal reflux disease without esophagitis: Secondary | ICD-10-CM | POA: Diagnosis not present

## 2020-07-20 DIAGNOSIS — I4891 Unspecified atrial fibrillation: Secondary | ICD-10-CM | POA: Diagnosis not present

## 2020-07-20 DIAGNOSIS — I5032 Chronic diastolic (congestive) heart failure: Secondary | ICD-10-CM | POA: Diagnosis not present

## 2020-07-20 DIAGNOSIS — I110 Hypertensive heart disease with heart failure: Secondary | ICD-10-CM | POA: Diagnosis not present

## 2020-07-20 DIAGNOSIS — F410 Panic disorder [episodic paroxysmal anxiety] without agoraphobia: Secondary | ICD-10-CM | POA: Diagnosis not present

## 2020-07-20 DIAGNOSIS — I69351 Hemiplegia and hemiparesis following cerebral infarction affecting right dominant side: Secondary | ICD-10-CM | POA: Diagnosis not present

## 2020-07-20 DIAGNOSIS — I739 Peripheral vascular disease, unspecified: Secondary | ICD-10-CM | POA: Diagnosis not present

## 2020-07-20 DIAGNOSIS — I69318 Other symptoms and signs involving cognitive functions following cerebral infarction: Secondary | ICD-10-CM | POA: Diagnosis not present

## 2020-07-21 ENCOUNTER — Telehealth: Payer: Self-pay | Admitting: Cardiology

## 2020-07-21 DIAGNOSIS — I5032 Chronic diastolic (congestive) heart failure: Secondary | ICD-10-CM

## 2020-07-21 DIAGNOSIS — Z79899 Other long term (current) drug therapy: Secondary | ICD-10-CM

## 2020-07-21 NOTE — Telephone Encounter (Signed)
This puts me in a bit of a difficult situation, I have absolutely no idea what she is taking care, since she was in [facility] nursing facility.  For certain, I think she should increase her Lasix to 80 mg daily till I see her back.  She needs to have chemistry panel done when she comes to see me.  Glenetta Hew, MD

## 2020-07-21 NOTE — Telephone Encounter (Signed)
STAT if patient feels like he/she is going to faint   1) Are you dizzy now? No.  2) Do you feel faint or have you passed out? No.  3) Do you have any other symptoms? Nightmares and SOB.  4) Have you checked your HR and BP (record if available)?     Pt c/o swelling: STAT is pt has developed SOB within 24 hours  1) How much weight have you gained and in what time span? States that she cannot stand to weight herself.  2) If swelling, where is the swelling located? Both ankles.  3) Are you currently taking a fluid pill? Yes.  4) Are you currently SOB? Yes.  5) Do you have a log of your daily weights (if so, list)? No.  6) Have you gained 3 pounds in a day or 5 pounds in a week? No.  7) Have you traveled recently? No.   Patient states that she keeps having dizziness along with SOB and nightmares. She would like to speak to Dr. Ellyn Hack or his nurse about this matter. Please advise.

## 2020-07-21 NOTE — Telephone Encounter (Signed)
Patient reports since 03/22/2020 after returning home from the nursing home she has had SOB and nightmares. She has had edema in her legs which is worsened by the fact that she cannot elevate her legs in her wheelchair and she can't walk. She stays short of breath most of the time but it worsens at night and she props herself up with extra pillows. She states her legs hurt too ever since having stents placed. She reports they are not red, streaky or hot to the touch. Patient does not have a daily weight. She reports Home Health stopped weighing her a week ago when she fell. Patient states she is on Lasix 40mg  daily, not on medication list. She reports the nursing home decreased this from 80mg  daily and she's had problems ever since. Appointment made for 07/26/20 with Dr. Ellyn Hack. Patient may need transportation to this appointment as she does not drive. She is going to ask her nephew first but message will be sent to [place], [place] as well for follow up. Will route to MD and primary RN for recommendations.

## 2020-07-22 ENCOUNTER — Telehealth: Payer: Self-pay | Admitting: Licensed Clinical Social Worker

## 2020-07-22 NOTE — Telephone Encounter (Signed)
Spoke with patient and relayed Dr. Allison Quarry recommendations. BMP order placed. Patient will take 80mg  of Lasix until she see's Dr. Ellyn Hack on Monday. Patient advised to keep appointment with Dr. Ellyn Hack and she states she sure will.

## 2020-07-22 NOTE — Progress Notes (Signed)
Heart and Vascular Care Navigation  07/22/2020  Sara [name] May 25, 1926 [id number]  Reason for Referral:  Transportation concerns                                                                                                  Assessment:                  LCSW f/u with pt regarding potential transportation needs. Pt answered phone at [phone]. Introduced self, role, reason for call. Pt confirmed home address in [place], as well as her son Sara [name]'s contact. Pt confirms insurance coverage and pharmacy. She denies any issues with obtaining/affording medications, her son generally picks them up from the pharmacy.    Pt has access to working phone during the day. She lives with her son and as he is a former CNA he is able to assist her with most of her IADL/ADLs including bathing as needed. She had a stay at [facility] following a CVA and after a 100 day stay returned home. She has had multiple home health providers currently [facility] is coming by. She and her son had hired private caregivers to be with her during the day but they have been unsatisfied with those providers and does not have anyone consistently coming to assist her during the day.   Her son works in [place] and generally if she needs a ride she has been getting assistance from her nephew. She confirms her nephew can take her to the appointment on 2/7 to see Dr. Ellyn Hack. She understands that as I am a Education officer, museum I cannot advise her on medications but that she and her son should write down any concerns and bring them along with her current medications to her appointment on Monday. Pt is interested in the [facility] if needed in the future. I shared I could come and speak with her and her son regarding those services during their appointment on Monday.   No additional financial concerns currently per pt report. All questions answered, pt aware she can call this writer back if any additional questions  related to our conversation.   HRT/VAS Care Coordination    Patients Home Cardiology Office [place] Team Social Worker   Social Worker Name: Westley Hummer, [place], [phone], [facility]   Living arrangements for the past 2 months Single Family Home   Lives with: Adult Children   Patient Current Insurance Coverage Managed Medicare   Patient Has Concern With [place] No   Does Patient Have Prescription Coverage? Yes   Home Assistive Devices/Equipment Wheelchair; Environmental consultant (specify type)   DME Agency NA   [facility]   Current home services DME      Social History:  SDOH Screenings   Alcohol Screen: Not on file  Depression (QMV7-8): Not on file  Financial Resource Strain: Low Risk   . Difficulty of Paying Living Expenses: Not very hard  Food Insecurity: No Food Insecurity  . Worried About Charity fundraiser in the Last Year: Never true  . Ran Out of Food in the Last Year: Never true  Housing: Low Risk   . Last Housing Risk Score: 0  Physical Activity: Not on file  Social Connections: Not on file  Stress: Not on file  Tobacco Use: Low Risk   . Smoking Tobacco Use: Never Smoker  . Smokeless Tobacco Use: Never Used  Transportation Needs: No Transportation Needs  . Lack of Transportation (Medical): No  . Lack of Transportation (Non-Medical): No    SDOH Interventions: Financial Resources:  Sales promotion account executive Interventions: Intervention Not Indicated  Food Insecurity:  Food Insecurity Interventions: Intervention Not Indicated  Housing Insecurity:  Housing Interventions: Intervention Not Indicated  Transportation:   Transportation Interventions: Financial planner   Follow-up plan:   LCSW will prepare waiver form and information about transportation services to provide for pt/pt son during her appointment. I will let RN Lonn Georgia know that  pt has ride for [facility] appointment.

## 2020-07-23 IMAGING — MR MR MRA HEAD W/O CM
1 series · 25 of 48 positions shown · non-contrast
Comparison: MRI today reported separately.

CLINICAL DATA: [AGE] female code stroke presentation. Slurred
speech and weakness. Scattered left MCA cortical infarcts and subtle
left MCA/ACA watershed involvement on MRI today.

EXAM:
MRA HEAD WITHOUT CONTRAST
TECHNIQUE: Angiographic images of the Circle of Willis were obtained using MRA
technique without intravenous contrast.

[Series 9: 3d cow · axial · 0.5mm · 0.41mm/px · z∈[-32,+59]mm · 25 of 208 slices shown]
[im 1/208]
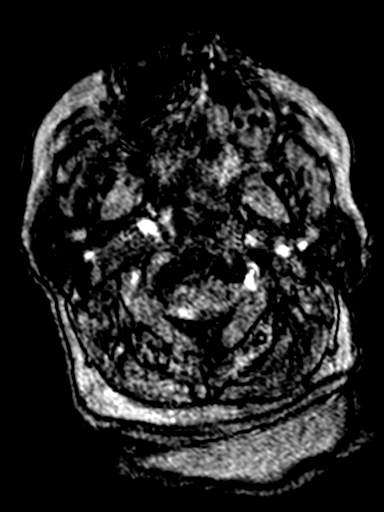
[im 5/208]
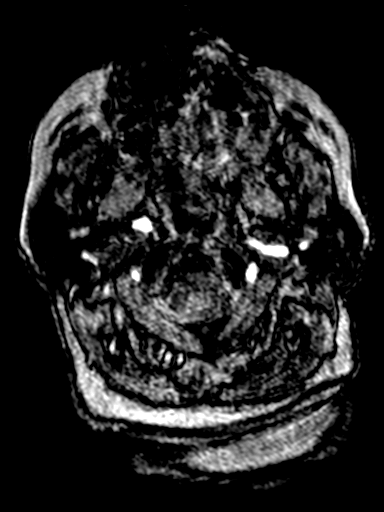
[im 9/208]
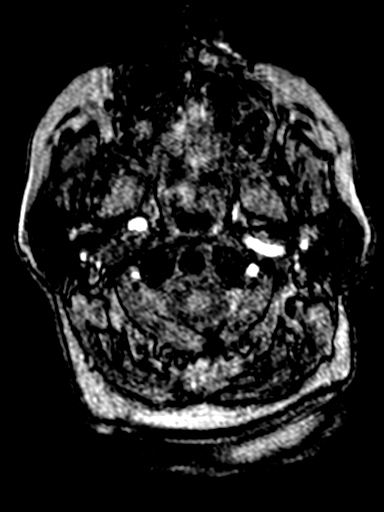
[im 14/208]
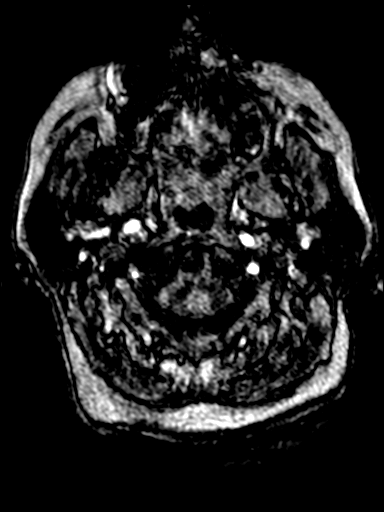
[im 18/208]
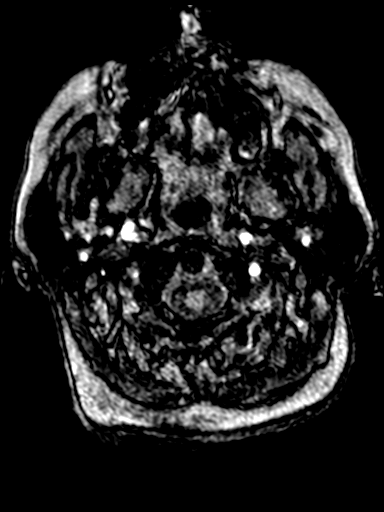
[im 23/208]
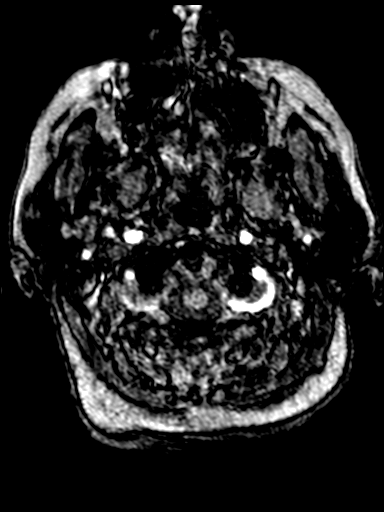
[im 27/208]
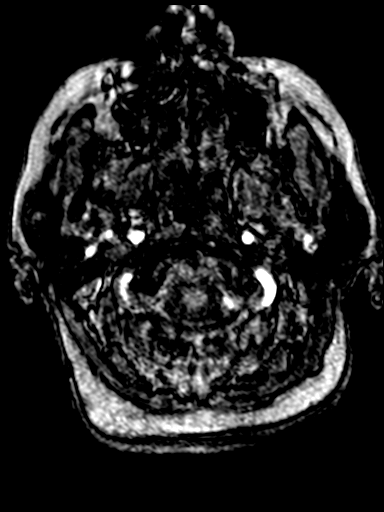
[im 31/208]
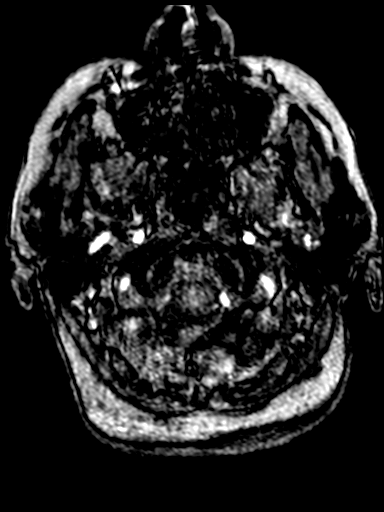
[im 36/208]
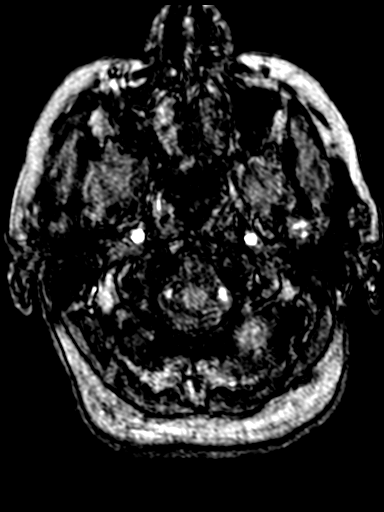
[im 40/208]
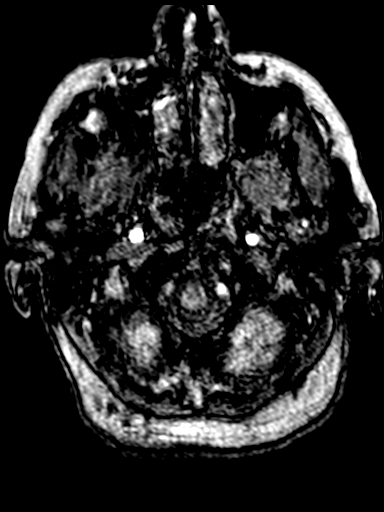
[im 45/208]
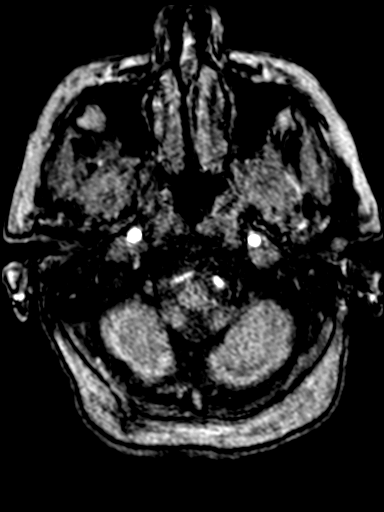
[im 49/208]
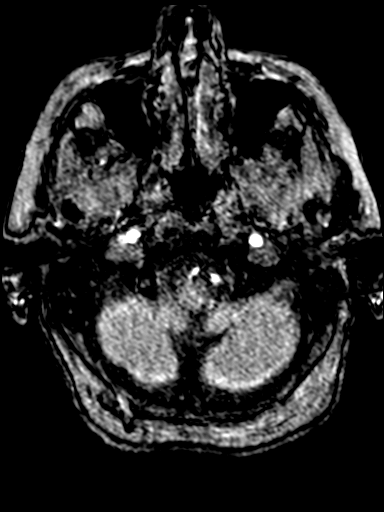
[im 53/208]
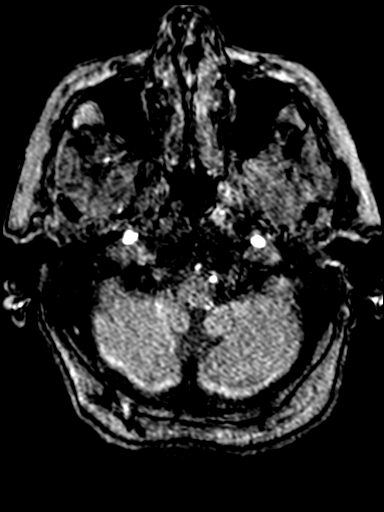
[im 58/208]
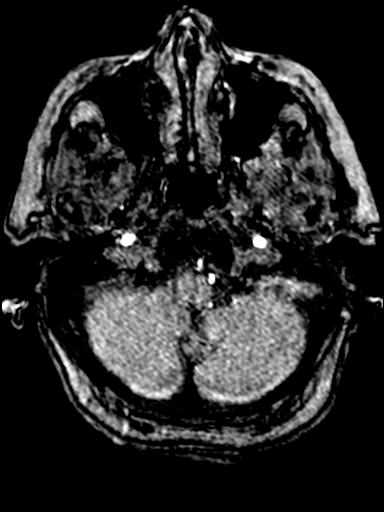
[im 62/208]
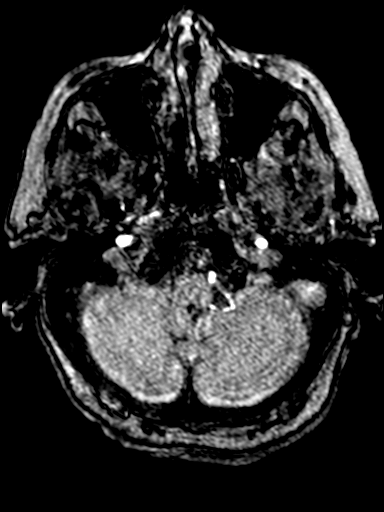
[im 67/208]
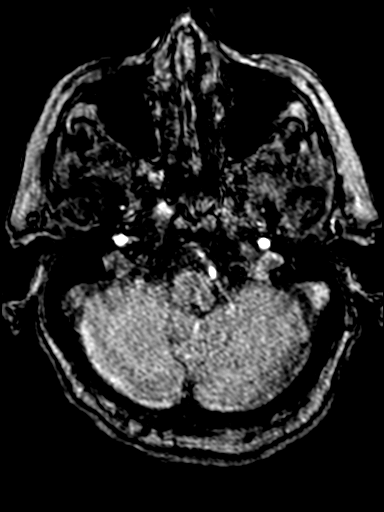
[im 71/208]
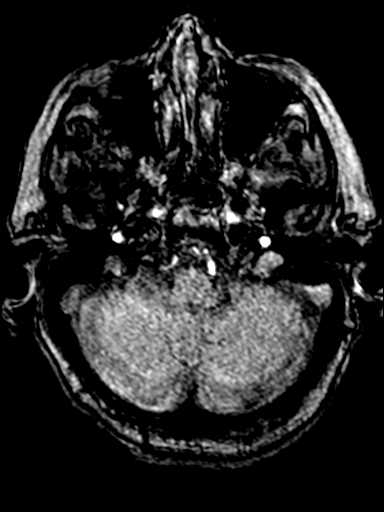
[im 75/208]
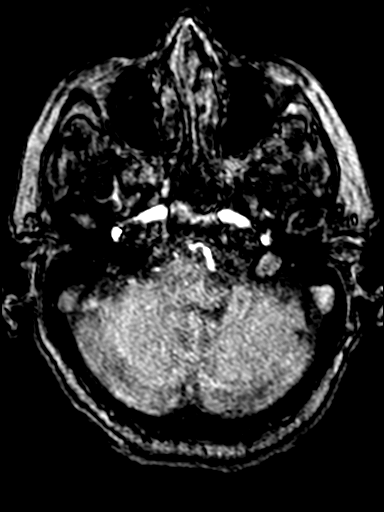
[im 93/208]
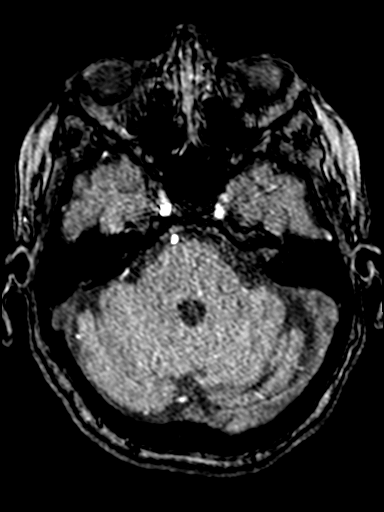
[im 106/208]
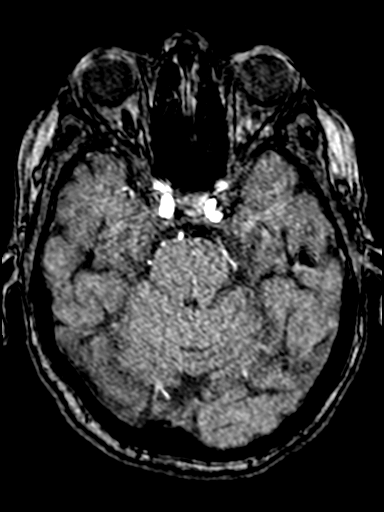
[im 119/208]
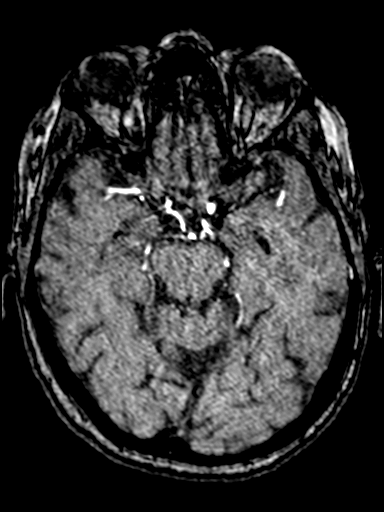
[im 146/208]
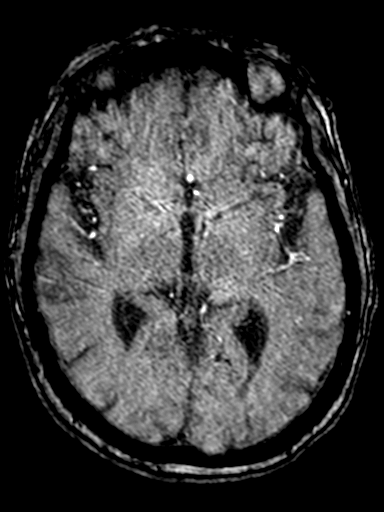
[im 172/208]
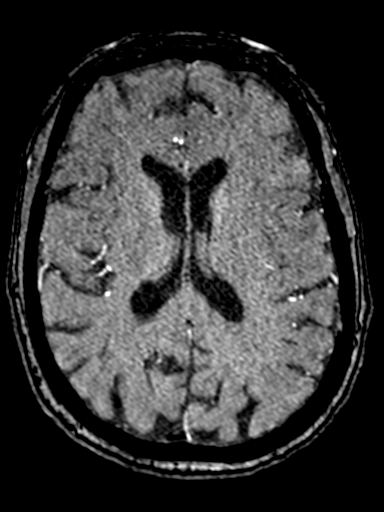
[im 177/208]
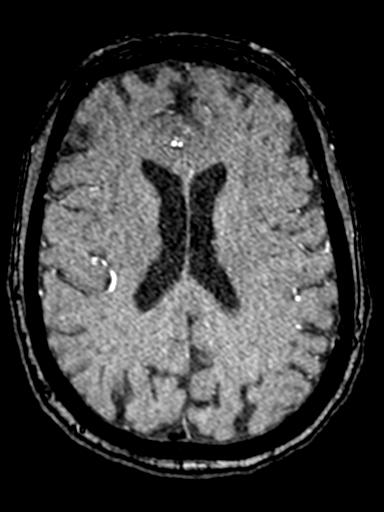
[im 199/208]
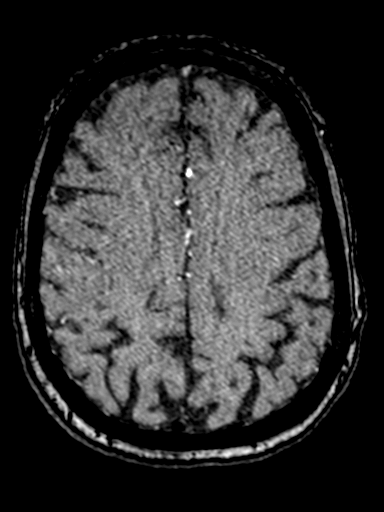

[25 of 48 positions shown; findings below may reference images not displayed]

FINDINGS: Mildly degraded by motion artifact despite repeated imaging
attempts.

Antegrade flow in the posterior circulation with dominant appearing
left vertebral artery and extensive irregularity and stenosis of the
distal right vertebral artery. Tandem high-grade right V4 stenoses,
but the right vertebrobasilar junction is patent. There is a mild to
moderate stenosis also at the left vertebrobasilar junction.
Irregular and somewhat diminutive basilar artery. No high-grade
basilar stenosis. Patent basilar tip. Fetal type right PCA origin.
Tortuous left P1 segment. Bilateral PCA branches are mildly to
moderately irregular, including both P2 segments.

Antegrade flow in both ICA siphons. Bilateral siphon irregularity in
keeping with atherosclerosis but there is asymmetric signal loss in
the left supraclinoid ICA (series 2074, image 14) suggesting
high-grade stenosis there.

Carotid termini remain patent. MCA and ACA origins are patent.
Anterior communicating artery and proximal A2 segments appear
normal. The visible left ACA remains patent, with only mild left A2
stenosis. Right MCA bifurcates early without stenosis. No right MCA
branch occlusion identified.

Left MCA M1 and left MCA bifurcation appear patent without
convincing stenosis. No left MCA branch occlusion is evident.
IMPRESSION: 1. Negative for large vessel occlusion, but positive for extensive
intracranial atherosclerosis:

2. Suspected High-grade distal Left ICA stenosis in the supraclinoid
segment.
But no significant Left MCA or ACA stenosis or branch occlusion is
identified.

3. Severely stenotic non dominant distal Right Vertebral Artery,
with up to moderate stenosis at the junction of the dominant Left
Vertebrobasilar junction.

## 2020-07-26 ENCOUNTER — Ambulatory Visit: Payer: Medicare HMO | Admitting: Cardiology

## 2020-07-26 ENCOUNTER — Other Ambulatory Visit: Payer: Self-pay

## 2020-07-26 ENCOUNTER — Encounter: Payer: Self-pay | Admitting: Cardiology

## 2020-07-26 VITALS — BP 143/85 | HR 107

## 2020-07-26 DIAGNOSIS — I4821 Permanent atrial fibrillation: Secondary | ICD-10-CM | POA: Diagnosis not present

## 2020-07-26 DIAGNOSIS — I5032 Chronic diastolic (congestive) heart failure: Secondary | ICD-10-CM | POA: Diagnosis not present

## 2020-07-26 DIAGNOSIS — Z8673 Personal history of transient ischemic attack (TIA), and cerebral infarction without residual deficits: Secondary | ICD-10-CM

## 2020-07-26 DIAGNOSIS — I10 Essential (primary) hypertension: Secondary | ICD-10-CM

## 2020-07-26 DIAGNOSIS — J984 Other disorders of lung: Secondary | ICD-10-CM

## 2020-07-26 DIAGNOSIS — I739 Peripheral vascular disease, unspecified: Secondary | ICD-10-CM

## 2020-07-26 DIAGNOSIS — T462X5A Adverse effect of other antidysrhythmic drugs, initial encounter: Secondary | ICD-10-CM | POA: Diagnosis not present

## 2020-07-26 MED ORDER — FUROSEMIDE 40 MG PO TABS: 40.0000 mg | ORAL_TABLET | Freq: Every day | ORAL | 11 refills | Status: AC

## 2020-07-26 NOTE — Patient Instructions (Signed)
Medication Instructions:   -May take additional lasix 40mg  every other day for increased weight.  -On days that you take an additional lasix you will take an additional potassium chloride 20mg .  -Carvedilol (Coreg) 25mg  twice daily. If you are feeling dizzy after taking this medication you may skip the next dose.    *If you need a refill on your cardiac medications before your next appointment, please call your pharmacy*   Follow-Up: At [facility], you and your health needs are our priority.  As part of our continuing mission to provide you with exceptional heart care, we have created designated Provider Care Teams.  These Care Teams include your primary Cardiologist (physician) and Advanced Practice Providers (APPs -  Physician Assistants and Nurse Practitioners) who all work together to provide you with the care you need, when you need it.  We recommend signing up for the patient portal called "MyChart".  Sign up information is provided on this After Visit Summary.  MyChart is used to connect with patients for Virtual Visits (Telemedicine).  Patients are able to view lab/test results, encounter notes, upcoming appointments, etc.  Non-urgent messages can be sent to your provider as well.   To learn more about what you can do with MyChart, go to [email or web address].    Your next appointment:   4 month(s)  The format for your next appointment:   In Person  Provider:   Glenetta Hew, MD
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
## 2020-07-27 DIAGNOSIS — K449 Diaphragmatic hernia without obstruction or gangrene: Secondary | ICD-10-CM | POA: Diagnosis not present

## 2020-07-27 DIAGNOSIS — I69351 Hemiplegia and hemiparesis following cerebral infarction affecting right dominant side: Secondary | ICD-10-CM | POA: Diagnosis not present

## 2020-07-27 DIAGNOSIS — F410 Panic disorder [episodic paroxysmal anxiety] without agoraphobia: Secondary | ICD-10-CM | POA: Diagnosis not present

## 2020-07-27 DIAGNOSIS — I739 Peripheral vascular disease, unspecified: Secondary | ICD-10-CM | POA: Diagnosis not present

## 2020-07-27 DIAGNOSIS — K219 Gastro-esophageal reflux disease without esophagitis: Secondary | ICD-10-CM | POA: Diagnosis not present

## 2020-07-27 DIAGNOSIS — I5032 Chronic diastolic (congestive) heart failure: Secondary | ICD-10-CM | POA: Diagnosis not present

## 2020-07-27 DIAGNOSIS — I110 Hypertensive heart disease with heart failure: Secondary | ICD-10-CM | POA: Diagnosis not present

## 2020-07-27 DIAGNOSIS — I69318 Other symptoms and signs involving cognitive functions following cerebral infarction: Secondary | ICD-10-CM | POA: Diagnosis not present

## 2020-07-27 DIAGNOSIS — I4891 Unspecified atrial fibrillation: Secondary | ICD-10-CM | POA: Diagnosis not present

## 2020-07-28 ENCOUNTER — Telehealth: Payer: Self-pay | Admitting: Licensed Clinical Social Worker

## 2020-07-28 NOTE — Telephone Encounter (Addendum)
LCSW spoke with pt son today at [phone], had been unable to touch base during pt appointment on 2/7. Introduced self, role, reason for call. Pt son shares that it has become tough to balance pt care and his job- he has missed quite a lot of work due to not having care at home. Pt had previously hired support but as previously discussed with this Probation officer it was not a good experience, son also feels this way.   I offered assistance with pt transportation, pt could get Transportation Services to take her to her appointments with [facility] providers. Pt son interested, he works in [employer] so it could be helpful for pt to have rides here and to home so that he could meet her here any not miss work.   I also offered to mail pt son information about private care agencies that may cover the [place] area. Pt son states interest. I will put these in the mail today and send referral to Transportation Services for pt.   Mailed the private care agency list, I also mailed information regarding [facility] StayWell Program w/ [facility] that pt may be eligible for. I have also mailed them my card and Transportation Services card should any additional questions arise.   Westley Hummer, MSW, [place]  [phone]

## 2020-07-29 DIAGNOSIS — F410 Panic disorder [episodic paroxysmal anxiety] without agoraphobia: Secondary | ICD-10-CM | POA: Diagnosis not present

## 2020-07-29 DIAGNOSIS — K449 Diaphragmatic hernia without obstruction or gangrene: Secondary | ICD-10-CM | POA: Diagnosis not present

## 2020-07-29 DIAGNOSIS — I739 Peripheral vascular disease, unspecified: Secondary | ICD-10-CM | POA: Diagnosis not present

## 2020-07-29 DIAGNOSIS — I69318 Other symptoms and signs involving cognitive functions following cerebral infarction: Secondary | ICD-10-CM | POA: Diagnosis not present

## 2020-07-29 DIAGNOSIS — I110 Hypertensive heart disease with heart failure: Secondary | ICD-10-CM | POA: Diagnosis not present

## 2020-07-29 DIAGNOSIS — I5032 Chronic diastolic (congestive) heart failure: Secondary | ICD-10-CM | POA: Diagnosis not present

## 2020-07-29 DIAGNOSIS — K219 Gastro-esophageal reflux disease without esophagitis: Secondary | ICD-10-CM | POA: Diagnosis not present

## 2020-07-29 DIAGNOSIS — I69351 Hemiplegia and hemiparesis following cerebral infarction affecting right dominant side: Secondary | ICD-10-CM | POA: Diagnosis not present

## 2020-07-29 DIAGNOSIS — I4891 Unspecified atrial fibrillation: Secondary | ICD-10-CM | POA: Diagnosis not present

## 2020-07-30 DIAGNOSIS — Z4682 Encounter for fitting and adjustment of non-vascular catheter: Secondary | ICD-10-CM | POA: Diagnosis not present

## 2020-07-30 DIAGNOSIS — I672 Cerebral atherosclerosis: Secondary | ICD-10-CM | POA: Diagnosis not present

## 2020-07-30 DIAGNOSIS — I4510 Unspecified right bundle-branch block: Secondary | ICD-10-CM | POA: Diagnosis not present

## 2020-07-30 DIAGNOSIS — Z452 Encounter for adjustment and management of vascular access device: Secondary | ICD-10-CM | POA: Diagnosis not present

## 2020-07-30 DIAGNOSIS — R404 Transient alteration of awareness: Secondary | ICD-10-CM | POA: Diagnosis not present

## 2020-07-30 DIAGNOSIS — J9383 Other pneumothorax: Secondary | ICD-10-CM | POA: Diagnosis not present

## 2020-07-30 DIAGNOSIS — I5032 Chronic diastolic (congestive) heart failure: Secondary | ICD-10-CM | POA: Diagnosis not present

## 2020-07-30 DIAGNOSIS — J811 Chronic pulmonary edema: Secondary | ICD-10-CM | POA: Diagnosis not present

## 2020-07-30 DIAGNOSIS — F410 Panic disorder [episodic paroxysmal anxiety] without agoraphobia: Secondary | ICD-10-CM | POA: Diagnosis not present

## 2020-07-30 DIAGNOSIS — S2232XA Fracture of one rib, left side, initial encounter for closed fracture: Secondary | ICD-10-CM | POA: Diagnosis not present

## 2020-07-30 DIAGNOSIS — K219 Gastro-esophageal reflux disease without esophagitis: Secondary | ICD-10-CM | POA: Diagnosis not present

## 2020-07-30 DIAGNOSIS — R251 Tremor, unspecified: Secondary | ICD-10-CM | POA: Diagnosis not present

## 2020-07-30 DIAGNOSIS — I4891 Unspecified atrial fibrillation: Secondary | ICD-10-CM | POA: Diagnosis not present

## 2020-07-30 DIAGNOSIS — I69351 Hemiplegia and hemiparesis following cerebral infarction affecting right dominant side: Secondary | ICD-10-CM | POA: Diagnosis not present

## 2020-07-30 DIAGNOSIS — I69318 Other symptoms and signs involving cognitive functions following cerebral infarction: Secondary | ICD-10-CM | POA: Diagnosis not present

## 2020-07-30 DIAGNOSIS — I499 Cardiac arrhythmia, unspecified: Secondary | ICD-10-CM | POA: Diagnosis not present

## 2020-07-30 DIAGNOSIS — I517 Cardiomegaly: Secondary | ICD-10-CM | POA: Diagnosis not present

## 2020-07-30 DIAGNOSIS — R55 Syncope and collapse: Secondary | ICD-10-CM | POA: Diagnosis not present

## 2020-07-30 DIAGNOSIS — I10 Essential (primary) hypertension: Secondary | ICD-10-CM | POA: Diagnosis not present

## 2020-07-30 DIAGNOSIS — I739 Peripheral vascular disease, unspecified: Secondary | ICD-10-CM | POA: Diagnosis not present

## 2020-07-30 DIAGNOSIS — I110 Hypertensive heart disease with heart failure: Secondary | ICD-10-CM | POA: Diagnosis not present

## 2020-07-30 DIAGNOSIS — K4090 Unilateral inguinal hernia, without obstruction or gangrene, not specified as recurrent: Secondary | ICD-10-CM | POA: Diagnosis not present

## 2020-07-30 DIAGNOSIS — K449 Diaphragmatic hernia without obstruction or gangrene: Secondary | ICD-10-CM | POA: Diagnosis not present

## 2020-07-30 DIAGNOSIS — R569 Unspecified convulsions: Secondary | ICD-10-CM | POA: Diagnosis not present

## 2020-07-31 DIAGNOSIS — I672 Cerebral atherosclerosis: Secondary | ICD-10-CM | POA: Diagnosis not present

## 2020-07-31 DIAGNOSIS — J90 Pleural effusion, not elsewhere classified: Secondary | ICD-10-CM | POA: Diagnosis not present

## 2020-07-31 DIAGNOSIS — J9383 Other pneumothorax: Secondary | ICD-10-CM | POA: Diagnosis not present

## 2020-07-31 DIAGNOSIS — I4891 Unspecified atrial fibrillation: Secondary | ICD-10-CM | POA: Diagnosis not present

## 2020-07-31 DIAGNOSIS — J9601 Acute respiratory failure with hypoxia: Secondary | ICD-10-CM | POA: Diagnosis not present

## 2020-07-31 DIAGNOSIS — K4090 Unilateral inguinal hernia, without obstruction or gangrene, not specified as recurrent: Secondary | ICD-10-CM | POA: Diagnosis not present

## 2020-07-31 DIAGNOSIS — J9811 Atelectasis: Secondary | ICD-10-CM | POA: Diagnosis not present

## 2020-07-31 DIAGNOSIS — I10 Essential (primary) hypertension: Secondary | ICD-10-CM | POA: Diagnosis not present

## 2020-07-31 DIAGNOSIS — I517 Cardiomegaly: Secondary | ICD-10-CM | POA: Diagnosis not present

## 2020-07-31 DIAGNOSIS — S2243XA Multiple fractures of ribs, bilateral, initial encounter for closed fracture: Secondary | ICD-10-CM | POA: Diagnosis not present

## 2020-07-31 DIAGNOSIS — R55 Syncope and collapse: Secondary | ICD-10-CM | POA: Diagnosis not present

## 2020-07-31 DIAGNOSIS — R251 Tremor, unspecified: Secondary | ICD-10-CM | POA: Diagnosis not present

## 2020-07-31 DIAGNOSIS — J811 Chronic pulmonary edema: Secondary | ICD-10-CM | POA: Diagnosis not present

## 2020-07-31 DIAGNOSIS — R0902 Hypoxemia: Secondary | ICD-10-CM | POA: Diagnosis not present

## 2020-07-31 DIAGNOSIS — Z452 Encounter for adjustment and management of vascular access device: Secondary | ICD-10-CM | POA: Diagnosis not present

## 2020-07-31 DIAGNOSIS — S2232XA Fracture of one rib, left side, initial encounter for closed fracture: Secondary | ICD-10-CM | POA: Diagnosis not present

## 2020-07-31 DIAGNOSIS — Z4682 Encounter for fitting and adjustment of non-vascular catheter: Secondary | ICD-10-CM | POA: Diagnosis not present

## 2020-07-31 DIAGNOSIS — J189 Pneumonia, unspecified organism: Secondary | ICD-10-CM | POA: Diagnosis not present

## 2020-07-31 DIAGNOSIS — S2222XA Fracture of body of sternum, initial encounter for closed fracture: Secondary | ICD-10-CM | POA: Diagnosis not present

## 2020-07-31 DIAGNOSIS — C7951 Secondary malignant neoplasm of bone: Secondary | ICD-10-CM | POA: Diagnosis not present

## 2020-08-03 ENCOUNTER — Other Ambulatory Visit: Payer: Self-pay

## 2020-08-03 NOTE — Patient Outreach (Signed)
[facility]) Care Management  08/03/2020  [name] 05-Sep-1925 [id number]     Transition of Care Referral  Referral Date: 08/03/2020 Referral Source: [facility] Discharge Report Date of Discharge: 08-28-2020 Facility: [place] Medicare    Referral received. Per Patient Sara [name] and EMR patient last discharged from SNF in Oct 2021. No outreach warranted at this time.     Plan: RN CM will close referral.    Enzo Montgomery, RN,BSN,CCM [facility] Management Telephonic Care Management Coordinator Direct Phone: [phone] Toll Free: [phone] Fax: [phone]

## 2020-08-09 ENCOUNTER — Encounter: Payer: Self-pay | Admitting: Cardiology

## 2020-08-09 NOTE — Assessment & Plan Note (Signed)
Unfortunately no longer able to use amiodarone.  Mild positional pulmonary fibrosis.

## 2020-08-09 NOTE — Assessment & Plan Note (Signed)
Sara [name] communicative exam.  He is taking furosemide off and on.  I recommend that she takes at least 3 to 4 days a week.  With additional doses, she should take additional potassium.

## 2020-08-09 NOTE — Assessment & Plan Note (Signed)
Very difficult to rate control.  At this point she is relatively asymptomatic we will simply continue with carvedilol and low-dose diltiazem for rate control.  The diltiazem was for PRN tachycardia. She is on Eliquis with no bleeding issues.

## 2020-08-09 NOTE — Assessment & Plan Note (Signed)
Now on Eliquis.  He is quite debilitated now with signs of Corlanor almost failure to thrive.  Encourage her to continue working with physical therapy routine.

## 2020-08-11 ENCOUNTER — Ambulatory Visit: Payer: Medicare HMO | Admitting: Cardiology

## 2020-08-17 DEATH — deceased

## 2020-12-06 ENCOUNTER — Ambulatory Visit: Payer: Medicare HMO | Admitting: Cardiology
